# Patient Record
Sex: Male | Born: 1955 | Race: White | Hispanic: No | Smoking: Former smoker
Health system: Southern US, Community
[De-identification: ages and names within clinical notes are randomized; demographics above are authoritative.]

## PROBLEM LIST (undated history)

## (undated) DIAGNOSIS — K219 Gastro-esophageal reflux disease without esophagitis: Secondary | ICD-10-CM

## (undated) DIAGNOSIS — IMO0001 Reserved for inherently not codable concepts without codable children: Secondary | ICD-10-CM

## (undated) DIAGNOSIS — R6521 Severe sepsis with septic shock: Secondary | ICD-10-CM

## (undated) DIAGNOSIS — I509 Heart failure, unspecified: Secondary | ICD-10-CM

## (undated) DIAGNOSIS — I482 Chronic atrial fibrillation, unspecified: Secondary | ICD-10-CM

## (undated) DIAGNOSIS — A419 Sepsis, unspecified organism: Secondary | ICD-10-CM

## (undated) DIAGNOSIS — I10 Essential (primary) hypertension: Secondary | ICD-10-CM

## (undated) HISTORY — PX: TONSILECTOMY, ADENOIDECTOMY, BILATERAL MYRINGOTOMY AND TUBES: SHX2538

## (undated) HISTORY — DX: Essential (primary) hypertension: I10

## (undated) HISTORY — DX: Chronic atrial fibrillation, unspecified: I48.20

## (undated) HISTORY — PX: KNEE ARTHROSCOPY: SUR90

## (undated) HISTORY — PX: SPLENECTOMY: SUR1306

## (undated) HISTORY — PX: HERNIA REPAIR: SHX51

## (undated) HISTORY — DX: Gastro-esophageal reflux disease without esophagitis: K21.9

---

## 1999-12-18 ENCOUNTER — Ambulatory Visit (HOSPITAL_COMMUNITY): Admission: RE | Admit: 1999-12-18 | Discharge: 1999-12-19 | Payer: Self-pay | Admitting: Otolaryngology

## 2000-02-27 ENCOUNTER — Ambulatory Visit: Admission: RE | Admit: 2000-02-27 | Discharge: 2000-02-27 | Payer: Self-pay | Admitting: Otolaryngology

## 2003-07-02 ENCOUNTER — Encounter: Payer: Self-pay | Admitting: Orthopedic Surgery

## 2003-07-02 ENCOUNTER — Ambulatory Visit (HOSPITAL_COMMUNITY): Admission: RE | Admit: 2003-07-02 | Discharge: 2003-07-02 | Payer: Self-pay | Admitting: Orthopedic Surgery

## 2003-07-06 ENCOUNTER — Ambulatory Visit (HOSPITAL_COMMUNITY): Admission: RE | Admit: 2003-07-06 | Discharge: 2003-07-06 | Payer: Self-pay | Admitting: Orthopedic Surgery

## 2003-07-06 ENCOUNTER — Encounter: Payer: Self-pay | Admitting: Orthopedic Surgery

## 2005-03-20 ENCOUNTER — Encounter: Admission: RE | Admit: 2005-03-20 | Discharge: 2005-03-20 | Payer: Self-pay | Admitting: Internal Medicine

## 2008-09-11 ENCOUNTER — Encounter (HOSPITAL_BASED_OUTPATIENT_CLINIC_OR_DEPARTMENT_OTHER): Admission: RE | Admit: 2008-09-11 | Discharge: 2008-11-06 | Payer: Self-pay | Admitting: Internal Medicine

## 2009-03-28 ENCOUNTER — Encounter (HOSPITAL_BASED_OUTPATIENT_CLINIC_OR_DEPARTMENT_OTHER): Admission: RE | Admit: 2009-03-28 | Discharge: 2009-05-01 | Payer: Self-pay | Admitting: Internal Medicine

## 2010-10-20 ENCOUNTER — Encounter: Payer: Self-pay | Admitting: Internal Medicine

## 2011-02-11 NOTE — Assessment & Plan Note (Signed)
[facility]   NAME:  Matthew [name], Matthew [name]             ACCOUNT NO.:  [id number]   MEDICAL RECORD NO.:  [id number]      DATE OF BIRTH:  10/15/1955   PHYSICIAN:  Maxwell Caul, M.D. VISIT DATE:  10/12/2008                                   OFFICE VISIT   Mr. Shelp is a gentleman that we have been following for the last  month.  He has a surgical area on the lateral aspect of his right foot  several years ago which apparently was some form of lipoma.  He has  developed some increase in this area recently, which is a very difficult  area as he continues to traumatize this on his footwear.  In any case,  we have had him in a healing sandal with felt offloading strips.  Serial  debridements have resulted in resolution of the wound part of this,  however, he still has a fairly significant deformity.  He also has on  the lateral aspect of his left foot a continued callus area as well.   On examination, he is afebrile.  The area on the lateral and plantar  aspect of his right foot continues to be a source of callus formation  with some subungual hemorrhage.  Nevertheless, after removal of the  callus, there is no open wound here.  Nor on the left side is there any  open wounds remaining.   IMPRESSION:  Pressure ulcer relation related to pressure and friction  over a soft tissue abnormality in his right greater than left foot.  At  this point, I think this boils down to a pressure relief area issue.  I  have written a prescription for custom inserts, although I know the  patient is between jobs and may not be able to afford this.  Other than  offloading, this would felt keeping the area covered with a thickened  adhesive bandage such as Allevyn.  I cannot think of way to prevent the  friction which is no doubt causing this difficult area.  I did write his  prescription today.  At this point, I can see no reason to follow him  here, therefore, I have discharged him.  I am  concerned that if we  cannot properly offload this area, he will have a recurrent problem.           ______________________________  Maxwell Caul, M.D.     MGR/MEDQ  D:  10/12/2008  T:  10/12/2008  Job:  [id number]

## 2011-02-11 NOTE — Assessment & Plan Note (Signed)
[facility]   NAME:  Matthew [name], Matthew [name]             ACCOUNT NO.:  [id number]   MEDICAL RECORD NO.:  [id number]      DATE OF BIRTH:  08/12/1956   PHYSICIAN:  Maxwell Caul, M.D. VISIT DATE:  03/29/2009                                   OFFICE VISIT   HISTORY OF PRESENT ILLNESS:  Mr. Narramore is a 55 year old man who was  initially seen here at the end of 2009.  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  At that point in time, he had been referred by Dr.  Leslee Home for a nonhealing area on the lateral aspect of his right foot  which Dr. Leslee Home had used the term angiolipoma.  In his last round  of his vacations in December 2009 to January 2010, we removed a large  amount of callus from the lateral aspect of his right foot.  The area  responded to simple offloading and really healed over and he was  discharged, I believe in January.  At the time, he had no insurance and  was unemployed, therefore he could not get custom foot wear or custom  inserts.  He is not a diabetic.   He returns today telling me that, roughly 2 months ago, the area in  question started to develop a thickening callus and some drainage but no  pain and he returns today to have our review on this.   PHYSICAL EXAMINATION:  The right lateral plantar aspect of his foot once  again there was a large amount of callus with even a horned appearance  to it.  All of this underwent removal.  After removal of several areas  of thick callus, there is a very small superficial open area and there  was no evidence of infection here.  On the left lateral foot just  proximal to his fifth met head on the plantar aspect was also a thick  area of callus that I removed but there was no open area under this.   IMPRESSION:  Recurrent area of callus/wound secondary to an underlying  defect, prior history of a lipoma removal but this is always  felt as  though there may be a bony abnormality underneath this.  We advised him  to apply Polysporin in the area and use a healing sandal.  This healed  very easily last time.  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  We advised him to offload in his own shoes in  that case.  I have written him another prescription for custom footwear  and/or custom inserts as the technician sees fit to offload this area  when he is up.  I think referring him to Podiatry might be the best  option to see if there is a surgical option for pairing back the  underlying bony deformity which I think exists in his foot or not there  is an underlying lipoma here.  We will see him again in a week for a  quick visit.           ______________________________  Maxwell Caul, M.D.     MGR/MEDQ  D:  03/29/2009  T:  03/29/2009  Job:  [id number]

## 2011-02-11 NOTE — Assessment & Plan Note (Signed)
[facility]   NAME:  Matthew [name], Matthew [name] NO.:  [id number]   MEDICAL RECORD NO.:  [id number]      DATE OF BIRTH:  1955-11-13   PHYSICIAN:  Maxwell Caul, M.D. VISIT DATE:  04/19/2009                                   OFFICE VISIT   HISTORY:  Mr. Zeck is a gentleman who I saw for the first time early  this month, although I had seen him for the same problem in 2009.  He  has a history of a deformity on the right foot with surgery for a benign  tumor roughly 3-4 years ago.  I have also felt he has an underlying bony  deformity in the right plantar foot aspect on the lateral side.  He  presents with a large amount of callus and a wound under this which  responds to debridement of the callus simple offloading and these areas  generally will heal.  On this occasion, it has really been the same as  the first time.   PHYSICAL EXAMINATION:  He is afebrile.  The area of wound is completely  resolved.  There is indeed some soft tissue swelling (history of prior  of lipoma in this area).  However, the area is now completely  epithelialized and resolved.   IMPRESSION:  Recurrent pressure ulcers over the soft tissue and perhaps  bony deformity on the lateral aspect of his right foot.  I have written  him a prescription for diabetic shoes and custom inserts.  He has not  yet obtained these.  I have also suggested a orthopedic surgery  appointment with somebody who has an interest in foot deformities and  apparently he knows Dr. Lajoyce Corners personally and that seemed to be a good  root for this.  Other than that, the wound in this area which is  recurrent has resolved.  We have discussed pressure offloading etc.           ______________________________  Maxwell Caul, M.D.     MGR/MEDQ  D:  04/19/2009  T:  04/20/2009  Job:  [id number]

## 2011-02-11 NOTE — Assessment & Plan Note (Signed)
[facility]   NAME:  Matthew [name], Matthew [name] NO.:  [id number]   MEDICAL RECORD NO.:  [id number]      DATE OF BIRTH:  [birth year]   PHYSICIAN:  Maxwell Caul, M.D. VISIT DATE:  09/28/2008                                   OFFICE VISIT   Matthew [name] is a gentleman who we saw on September 14, 2008, as a new  patient.  He has had a surgical area on lateral aspect of his right foot  3 years ago for what the patient thinks was a benign tumor.  Three  months ago, he developed an opening in this area.  He was initially seen  by Dr. Simonne Come.  Debridement of the area showed a large cavitation in  the wound.  The area was cultured.  Crystal analysis showed  pyrophosphate crystals.  Anti-inflammatories actually made the area feel  somewhat better.  The culture grew rare staph aureus.   When we saw this last week, there was a large amount of redundant soft  tissue callus and soft tissue that was removed.  There was still a small  open area, which I think was the remnant of the original wound dealt  with by Dr. Simonne Come.  When I saw that, there was no evidence of  infection and really nothing needed culturing.  Plain x-rays had been  previously done by Dr. Simonne Come showed no bony involvement.   Actually, we applied hydrogel gauze to this, Telfa, and put him in the  healing sandal with self-offloading strips.  He has done well.  The area  feels better.   The area was debrided of some adherent callus.  Underneath, there was  full epithelialization except for 1 tiny area in the inferior recess of  the wound but even this looks healthy.  I also pared back a callus from  a similar area on his left foot.  There is no open wound here.   IMPRESSION:  Pressure ulceration related to pressure and friction over a  soft tissue abnormality in his right foot.  This has almost resolved.  I  continued with the hydrogel and a healing sandal, but I suspect this  will be  resolved by the time he gets here in the next 2 weeks.           ______________________________  Maxwell Caul, M.D.     MGR/MEDQ  D:  09/28/2008  T:  09/29/2008  Job:  [id number]

## 2011-02-11 NOTE — Assessment & Plan Note (Signed)
[facility]   NAME:  Matthew [name], Matthew [name]             ACCOUNT NO.:  [id number]   MEDICAL RECORD NO.:  [id number]      DATE OF BIRTH:  [birth year]   PHYSICIAN:  Maxwell Caul, M.D. VISIT DATE:  04/05/2009                                   OFFICE VISIT   HISTORY:  Matthew [name] is a gentleman I saw for the first time last week,  although I had seen him in 2009.  He has a history of a deformity on the  right foot for a benign tumor roughly 3-4 years ago.  I have also felt  he has an underlying bony deformity in the right foot plantar aspect on  the lateral side.  In Matthew last round of visits in 2009 to the early  parts of 2010, we had removed a large amount of callus from the lateral  aspect of Matthew foot over this deformity and the area responded to simple  offloading and really healed without too much difficulty.  At that time,  he had no insurance and was unemployed, therefore he could not get  custom footwear or custom inserts.  He is not a diabetic.   He returned last week having wounds open in the same area.  Again, he  had a large amount of thick callus and some mild drainage but no pain.  After extensive manual debridement of the callus, we are able to define  a very small wound dimension.  There was no evidence of infection.   He tells me that the wound itself feels better and it is less painful.  He has noted some swelling that he attributes to a possible allergic  reaction to the tape he was using.   PHYSICAL EXAMINATION:  He is afebrile.  Wound measures 0.7 x 0.9 x 0.1.  The whole area looks entirely better with simple offloading.  There is  indeed some swelling on the lateral aspect of Matthew foot but there is no  tenderness, no warmth, and no evidence of infection.  No cultures were  done.   IMPRESSION:  Recurrent pressure ulcers over a soft tissue and bony  prominence on the lateral aspect of Matthew right foot.  I have given him  prescriptions for custom-made  footwear which I think he will need to go  ahead and obtain.  I think ultimately he needs a consultation with  either an orthopedic surgeon who is interested in this type of  preventative surgery and/or a podiatrist to consider surgical options to  prevent recurrent ulcerations.  Of most eminent concern is that he is  taking Matthew [name] to [place] at the end of this month and will be on  Matthew feet for a large period of time.  I am hopeful he will be able to  get Matthew custom shoes to offload this area before he goes on this trip.  He apparently knows Dr. Lajoyce Corners personally who has done some surgery on him  previously on Matthew knee.  Therefore, that might be the best way to get a  surgeon who might be interested in looking at this deformity.  We will  see him again in 2 weeks.           ______________________________  Maxwell Caul, M.D.  MGR/MEDQ  D:  04/05/2009  T:  04/06/2009  Job:  [id number]

## 2011-02-11 NOTE — Assessment & Plan Note (Signed)
[facility]   NAME:  Matthew, [name]             ACCOUNT NO.:  [id number]   MEDICAL RECORD NO.:  [id number]      DATE OF BIRTH:  [birth year]   PHYSICIAN:  Maxwell Caul, M.D. VISIT DATE:  09/14/2008                                   OFFICE VISIT   Matthew [name] is a gentleman here referred by Dr. Simonne [name] for our review  of a nonhealing area on the lateral aspect of his right foot.   The patient tells me that he had foot surgery 3 years ago for a benign  tumor that was removed by a podiatrist some 3 years ago.  The wound  healed well and he really did not have any problems until roughly 2  years ago he started to develop the same swelling in the lateral aspect  of his right foot.  He did not go to see about it.  Two to three months  ago, he noted blood coming out of the area and he went to see his  primary doctor, Matthew [name] who referred him for Matthew [name]  review.   In Matthew [name] office, the callus was debrided down to a normal  skin.  A large cavitation was noted in the area; however, there was no  active bleeding and no spontaneous drainage.  The area was cultured and  was sent for crystals.  The crystal analysis showed calcium  pyrophosphate crystals.  He was put on anti-inflammatories and actually  the whole area actually feels somewhat better.  The culture of the wound  grew rare Staph aureus.   PAST MEDICAL HISTORY:  1. 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  3. Abdominal hernia repair.  4. Left wrist surgery secondary to a ganglion cyst.  5. Nasal septum repair.  6. Hypertension.  7. Sleep apnea.  8. Remote history of gout.  He is not a diabetic.   Current medications are reviewed.   ALLERGIES:  NEOMYCIN/NEOSPORIN.   Examination of the foot continues to reveal a large amount of callus on  the lateral aspect of the foot.  There was a moderate amount of  subungual  hemorrhage within the callus.  There is a soft, fleshy  deformity on the lateral aspect of the right foot.  I could not really  delineate a mass.  Peripheral pulses were intact bilaterally.  There was  no evidence of active arthritis in the foot.  No focal tenderness  related to pseudo gout.   The area was totally debrided of a large amount of redundant callus and  soft tissue.  After this, there is a small open area under the callus  which I am assuming is considerably better than what Dr. Simonne [name]  described on August 08, 2008.  There was no evidence of infection.  Nothing needed culturing.   IMPRESSION:  Pressure ulceration probably related to pressure and  friction within his footwear.  There is a deformity on his foot whether  this is the recurrence of his tumor or not I am uncertain.  However,  there is a deformity but it does not appear to be bony.  Plain x-rays  were done by Dr. Simonne [name] that showed no bony  involvement in this area  and no calcified deposits.  I think the area that is in question will  heal if the area is properly off-loaded.  Towards this end, I applied  hydrogel, Telfa and a healing sandal with felt offloading strips.  Whether he would be a candidate for any further foot surgery to correct  the deformity in this area, I am really uncertain.  I also manually  debrided in his left foot almost in a similar aspect.  I think this was  a callus, there was no open wound.   Overall, I am cautiously optimistic we can get this area to heal if the  pressure is removed.  He is short of funds currently, therefore,  Polysporin, a thick Band-Aid with our healing sandal would probably  suffice and cost him less than the wound care products we applied today.  We will see him again in 2 weeks.           ______________________________  Maxwell Caul, M.D.     MGR/MEDQ  D:  09/14/2008  T:  09/15/2008  Job:  [id number]

## 2011-02-14 NOTE — Op Note (Signed)
NAMENICCOLAS, LOEPER                       ACCOUNT NO.:  [id number]   MEDICAL RECORD NO.:  [id number]                   PATIENT TYPE:  OIB   LOCATION:  2873                                 FACILITY:  [facility]   PHYSICIAN:  Nadara Mustard, M.D.                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   POSTOPERATIVE DIAGNOSIS:  Degenerative medial meniscal tear left knee.  Osteochondral defect left medial femoral condyle.   PROCEDURE:  Left knee arthroscopy with debridement of osteochondral defect  medial femoral condyle with microfracture and partial medial meniscectomies.   SURGEON:  Nadara Mustard, M.D.   ANESTHESIA:  Knee block plus a limited LMA.   ESTIMATED BLOOD LOSS:  Minimal.   TOURNIQUET TIME:  None.   DISPOSITION:  To PACU in stable condition.   INDICATIONS FOR PROCEDURE:  The patient is a 55 year old gentleman with  mechanical symptoms and pain over the left knee medial joint line.  The  patient has failed conservative care including activity modifications,  injections, and presents at this time for arthroscopic intervention. MRI  scan confirmed an osteochondral defect, but the MRI scan did not show a  medial meniscal tear.  The risks and benefits of surgery were discussed with  the patient including infection, neurovascular injury, exacerbation of the  sleep apnea.  The patient states he understands and wishes to proceed at  this time.   DESCRIPTION OF PROCEDURE:  The patient was brought to OR room #3 after  undergoing a knee block.  After adequate levels of anesthesia was obtained,  the patient was placed on the operating table and his left lower extremity  was prepped using Duraprep and draped into a sterile field.  The scope was  inserted through the inferior lateral portal  and a working portal was  established inferomedially.  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 The patient had no osteochondral  defect of the patella and trochlea.  Visualization of the lateral joint line  showed no meniscal pathology and no pathology of the lateral femoral condyle  or lateral tibial plateau.  The meniscus was probed and was stable.  Visualization of the notch showed an intact ACL.  Visualization of the  medial joint line with valgus stress showed the patient had a very large  degenerative tear of the medial meniscus as well as an osteochondral defect  of the medial femoral condyle.  The medial meniscus was partially debrided  back to a stable edge.  The osteochondral defect of the medial femoral  condyle underwent abrasion chondroplasty with microfracture.  A survey was  performed of all compartments after arthroscopy.  There was no loose bodies  and no floating fragments.  The instruments were removed.  The portals were  injected with a total of 20 mL of 0.5% Marcaine plain with 4 mm of morphine.  The portals were closed using 2-0 nylon. The wounds were covered with  Adaptic, orthopedic sponges, sterile Coban.  The patient was extubated from  the LMA which was placed temporarily for pain control and he was  taken to PACU in stable condition.  Plan for discharge to home if the  patient has no difficulty with his oxygen saturations while in the recovery  room.  If the patient does, we will plan for a 23-hour observation.  The  patient is very resistant to 23-hour observation at this time.                                               Nadara Mustard, M.D.    MVD/MEDQ  D:  07/06/2003  T:  07/07/2003  Job:  [id number]

## 2011-02-14 NOTE — Op Note (Signed)
[place]  Patient:    TICO, CROTTEAU                      MRN: [id number] Proc. Date: 12/18/99 Adm. Date:  [id number] Attending:  Fernande Boyden CC:         Gloris Manchester. Lazarus Salines, M.D.             Barbette Hair. Vaughan Basta., M.D.             [facility].                           Operative Report  PREOPERATIVE DIAGNOSES:  Nasal septal deviation.  Hypertrophic inferior turbinates. Obstructive sleep apnea.  POSTOPERATIVE DIAGNOSES:  Nasal septal deviation.  Hypertrophic inferior turbinates. Obstructive sleep apnea.  PROCEDURES PERFORMED:  Septoplasty; bilateral submucous resections, inferior turbinates.  SURGEON:  Gloris Manchester. Lazarus Salines, M.D.  ANESTHESIA:  General orotracheal.  BLOOD LOSS:  Minimal.  COMPLICATIONS:  None.  FINDINGS:  Overall narrow nose, externally and internally, with a rightward septal deviation and moderately enlarged inferior turbinates.  Slight excoriation of the anterior septal mucosa including thinning along the prominent left maxillary crest spur.  DESCRIPTION OF PROCEDURE:  With the patient in a comfortable supine position, general orotracheal anesthesia was induced without difficulty.  At an appropriate level, the patient was placed in a slight beach chair position. Saline-moistened throat pack was placed.  Nasal vibrissae were trimmed.  Cocaine crystals, 200 mg total, were applied on cotton carriers to the anterior ethmoid and sphenopalatine ganglion regions on both sides.  Cocaine solution, 160 mg total, was applied on  1/2 x 3-inch cottonoids to both sides of the nasal septum.  Finally, 1% Xylocaine with 1:100,000 epinephrine, 8 cc total, were infiltrated in the submucoperichondrial plane of the septum on both sides, into the membranous columella, into the anterior floor of the nose and into the nasal spine region.  Approximately 10 minutes were allowed for hemostasis and anesthesia to  take effect. A sterile preparation and draping of the midface was accomplished.  Materials were removed from the nose and observed to be intact and correct in number.  The findings were as described above.  A left-sided hemitransfixion incision was sharply executed and carried down to the caudal edge of the quadrangular cartilage and continued through a floor incision.  A floor tunnel as elevated onto the vomer posteriorly and brought forward along the maxillary crest. Septal tunnel was elevated.  A linear rent was generated in the low septal tunnel where the mucosa was very thin overlying the maxillary crest spur.  The flap was carried back down to the perpendicular plate of the ethmoid, brought downward and communicated with the floor tunnel and then brought forward once again.  The chondro-ethmoid junction was identified and lysed with a caudal elevator and the opposite submucoperiosteal plane was elevated.  The superior perpendicular plate was lysed with an open Jansen-Middleton forceps.  The inferior portion was submucosally dissected and delivered with the caudal elevator, Jansen-Middleton  forceps and Takahashi forceps.  At this point, the septum was still off the maxillary crest towards the right.  A small floor incision was made on the right side of the nose and a floor tunnel elevated, carried back to the vomer and brought forward along the maxillary crest.  The posteroinferior aspect of the quadrangular cartilage was resected and a 1- to 2-mm strip along the  inferior edge where it as heavily spurred was also resected to allow it to trap-door into the midline. The maxillary crest posterior to the quadrangular cartilage was resected using mallet and osteotome.  At this point, the septum was straight and in the midline.  It as secured to the nasal spine region with a figure-of-eight 4-0 PDS suture. Hemostasis was observed.  The right septal flap was entirely intact at  the completion of the procedure.  A linear incision was made in the superior vault f the nose, submucosally on the left side and transmucosally on the right side, to free the septal cartilage from the upper lateral cartilages and allow it to align more straight in the sagittal plane; this completed the septoplasty.  The incisions were closed with interrupted 4-0 chromic gut.  The septum was quilted with a 4-0 plain gut, taking pains to reapproximate the linear rent on the left side.  Just prior to completing the septoplasty, the inferior turbinates were infiltrated with approximately 5 cc total of 1% Xylocaine with 1:100,000 epinephrine. Beginning on the right side, the anterior hood of the inferior turbinate was sharply lysed between the angled valve.  The median mucosa was incised in an anterior upsloping fashion.  The turbinate was in-fractured.  Using angled turbinate scissors, the turbinate bone and lateral mucosa were resected in a down-sloping fashion, taking virtually all the anterior pole and leaving virtually all the posterior pole.  Bony spicules were removed for more prompt healing. The flap was laid back down and suction-coagulated along its length.  The turbinate was out-fractured and this side was completed.  The left side was done in identical  fashion.  Reinforced Silastic splints of 0.040 were fashioned, placing to the nasal septum and secured there too with a 3-0 nylon suture.  Double-thickness Telfa packs coated with Neosporin ointment were placed into the inferior turbinate on each side. Finally, 6.5-mm nasal trumpets were shortened to go to the nasopharynx and were  placed on each side of the nose as part of the nasal packing.  Once again, hemostasis was observed.  At this point, the procedure was completed.  The pharynx was suctioned free and the throat pack was removed.  A drip pad was applied. The patient was returned to anesthesia, awakened,  extubated and transferred to recovery in stable condition.  COMMENT:  [age]-year-old white male with a long history of sleep apnea, worse  with advancing weight and with difficulty tolerating CPAP, at least in part because of very poor nasal airway, although the overall nose was narrow, and anticipate  that he will have improved airway following septoplasty and reduction of turbinates.  Meanwhile, we will use nasal hygiene measures, analgesia, antibiosis. Packs out in two days and nasal splints out in 10 days.  It will probably be two weeks or longer before he can successfully wear CPAP once again.  We will observe him overnight to make sure he has no anesthetic-related complications following  general anesthesia and surgery. DD:  12/18/99 TD:  12/19/99 Job: [id number]

## 2011-11-02 ENCOUNTER — Ambulatory Visit: Payer: Self-pay | Admitting: Physician Assistant

## 2011-11-02 VITALS — BP 130/83 | HR 83 | Temp 98.0°F | Resp 18 | Ht 69.75 in | Wt 366.2 lb

## 2011-11-02 DIAGNOSIS — J3489 Other specified disorders of nose and nasal sinuses: Secondary | ICD-10-CM

## 2011-11-02 DIAGNOSIS — H9209 Otalgia, unspecified ear: Secondary | ICD-10-CM

## 2011-11-02 DIAGNOSIS — H9203 Otalgia, bilateral: Secondary | ICD-10-CM

## 2011-11-02 DIAGNOSIS — I10 Essential (primary) hypertension: Secondary | ICD-10-CM | POA: Insufficient documentation

## 2011-11-02 DIAGNOSIS — I4891 Unspecified atrial fibrillation: Secondary | ICD-10-CM

## 2011-11-02 MED ORDER — IPRATROPIUM BROMIDE 0.06 % NA SOLN: 2.0000 | Freq: Four times a day (QID) | NASAL | Status: DC

## 2011-11-02 MED ORDER — DILTIAZEM HCL ER 240 MG PO CP24: 240.0000 mg | ORAL_CAPSULE | Freq: Every day | ORAL | Status: DC

## 2011-11-02 NOTE — Progress Notes (Signed)
  Subjective:    Patient ID: Matthew [name], male    DOB: 10/12/1955, 56 y.o.   MRN: [id number]  HPI  Matthew [name] presents today c/o sinus and ear pain x 1 week.  Has h/o chronic sinus trouble.  Has seen Kozlow in past but not for 3 years. Has had 2 courses of Amoxicillin since Nov 2012 with some relief.  Not sure if he has an infection at this point but feels like it's coming.  Pt requests refill on Diltiazem for A fib.  Insurance will be in effect in 2 months.  Had lost his job 4 years ago and now has new job. 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 Plans to schedule PE soon.    Review of Systems  Constitutional: Negative for fever and chills.  HENT: Positive for ear pain, nosebleeds, congestion, postnasal drip and sinus pressure. Negative for hearing loss, rhinorrhea, tinnitus and ear discharge.   Eyes: Negative.   Respiratory: Negative for cough, chest tightness and shortness of breath.   Cardiovascular: Positive for palpitations (rate controlled a fib). Negative for chest pain.  Musculoskeletal: Negative for myalgias.  Neurological: Negative for light-headedness and headaches.       Objective:   Physical Exam  Constitutional: Vital signs are normal. He appears well-developed and well-nourished.  HENT:  Right Ear: Tympanic membrane and ear canal normal.  Left Ear: Tympanic membrane and ear canal normal.  Nose: Mucosal edema present. No rhinorrhea.  Mouth/Throat: Oropharynx is clear and moist.  Cardiovascular: Normal rate.  A regularly irregular rhythm present.  Pulmonary/Chest: Effort normal.  Skin: Skin is warm.          Assessment & Plan:   1. Otalgia of both ears    2. Atrial fibrillation  diltiazem (DILACOR XR) 240 MG 24 hr capsule  3. Sinus pain  ipratropium (ATROVENT) 0.06 % nasal spray  4. HTN (hypertension)     Add mucinex, saline Nasal Sprays. Encouraged pt to start exercising by walking briskly for 5 min initially  and increase as tolerated. Plan CPE when insurance available!

## 2011-11-02 NOTE — Patient Instructions (Signed)
Take Mucinex as directed Use Atrovent Nasal Spray, saline nasal spray Start walking!!!!  Call if your sinus symptoms worsen.  We can consider writing another antibiotic. Plan to schedule a physical when you can.

## 2011-11-27 ENCOUNTER — Telehealth: Payer: Self-pay

## 2011-11-27 DIAGNOSIS — I4891 Unspecified atrial fibrillation: Secondary | ICD-10-CM

## 2011-11-27 NOTE — Telephone Encounter (Signed)
PT IS SCHEDULED TO SEE ALICIA ON 12/16/11 FOR A CPE. PT WILL RUN OUT OF MEDS BEFORE THEN AND REQUEST 1 REFILL ON "HEART MEDS" IF RX CAN BE REFILLED PLEASE CALL IN TO [facility] PT CAN BE REACHED ON HIS CELL PHONE @ [phone]

## 2011-11-28 MED ORDER — DILTIAZEM HCL ER 240 MG PO CP24: 240.0000 mg | ORAL_CAPSULE | Freq: Every day | ORAL | Status: DC

## 2011-11-28 NOTE — Telephone Encounter (Signed)
Pt verified that he needs his Diltiazem RFd until appt. He doesn't need any other RFs at this point. Sent RF to [facility]

## 2011-12-16 ENCOUNTER — Encounter: Payer: Self-pay | Admitting: Physician Assistant

## 2011-12-30 ENCOUNTER — Telehealth: Payer: Self-pay

## 2011-12-30 DIAGNOSIS — I4891 Unspecified atrial fibrillation: Secondary | ICD-10-CM

## 2011-12-30 MED ORDER — DILTIAZEM HCL ER 240 MG PO CP24: 240.0000 mg | ORAL_CAPSULE | Freq: Every day | ORAL | Status: DC

## 2011-12-30 NOTE — Telephone Encounter (Signed)
Advised pt that rx was sent in 

## 2011-12-30 NOTE — Telephone Encounter (Signed)
PT. HAS AN APPT. WITH ALICIA ON April 16TH BUT NEEDS A REFILL ON HIS DILTIAZEN UNTIL THEN. HE USES [facility].

## 2011-12-30 NOTE — Telephone Encounter (Signed)
Sent to pharmacy 

## 2012-01-13 ENCOUNTER — Encounter: Payer: Self-pay | Admitting: Physician Assistant

## 2012-01-13 ENCOUNTER — Ambulatory Visit (INDEPENDENT_AMBULATORY_CARE_PROVIDER_SITE_OTHER): Payer: 59 | Admitting: Physician Assistant

## 2012-01-13 VITALS — BP 131/71 | HR 82 | Temp 97.0°F | Resp 20 | Ht 69.0 in | Wt 380.2 lb

## 2012-01-13 DIAGNOSIS — Z23 Encounter for immunization: Secondary | ICD-10-CM

## 2012-01-13 DIAGNOSIS — I4891 Unspecified atrial fibrillation: Secondary | ICD-10-CM

## 2012-01-13 DIAGNOSIS — I10 Essential (primary) hypertension: Secondary | ICD-10-CM

## 2012-01-13 DIAGNOSIS — Z0000 Encounter for general adult medical examination without abnormal findings: Secondary | ICD-10-CM

## 2012-01-13 DIAGNOSIS — E669 Obesity, unspecified: Secondary | ICD-10-CM

## 2012-01-13 LAB — COMPREHENSIVE METABOLIC PANEL
Albumin: 4 g/dL (ref 3.5–5.2)
CO2: 26 mEq/L (ref 19–32)
Calcium: 9.3 mg/dL (ref 8.4–10.5)
Chloride: 106 mEq/L (ref 96–112)
Glucose, Bld: 152 mg/dL — ABNORMAL HIGH (ref 70–99)
Potassium: 3.9 mEq/L (ref 3.5–5.3)
Sodium: 142 mEq/L (ref 135–145)
Total Protein: 7.3 g/dL (ref 6.0–8.3)

## 2012-01-13 LAB — GLUCOSE, POCT (MANUAL RESULT ENTRY): POC Glucose: 161

## 2012-01-13 LAB — PSA: PSA: 0.87 ng/mL (ref <=4.00)

## 2012-01-13 LAB — IFOBT (OCCULT BLOOD): IFOBT: POSITIVE

## 2012-01-13 LAB — POCT GLYCOSYLATED HEMOGLOBIN (HGB A1C): Hemoglobin A1C: 7.8

## 2012-01-13 MED ORDER — TETANUS-DIPHTH-ACELL PERTUSSIS 5-2.5-18.5 LF-MCG/0.5 IM SUSP: 0.5000 mL | Freq: Once | INTRAMUSCULAR | Status: DC

## 2012-01-13 NOTE — Progress Notes (Signed)
Subjective:    Patient ID: Matthew [name], male    DOB: [birth year], 56 y.o.   MRN: [id number]  HPI Matthew [name] is here today for annual exam.  He is a former patient of Dr. Pete Glatter.  He lost his insurance for sometime and was financially strained.  He now has a new job and insurance and wants to get back on track.  No specific concerns today.  He is mainly here to establish.  He has A fib but has not seen a cardiologist for some time.  Dr. Pete Glatter did an EKG last year per pt. He says that he rarely feels an irregular rhythm and has not had a rapid rate episode in years. He is off Coumadin because of the inconvenience. He also has HTN. In the past he has had elevated blood sugars but has never had treatment for DM.  He struggles with his weight and is not exercising because of the obesity.  He gained ~ 100 lbs after divorce in 2003 and has kept it on since.  He stopped drinking ~ 1/12 years ago.  Notes that he has had elevated LFTs for 20 years and has had a full eval. Not clear if he was evaluated for Hepatitis B and C.   Quit smoking No ETOH now but heavy in past. 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 Vital signs are normal. He appears well-developed and well-nourished.  HENT:  Right Ear: Tympanic membrane normal.  Left Ear: Tympanic membrane normal.  Mouth/Throat: Uvula is midline and oropharynx is clear and moist.  Eyes: Conjunctivae, EOM and lids are normal. Pupils are equal, round, and reactive to light.  Neck: Normal range of motion. Carotid bruit is not present. No mass and no thyromegaly present.  Cardiovascular: Normal rate and regular rhythm.   Pulses:      Dorsalis pedis pulses are 1+ on the right side, and 1+ on the left side.  Pulmonary/Chest: Effort normal and breath  sounds normal.  Abdominal: Soft. There is no tenderness. A hernia is present. Hernia confirmed positive in the ventral area.       Large umbilical hernia non tender.  Genitourinary: Prostate normal.  Lymphadenopathy:    He has no cervical adenopathy.  Neurological: He is alert and oriented to person, place, and time. He has normal strength and normal reflexes.  Skin: Skin is warm.       Dusky skin lower legs. Signiciant varicosities  Toenails are long and need to be addressed.  Pt defers.  No ulcers noted on feet.  Psychiatric: He has a normal mood and affect. His behavior is normal.    Results for orders placed in visit on 01/13/12  GLUCOSE, POCT (MANUAL RESULT ENTRY)      Component Value Range   POC Glucose 161    POCT GLYCOSYLATED HEMOGLOBIN (HGB A1C)      Component Value Range   Hemoglobin A1C 7.8    IFOBT (OCCULT BLOOD)      Component Value Range   IFOBT Positive           Assessment & Plan:  Annual Screening Exam HTN Obesity Atrial Fibrillation DM II, new diagnosis Heme + stool Umbilical Hernia   CBC, CMET, Lipid, TSH, PSA Update Tdap Await labs.  Return 1  month for follow up Long discussion re weight loss and exercise.   Refer to cardiology and GI. Needs to have toenails trimmed.   Anticipatory Guidance

## 2012-01-14 LAB — CBC WITH DIFFERENTIAL/PLATELET
Eosinophils Absolute: 0.2 10*3/uL (ref 0.0–0.7)
Hemoglobin: 16.2 g/dL (ref 13.0–17.0)
Lymphs Abs: 2.7 10*3/uL (ref 0.7–4.0)
MCH: 31.5 pg (ref 26.0–34.0)
Monocytes Relative: 9 % (ref 3–12)
Neutro Abs: 5.9 10*3/uL (ref 1.7–7.7)
Neutrophils Relative %: 61 % (ref 43–77)
RBC: 5.14 MIL/uL (ref 4.22–5.81)

## 2012-01-15 ENCOUNTER — Other Ambulatory Visit: Payer: Self-pay | Admitting: Physician Assistant

## 2012-01-15 ENCOUNTER — Telehealth: Payer: Self-pay | Admitting: Family Medicine

## 2012-01-15 DIAGNOSIS — I4891 Unspecified atrial fibrillation: Secondary | ICD-10-CM

## 2012-01-15 DIAGNOSIS — R195 Other fecal abnormalities: Secondary | ICD-10-CM

## 2012-01-15 MED ORDER — METFORMIN HCL 500 MG PO TABS: 500.0000 mg | ORAL_TABLET | Freq: Two times a day (BID) | ORAL | Status: DC

## 2012-01-15 NOTE — Telephone Encounter (Signed)
Spoke with patient and explained to him that he would only be getting rx for metformin and Alecia would follow up with him at his one month check up.  Patient stated that he understood.

## 2012-01-23 NOTE — Telephone Encounter (Signed)
Pt would like Dr Davonna Belling to know that his insurance company says he should use life span mini or abbott diabetic chair. Please contact him is any questions and he wants to know if he will get a rx written for this.

## 2012-01-26 NOTE — Telephone Encounter (Signed)
Pt is aware that we are mailing him the Rxs as requested.

## 2012-01-26 NOTE — Telephone Encounter (Signed)
Pt clarified that he is requesting Rx be mailed to him for glucose monitor. The two preferred brands by his insurance are the Life Span Mini or the Abbott Diabetic Care. He wants Rx mailed to him so that he can take them to pharm and talk w/them about price bf filling Rx.

## 2012-01-26 NOTE — Telephone Encounter (Signed)
I will hand write an RX for both as I can not find either in EPIC and pt may find that having both helps find out what's cheaper etc. I will mail. Contact pt with this info.

## 2012-01-26 NOTE — Telephone Encounter (Signed)
Clarify msg please.  Abbott diabetic chair? I think this is aobut a glucose monitor. We can call in rx for monitor if that's what the msg means.

## 2012-01-28 ENCOUNTER — Other Ambulatory Visit: Payer: Self-pay | Admitting: Physician Assistant

## 2012-02-02 ENCOUNTER — Telehealth: Payer: Self-pay

## 2012-02-02 NOTE — Telephone Encounter (Signed)
Pt states told to call alicia with type of mask rx needed, Resmed ultra morage 2 nasal mask   Best phone [phone]

## 2012-02-02 NOTE — Telephone Encounter (Signed)
Mr. Matthew [name] needs a new mask for CPAP machine.  Can we order through [facility]?

## 2012-02-03 NOTE — Telephone Encounter (Signed)
Spoke with patient order is getting faxed over today to [facility] after Helmut Muster look at patients chart

## 2012-02-04 ENCOUNTER — Other Ambulatory Visit: Payer: Self-pay | Admitting: Physician Assistant

## 2012-02-18 ENCOUNTER — Other Ambulatory Visit: Payer: Self-pay | Admitting: Family Medicine

## 2012-02-27 ENCOUNTER — Other Ambulatory Visit: Payer: Self-pay | Admitting: Physician Assistant

## 2012-03-19 ENCOUNTER — Other Ambulatory Visit: Payer: Self-pay | Admitting: Physician Assistant

## 2012-03-23 ENCOUNTER — Ambulatory Visit (INDEPENDENT_AMBULATORY_CARE_PROVIDER_SITE_OTHER): Payer: 59 | Admitting: Physician Assistant

## 2012-03-23 ENCOUNTER — Encounter: Payer: Self-pay | Admitting: Physician Assistant

## 2012-03-23 VITALS — BP 127/75 | HR 77 | Temp 98.2°F | Resp 18 | Ht 70.0 in | Wt 383.2 lb

## 2012-03-23 DIAGNOSIS — I4891 Unspecified atrial fibrillation: Secondary | ICD-10-CM

## 2012-03-23 DIAGNOSIS — E669 Obesity, unspecified: Secondary | ICD-10-CM

## 2012-03-23 DIAGNOSIS — G4730 Sleep apnea, unspecified: Secondary | ICD-10-CM

## 2012-03-23 DIAGNOSIS — M25549 Pain in joints of unspecified hand: Secondary | ICD-10-CM

## 2012-03-23 DIAGNOSIS — K760 Fatty (change of) liver, not elsewhere classified: Secondary | ICD-10-CM

## 2012-03-23 DIAGNOSIS — E119 Type 2 diabetes mellitus without complications: Secondary | ICD-10-CM

## 2012-03-23 LAB — COMPREHENSIVE METABOLIC PANEL
Alkaline Phosphatase: 87 U/L (ref 39–117)
Creat: 0.57 mg/dL (ref 0.50–1.35)
Glucose, Bld: 134 mg/dL — ABNORMAL HIGH (ref 70–99)
Sodium: 141 mEq/L (ref 135–145)
Total Bilirubin: 0.6 mg/dL (ref 0.3–1.2)
Total Protein: 7 g/dL (ref 6.0–8.3)

## 2012-03-23 LAB — POCT GLYCOSYLATED HEMOGLOBIN (HGB A1C): Hemoglobin A1C: 6.8

## 2012-03-23 MED ORDER — METFORMIN HCL 500 MG PO TABS: 500.0000 mg | ORAL_TABLET | Freq: Two times a day (BID) | ORAL | Status: DC

## 2012-03-23 MED ORDER — TRIAMCINOLONE ACETONIDE 0.1 % EX CREA: TOPICAL_CREAM | Freq: Two times a day (BID) | CUTANEOUS | Status: AC

## 2012-03-23 NOTE — Progress Notes (Signed)
  Subjective:    Patient ID: Matthew [name], male    DOB: [birth year], 56 y.o.   MRN: [id number]  HPI Matthew [name] comes in today for follow up and 2 concerns  He has been checking his BS at home and had readings in the 70s for some time until he was put on Eliquis by Dr. Jacinto Halim.  He then noticed his BS was elevating to 120s at that time.  He did not have any new symptoms but has not seen it change.  He has been working on changing his diet but has not been able to exercise yet.  He had a full cardiac work up with Dr. Jacinto Halim.  He has not been to diabetic teaching classes yet but is willing to go.  He has not had polyuria, polydipsia, HA, dizziness, CP or SOB.  He notices a rash on the distal aspect of his right 2 nd finger.  Itchy.  He has never had it before.  He notes that his left thumb catches when he tries to straighten it out.  He has had similar issues with his left index finger.  He meant to mention it to Dr. Lajoyce Corners when he saw him last month but forgot.  It is painful at times but does not swell and has not been red.  He is scheduled to have a colonoscopy in August with Dr. Loreta Ave.   Review of Systems As noted in HPI, otherwise negative     Objective:   Physical Exam  Vitals reviewed. Constitutional: He appears well-developed and well-nourished.  Cardiovascular: Normal rate and regular rhythm.   Pulmonary/Chest: Effort normal and breath sounds normal.  Musculoskeletal:       Left thumb without tenderness, swelling or erythema.  Palpable "catch" with thumb extension at IP joint  Neurological: He is alert.  Skin: Skin is warm. Rash noted.       Dorsal aspect of distal right index finger with red slightly scabbed scaly vesiculopapular lesions.  Psychiatric: He has a normal mood and affect.      Results for orders placed in visit on 03/23/12  GLUCOSE, POCT (MANUAL RESULT ENTRY)      Component Value Range   POC Glucose 148 (*) 70 - 99 mg/dl  POCT GLYCOSYLATED HEMOGLOBIN (HGB A1C)       Component Value Range   Hemoglobin A1C 6.8         Assessment & Plan:  Dm 2, uncontrolled but improving Atrial Fibrillation Obesity Trigger finger Eczema  CMET TAC 0.1% to finger bid Thumb spica splint to left hand.  To Dr. Lajoyce Corners.  Pt to make appointment. Diabetes teaching class referral. Refill Metformin Recheck 3 months

## 2012-03-25 DIAGNOSIS — E669 Obesity, unspecified: Secondary | ICD-10-CM | POA: Insufficient documentation

## 2012-03-25 DIAGNOSIS — K760 Fatty (change of) liver, not elsewhere classified: Secondary | ICD-10-CM | POA: Insufficient documentation

## 2012-03-25 DIAGNOSIS — I4891 Unspecified atrial fibrillation: Secondary | ICD-10-CM | POA: Insufficient documentation

## 2012-03-25 DIAGNOSIS — E11628 Type 2 diabetes mellitus with other skin complications: Secondary | ICD-10-CM | POA: Insufficient documentation

## 2012-03-25 DIAGNOSIS — G4733 Obstructive sleep apnea (adult) (pediatric): Secondary | ICD-10-CM | POA: Insufficient documentation

## 2012-03-28 ENCOUNTER — Other Ambulatory Visit: Payer: Self-pay | Admitting: Physician Assistant

## 2012-05-10 ENCOUNTER — Ambulatory Visit: Payer: Managed Care, Other (non HMO) | Admitting: Emergency Medicine

## 2012-05-10 VITALS — BP 120/76 | HR 103 | Temp 100.0°F | Resp 16 | Ht 69.75 in | Wt 364.0 lb

## 2012-05-10 DIAGNOSIS — J018 Other acute sinusitis: Secondary | ICD-10-CM

## 2012-05-10 DIAGNOSIS — J0100 Acute maxillary sinusitis, unspecified: Secondary | ICD-10-CM

## 2012-05-10 MED ORDER — PSEUDOEPHEDRINE-GUAIFENESIN ER 60-600 MG PO TB12: 1.0000 | ORAL_TABLET | Freq: Two times a day (BID) | ORAL | Status: AC

## 2012-05-10 MED ORDER — LEVOFLOXACIN 500 MG PO TABS: 500.0000 mg | ORAL_TABLET | Freq: Every day | ORAL | Status: AC

## 2012-05-10 NOTE — Progress Notes (Signed)
Date:  05/10/2012   Name:  Matthew [name]   DOB:  08/27/1956   MRN:  [id number] Gender: male  Age: 56 y.o.  PCP:  No primary provider on file.    Chief Complaint: Abdominal Pain   History of Present Illness:  Matthew [name] is a 56 y.o. pleasant patient who presents with the following:  [facility].  Had intense epigastric pain with augmentin and one bout of diarrhea.   Stopped med.  After felt better resumed it and had same problem.  Now has continued sinus symptoms; drainage, congestion, pressure and cough and fever.  No sputum production, nausea or vomiting.  No diarrhea or abdominal pain.  Patient Active Problem List  Diagnosis  . HTN (hypertension)  . A-fib  . DM type 2 (diabetes mellitus, type 2)  . Obesity  . Sleep apnea  . Fatty liver    Past Medical History  Diagnosis Date  . Atrial fibrillation, chronic   . Hypertension   . GERD (gastroesophageal reflux disease)     Past Surgical History  Procedure Date  . Splenectomy   . Tonsilectomy, adenoidectomy, bilateral myringotomy and tubes   . Knee arthroscopy   . Hernia repair     History  Substance Use Topics  . Smoking status: Former Smoker    Types: Cigarettes    Quit date: 11/01/1974  . Smokeless tobacco: Not on file   Comment: smoked in high school for about a year  . Alcohol Use: No    No family history on file.  Allergies  Allergen Reactions  . Neomycin Rash    Medication list has been reviewed and updated.  Current Outpatient Prescriptions on File Prior to Visit  Medication Sig Dispense Refill  . apixaban (ELIQUIS) 5 MG TABS tablet Take by mouth 2 (two) times daily.      [name] CARDIZEM CD 240 MG 24 hr capsule TAKE (1) CAPSULE DAILY.  30 each  2  . LOTENSIN 40 MG tablet TAKE 1 TABLET ONCE DAILY.  30 each  2  . metFORMIN (GLUCOPHAGE) 500 MG tablet Take 1 tablet (500 mg total) by mouth 2 (two) times daily with a meal.  180 tablet  1  .  omeprazole (PRILOSEC) 20 MG capsule Take 20 mg by mouth daily.      . ONE TOUCH ULTRA TEST test strip CHECK BLOOD SUGAR HOW OFTEN???? WE NEED NEW RX FOR STRIPS AND LANCETSFOR PATIENT.  50 each  1  . triamcinolone cream (KENALOG) 0.1 % Apply topically 2 (two) times daily.  30 g  0  . diltiazem (DILACOR XR) 240 MG 24 hr capsule Take 1 capsule (240 mg total) by mouth daily.  30 capsule  0  . ipratropium (ATROVENT) 0.06 % nasal spray Place 2 sprays into the nose 4 (four) times daily.  15 mL  12   Current Facility-Administered Medications on File Prior to Visit  Medication Dose Route Frequency Provider Last Rate Last Dose  . TDaP (BOOSTRIX) injection 0.5 mL  0.5 mL Intramuscular Once Pattricia Boss, PA-C        Review of Systems:  As per HPI, otherwise negative.    Physical Examination: Filed Vitals:   05/10/12 1041  BP: 120/76  Pulse: 103  Temp: 100 F (37.8 C)  Resp: 16   Filed Vitals:   05/10/12 1041  Height: 5' 9.75" (1.772 m)  Weight: 364 lb (165.109 kg)   Body mass index is  52.60 kg/(m^2). Ideal Body Weight: Weight in (lb) to have BMI = 25: 172.6    GEN: WDWN, NAD, Non-toxic, Alert & Oriented x 3 HEENT: Atraumatic, Normocephalic.  Ears and Nose: No external deformity.  Green nasal and postnasal drainage EXTR: No clubbing/cyanosis/edema NEURO: Normal gait.  PSYCH: Normally interactive. Conversant. Not depressed or anxious appearing.  Calm demeanor.  Abdomen:  Benign Chest CTA BS=  Clear   Assessment and Plan: Sinusitis Follow up as needed  Carmelina Dane, MD

## 2012-05-17 ENCOUNTER — Ambulatory Visit (HOSPITAL_COMMUNITY)
Admission: RE | Admit: 2012-05-17 | Payer: Managed Care, Other (non HMO) | Source: Ambulatory Visit | Admitting: Gastroenterology

## 2012-05-17 ENCOUNTER — Encounter (HOSPITAL_COMMUNITY): Admission: RE | Payer: Self-pay | Source: Ambulatory Visit

## 2012-05-17 SURGERY — COLONOSCOPY
Anesthesia: Moderate Sedation

## 2012-06-15 ENCOUNTER — Ambulatory Visit: Payer: 59 | Admitting: Physician Assistant

## 2012-06-15 ENCOUNTER — Ambulatory Visit: Payer: Self-pay | Admitting: Family Medicine

## 2012-06-16 ENCOUNTER — Other Ambulatory Visit: Payer: Self-pay | Admitting: Physician Assistant

## 2012-06-24 ENCOUNTER — Other Ambulatory Visit: Payer: Self-pay | Admitting: Physician Assistant

## 2012-07-13 ENCOUNTER — Telehealth: Payer: Self-pay

## 2012-07-13 NOTE — Telephone Encounter (Signed)
diltiazim is the name of the med. Please call pt to confirm meds called into [facility] PT [phone]

## 2012-07-13 NOTE — Telephone Encounter (Signed)
Pt had to reschedule his 10/17 appt with Dr. Audria Nine to 10/30 due to work. Will run out of htn meds before this appt, Dialti

## 2012-07-14 MED ORDER — DILTIAZEM HCL ER COATED BEADS 240 MG PO CP24: 240.0000 mg | ORAL_CAPSULE | Freq: Every day | ORAL | Status: DC

## 2012-07-14 NOTE — Telephone Encounter (Signed)
I have sent meds in for him. Called him to advise.

## 2012-07-15 ENCOUNTER — Ambulatory Visit: Payer: Self-pay | Admitting: Family Medicine

## 2012-07-28 ENCOUNTER — Encounter: Payer: Self-pay | Admitting: Family Medicine

## 2012-07-28 ENCOUNTER — Ambulatory Visit (INDEPENDENT_AMBULATORY_CARE_PROVIDER_SITE_OTHER): Payer: Managed Care, Other (non HMO) | Admitting: Family Medicine

## 2012-07-28 VITALS — BP 148/64 | HR 83 | Temp 97.9°F | Resp 18 | Ht 69.0 in | Wt 380.0 lb

## 2012-07-28 DIAGNOSIS — J309 Allergic rhinitis, unspecified: Secondary | ICD-10-CM

## 2012-07-28 DIAGNOSIS — E119 Type 2 diabetes mellitus without complications: Secondary | ICD-10-CM

## 2012-07-28 DIAGNOSIS — Z23 Encounter for immunization: Secondary | ICD-10-CM

## 2012-07-28 DIAGNOSIS — H9201 Otalgia, right ear: Secondary | ICD-10-CM

## 2012-07-28 LAB — POCT GLYCOSYLATED HEMOGLOBIN (HGB A1C): Hemoglobin A1C: 6.5

## 2012-07-28 MED ORDER — DILTIAZEM HCL ER COATED BEADS 240 MG PO CP24: 240.0000 mg | ORAL_CAPSULE | Freq: Every day | ORAL | Status: DC

## 2012-07-28 MED ORDER — METFORMIN HCL 500 MG PO TABS: 500.0000 mg | ORAL_TABLET | Freq: Two times a day (BID) | ORAL | Status: DC

## 2012-07-28 MED ORDER — ANTIPYRINE-BENZOCAINE 5.4-1.4 % OT SOLN: 3.0000 [drp] | OTIC | Status: DC | PRN

## 2012-07-28 NOTE — Patient Instructions (Addendum)
Diabetes and Exercise Regular exercise is important and can help:   Control blood glucose (sugar).  Decrease blood pressure.    Control blood lipids (cholesterol, triglycerides).  Improve overall health. BENEFITS FROM EXERCISE  Improved fitness.  Improved flexibility.  Improved endurance.  Increased bone density.  Weight control.  Increased muscle strength.  Decreased body fat.  Improvement of the body's use of insulin, a hormone.  Increased insulin sensitivity.  Reduction of insulin needs.  Reduced stress and tension.  Helps you feel better. People with diabetes who add exercise to their lifestyle gain additional benefits, including:  Weight loss.  Reduced appetite.  Improvement of the body's use of blood glucose.  Decreased risk factors for heart disease:  Lowering of cholesterol and triglycerides.  Raising the level of good cholesterol (high-density lipoproteins, HDL).  Lowering blood sugar.  Decreased blood pressure. TYPE 1 DIABETES AND EXERCISE  Exercise will usually lower your blood glucose.  If blood glucose is greater than 240 mg/dl, check urine ketones. If ketones are present, do not exercise.  Location of the insulin injection sites may need to be adjusted with exercise. Avoid injecting insulin into areas of the body that will be exercised. For example, avoid injecting insulin into:  The arms when playing tennis.  The legs when jogging. For more information, discuss this with your caregiver.  Keep a record of:  Food intake.  Type and amount of exercise.  Expected peak times of insulin action.  Blood glucose levels. Do this before, during, and after exercise. Review your records with your caregiver. This will help you to develop guidelines for adjusting food intake and insulin amounts.  TYPE 2 DIABETES AND EXERCISE  Regular physical activity can help control blood glucose.  Exercise is important because it may:  Increase the  body's sensitivity to insulin.  Improve blood glucose control.  Exercise reduces the risk of heart disease. It decreases serum cholesterol and triglycerides. It also lowers blood pressure.  Those who take insulin or oral hypoglycemic agents should watch for signs of hypoglycemia. These signs include dizziness, shaking, sweating, chills, and confusion.  Body water is lost during exercise. It must be replaced. This will help to avoid loss of body fluids (dehydration) or heat stroke. Be sure to talk to your caregiver before starting an exercise program to make sure it is safe for you. Remember, any activity is better than none.  Document Released: 12/06/2003 Document Revised: 12/08/2011 Document Reviewed: 03/22/2009 [facility] Patient Information 2013 [place], [place].    For allergies: Try OTC Allegra (Fexofenadine is generic) 180 mg 1 tablet daily.

## 2012-07-31 ENCOUNTER — Encounter: Payer: Self-pay | Admitting: Family Medicine

## 2012-07-31 NOTE — Progress Notes (Signed)
S: Matthew [name] is a 57 y.o. Cauc male who has Type II DM, Obesity and HTN. Cardiology manages Atrial Fib with Eliquis; Matthew [name] thinks this med has caused a mild elevation of fastiing BS (80-90 range up to 125-130). He is compliant with medications w/o episodes of hypoglycemia. Denies any problems with feet. Having some difficulty establishing consistent nutrition and exercise behaviors.   Still has c/o ear discomfort after finishing antibiotic for sinusitis. Denies ear drainage, fever, nasal congestion, rhinorrhea, sore throat or cough.  ROS: Negative for significant weight change, diaphoresis, CP or tightness, SOB, edema, muscle cramps, HA, numbness, weakness or syncope.  OCeasar Mons Vitals:   07/28/12 0852  BP: 148/64                                       Weight unchanged from April  Pulse: 83  Temp: 97.9 F (36.6 C)  Resp: 18   GEN: In NAD; WN,WD. HEENT: [place]/AT; EOMI w/ clear conj/scl; nasal mucosa sl edematous and red; no rhinorrhea. EACs sl erythematous and TMs with scant effusion (R>L). Post ph erythematous. NECK: No LAN. LUNGS: CTA; normal resp rate and effort.  Distal pulses 2+/=. SKIN: W&D.  Lower ext/ feet w/o ulcerations or other lesions. NEURO: A&O x 3; CNs intact. Nonfocal.    Results for orders placed in visit on 07/28/12  POCT GLYCOSYLATED HEMOGLOBIN (HGB A1C)      Component Value Range   Hemoglobin A1C 6.5       A/P: 1. Type II or unspecified type diabetes mellitus without mention of complication Stable on current med; encourage improvement of lifestyle- commit to caloric restrictions/ smaller portions and increase exercise 2-3 days/week  2. Otalgia of right ear  RX: Auralgan otic solution  Use as directed.  3. Allergic rhinitis  Use Atrovent Nasal Spray as prescribed.  4. Need for prophylactic vaccination and inoculation against influenza  Flu vaccine greater than or equal to 3yo preservative free IM

## 2012-09-23 ENCOUNTER — Ambulatory Visit (INDEPENDENT_AMBULATORY_CARE_PROVIDER_SITE_OTHER): Payer: Managed Care, Other (non HMO) | Admitting: Emergency Medicine

## 2012-09-23 ENCOUNTER — Ambulatory Visit: Payer: Managed Care, Other (non HMO)

## 2012-09-23 VITALS — BP 180/60 | HR 84 | Temp 98.7°F | Resp 20 | Ht 69.0 in | Wt 383.4 lb

## 2012-09-23 DIAGNOSIS — F411 Generalized anxiety disorder: Secondary | ICD-10-CM

## 2012-09-23 DIAGNOSIS — R05 Cough: Secondary | ICD-10-CM

## 2012-09-23 DIAGNOSIS — F419 Anxiety disorder, unspecified: Secondary | ICD-10-CM

## 2012-09-23 DIAGNOSIS — R059 Cough, unspecified: Secondary | ICD-10-CM

## 2012-09-23 DIAGNOSIS — R062 Wheezing: Secondary | ICD-10-CM

## 2012-09-23 DIAGNOSIS — R609 Edema, unspecified: Secondary | ICD-10-CM

## 2012-09-23 LAB — BASIC METABOLIC PANEL
BUN: 6 mg/dL (ref 6–23)
CO2: 30 mEq/L (ref 19–32)
Calcium: 9.5 mg/dL (ref 8.4–10.5)
Chloride: 102 mEq/L (ref 96–112)
Creat: 0.68 mg/dL (ref 0.50–1.35)
Glucose, Bld: 135 mg/dL — ABNORMAL HIGH (ref 70–99)
Potassium: 3.8 mEq/L (ref 3.5–5.3)
Sodium: 140 mEq/L (ref 135–145)

## 2012-09-23 LAB — POCT CBC
Granulocyte percent: 64.6 %G (ref 37–80)
HCT, POC: 53.6 % (ref 43.5–53.7)
Hemoglobin: 17.3 g/dL (ref 14.1–18.1)
Lymph, poc: 2.4 (ref 0.6–3.4)
MCH, POC: 31.6 pg — AB (ref 27–31.2)
MCHC: 32.3 g/dL (ref 31.8–35.4)
MCV: 98 fL — AB (ref 80–97)
MID (cbc): 1.1 — AB (ref 0–0.9)
MPV: 9.2 fL (ref 0–99.8)
POC Granulocyte: 6.4 (ref 2–6.9)
POC LYMPH PERCENT: 24 %L (ref 10–50)
POC MID %: 11.4 %M (ref 0–12)
Platelet Count, POC: 431 10*3/uL — AB (ref 142–424)
RBC: 5.47 M/uL (ref 4.69–6.13)
RDW, POC: 16.5 %
WBC: 9.9 10*3/uL (ref 4.6–10.2)

## 2012-09-23 MED ORDER — FUROSEMIDE 40 MG PO TABS: 40.0000 mg | ORAL_TABLET | Freq: Every day | ORAL | Status: DC

## 2012-09-23 MED ORDER — DOXYCYCLINE HYCLATE 100 MG PO CAPS: 100.0000 mg | ORAL_CAPSULE | Freq: Two times a day (BID) | ORAL | Status: DC

## 2012-09-23 MED ORDER — ALBUTEROL SULFATE HFA 108 (90 BASE) MCG/ACT IN AERS: 2.0000 | INHALATION_SPRAY | RESPIRATORY_TRACT | Status: DC | PRN

## 2012-09-23 MED ORDER — LEVALBUTEROL HCL 0.63 MG/3ML IN NEBU: 0.6300 mg | INHALATION_SOLUTION | Freq: Once | RESPIRATORY_TRACT | Status: DC

## 2012-09-23 MED ORDER — ALPRAZOLAM 0.25 MG PO TABS: 0.2500 mg | ORAL_TABLET | Freq: Every evening | ORAL | Status: DC | PRN

## 2012-09-23 NOTE — Progress Notes (Signed)
  Subjective:    Patient ID: Matthew [name], male    DOB: 1956-09-23, 56 y.o.   MRN: [id number]  HPI 56 year old male presents with 4-5 day history of cough, wheezing, and slight slight postnasal drainage. Has history of atrial fibrillation managed by Dr. Jacinto Halim who saw him about 6 months ago.  He had an appointment 2 weeks ago that he called and cancelled. Is scheduled to see him again in January '14.  Admits to slight orthopnea with this illness, as well as severe wheezing.  He has no history of asthma and is not a smoker.  Admits to yearly "bouts of wheezing and bronchitis."  Does say that this time his symptoms did not seem to be as concentrated in his sinuses as they normally are. He has been on amoxicillin for 9 days for a dental procedure.  No known history of CHF or pulmonary HTN.  He has been taking Mucinex which he does admit tends to elevate his BP.  Admits to stress at home - caring for 16 year old grandmother and also 12 year old son with him.  Stress increased at work as well.     Review of Systems  Constitutional: Negative for fever and chills.  HENT: Positive for congestion. Negative for sinus pressure.   Respiratory: Positive for cough, shortness of breath and wheezing.   Cardiovascular: Negative for chest pain.  Gastrointestinal: Negative for nausea, vomiting and abdominal pain.       Objective:   Physical Exam  Constitutional: He is oriented to person, place, and time.  HENT:  Head: Normocephalic and atraumatic.  Right Ear: Hearing, tympanic membrane, external ear and ear canal normal.  Left Ear: Hearing, tympanic membrane, external ear and ear canal normal.  Mouth/Throat: Uvula is midline, oropharynx is clear and moist and mucous membranes are normal.  Eyes: Conjunctivae normal are normal.  Neck: Normal range of motion.  Cardiovascular: Normal rate, regular rhythm and normal heart sounds.   Pulmonary/Chest: Effort normal. No respiratory distress. He has wheezes.    Lymphadenopathy:    He has no cervical adenopathy.  Neurological: He is alert and oriented to person, place, and time.  Psychiatric: He has a normal mood and affect. His behavior is normal. Judgment and thought content normal.      [facility] reading (PRIMARY) by  Dr. Cleta Alberts as increased heart size, increased interstitial markings. Rule out congestive heart failure. Patient reports improvement of symptoms s/p xopenex breathing treatment.     Assessment & Plan:   1. Cough  POCT CBC, DG Chest 2 View, doxycycline (VIBRAMYCIN) 100 MG capsule  2. Wheezing  levalbuterol (XOPENEX) nebulizer solution 0.63 mg, albuterol (PROVENTIL HFA;VENTOLIN HFA) 108 (90 BASE) MCG/ACT inhaler  3. Edema  furosemide (LASIX) 40 MG tablet, Basic metabolic panel, Brain natriuretic peptide  4. Anxiety  ALPRAZolam (XANAX) 0.25 MG tablet   Plan to see Dr. Jacinto Halim tomorrow at 1:15 p.m for further workup of cardiomegaly Start doxycycline bid to cover bronchitis Albuterol q4-6hours prn wheezing Have agreed to a 1 time 30 day supply of xanax - if he needs additional refills, have instructed him that he will need a f/u office visit to discuss treatment options.   RTC or go to ER if symptoms worsen in the interim.

## 2012-09-24 LAB — BRAIN NATRIURETIC PEPTIDE: Brain Natriuretic Peptide: 89.8 pg/mL (ref 0.0–100.0)

## 2012-11-13 ENCOUNTER — Other Ambulatory Visit: Payer: Self-pay | Admitting: Physician Assistant

## 2012-11-26 ENCOUNTER — Ambulatory Visit: Payer: Managed Care, Other (non HMO) | Admitting: Family Medicine

## 2012-12-01 ENCOUNTER — Ambulatory Visit: Payer: Managed Care, Other (non HMO) | Admitting: Family Medicine

## 2012-12-10 ENCOUNTER — Other Ambulatory Visit: Payer: Self-pay | Admitting: Physician Assistant

## 2012-12-22 ENCOUNTER — Ambulatory Visit: Payer: Self-pay | Admitting: Family Medicine

## 2013-01-11 ENCOUNTER — Other Ambulatory Visit: Payer: Self-pay | Admitting: Physician Assistant

## 2013-01-11 NOTE — Telephone Encounter (Signed)
Please call this patient. Albuterol inhaler refilled x 1.  Was due for follow-up of DM, etc in 10/2012.  Please advise patient to schedule appointment with Dr. Audria Nine, or walk in for OV and fasting labs.

## 2013-03-24 ENCOUNTER — Other Ambulatory Visit: Payer: Self-pay | Admitting: Physician Assistant

## 2013-05-05 ENCOUNTER — Other Ambulatory Visit: Payer: Self-pay | Admitting: Physician Assistant

## 2013-05-06 NOTE — Telephone Encounter (Signed)
Needs office visit.

## 2013-10-31 ENCOUNTER — Inpatient Hospital Stay (HOSPITAL_COMMUNITY)
Admission: EM | Admit: 2013-10-31 | Discharge: 2013-11-04 | DRG: 871 | Disposition: A | Discharge status: Home / Self Care | Payer: Managed Care, Other (non HMO) | Attending: Family Medicine | Admitting: Family Medicine

## 2013-10-31 ENCOUNTER — Emergency Department (HOSPITAL_COMMUNITY): Payer: Managed Care, Other (non HMO)

## 2013-10-31 ENCOUNTER — Inpatient Hospital Stay (HOSPITAL_COMMUNITY): Payer: Managed Care, Other (non HMO)

## 2013-10-31 ENCOUNTER — Encounter (HOSPITAL_COMMUNITY): Payer: Self-pay | Admitting: Emergency Medicine

## 2013-10-31 DIAGNOSIS — Z9119 Patient's noncompliance with other medical treatment and regimen: Secondary | ICD-10-CM

## 2013-10-31 DIAGNOSIS — Z91199 Patient's noncompliance with other medical treatment and regimen due to unspecified reason: Secondary | ICD-10-CM

## 2013-10-31 DIAGNOSIS — A419 Sepsis, unspecified organism: Principal | ICD-10-CM

## 2013-10-31 DIAGNOSIS — R6521 Severe sepsis with septic shock: Secondary | ICD-10-CM

## 2013-10-31 DIAGNOSIS — G4730 Sleep apnea, unspecified: Secondary | ICD-10-CM

## 2013-10-31 DIAGNOSIS — F1020 Alcohol dependence, uncomplicated: Secondary | ICD-10-CM

## 2013-10-31 DIAGNOSIS — F1011 Alcohol abuse, in remission: Secondary | ICD-10-CM | POA: Diagnosis present

## 2013-10-31 DIAGNOSIS — K760 Fatty (change of) liver, not elsewhere classified: Secondary | ICD-10-CM | POA: Diagnosis present

## 2013-10-31 DIAGNOSIS — I4891 Unspecified atrial fibrillation: Secondary | ICD-10-CM

## 2013-10-31 DIAGNOSIS — Z6841 Body Mass Index (BMI) 40.0 and over, adult: Secondary | ICD-10-CM

## 2013-10-31 DIAGNOSIS — K219 Gastro-esophageal reflux disease without esophagitis: Secondary | ICD-10-CM | POA: Diagnosis present

## 2013-10-31 DIAGNOSIS — I10 Essential (primary) hypertension: Secondary | ICD-10-CM

## 2013-10-31 DIAGNOSIS — R6520 Severe sepsis without septic shock: Secondary | ICD-10-CM

## 2013-10-31 DIAGNOSIS — Z87891 Personal history of nicotine dependence: Secondary | ICD-10-CM

## 2013-10-31 DIAGNOSIS — L02219 Cutaneous abscess of trunk, unspecified: Secondary | ICD-10-CM

## 2013-10-31 DIAGNOSIS — E11628 Type 2 diabetes mellitus with other skin complications: Secondary | ICD-10-CM | POA: Diagnosis present

## 2013-10-31 DIAGNOSIS — K7689 Other specified diseases of liver: Secondary | ICD-10-CM

## 2013-10-31 DIAGNOSIS — N170 Acute kidney failure with tubular necrosis: Secondary | ICD-10-CM | POA: Diagnosis present

## 2013-10-31 DIAGNOSIS — E669 Obesity, unspecified: Secondary | ICD-10-CM

## 2013-10-31 DIAGNOSIS — F1010 Alcohol abuse, uncomplicated: Secondary | ICD-10-CM

## 2013-10-31 DIAGNOSIS — G4733 Obstructive sleep apnea (adult) (pediatric): Secondary | ICD-10-CM | POA: Diagnosis present

## 2013-10-31 DIAGNOSIS — I509 Heart failure, unspecified: Secondary | ICD-10-CM | POA: Diagnosis present

## 2013-10-31 DIAGNOSIS — E119 Type 2 diabetes mellitus without complications: Secondary | ICD-10-CM

## 2013-10-31 DIAGNOSIS — L03319 Cellulitis of trunk, unspecified: Secondary | ICD-10-CM

## 2013-10-31 LAB — COMPREHENSIVE METABOLIC PANEL
ALBUMIN: 2.3 g/dL — AB (ref 3.5–5.2)
ALT: 67 U/L — ABNORMAL HIGH (ref 0–53)
AST: 135 U/L — AB (ref 0–37)
Alkaline Phosphatase: 160 U/L — ABNORMAL HIGH (ref 39–117)
BILIRUBIN TOTAL: 1.8 mg/dL — AB (ref 0.3–1.2)
BUN: 14 mg/dL (ref 6–23)
CHLORIDE: 99 meq/L (ref 96–112)
CO2: 20 mEq/L (ref 19–32)
Calcium: 7.9 mg/dL — ABNORMAL LOW (ref 8.4–10.5)
Creatinine, Ser: 1.97 mg/dL — ABNORMAL HIGH (ref 0.50–1.35)
GFR calc Af Amer: 42 mL/min — ABNORMAL LOW (ref >90)
GFR calc non Af Amer: 36 mL/min — ABNORMAL LOW (ref >90)
Glucose, Bld: 102 mg/dL — ABNORMAL HIGH (ref 70–99)
Potassium: 4.6 mEq/L (ref 3.7–5.3)
Sodium: 143 mEq/L (ref 137–147)
TOTAL PROTEIN: 7.3 g/dL (ref 6.0–8.3)

## 2013-10-31 LAB — URINALYSIS, ROUTINE W REFLEX MICROSCOPIC
GLUCOSE, UA: NEGATIVE mg/dL
Ketones, ur: 15 mg/dL — AB
Nitrite: NEGATIVE
PH: 5.5 (ref 5.0–8.0)
PROTEIN: 30 mg/dL — AB
Specific Gravity, Urine: 1.022 (ref 1.005–1.030)
Urobilinogen, UA: 1 mg/dL (ref 0.0–1.0)

## 2013-10-31 LAB — RAPID URINE DRUG SCREEN, HOSP PERFORMED
Amphetamines: NOT DETECTED
BARBITURATES: NOT DETECTED
Benzodiazepines: POSITIVE — AB
COCAINE: NOT DETECTED
Opiates: NOT DETECTED
Tetrahydrocannabinol: NOT DETECTED

## 2013-10-31 LAB — CARBOXYHEMOGLOBIN
Carboxyhemoglobin: 1.9 % — ABNORMAL HIGH (ref 0.5–1.5)
Methemoglobin: 1.5 % (ref 0.0–1.5)
O2 Saturation: 59.4 %
TOTAL HEMOGLOBIN: 12.8 g/dL — AB (ref 13.5–18.0)

## 2013-10-31 LAB — CBC WITH DIFFERENTIAL/PLATELET
BASOS ABS: 0.1 10*3/uL (ref 0.0–0.1)
BASOS PCT: 0 % (ref 0–1)
EOS ABS: 0 10*3/uL (ref 0.0–0.7)
Eosinophils Relative: 0 % (ref 0–5)
HCT: 37.9 % — ABNORMAL LOW (ref 39.0–52.0)
Hemoglobin: 13 g/dL (ref 13.0–17.0)
Lymphocytes Relative: 9 % — ABNORMAL LOW (ref 12–46)
Lymphs Abs: 1.4 10*3/uL (ref 0.7–4.0)
MCH: 36.4 pg — AB (ref 26.0–34.0)
MCHC: 34.3 g/dL (ref 30.0–36.0)
MCV: 106.2 fL — ABNORMAL HIGH (ref 78.0–100.0)
MONO ABS: 2.2 10*3/uL — AB (ref 0.1–1.0)
MONOS PCT: 15 % — AB (ref 3–12)
NEUTROS ABS: 11.5 10*3/uL — AB (ref 1.7–7.7)
Neutrophils Relative %: 76 % (ref 43–77)
Platelets: 200 10*3/uL (ref 150–400)
RBC: 3.57 MIL/uL — ABNORMAL LOW (ref 4.22–5.81)
RDW: 16.6 % — AB (ref 11.5–15.5)
WBC: 15.2 10*3/uL — ABNORMAL HIGH (ref 4.0–10.5)

## 2013-10-31 LAB — GLUCOSE, CAPILLARY
GLUCOSE-CAPILLARY: 120 mg/dL — AB (ref 70–99)
Glucose-Capillary: 95 mg/dL (ref 70–99)
Glucose-Capillary: 149 mg/dL — ABNORMAL HIGH (ref 70–99)
Glucose-Capillary: 180 mg/dL — ABNORMAL HIGH (ref 70–99)

## 2013-10-31 LAB — URINE MICROSCOPIC-ADD ON

## 2013-10-31 LAB — PROCALCITONIN: PROCALCITONIN: 0.31 ng/mL

## 2013-10-31 LAB — SEDIMENTATION RATE: SED RATE: 50 mm/h — AB (ref 0–16)

## 2013-10-31 LAB — MRSA PCR SCREENING: MRSA BY PCR: NEGATIVE

## 2013-10-31 LAB — LACTIC ACID, PLASMA: Lactic Acid, Venous: 6.5 mmol/L — ABNORMAL HIGH (ref 0.5–2.2)

## 2013-10-31 LAB — TROPONIN I

## 2013-10-31 LAB — CG4 I-STAT (LACTIC ACID): LACTIC ACID, VENOUS: 8.99 mmol/L — AB (ref 0.5–2.2)

## 2013-10-31 LAB — CORTISOL: Cortisol, Plasma: 10.9 ug/dL

## 2013-10-31 MED ORDER — VANCOMYCIN HCL IN DEXTROSE 1-5 GM/200ML-% IV SOLN: 1000.0000 mg | Freq: Once | INTRAVENOUS | Status: DC

## 2013-10-31 MED ORDER — SODIUM CHLORIDE 0.9 % IV SOLN: 250.0000 mL | INTRAVENOUS | Status: DC | PRN

## 2013-10-31 MED ORDER — SODIUM CHLORIDE 0.9 % IV BOLUS (SEPSIS)
1000.0000 mL | INTRAVENOUS | Status: DC | PRN
  Administered 2013-10-31 (×2): 1000 mL via INTRAVENOUS

## 2013-10-31 MED ORDER — SODIUM CHLORIDE 0.9 % IV BOLUS (SEPSIS): 1000.0000 mL | INTRAVENOUS | Status: DC | PRN

## 2013-10-31 MED ORDER — DOBUTAMINE IN D5W 4-5 MG/ML-% IV SOLN
2.5000 ug/kg/min | INTRAVENOUS | Status: DC
  Filled 2013-10-31: qty 250

## 2013-10-31 MED ORDER — LORAZEPAM 2 MG/ML IJ SOLN
1.0000 mg | INTRAMUSCULAR | Status: DC | PRN
  Administered 2013-10-31: 1 mg via INTRAVENOUS
  Filled 2013-10-31: qty 1

## 2013-10-31 MED ORDER — VITAMIN B-1 100 MG PO TABS
100.0000 mg | ORAL_TABLET | Freq: Every day | ORAL | Status: DC
  Administered 2013-10-31 – 2013-11-01 (×2): 100 mg via ORAL
  Filled 2013-10-31 (×3): qty 1

## 2013-10-31 MED ORDER — DEXMEDETOMIDINE HCL IN NACL 200 MCG/50ML IV SOLN: 0.2000 ug/kg/h | INTRAVENOUS | Status: AC

## 2013-10-31 MED ORDER — HEPARIN SODIUM (PORCINE) 5000 UNIT/ML IJ SOLN
5000.0000 [IU] | Freq: Three times a day (TID) | INTRAMUSCULAR | Status: DC
  Administered 2013-10-31 – 2013-11-04 (×13): 5000 [IU] via SUBCUTANEOUS
  Filled 2013-10-31 (×18): qty 1

## 2013-10-31 MED ORDER — VANCOMYCIN HCL 10 G IV SOLR
1500.0000 mg | Freq: Once | INTRAVENOUS | Status: AC
  Administered 2013-10-31: 1500 mg via INTRAVENOUS
  Filled 2013-10-31: qty 1500

## 2013-10-31 MED ORDER — NOREPINEPHRINE BITARTRATE 1 MG/ML IJ SOLN
2.0000 ug/min | INTRAVENOUS | Status: DC
  Administered 2013-10-31: 10 ug/min via INTRAVENOUS
  Filled 2013-10-31 (×2): qty 16

## 2013-10-31 MED ORDER — SODIUM CHLORIDE 0.9 % IV BOLUS (SEPSIS)
1000.0000 mL | Freq: Once | INTRAVENOUS | Status: AC
  Administered 2013-10-31: 1000 mL via INTRAVENOUS

## 2013-10-31 MED ORDER — PIPERACILLIN-TAZOBACTAM 3.375 G IVPB 30 MIN
3.3750 g | Freq: Once | INTRAVENOUS | Status: AC
  Administered 2013-10-31: 3.375 g via INTRAVENOUS
  Filled 2013-10-31: qty 50

## 2013-10-31 MED ORDER — PHENYLEPHRINE HCL 10 MG/ML IJ SOLN
30.0000 ug/min | INTRAVENOUS | Status: DC
  Administered 2013-10-31: 150 ug/min via INTRAVENOUS
  Filled 2013-10-31 (×2): qty 4

## 2013-10-31 MED ORDER — SODIUM CHLORIDE 0.9 % IV SOLN
0.0300 [IU]/min | INTRAVENOUS | Status: DC
  Administered 2013-10-31: 0.03 [IU]/min via INTRAVENOUS
  Filled 2013-10-31: qty 2.5

## 2013-10-31 MED ORDER — SODIUM CHLORIDE 0.9 % IV SOLN
1000.0000 mL | Freq: Once | INTRAVENOUS | Status: AC
  Administered 2013-10-31: 1000 mL via INTRAVENOUS

## 2013-10-31 MED ORDER — VANCOMYCIN HCL 10 G IV SOLR
1500.0000 mg | Freq: Two times a day (BID) | INTRAVENOUS | Status: DC
  Administered 2013-10-31 – 2013-11-03 (×6): 1500 mg via INTRAVENOUS
  Filled 2013-10-31 (×7): qty 1500

## 2013-10-31 MED ORDER — HYDROCORTISONE SOD SUCCINATE 100 MG IJ SOLR
50.0000 mg | Freq: Four times a day (QID) | INTRAMUSCULAR | Status: DC
  Administered 2013-10-31 – 2013-11-01 (×4): 50 mg via INTRAVENOUS
  Filled 2013-10-31 (×8): qty 1

## 2013-10-31 MED ORDER — VANCOMYCIN HCL IN DEXTROSE 1-5 GM/200ML-% IV SOLN
1000.0000 mg | Freq: Once | INTRAVENOUS | Status: AC
  Administered 2013-10-31: 1000 mg via INTRAVENOUS
  Filled 2013-10-31: qty 200

## 2013-10-31 MED ORDER — DEXTROSE 5 % IV SOLN
30.0000 ug/min | INTRAVENOUS | Status: DC
  Administered 2013-10-31: 60 ug/min via INTRAVENOUS
  Filled 2013-10-31: qty 1

## 2013-10-31 MED ORDER — SODIUM CHLORIDE 0.9 % IV SOLN
1000.0000 mL | INTRAVENOUS | Status: DC
  Administered 2013-10-31 (×3): 1000 mL via INTRAVENOUS

## 2013-10-31 MED ORDER — PIPERACILLIN-TAZOBACTAM 3.375 G IVPB
3.3750 g | Freq: Three times a day (TID) | INTRAVENOUS | Status: DC
  Administered 2013-10-31 – 2013-11-03 (×10): 3.375 g via INTRAVENOUS
  Filled 2013-10-31 (×12): qty 50

## 2013-10-31 MED ORDER — PANTOPRAZOLE SODIUM 40 MG IV SOLR
40.0000 mg | Freq: Every day | INTRAVENOUS | Status: DC
  Administered 2013-10-31 – 2013-11-01 (×2): 40 mg via INTRAVENOUS
  Filled 2013-10-31 (×4): qty 40

## 2013-10-31 MED ORDER — FOLIC ACID 1 MG PO TABS
1.0000 mg | ORAL_TABLET | Freq: Every day | ORAL | Status: DC
  Administered 2013-10-31 – 2013-11-01 (×2): 1 mg via ORAL
  Filled 2013-10-31 (×3): qty 1

## 2013-10-31 MED ORDER — ADULT MULTIVITAMIN W/MINERALS CH
1.0000 | ORAL_TABLET | Freq: Every day | ORAL | Status: DC
  Administered 2013-10-31 – 2013-11-01 (×2): 1 via ORAL
  Filled 2013-10-31 (×3): qty 1

## 2013-10-31 NOTE — H&P (Signed)
Name: Matthew [name] MRN: [id number] DOB: 1955/12/31    ADMISSION DATE:  10/31/2013   REFERRING MD : EDP FPTS declined PRIMARY SERVICE: PCCM  CHIEF COMPLAINT:  shock  BRIEF PATIENT DESCRIPTION:  58 yo active alcoholic, dismissed from [facility] 10/27/13 due to lower abd/panus cellulitis. He left and started consuming pint of ETOH daily.  SIGNIFICANT EVENTS / STUDIES:    LINES / TUBES:   CULTURES: 2/2 bc x 2>> 2/2 uc>>  ANTIBIOTICS: 2/2 vanco>> 2/2 zoysn>>  HISTORY OF PRESENT ILLNESS:  43 yoMO 400 lbs) active alcoholic, dismissed from [facility] 10/27/13 due to lower abd/panus cellulitis. He left and started consuming pint of ETOH daily. He has known Afib but is non compliant with target specific anticoagulants. He has known DM (glucose 102 on admit). His blood pressure was low but he mentation is intact and he is making urine. He has received 2 litres of fluid with sbp now 90(0600 10/31/13). PCCM asked to admit.  PAST MEDICAL HISTORY :  Past Medical History  Diagnosis Date  . Atrial fibrillation, chronic   . Hypertension   . GERD (gastroesophageal reflux disease)    Past Surgical History  Procedure Laterality Date  . Splenectomy    . Tonsilectomy, adenoidectomy, bilateral myringotomy and tubes    . Knee arthroscopy    . Hernia repair     Prior to Admission medications   Medication Sig Start Date End Date Taking? Authorizing Provider  albuterol (PROVENTIL HFA;VENTOLIN HFA) 108 (90 BASE) MCG/ACT inhaler Inhale 2 puffs into the lungs every 4 (four) hours as needed for wheezing or shortness of breath.   Yes Historical Provider, MD  apixaban (ELIQUIS) 5 MG TABS tablet Take 5 mg by mouth 2 (two) times daily.   Yes Historical Provider, MD  benazepril (LOTENSIN) 40 MG tablet Take 1 tablet (40 mg total) by mouth daily. Needs office visit (second notice) 12/10/12  Yes Heather M Marte, PA-C  diltiazem (CARDIZEM CD) 240 MG 24 hr capsule Take 1 capsule (240 mg total)  by mouth daily. 07/28/12  Yes Barton Fanny, MD  furosemide (LASIX) 40 MG tablet Take 1 tablet (40 mg total) by mouth daily. 09/23/12  Yes Heather M Marte, PA-C  metFORMIN (GLUCOPHAGE) 500 MG tablet Take 500 mg by mouth 2 (two) times daily with a meal.   Yes Historical Provider, MD   Allergies  Allergen Reactions  . Augmentin [Amoxicillin-Pot Clavulanate] Nausea Only  . Neomycin Rash    FAMILY HISTORY:  No family history on file. SOCIAL HISTORY:  reports that he quit smoking about 39 years ago. His smoking use included Cigarettes. He smoked 0.00 packs per day. He does not have any smokeless tobacco history on file. He reports that he does not drink alcohol. His drug history is not on file.  REVIEW OF SYSTEMS: 10 point review of system taken, please see HPI for positives and negatives.  SUBJECTIVE:   VITAL SIGNS: Temp:  [99.5 F (37.5 C)] 99.5 F (37.5 C) (02/02 0241) Pulse Rate:  [100-117] 108 (02/02 0530) Resp:  [13-25] 13 (02/02 0530) BP: (76-97)/(30-48) 84/30 mmHg (02/02 0530) SpO2:  [94 %-100 %] 96 % (02/02 0530) HEMODYNAMICS:   VENTILATOR SETTINGS:   INTAKE / OUTPUT: Intake/Output     02/01 0701 - 02/02 0700   I.V. 2500   Total Intake 2500   Net +2500         PHYSICAL EXAMINATION: General:  MO WM NAD Neuro:  Dull affect otherwise intact HEENT:  No JVD/LAN Cardiovascular: HSIR IR Lungs:  Diminished in bases Abdomen:  Obese , umbilical hernia. Panus with edema an induration and several scabbed over ulcers, no frank pus. Musculoskeletal:  Lower ext edema Skin:  Abd as noted  LABS:  CBC  Recent Labs Lab 10/31/13 0320  WBC 15.2*  HGB 13.0  HCT 37.9*  PLT 200   Coag's No results found for this basename: APTT, INR,  in the last 168 hours BMET  Recent Labs Lab 10/31/13 0320  NA 143  K 4.6  CL 99  CO2 20  BUN 14  CREATININE 1.97*  GLUCOSE 102*   Electrolytes  Recent Labs Lab 10/31/13 0320  CALCIUM 7.9*   Sepsis Markers  Recent  Labs Lab 10/31/13 0320 10/31/13 0338  LATICACIDVEN  --  8.99*  PROCALCITON 0.31  --    ABG No results found for this basename: PHART, PCO2ART, PO2ART,  in the last 168 hours Liver Enzymes  Recent Labs Lab 10/31/13 0320  AST 135*  ALT 67*  ALKPHOS 160*  BILITOT 1.8*  ALBUMIN 2.3*   Cardiac Enzymes No results found for this basename: TROPONINI, PROBNP,  in the last 168 hours Glucose No results found for this basename: GLUCAP,  in the last 168 hours  Imaging Dg Chest Port 1 View  10/31/2013   CLINICAL DATA:  Fever.  EXAM: PORTABLE CHEST - 1 VIEW  COMPARISON:  09/23/2012  FINDINGS: Hyperinflated lungs with chronic interstitial coarsening. No asymmetric opacity. Borderline cardiomegaly. No evidence of pleural effusion (although the lateral left costophrenic sulcus is excluded from view). No pneumothorax.  IMPRESSION: Bronchitic changes.  No definitive pneumonia.   Electronically Signed   By: Jorje Guild M.D.   On: 10/31/2013 04:03       ASSESSMENT / PLAN:  PULMONARY A: OSA P:   Nocturnal Cpap  CARDIOVASCULAR A:  Hypotension from presumed sepsis from infected panus Afib P:  Sepsis protocol May need CVL if refractory to fluid resuscitation   RENAL Lab Results  Component Value Date   CREATININE 1.97* 10/31/2013   CREATININE 0.68 09/23/2012   CREATININE 0.57 03/23/2012   CREATININE 0.61 01/13/2012    A: Renal insuff P:   Hydration Follow creatine if does not improve then renal US  GASTROINTESTINAL A:  GI protection       Morbid obesity (380-400 lbs) P:   PPI  HEMATOLOGIC A:  Reported to be on Eliquis for Afib but non compliant P:  Either resume target specific anticoagulant or heparin drip   INFECTIOUS A:  Panus cellulitis P:   See flows May need surgical consult Suggest soft tissue ct abd   ENDOCRINE A:  DM  P:   SSI  NEUROLOGIC A:  ETOH abuse but only drinking for last 5 days after being [facility] P:   Folic acid Thiamine CIWA  protocol Watch for withdrawal.  Richardson Landry Minor ACNP Maryanna Shape PCCM Pager [phone] till 3 pm If no answer page [phone] 10/31/2013, 6:15 AM  TODAY'S SUMMARY:  57 yo active alcoholic, dismissed from [facility] 10/27/13 due to lower abd/panus cellulitis. He left and started consuming pint of ETOH daily. He has known Afib but is non compliant with target specific anticoagulants. He has known DM (glucose 102 on admit). His blood pressure was low but he mentation is intact and he is making urine. He has received 2 litres of fluid with sbp now 90(0600 10/31/13). PCCM asked to admit.  CC time 60 minutes.  Patient seen and examined,  agree with above note.  I dictated the care and orders written for this patient under my direction.  Rush Farmer, MD [phone]

## 2013-10-31 NOTE — Consult Note (Signed)
WOC contacted for discussion for this patient with intertriginous skin damage secondary to candida.  Apparently large fungal overgrowth with candida and odor.  Intertriginous moisture is an issue, ordered Interdry Ag+ antimicrobial wicking fabric.  Jannis Atkins Liane Comber RN,CWOCN [phone]

## 2013-10-31 NOTE — Progress Notes (Signed)
  Echocardiogram 2D Echocardiogram has been performed.  Matthew [name] 10/31/2013, 11:19 AM

## 2013-10-31 NOTE — Progress Notes (Signed)
Family medicine called for admission this am.  Patient's chart examined and patient seen briefly at bedside.  Patient reports a 2 week history of worsening skin infection (began in inguinal area and has spread upwards).  Patient currently in septic shock as he remains hypotensive with MAP of 49 despite fluid resuscitation.  Lactic acid 8.99.  Given persistent hypotension/septic shock, I discussed with CCU physician Dr. Elsworth Soho ([facility]).  ICU to evaluate patient and admit.

## 2013-10-31 NOTE — Procedures (Signed)
PROCEDURE:  Internal jugular central venous catheter, U/S guided.  INDICATION: shock  PROCEDURE OPERATOR: Jerene Pitch, MD  CONSENT: Patient  PROCEDURE SUMMARY:  A time-out was performed. The patient's LEFT neck region was prepped and draped in sterile fashion using chlorhexidine scrub. Anesthesia was achieved with 1% lidocaine. The LEFT internal jugular vein was accessed under ultrasound guidance using a finder needle and sheath. U/S images were permanently documented. Venous blood was withdrawn and the sheath was advanced into the vein and the needle was withdrawn. A guidewire was advanced through the sheath. A small incision was made with a 10 blade scalpel and the sheath was exchanged for a dilator over the guidewire until appropriate dilation was obtained. The dilator was removed and an 8.5 [place] central venous quad-lumen catheter was advanced over the guidewire and secured into place with 4 sutures at <18> cm. At time of procedure completion, all ports aspirated and flushed properly. Post-procedure x-ray shows the tip of the catheter within the superior vena cava.  COMPLICATIONS: None  ESTIMATED BLOOD LOSS:  None  U/S used in placement.  I was present and supervised procedure.  Rush Farmer, M.D. [facility] Pulmonary/Critical Care Medicine. Pager: [phone]. After hours pager: [phone].

## 2013-10-31 NOTE — ED Notes (Signed)
Patient from home, with fever, hypotension and tachycardia.  Patient is CAOx3.  Patient does have untreated yeast infection in the pelvis/groin area.  Patient has excoriation to groin area under panis.  Patient has been at [facility] for ETOH abuse, the asked him to leave due to infection.  Patient is depressed, denies any SI at this time.

## 2013-10-31 NOTE — Progress Notes (Signed)
Discussed heart rate and rhythm with Doctor Nelda Marseille, Per Dr Nelda Marseille, start amiodarone drip if heart rate increases and sustains >140 and patient continues to be in afib with rvr

## 2013-10-31 NOTE — ED Provider Notes (Signed)
CSN: [id number]     Arrival date & time 10/31/13  8101 History   First MD Initiated Contact with Patient 10/31/13 0255     Chief Complaint  Patient presents with  . Fever  . Hypotension   (Consider location/radiation/quality/duration/timing/severity/associated sxs/prior Treatment) Patient is a 58 y.o. male presenting with fever. The history is provided by the patient.  Fever He states that the he didn't feel right in his groin and inguinal area where he has a known [place] infection. He is normally a resident at [facility] but had been kicked out. He denies pain and denies fever or chills. Denies feeling dizzy or lightheaded. He states that when he was evaluated by EMS he was told he had a fever. He also states that he has chronic atrial fibrillation always runs a fast heart rate.  Past Medical History  Diagnosis Date  . Atrial fibrillation, chronic   . Hypertension   . GERD (gastroesophageal reflux disease)    Past Surgical History  Procedure Laterality Date  . Splenectomy    . Tonsilectomy, adenoidectomy, bilateral myringotomy and tubes    . Knee arthroscopy    . Hernia repair     No family history on file. History  Substance Use Topics  . Smoking status: Former Smoker    Types: Cigarettes    Quit date: 11/01/1974  . Smokeless tobacco: Not on file     Comment: smoked in high school for about a year  . Alcohol Use: No    Review of Systems  Constitutional: Positive for fever.  All other systems reviewed and are negative.    Allergies  Augmentin and Neomycin  Home Medications   Current Outpatient Rx  Name  Route  Sig  Dispense  Refill  . ALPRAZolam (XANAX) 0.25 MG tablet   Oral   Take 1 tablet (0.25 mg total) by mouth at bedtime as needed for sleep.   30 tablet   0   . apixaban (ELIQUIS) 5 MG TABS tablet   Oral   Take by mouth 2 (two) times daily.         . benazepril (LOTENSIN) 40 MG tablet   Oral   Take 1 tablet (40 mg total) by mouth daily. Needs  office visit (second notice)   15 tablet   0   . diltiazem (CARDIZEM CD) 240 MG 24 hr capsule   Oral   Take 1 capsule (240 mg total) by mouth daily.   30 capsule   5   . furosemide (LASIX) 40 MG tablet   Oral   Take 1 tablet (40 mg total) by mouth daily.   30 tablet   0   . EXPIRED: ipratropium (ATROVENT) 0.06 % nasal spray   Nasal   Place 2 sprays into the nose 4 (four) times daily.   15 mL   12   . EXPIRED: metFORMIN (GLUCOPHAGE) 500 MG tablet   Oral   Take 1 tablet (500 mg total) by mouth 2 (two) times daily with a meal.   30 tablet   5   . omeprazole (PRILOSEC) 20 MG capsule   Oral   Take 20 mg by mouth daily.         . ONE TOUCH ULTRA TEST test strip      CHECK BLOOD SUGAR HOW OFTEN???? WE NEED NEW RX FOR STRIPS AND LANCETSFOR PATIENT.   50 each   1   . PROAIR HFA 108 (90 BASE) MCG/ACT inhaler  USE 2 PUFFS EVERY 4 HOURS AS NEEDED FOR WHEEZE, COUGH, OR SHORTNESS OF BREATH.   8.5 g   0    BP 93/38  Pulse 117  Temp(Src) 99.5 F (37.5 C) (Rectal)  SpO2 94% Physical Exam  Nursing note and vitals reviewed.  Morbidly obese 58 year old male, resting comfortably and in no acute distress. Vital signs are significant for tachycardia with heart rate 117, and hypertension with blood pressure 93/38. Oxygen saturation is 94%, which is normal. Head is normocephalic and atraumatic. PERRLA, EOMI. Oropharynx is clear. Neck is nontender and supple without adenopathy or JVD. Back is nontender and there is no CVA tenderness. Lungs are clear without rales, wheezes, or rhonchi. Chest is nontender. Heart has regular rate and rhythm without murmur. Abdomen is soft, flat, nontender without masses or hepatosplenomegaly and peristalsis is normoactive. Umbilical hernia is present an easily reducible. There is erythema of the skin of the lower abdomen with some induration and has appearance of cellulitis. Genitalia: Marked erythema throughout the genitalia without any  induration or break in the skin. Extremities have 3+ edema. There is erythema of the skin of the feet and lower legs which is worse on the right than on the left. Appearance is consistent with cellulitis. Skin is warm and dry without other rash. Neurologic: Mental status is normal, cranial nerves are intact, there are no motor or sensory deficits.  ED Course  Procedures (including critical care time) Labs Review Results for orders placed during the hospital encounter of 10/31/13  CULTURE, BLOOD (ROUTINE X 2)      Result Value Range   Specimen Description BLOOD RIGHT HAND     Special Requests BOTTLES DRAWN AEROBIC AND ANAEROBIC 5CC EA     Culture  Setup Time       Value: 10/31/2013 08:47     Performed at [facility]   Culture       Value:        BLOOD CULTURE RECEIVED NO GROWTH TO DATE CULTURE WILL BE HELD FOR 5 DAYS BEFORE ISSUING A FINAL NEGATIVE REPORT     Performed at [facility]   Report Status PENDING    CULTURE, BLOOD (ROUTINE X 2)      Result Value Range   Specimen Description BLOOD RIGHT ANTECUBITAL     Special Requests BOTTLES DRAWN AEROBIC AND ANAEROBIC 5CC EA     Culture  Setup Time       Value: 10/31/2013 08:47     Performed at [facility]   Culture       Value:        BLOOD CULTURE RECEIVED NO GROWTH TO DATE CULTURE WILL BE HELD FOR 5 DAYS BEFORE ISSUING A FINAL NEGATIVE REPORT     Performed at [facility]   Report Status PENDING    URINE CULTURE      Result Value Range   Specimen Description URINE, CATHETERIZED     Special Requests NONE     Culture  Setup Time       Value: 10/31/2013 16:45     Performed at [facility]       Value: NO GROWTH     Performed at [facility]   Culture       Value: NO GROWTH     Performed at [facility]   Report Status 11/01/2013 FINAL    MRSA PCR SCREENING      Result Value Range  MRSA by PCR NEGATIVE  NEGATIVE  CLOSTRIDIUM DIFFICILE BY PCR       Result Value Range   C difficile by pcr NEGATIVE  NEGATIVE  CBC WITH DIFFERENTIAL      Result Value Range   WBC 15.2 (*) 4.0 - 10.5 K/uL   RBC 3.57 (*) 4.22 - 5.81 MIL/uL   Hemoglobin 13.0  13.0 - 17.0 g/dL   HCT 37.9 (*) 39.0 - 52.0 %   MCV 106.2 (*) 78.0 - 100.0 fL   MCH 36.4 (*) 26.0 - 34.0 pg   MCHC 34.3  30.0 - 36.0 g/dL   RDW 16.6 (*) 11.5 - 15.5 %   Platelets 200  150 - 400 K/uL   Neutrophils Relative % 76  43 - 77 %   Neutro Abs 11.5 (*) 1.7 - 7.7 K/uL   Lymphocytes Relative 9 (*) 12 - 46 %   Lymphs Abs 1.4  0.7 - 4.0 K/uL   Monocytes Relative 15 (*) 3 - 12 %   Monocytes Absolute 2.2 (*) 0.1 - 1.0 K/uL   Eosinophils Relative 0  0 - 5 %   Eosinophils Absolute 0.0  0.0 - 0.7 K/uL   Basophils Relative 0  0 - 1 %   Basophils Absolute 0.1  0.0 - 0.1 K/uL  COMPREHENSIVE METABOLIC PANEL      Result Value Range   Sodium 143  137 - 147 mEq/L   Potassium 4.6  3.7 - 5.3 mEq/L   Chloride 99  96 - 112 mEq/L   CO2 20  19 - 32 mEq/L   Glucose, Bld 102 (*) 70 - 99 mg/dL   BUN 14  6 - 23 mg/dL   Creatinine, Ser 1.97 (*) 0.50 - 1.35 mg/dL   Calcium 7.9 (*) 8.4 - 10.5 mg/dL   Total Protein 7.3  6.0 - 8.3 g/dL   Albumin 2.3 (*) 3.5 - 5.2 g/dL   AST 135 (*) 0 - 37 U/L   ALT 67 (*) 0 - 53 U/L   Alkaline Phosphatase 160 (*) 39 - 117 U/L   Total Bilirubin 1.8 (*) 0.3 - 1.2 mg/dL   GFR calc non Af Amer 36 (*) >90 mL/min   GFR calc Af Amer 42 (*) >90 mL/min  URINALYSIS, ROUTINE W REFLEX MICROSCOPIC      Result Value Range   Color, Urine AMBER (*) YELLOW   APPearance CLOUDY (*) CLEAR   Specific Gravity, Urine 1.022  1.005 - 1.030   pH 5.5  5.0 - 8.0   Glucose, UA NEGATIVE  NEGATIVE mg/dL   Hgb urine dipstick TRACE (*) NEGATIVE   Bilirubin Urine SMALL (*) NEGATIVE   Ketones, ur 15 (*) NEGATIVE mg/dL   Protein, ur 30 (*) NEGATIVE mg/dL   Urobilinogen, UA 1.0  0.0 - 1.0 mg/dL   Nitrite NEGATIVE  NEGATIVE   Leukocytes, UA SMALL (*) NEGATIVE  SEDIMENTATION RATE      Result Value Range    Sed Rate 50 (*) 0 - 16 mm/hr  PROCALCITONIN      Result Value Range   Procalcitonin 0.31    CORTISOL      Result Value Range   Cortisol, Plasma 10.9    TROPONIN I      Result Value Range   Troponin I <0.30  <0.30 ng/mL  LACTIC ACID, PLASMA      Result Value Range   Lactic Acid, Venous 6.5 (*) 0.5 - 2.2 mmol/L  URINE RAPID DRUG SCREEN (HOSP PERFORMED)  Result Value Range   Opiates NONE DETECTED  NONE DETECTED   Cocaine NONE DETECTED  NONE DETECTED   Benzodiazepines POSITIVE (*) NONE DETECTED   Amphetamines NONE DETECTED  NONE DETECTED   Tetrahydrocannabinol NONE DETECTED  NONE DETECTED   Barbiturates NONE DETECTED  NONE DETECTED  GLUCOSE, CAPILLARY      Result Value Range   Glucose-Capillary 120 (*) 70 - 99 mg/dL  CARBOXYHEMOGLOBIN      Result Value Range   Total hemoglobin 12.8 (*) 13.5 - 18.0 g/dL   O2 Saturation 59.4     Carboxyhemoglobin 1.9 (*) 0.5 - 1.5 %   Methemoglobin 1.5  0.0 - 1.5 %  URINE MICROSCOPIC-ADD ON      Result Value Range   Squamous Epithelial / LPF FEW (*) RARE   WBC, UA 3-6  <3 WBC/hpf   RBC / HPF 0-2  <3 RBC/hpf   Bacteria, UA FEW (*) RARE   Urine-Other LESS THAN 10 mL OF URINE SUBMITTED    GLUCOSE, CAPILLARY      Result Value Range   Glucose-Capillary 95  70 - 99 mg/dL  GLUCOSE, CAPILLARY      Result Value Range   Glucose-Capillary 149 (*) 70 - 99 mg/dL  GLUCOSE, CAPILLARY      Result Value Range   Glucose-Capillary 180 (*) 70 - 99 mg/dL  GLUCOSE, CAPILLARY      Result Value Range   Glucose-Capillary 137 (*) 70 - 99 mg/dL   Comment 1 Documented in Chart     Comment 2 Notify RN    GLUCOSE, CAPILLARY      Result Value Range   Glucose-Capillary 140 (*) 70 - 99 mg/dL   Comment 1 Documented in Chart     Comment 2 Notify RN    BASIC METABOLIC PANEL      Result Value Range   Sodium 143  137 - 147 mEq/L   Potassium 3.6 (*) 3.7 - 5.3 mEq/L   Chloride 104  96 - 112 mEq/L   CO2 25  19 - 32 mEq/L   Glucose, Bld 135 (*) 70 - 99 mg/dL    BUN 13  6 - 23 mg/dL   Creatinine, Ser 1.20  0.50 - 1.35 mg/dL   Calcium 7.3 (*) 8.4 - 10.5 mg/dL   GFR calc non Af Amer 66 (*) >90 mL/min   GFR calc Af Amer 76 (*) >90 mL/min  CBC      Result Value Range   WBC 14.8 (*) 4.0 - 10.5 K/uL   RBC 3.32 (*) 4.22 - 5.81 MIL/uL   Hemoglobin 12.2 (*) 13.0 - 17.0 g/dL   HCT 35.1 (*) 39.0 - 52.0 %   MCV 105.7 (*) 78.0 - 100.0 fL   MCH 36.7 (*) 26.0 - 34.0 pg   MCHC 34.8  30.0 - 36.0 g/dL   RDW 16.6 (*) 11.5 - 15.5 %   Platelets 145 (*) 150 - 400 K/uL  GLUCOSE, CAPILLARY      Result Value Range   Glucose-Capillary 126 (*) 70 - 99 mg/dL  GLUCOSE, CAPILLARY      Result Value Range   Glucose-Capillary 158 (*) 70 - 99 mg/dL  GLUCOSE, CAPILLARY      Result Value Range   Glucose-Capillary 185 (*) 70 - 99 mg/dL  GLUCOSE, CAPILLARY      Result Value Range   Glucose-Capillary 169 (*) 70 - 99 mg/dL  GLUCOSE, CAPILLARY      Result Value Range   Glucose-Capillary 132 (*)  70 - 99 mg/dL  CBC      Result Value Range   WBC 17.0 (*) 4.0 - 10.5 K/uL   RBC 3.62 (*) 4.22 - 5.81 MIL/uL   Hemoglobin 13.4  13.0 - 17.0 g/dL   HCT 38.4 (*) 39.0 - 52.0 %   MCV 106.1 (*) 78.0 - 100.0 fL   MCH 37.0 (*) 26.0 - 34.0 pg   MCHC 34.9  30.0 - 36.0 g/dL   RDW 16.1 (*) 11.5 - 15.5 %   Platelets 162  150 - 400 K/uL  COMPREHENSIVE METABOLIC PANEL      Result Value Range   Sodium 141  137 - 147 mEq/L   Potassium 3.5 (*) 3.7 - 5.3 mEq/L   Chloride 101  96 - 112 mEq/L   CO2 25  19 - 32 mEq/L   Glucose, Bld 162 (*) 70 - 99 mg/dL   BUN 20  6 - 23 mg/dL   Creatinine, Ser 1.39 (*) 0.50 - 1.35 mg/dL   Calcium 7.1 (*) 8.4 - 10.5 mg/dL   Total Protein 7.6  6.0 - 8.3 g/dL   Albumin 2.3 (*) 3.5 - 5.2 g/dL   AST 117 (*) 0 - 37 U/L   ALT 68 (*) 0 - 53 U/L   Alkaline Phosphatase 148 (*) 39 - 117 U/L   Total Bilirubin 2.1 (*) 0.3 - 1.2 mg/dL   GFR calc non Af Amer 55 (*) >90 mL/min   GFR calc Af Amer 64 (*) >90 mL/min  GLUCOSE, CAPILLARY      Result Value Range    Glucose-Capillary 137 (*) 70 - 99 mg/dL  GLUCOSE, CAPILLARY      Result Value Range   Glucose-Capillary 131 (*) 70 - 99 mg/dL  CG4 I-STAT (LACTIC ACID)      Result Value Range   Lactic Acid, Venous 8.99 (*) 0.5 - 2.2 mmol/L   Imaging Review Dg Chest 2 View  11/02/2013   CLINICAL DATA:  Short of breath, pulmonary edema  EXAM: CHEST  2 VIEW  COMPARISON:  Prior chest x-ray 10/31/2013  FINDINGS: The central line has been removed. Stable cardiomegaly. Slightly improved interstitial edema. Persistent bibasilar atelectasis. Stable cardiomegaly. Trace atherosclerotic calcification in the transverse aorta. Small left pleural effusion. No pneumothorax. No acute osseous abnormality. The lungs are hyperexpanded and there are central bronchitic changes suggesting a background of COPD.  IMPRESSION: 1. Slightly improved interstitial edema. 2. Small left pleural effusion. 3. Persistent bibasilar atelectasis. 4. Background changes suggest underlying COPD.   Electronically Signed   By: Jacqulynn Cadet M.D.   On: 11/02/2013 07:56    EKG Interpretation    Date/Time:  Monday October 31 2013 02:45:34 EST Ventricular Rate:  116 PR Interval:    QRS Duration: 80 QT Interval:  317 QTC Calculation: 440 R Axis:   -73 Text Interpretation:  Atrial fibrillation Left anterior fascicular block Anterior infarct, old Low voltage QRS When compared with ECG of 07/06/2003, Atrial fibrillation has replaced Sinus rhythm Low voltage QRS is now Present Confirmed by [facility]  MD, Valene Villa ([id number]) on 10/31/2013 3:05:37 AM           CRITICAL CARE Performed by: [id number] Total critical care time: 50 minutes Critical care time was exclusive of separately billable procedures and treating other patients. Critical care was necessary to treat or prevent imminent or life-threatening deterioration. Critical care was time spent personally by me on the following activities: development of treatment plan with patient and/or surrogate as well  as  nursing, discussions with consultants, evaluation of patient's response to treatment, examination of patient, obtaining history from patient or surrogate, ordering and performing treatments and interventions, ordering and review of laboratory studies, ordering and review of radiographic studies, pulse oximetry and re-evaluation of patient's condition.  MDM   1. Sepsis   2. Cellulitis and abscess of trunk   3. DM type 2 (diabetes mellitus, type 2)   4. Fatty liver   5. Obesity   6. Septic shock   7. A-fib    Hypotension with reported fever and clinical findings suspicious for cellulitis. He is given an IV fluid bolus and initial workup begun for possible sepsis.  Blood pressure has remained stable in the ED. He is started on antibiotics. Lactic acid is noted to be elevated. Family Practice is consulted for admission.  Delora Fuel, MD [phone] [id number]

## 2013-10-31 NOTE — ED Notes (Signed)
Dr Roxanne Mins given a copy of lactic acid results 8.99

## 2013-10-31 NOTE — Procedures (Signed)
Arterial Catheter Insertion Procedure Note KOBE OFALLON [id number] 1956/08/30  Procedure: Insertion of Arterial Catheter  Indications: Blood pressure monitoring  Procedure Details Consent: Risks of procedure as well as the alternatives and risks of each were explained to the (patient/caregiver).  Consent for procedure obtained. Time Out: Verified patient identification, verified procedure, site/side was marked, verified correct patient position, special equipment/implants available, medications/allergies/relevent history reviewed, required imaging and test results available.  Performed  Maximum sterile technique was used including cap, gloves, gown, hand hygiene and mask. Skin prep: Chlorhexidine; local anesthetic administered 20 gauge catheter was inserted into right radial artery using the Seldinger technique.  Evaluation Blood flow good; BP tracing good. Complications: No apparent complications.   Gonzella Lex 10/31/2013

## 2013-10-31 NOTE — Progress Notes (Signed)
Shock, CVP-12. Coox 59.4, maxed out on Neo, plan to start vaso and potentionally dobutamin, Discussed with Dr Nelda Marseille

## 2013-10-31 NOTE — Progress Notes (Addendum)
ANTIBIOTIC CONSULT NOTE - INITIAL  Pharmacy Consult for Vancocin and Zosyn Indication: rule out sepsis  Allergies  Allergen Reactions  . Augmentin [Amoxicillin-Pot Clavulanate] Nausea Only  . Neomycin Rash    Patient Measurements: Body Weight: 380-400 pounds (estimate)  Vital Signs: Temp: 99.5 F (37.5 C) (02/02 0241) Temp src: Rectal (02/02 0241) BP: 84/30 mmHg (02/02 0530) Pulse Rate: 108 (02/02 0530)  Labs:  Recent Labs  10/31/13 0320  WBC 15.2*  HGB 13.0  PLT 200  CREATININE 1.97*    Microbiology: No results found for this or any previous visit (from the past 720 hour(s)).  Medical History: Past Medical History  Diagnosis Date  . Atrial fibrillation, chronic   . Hypertension   . GERD (gastroesophageal reflux disease)      Assessment: 58yo male c/o fever and tachycardia, found to be hypotensive, has excoriation to groin under panus, MAP low despite fluid boluses, concern for sepsis d/t cellulitis, to begin IV ABX.  Goal of Therapy:  Vancomycin trough level 15-20 mcg/ml  Plan:  Rec'd vanc 1g in ED; will give additional 1.5g for total load of 2500mg  then begin 1500mg  IV Q12H as well as Zosyn 3.375g IV Q8H and monitor CBC, Cx, levels prn.  Wynona Neat, PharmD, BCPS  10/31/2013,6:06 AM

## 2013-10-31 NOTE — Progress Notes (Signed)
RN says aptient with hx of etoh  Plan Mvt Thiamine Folic acid  Dr. Brand Males, M.D., [facility].C.P Pulmonary and Critical Care Medicine Staff Physician [place] [facility] Pager: [phone], If no answer or between  15:00h - 7:00h: call [phone]  10/31/2013 5:40 PM

## 2013-11-01 DIAGNOSIS — I4891 Unspecified atrial fibrillation: Secondary | ICD-10-CM

## 2013-11-01 LAB — BASIC METABOLIC PANEL
BUN: 13 mg/dL (ref 6–23)
CALCIUM: 7.3 mg/dL — AB (ref 8.4–10.5)
CO2: 25 mEq/L (ref 19–32)
CREATININE: 1.2 mg/dL (ref 0.50–1.35)
Chloride: 104 mEq/L (ref 96–112)
GFR calc Af Amer: 76 mL/min — ABNORMAL LOW (ref >90)
GFR, EST NON AFRICAN AMERICAN: 66 mL/min — AB (ref >90)
Glucose, Bld: 135 mg/dL — ABNORMAL HIGH (ref 70–99)
Potassium: 3.6 mEq/L — ABNORMAL LOW (ref 3.7–5.3)
Sodium: 143 mEq/L (ref 137–147)

## 2013-11-01 LAB — URINE CULTURE
CULTURE: NO GROWTH
Colony Count: NO GROWTH

## 2013-11-01 LAB — GLUCOSE, CAPILLARY
GLUCOSE-CAPILLARY: 140 mg/dL — AB (ref 70–99)
GLUCOSE-CAPILLARY: 185 mg/dL — AB (ref 70–99)
Glucose-Capillary: 126 mg/dL — ABNORMAL HIGH (ref 70–99)
Glucose-Capillary: 132 mg/dL — ABNORMAL HIGH (ref 70–99)
Glucose-Capillary: 137 mg/dL — ABNORMAL HIGH (ref 70–99)
Glucose-Capillary: 158 mg/dL — ABNORMAL HIGH (ref 70–99)
Glucose-Capillary: 169 mg/dL — ABNORMAL HIGH (ref 70–99)

## 2013-11-01 LAB — CBC
HCT: 35.1 % — ABNORMAL LOW (ref 39.0–52.0)
Hemoglobin: 12.2 g/dL — ABNORMAL LOW (ref 13.0–17.0)
MCH: 36.7 pg — ABNORMAL HIGH (ref 26.0–34.0)
MCHC: 34.8 g/dL (ref 30.0–36.0)
MCV: 105.7 fL — AB (ref 78.0–100.0)
PLATELETS: 145 10*3/uL — AB (ref 150–400)
RBC: 3.32 MIL/uL — ABNORMAL LOW (ref 4.22–5.81)
RDW: 16.6 % — AB (ref 11.5–15.5)
WBC: 14.8 10*3/uL — AB (ref 4.0–10.5)

## 2013-11-01 LAB — CLOSTRIDIUM DIFFICILE BY PCR: Toxigenic C. Difficile by PCR: NEGATIVE

## 2013-11-01 MED ORDER — DILTIAZEM HCL 30 MG PO TABS
30.0000 mg | ORAL_TABLET | Freq: Four times a day (QID) | ORAL | Status: DC
  Administered 2013-11-01 – 2013-11-04 (×13): 30 mg via ORAL
  Filled 2013-11-01 (×16): qty 1

## 2013-11-01 MED ORDER — HYDROCORTISONE SOD SUCCINATE 100 MG IJ SOLR
25.0000 mg | Freq: Four times a day (QID) | INTRAMUSCULAR | Status: DC
  Administered 2013-11-01 – 2013-11-03 (×8): 25 mg via INTRAVENOUS
  Filled 2013-11-01 (×12): qty 0.5

## 2013-11-01 MED ORDER — INSULIN ASPART 100 UNIT/ML ~~LOC~~ SOLN
0.0000 [IU] | Freq: Three times a day (TID) | SUBCUTANEOUS | Status: DC
  Administered 2013-11-01: 4 [IU] via SUBCUTANEOUS
  Administered 2013-11-02 (×3): 3 [IU] via SUBCUTANEOUS
  Administered 2013-11-03 (×2): 4 [IU] via SUBCUTANEOUS
  Administered 2013-11-04: 3 [IU] via SUBCUTANEOUS

## 2013-11-01 NOTE — Progress Notes (Addendum)
Called to discuss patients eliquis with Dr Einar Gip. Stated he did not know that the patient was an alcoholic. They have not been refilling his medications since July 2014. He also states that the patient is being discharged from the practice as he has been poorly compliant and while in the hospital will need to determine who he is going to follow-up with.  Patient with chads2 score of 2 borderline for anticoagulation and falls risk given alcoholism puts at risk for fall and bleed. We will continue to hold his eliquis at this time given this risk.  Tommi Rumps, MD

## 2013-11-01 NOTE — Progress Notes (Signed)
Name: Matthew [name] MRN: [id number] DOB: Mar 29, 1956    ADMISSION DATE:  10/31/2013 CONSULTATION DATE:  10/31/2013  REFERRING MD :  EDP FPTS declined PRIMARY SERVICE: PCCM  CHIEF COMPLAINT:  shock  BRIEF PATIENT DESCRIPTION: 58 yo active alcoholic, [facility] 10/27/13 due to lower abd/panus cellulitis. He left and started consuming pint of ETOH daily.  SIGNIFICANT EVENTS / STUDIES:  2/2 admitted to ICU 2/2 weaned off pressors 2/3- off pressors, looks amazingly well   LINES / TUBES: 2/2 LIJ >>>plan out 2/3 2/2 Art line >>>plan out 2/3 2/2 PIV >>>  CULTURES: 2/2 bc x 2 >>>  2/2 uc >>> 2/2 c diff >>> 2/2 MRSA >>> negative  ANTIBIOTICS: 2/2 vanco>>  2/2 zoysn>>  SUBJECTIVE: off pressors overnight. Has had diarrhea. States feels improved from yesterday.  VITAL SIGNS: Temp:  [97.8 F (36.6 C)-98.7 F (37.1 C)] 98.6 F (37 C) (02/03 0405) Pulse Rate:  [96-138] 99 (02/03 0700) Resp:  [12-27] 14 (02/03 0700) BP: (73-129)/(26-68) 109/56 mmHg (02/03 0600) SpO2:  [89 %-98 %] 91 % (02/03 0700) Arterial Line BP: (96-166)/(43-83) 113/49 mmHg (02/03 0700) Weight:  [377 lb 10.4 oz (171.3 kg)-385 lb 5.8 oz (174.8 kg)] 385 lb 5.8 oz (174.8 kg) (02/03 0500) HEMODYNAMICS: CVP:  [9 mmHg-13 mmHg] 10 mmHg VENTILATOR SETTINGS:   INTAKE / OUTPUT: Intake/Output     02/02 0701 - 02/03 0700 02/03 0701 - 02/04 0700   I.V. (mL/kg) 3235.3 (18.5)    IV Piggyback 3650    Total Intake(mL/kg) 6885.3 (39.4)    Urine (mL/kg/hr) 6710 (1.6)    Total Output 6710     Net +175.3          Stool Occurrence 10 x      PHYSICAL EXAMINATION: General:  NAD, laying comfortably in bed  Neuro:  Alert, MAE x4 HEENT:  NCAT Cardiovascular:  irr irr, no murmurs apprceiated Lungs:  CTAB, no wheezes noted Abdomen:  Soft, NT, ND, obese Skin:  Panus with edema and induration, one apparent scabbed over ulcer, less tender than yesterday, no drainage  LABS:  CBC  Recent Labs Lab  10/31/13 0320 11/01/13 0537  WBC 15.2* 14.8*  HGB 13.0 12.2*  HCT 37.9* 35.1*  PLT 200 145*   Coag's No results found for this basename: APTT, INR,  in the last 168 hours BMET  Recent Labs Lab 10/31/13 0320 11/01/13 0537  NA 143 143  K 4.6 3.6*  CL 99 104  CO2 20 25  BUN 14 13  CREATININE 1.97* 1.20  GLUCOSE 102* 135*   Electrolytes  Recent Labs Lab 10/31/13 0320 11/01/13 0537  CALCIUM 7.9* 7.3*   Sepsis Markers  Recent Labs Lab 10/31/13 0320 10/31/13 0338 10/31/13 0750  LATICACIDVEN  --  8.99* 6.5*  PROCALCITON 0.31  --   --    ABG No results found for this basename: PHART, PCO2ART, PO2ART,  in the last 168 hours Liver Enzymes  Recent Labs Lab 10/31/13 0320  AST 135*  ALT 67*  ALKPHOS 160*  BILITOT 1.8*  ALBUMIN 2.3*   Cardiac Enzymes  Recent Labs Lab 10/31/13 0750  TROPONINI <0.30   Glucose  Recent Labs Lab 10/31/13 0821 10/31/13 1143 10/31/13 1551 10/31/13 1909 11/01/13 0016 11/01/13 0404  GLUCAP 120* 95 149* 180* 137* 140*    Imaging Dg Chest Port 1 View  10/31/2013   CLINICAL DATA:  Status post line placement  EXAM: PORTABLE CHEST - 1 VIEW  COMPARISON:  10/31/2013 352 hrs  FINDINGS: A left-sided central venous line is been placed. The catheter tip lies over the expected region of the left innominate vein. No pneumothorax is noted. Cardiac shadow is stable. Mild vascular congestion is seen.  IMPRESSION: Status post central line placement without pneumothorax.  Stable vascular congestion.   Electronically Signed   By: Inez Catalina M.D.   On: 10/31/2013 09:43   Dg Chest Port 1 View  10/31/2013   CLINICAL DATA:  Fever.  EXAM: PORTABLE CHEST - 1 VIEW  COMPARISON:  09/23/2012  FINDINGS: Hyperinflated lungs with chronic interstitial coarsening. No asymmetric opacity. Borderline cardiomegaly. No evidence of pleural effusion (although the lateral left costophrenic sulcus is excluded from view). No pneumothorax.  IMPRESSION: Bronchitic changes.   No definitive pneumonia.   Electronically Signed   By: Jorje Guild M.D.   On: 10/31/2013 04:03    ASSESSMENT / PLAN:  PULMONARY A:OSA P:   Nocturnal CPAP Follow pcxr for edema  CARDIOVASCULAR A: Hypotension from sepsis related to cellulitic panus- improved Afib H/o HTN Met criteria rel AI P:  Dobutamine and levophed weaned off overnight, resume as needed Monitor HR Continue to hold home benazepril and diltiazem until BP remains stable Solu-cortef to be weaned today as off pressors\ Dc lines Last cvp 13, kvo, some concern also edema on pcxr Should we be using eliquis in fall risk etoh?, will cal Dr Einar Gip  RENAL A:  AKI improved, ATN, pre renal P:   Hydrationkvo now Follow Cr in am   GASTROINTESTINAL A:  GI protection P:   PPI diet  HEMATOLOGIC Hemoglobin & Hematocrit     Component Value Date/Time   HGB 12.2* 11/01/2013 0537   HGB 17.3 09/23/2012 1411   HCT 35.1* 11/01/2013 0537   HCT 53.6 09/23/2012 1411   A:  On eliquis for afib but non-compliant and fall risk? Anemia likely dilutional P:  Resume eliquis today after d/w Cards Monitor cbc SCDs ambulate  INFECTIOUS A:  Cellulitis P:   Continue vanc and zosyn until blood cultures negative x48 hours then narrow if neg Monitor cbc  ENDOCRINE CBG (last 3)   Recent Labs  10/31/13 1909 11/01/13 0016 11/01/13 0404  GLUCAP 180* 137* 140*   A:  DM   P:   SSI  NEUROLOGIC A:  ETOH abuse, at risk WD P:   Folic acid Thiamine CIWA protocol to dc if remains as ICU pt  TODAY'S SUMMARY: 58 yo male alcoholic admitted due to lower abdomen/panus cellulits and sepsis. Improved and off pressors this morning. Cultures pending. Consider transfer out of ICU if remains stable off pressors.  Tommi Rumps, MD [facility] PGY-2  I have personally obtained a history, examined the patient, evaluated laboratory and imaging results, formulated the assessment and plan and placed orders.  Lavon Paganini.  Titus Mould, MD, [place] Pgr: [place] [facility]  Pulmonary and [place] Pager: [phone]  11/01/2013, 7:17 AM

## 2013-11-02 ENCOUNTER — Inpatient Hospital Stay (HOSPITAL_COMMUNITY): Payer: Managed Care, Other (non HMO)

## 2013-11-02 DIAGNOSIS — G4730 Sleep apnea, unspecified: Secondary | ICD-10-CM

## 2013-11-02 DIAGNOSIS — F1020 Alcohol dependence, uncomplicated: Secondary | ICD-10-CM

## 2013-11-02 DIAGNOSIS — I10 Essential (primary) hypertension: Secondary | ICD-10-CM

## 2013-11-02 LAB — CBC
HCT: 38.4 % — ABNORMAL LOW (ref 39.0–52.0)
HEMOGLOBIN: 13.4 g/dL (ref 13.0–17.0)
MCH: 37 pg — ABNORMAL HIGH (ref 26.0–34.0)
MCHC: 34.9 g/dL (ref 30.0–36.0)
MCV: 106.1 fL — AB (ref 78.0–100.0)
PLATELETS: 162 10*3/uL (ref 150–400)
RBC: 3.62 MIL/uL — AB (ref 4.22–5.81)
RDW: 16.1 % — ABNORMAL HIGH (ref 11.5–15.5)
WBC: 17 10*3/uL — AB (ref 4.0–10.5)

## 2013-11-02 LAB — COMPREHENSIVE METABOLIC PANEL
ALT: 68 U/L — AB (ref 0–53)
AST: 117 U/L — ABNORMAL HIGH (ref 0–37)
Albumin: 2.3 g/dL — ABNORMAL LOW (ref 3.5–5.2)
Alkaline Phosphatase: 148 U/L — ABNORMAL HIGH (ref 39–117)
BILIRUBIN TOTAL: 2.1 mg/dL — AB (ref 0.3–1.2)
BUN: 20 mg/dL (ref 6–23)
CHLORIDE: 101 meq/L (ref 96–112)
CO2: 25 meq/L (ref 19–32)
Calcium: 7.1 mg/dL — ABNORMAL LOW (ref 8.4–10.5)
Creatinine, Ser: 1.39 mg/dL — ABNORMAL HIGH (ref 0.50–1.35)
GFR calc Af Amer: 64 mL/min — ABNORMAL LOW (ref >90)
GFR, EST NON AFRICAN AMERICAN: 55 mL/min — AB (ref >90)
GLUCOSE: 162 mg/dL — AB (ref 70–99)
Potassium: 3.5 mEq/L — ABNORMAL LOW (ref 3.7–5.3)
SODIUM: 141 meq/L (ref 137–147)
Total Protein: 7.6 g/dL (ref 6.0–8.3)

## 2013-11-02 LAB — GLUCOSE, CAPILLARY
GLUCOSE-CAPILLARY: 166 mg/dL — AB (ref 70–99)
Glucose-Capillary: 137 mg/dL — ABNORMAL HIGH (ref 70–99)
Glucose-Capillary: 131 mg/dL — ABNORMAL HIGH (ref 70–99)
Glucose-Capillary: 124 mg/dL — ABNORMAL HIGH (ref 70–99)

## 2013-11-02 MED ORDER — ADULT MULTIVITAMIN W/MINERALS CH
1.0000 | ORAL_TABLET | Freq: Every day | ORAL | Status: DC
  Administered 2013-11-02 – 2013-11-04 (×3): 1 via ORAL
  Filled 2013-11-02 (×3): qty 1

## 2013-11-02 MED ORDER — PANTOPRAZOLE SODIUM 40 MG PO TBEC
40.0000 mg | DELAYED_RELEASE_TABLET | Freq: Every day | ORAL | Status: DC
  Administered 2013-11-02 – 2013-11-03 (×2): 40 mg via ORAL
  Filled 2013-11-02 (×2): qty 1

## 2013-11-02 MED ORDER — POTASSIUM CHLORIDE CRYS ER 20 MEQ PO TBCR: 20.0000 meq | EXTENDED_RELEASE_TABLET | ORAL | Status: DC

## 2013-11-02 MED ORDER — THIAMINE HCL 100 MG/ML IJ SOLN
100.0000 mg | Freq: Every day | INTRAMUSCULAR | Status: DC
  Filled 2013-11-02: qty 1

## 2013-11-02 MED ORDER — NYSTATIN 100000 UNIT/GM EX POWD
Freq: Two times a day (BID) | CUTANEOUS | Status: DC
  Administered 2013-11-02 – 2013-11-04 (×5): via TOPICAL
  Filled 2013-11-02: qty 15

## 2013-11-02 MED ORDER — FOLIC ACID 1 MG PO TABS
1.0000 mg | ORAL_TABLET | Freq: Every day | ORAL | Status: DC
  Administered 2013-11-02 – 2013-11-04 (×3): 1 mg via ORAL
  Filled 2013-11-02 (×3): qty 1

## 2013-11-02 MED ORDER — VITAMIN B-1 100 MG PO TABS
100.0000 mg | ORAL_TABLET | Freq: Every day | ORAL | Status: DC
  Administered 2013-11-02 – 2013-11-04 (×3): 100 mg via ORAL
  Filled 2013-11-02 (×3): qty 1

## 2013-11-02 MED ORDER — LORAZEPAM 2 MG/ML IJ SOLN
1.0000 mg | Freq: Four times a day (QID) | INTRAMUSCULAR | Status: DC | PRN
Start: 2013-11-02 — End: 2013-11-04
  Administered 2013-11-03: 1 mg via INTRAVENOUS
  Filled 2013-11-02: qty 1

## 2013-11-02 MED ORDER — LORAZEPAM 1 MG PO TABS: 1.0000 mg | ORAL_TABLET | Freq: Four times a day (QID) | ORAL | Status: DC | PRN

## 2013-11-02 MED ORDER — POTASSIUM CHLORIDE CRYS ER 20 MEQ PO TBCR
40.0000 meq | EXTENDED_RELEASE_TABLET | Freq: Once | ORAL | Status: AC
Start: 2013-11-02 — End: 2013-11-02
  Administered 2013-11-02: 40 meq via ORAL
  Filled 2013-11-02: qty 2

## 2013-11-02 NOTE — Progress Notes (Signed)
FMTS Attending Daily Note:  Annabell Sabal MD  [phone] pager  Family Practice pager:  [phone] I have seen and examined this patient and have reviewed their chart. I have discussed this patient with the resident. I agree with the resident's findings, assessment and care plan.  Additionally:  Patient with septic schok, seems to be secondary to panniculitis. Improving clinically.  Stable for transfer to floor.  Order for nightly CPAP placed.  Alveda Reasons, MD 11/02/2013 5:32 PM

## 2013-11-02 NOTE — Progress Notes (Signed)
Patient name: Matthew [name] Medical record number: [id number]  Date of birth: November 08, 1955 Age: 58 y.o. Gender: male   Primary Care Provider: No PCP Per Patient  Consultants: None Code Status: Full Code  Chief Complaint: Septic shock Assessment and Plan:   Matthew [name] is a 58 y.o. male admitted on 10/31/13 with septic shock 2/2 cellulitis . PMH is significant for ETOH abuse, HTN, atrial fibrillation, DM and morbid obesity.   #1 Septic Shock: Likely 2/2 panus cellulitis. Required norepinephrine and dobutamine for pressure support (discontinued on 2/3). Currently with stable blood pressures. Pt received stress-dose steroids of hydrocortisone 25mg  q6hrs. - Steroid taper   #2 Cellulitis: Pt presented with septic shock. Blood cultures currently negative, WBC continues to rise at 17.0. Continues to have redness and peau d'orange skin changes. Likely mixed bacterial and candidal infection.  - Vanc/Zosyn (started 2/2). Continue today. Consider switching to PO regimen tomorrow if pt shows continued clinical improvement. Will likely need total 5-10 day course per IDSA guidelines. - Nystatin powder to intertriginous area BID  - Daily CBC  #3 ETOH abuse: Pt with history of drinking 1 pint to 1 fifth of liquor daily. Had 2 day binge prior to admission. Pt has only required lorazepam x1 since admission - CIWA protocol - Thiamine, folate - Case management for further inpatient treatment if pt is amenable  #4 HTN: Pt hypotensive on admission, has history of atrial fibrillation and HTN. Is prescribed benazepril and diltiazem but reportedly is non-compliant with medications at home. Is prescribed elequis but per ICU team's conversation with outpt provider should likely be discontinued given pt's risk of falls with ETOH abuse. CXR showing possible pulmonary edema. ECHO this admission shows normal systolic function. - Diltiazem 30mg  q6hrs - Consider adding benazepril if pressures rise. Pt is currently  normotensive.  #5 DM: Pt has a history of DM2. On chart review it appears he has been prescribed metformin in the past but is not taking it currently. Last A1c was 6.5 in 2013. - Sliding scale insulin - Check Hgb A1c. Consider restarting metformin if elevated.   #6 OSA: Using CPAP for sleep in ICU.  - CPAP for sleep  FEN/GI: Carb modified diet Prophylaxis:  - Pantoprazole 40mg  QD - Heparin 5000U TID  Disposition: Floor, pending clinical improvement  History of Present Illness: Matthew [name] is a 58 y.o. male transferring from CC/ICU service for sepsis. Patient with PMH significant for CHF, HTN, GERD, DM, EtOH abuse. Patient was previously in alcohol rehab program at [facility] where he was dismissed 1/29 because of worsening cellulitis. Since leaving he relapsed and abused alcohol at home. Night of 2/1 decided he was feeling bad enough that he called EMS and was brought to ED. Says he only felt "off" and "bad" at that time, no specific complaints of increased pain, CP, SOB, fevers, chills, nausea/vomiting. While in the ED he was septic and admitted to ICU.  He has a long history of alcohol abuse, says he was sober for 2 periods of 5 years in 90s and 2 years about 2 years ago. Late 2014 his mom was hospitalized with stroke and he again relapsed. A few family friends had used [employer], [employer] and he decided to try it this past month. He was in for about 9 days before being discharged. While there he was receiving librium protocol for withdrawal (on last day on day of discharge).   Hospital Course: The patient was admitted to the ICU for septic shock where  he received fluid resuscitation, stress dose steroids, and norepinephrine and dobutamine for pressure support. Vanc and zosyn were started for empiric cellulitis treatment. Pressors were discontinued on 2/3 and the patient continued to maintain adequate blood pressures. On 2/4 the patient was felt to be stable to transfer to the  floor.  Review Of Systems: Per HPI with the following additions: None  Otherwise 12 point review of systems was performed and was unremarkable.   Patient Active Problem List    Diagnosis  Date Noted   .  Sepsis  10/31/2013   .  Cellulitis and abscess of trunk  10/31/2013   .  Septic shock  10/31/2013   .  A-fib  03/25/2012   .  DM type 2 (diabetes mellitus, type 2)  03/25/2012   .  Obesity  03/25/2012   .  Sleep apnea  03/25/2012   .  Fatty liver  03/25/2012   .  HTN (hypertension)  11/02/2011    Past Medical History:  Past Medical History   Diagnosis  Date   .  Atrial fibrillation, chronic    .  Hypertension    .  GERD (gastroesophageal reflux disease)     Past Surgical History:  Past Surgical History   Procedure  Laterality  Date   .  Splenectomy     .  Tonsilectomy, adenoidectomy, bilateral myringotomy and tubes     .  Knee arthroscopy     .  Hernia repair      Social History:  History   Substance Use Topics   .  Smoking status:  Former Smoker     Types:  Cigarettes     Quit date:  11/01/1974   .  Smokeless tobacco:  Not on file      Comment: smoked in high school for about a year   .  Alcohol Use:  No    Additional social history: Pt lives with his son. Has had periods of sobriety in the past and reports current relapse occurred after the death of his mother in October 09, 2022. Is motivated to abstain from alcohol. Denies other drug use.  Family History:  No family history on file.   Allergies and Medications:  Allergies   Allergen  Reactions   .  Augmentin [Amoxicillin-Pot Clavulanate]  Nausea Only   .  Neomycin  Rash    No current facility-administered medications on file prior to encounter.    Current Outpatient Prescriptions on File Prior to Encounter   Medication  Sig  Dispense  Refill   .  benazepril (LOTENSIN) 40 MG tablet  Take 1 tablet (40 mg total) by mouth daily. Needs office visit (second notice)  15 tablet  0   .  diltiazem (CARDIZEM CD) 240 MG 24 hr  capsule  Take 1 capsule (240 mg total) by mouth daily.  30 capsule  5   .  furosemide (LASIX) 40 MG tablet  Take 1 tablet (40 mg total) by mouth daily.  30 tablet  0    Objective:  BP 117/70  Pulse 109  Temp(Src) 98.1 F (36.7 C) (Oral)  Resp 23  Ht 5\' 10"  (1.778 m)  Wt 385 lb 5.8 oz (174.8 kg)  BMI 55.29 kg/m2  SpO2 99%   Exam:  General: Obese male lying in hospital bed, alert, in NAD Cardiovascular: Distant heart sounds, regular rate and rhythm, no murmurs/rubs/gallops Respiratory: clear to auscultation bilaterally, no wheezes, crackles or rhonchi Abdomen: Obese abdomen with erythema around panus as noted  below. Soft, nontender, no rebound or guarding, +bs. Umbilical hernia that is soft but no easily reducible Extremities: 1+ pitting edema of LE bilaterally to knees. Stasis skin changes on bilateral lower extremities.  Skin: Large area of erythema with peau d'orange skin changes on abdominal panus. Deep folds of intertriginous groin area appear moist and with red plaques. No abscesses or pustules are noted. Neuro: The patient is alert and oriented. No focal deficits.   Labs and Imaging:  CBC  BMET    Recent Labs    Recent Labs      Lab  11/01/13 0537      NA  143      K  3.6*      CL  104    Lab  11/01/13 0537   CO2  25    WBC  14.8*   BUN  13    HGB  12.2*   CREATININE  1.20    HCT  35.1*   GLUCOSE  135*    PLT  145*   CALCIUM  7.3Britta Mccreedy 11/01/2013, 8:50 PM  MS4, [place] Intern pager: [phone], text pages welcome  Teaching Service Addendum. I have seen and evaluated this pt and agree with medical student Mrs.Tompkins's assessment and plan as is documented on this note.   D. [place] Philippa Sicks, MD Family Medicine  PGY-3

## 2013-11-02 NOTE — Progress Notes (Signed)
[facility] ICU Electrolyte Replacement Protocol  Patient Name: Matthew [name] DOB: 15-Nov-1955 MRN: [id number]  Date of Service  11/02/2013   HPI/Events of Note    Recent Labs Lab 10/31/13 0320 11/01/13 0537  NA 143 143  K 4.6 3.6*  CL 99 104  CO2 20 25  GLUCOSE 102* 135*  BUN 14 13  CREATININE 1.97* 1.20  CALCIUM 7.9* 7.3*    Estimated Creatinine Clearance: 111 ml/min (by C-G formula based on Cr of 1.2).  Intake/Output     02/03 0701 - 02/04 0700   P.O. 960   I.V. (mL/kg) 250 (1.4)   IV Piggyback 1137.5   Total Intake(mL/kg) 2347.5 (13.3)   Urine (mL/kg/hr) 1100 (0.3)   Stool 100 (0)   Total Output 1200   Net +1147.5       Urine Occurrence 1 x    - I/O DETAILED x24h    Total I/O In: 1067.5 [P.O.:480; IV Piggyback:587.5] Out: 425 [Urine:425] - I/O THIS SHIFT    ASSESSMENT   eICURN Interventions  K+ 3.6  Electrolyte protocol criteria met. Value replaced per protocol. MD notified.   ASSESSMENT: MAJOR ELECTROLYTE    Lorene Dy 11/02/2013, 6:20 AM

## 2013-11-02 NOTE — Progress Notes (Signed)
Patient arrived to 6E26 from 9M in a bariatric bed. A&OX4. Assist x1 to chair. No telemetry ordered. Home CPAP at bedside - facilities notified. Pt requesting to have someone review home medications with him - pharmacy tech notified. Patient oriented to unit and surroundings. Call bell within reach. Will monitor.  Matthew [name], Matthew [name]

## 2013-11-03 DIAGNOSIS — F1011 Alcohol abuse, in remission: Secondary | ICD-10-CM | POA: Diagnosis present

## 2013-11-03 DIAGNOSIS — F1010 Alcohol abuse, uncomplicated: Secondary | ICD-10-CM

## 2013-11-03 LAB — CBC
HCT: 38.4 % — ABNORMAL LOW (ref 39.0–52.0)
Hemoglobin: 13.3 g/dL (ref 13.0–17.0)
MCH: 36.8 pg — AB (ref 26.0–34.0)
MCHC: 34.6 g/dL (ref 30.0–36.0)
MCV: 106.4 fL — ABNORMAL HIGH (ref 78.0–100.0)
PLATELETS: 156 10*3/uL (ref 150–400)
RBC: 3.61 MIL/uL — AB (ref 4.22–5.81)
RDW: 16.1 % — ABNORMAL HIGH (ref 11.5–15.5)
WBC: 11.7 10*3/uL — AB (ref 4.0–10.5)

## 2013-11-03 LAB — GLUCOSE, CAPILLARY
GLUCOSE-CAPILLARY: 112 mg/dL — AB (ref 70–99)
GLUCOSE-CAPILLARY: 159 mg/dL — AB (ref 70–99)
Glucose-Capillary: 165 mg/dL — ABNORMAL HIGH (ref 70–99)
Glucose-Capillary: 116 mg/dL — ABNORMAL HIGH (ref 70–99)

## 2013-11-03 LAB — HEMOGLOBIN A1C
HEMOGLOBIN A1C: 5.6 % (ref <5.7)
Mean Plasma Glucose: 114 mg/dL (ref <117)

## 2013-11-03 LAB — HEPATITIS C ANTIBODY (REFLEX): HCV Ab: NEGATIVE

## 2013-11-03 MED ORDER — PREDNISONE 5 MG PO TABS
5.0000 mg | ORAL_TABLET | Freq: Two times a day (BID) | ORAL | Status: DC
Start: 2013-11-04 — End: 2013-11-04
  Filled 2013-11-03 (×2): qty 1

## 2013-11-03 MED ORDER — HYDROCORTISONE SOD SUCCINATE 100 MG IJ SOLR
25.0000 mg | Freq: Two times a day (BID) | INTRAMUSCULAR | Status: AC
  Administered 2013-11-03: 25 mg via INTRAVENOUS
  Filled 2013-11-03: qty 0.5

## 2013-11-03 MED ORDER — SULFAMETHOXAZOLE-TMP DS 800-160 MG PO TABS
2.0000 | ORAL_TABLET | Freq: Two times a day (BID) | ORAL | Status: DC
  Administered 2013-11-03 – 2013-11-04 (×3): 2 via ORAL
  Filled 2013-11-03 (×6): qty 2

## 2013-11-03 NOTE — Progress Notes (Addendum)
Family Medicine Teaching Service Daily Progress Note Intern Pager: [phone]  Patient name: Matthew [name] Medical record number: [id number] Date of birth: 27-Jan-1956 Age: 58 y.o. Gender: male  Primary Care Provider: No PCP Per Patient Consultants: None Code Status: Full Code  Pt Overview and Major Events to Date: Pt presented to the ED on 10/31/13 and found to be in septic shock due to panus cellulitis. Admitted to the ICU for pressure support. Pt improved clinically with fluids and IV antibiotics and was weaned from pressors (dobutamine, norepinephrine) and transferred to the family medicine service on 11/02/13 for further care.   Assessment and Plan:  Matthew [name] is a 58 y.o. male admitted on 10/31/13 with septic shock 2/2 cellulitis . PMH is significant for ETOH abuse, HTN, atrial fibrillation, DM and morbid obesity.   #1 Septic Shock: Likely 2/2 panus cellulitis. Required norepinephrine and dobutamine for pressure support (discontinued on 2/3). Currently with stable blood pressures. Pt received stress-dose steroids of hydrocortisone 25mg  q6hrs.  - Steroid taper   #2 Cellulitis: Pt presented with septic shock. Blood cultures NGTD, WBC decreasing. Continues to have redness and peau d'orange skin changes. Likely mixed bacterial and candidal infection.  - Discontinue Vanc/Zosyn (started 2/2). - Begin Bactrim 2 DS tabs BID x6 days. This will complete a 10 day total course of antibiotics.  - Nystatin powder to intertriginous area BID  - Daily CBC   #3 ETOH abuse: Pt with history of drinking 1 pint to 1 fifth of liquor daily. Had 2 day binge prior to admission. Had CIWA of 0 overnight. Pt reports he wants to return home after discharge but is confident that he can abstain from alcohol. - CIWA protocol  - Thiamine, folate  - Hepatitis panel given persistently elevated LFTs  #4 HTN: Pt hypotensive on admission, has history of atrial fibrillation and HTN. Is prescribed benazepril and  diltiazem but reportedly is non-compliant with medications at home. Is prescribed elequis but per ICU team's conversation with outpt provider should likely be discontinued given pt's risk of falls with ETOH abuse. CXR showing possible pulmonary edema. ECHO this admission shows normal systolic function.  - Diltiazem 30mg  q6hrs  - Consider adding benazepril if pressures rise. Pt is currently normotensive.   #5 DM: Pt has a history of DM2. On chart review it appears he has been prescribed metformin in the past but is not taking it currently. Last A1c was 6.5 in 2013.  - Sliding scale insulin  - Check Hgb A1c.  - Metformin 500mg  BID at discharge   #6 OSA: Using CPAP for sleep in ICU.  - CPAP for sleep   FEN/GI: Carb modified diet   Prophylaxis:  - Pantoprazole 40mg  QD  - Heparin 5000U TID   Disposition: Floor. Discharge pending continued clinical improvement. Pt is requesting short term disability.  - PT/OT consult - Social work consult   Subjective: Pt had episode of confusion overnight, currently resolved. Reports he feels well this morning and denies symptoms of chest pain, fevers, chills, or abdominal pain. Is requesting short term disability following discharge until he fully recovers.    Objective: Temp:  [97.4 F (36.3 C)-98.6 F (37 C)] 97.6 F (36.4 C) (02/05 0823) Pulse Rate:  [82-112] 82 (02/05 0823) Resp:  [17-22] 22 (02/05 0823) BP: (100-129)/(65-77) 129/74 mmHg (02/05 0823) SpO2:  [96 %-100 %] 96 % (02/05 0823) Weight:  [387 lb 12.6 oz (175.899 kg)] 387 lb 12.6 oz (175.899 kg) (02/04 2053)  Physical Exam: General: Obese  male sitting in chair eating breakfast, alert and conversant, in NAD  Cardiovascular: Distant heart sounds, regular rate and rhythm, no murmurs/rubs/gallops  Respiratory: clear to auscultation bilaterally, no wheezes, crackles or rhonchi  Abdomen: Obese abdomen with erythema around panus as noted below. Soft, nontender, no rebound or guarding, +bs.  Umbilical hernia present that is soft but not easily reducible  Extremities: 1+ pitting edema of LE bilaterally to knees. Stasis skin changes on bilateral lower extremities.  Skin: Large area of erythema with peau d'orange skin changes on abdominal panus. Deep folds of intertriginous groin area appear moist and with red plaques. No abscesses or pustules are noted. Scarred ulcer on right lower side of panus. Neuro: The patient is alert and oriented. No focal deficits.    Laboratory:  Recent Labs Lab 10/31/13 0320 11/01/13 0537 11/02/13 0920  WBC 15.2* 14.8* 17.0*  HGB 13.0 12.2* 13.4  HCT 37.9* 35.1* 38.4*  PLT 200 145* 162    Recent Labs Lab 10/31/13 0320 11/01/13 0537 11/02/13 0920  NA 143 143 141  K 4.6 3.6* 3.5*  CL 99 104 101  CO2 20 25 25   BUN 14 13 20   CREATININE 1.97* 1.20 1.39*  CALCIUM 7.9* 7.3* 7.1*  PROT 7.3  --  7.6  BILITOT 1.8*  --  2.1*  ALKPHOS 160*  --  148*  ALT 67*  --  68*  AST 135*  --  117*  GLUCOSE 102* 135* 162*    Britta Mccreedy, Med Student 11/03/2013, 8:49 AM MS4, [place] Intern pager: [phone], text pages welcome  Teaching Service Addendum. I have seen and evaluated this pt and agree with medical student's assessment and plan as it is documented on this note. The pertinents corrections have been edited on this present note.   D. [place] Philippa Sicks, MD Family Medicine  PGY-3

## 2013-11-03 NOTE — Evaluation (Signed)
Physical Therapy Evaluation Patient Details Name: Matthew [name] MRN: [id number] DOB: 09-19-1956 Today's Date: 11/03/2013 Time: 6144-3154 PT Time Calculation (min): 24 min  PT Assessment / Plan / Recommendation History of Present Illness  Matthew [name] is a 58 y.o. male admitted on 10/31/13 with septic shock 2/2 cellulitis . PMH is significant for ETOH abuse, HTN, atrial fibrillation, DM and morbid obesity.   Clinical Impression  Pt adm due to the above. Presents with limitations in functional mobility secondary to deficits listed below. Pt to benefit form skilled PT to address deficits and increase mobility prior to D/C home. Pt reports he will have 24/7 (A) from his son upon D/C. Hopes to return to work ASAP. Patient needs to practice stairs next session if planning to D/C.     PT Assessment  Patient needs continued PT services    Follow Up Recommendations  No PT follow up;Supervision for mobility/OOB    Does the patient have the potential to tolerate intense rehabilitation      Barriers to Discharge        Equipment Recommendations  None recommended by PT    Recommendations for Other Services OT consult   Frequency Min 3X/week    Precautions / Restrictions Precautions Precautions: Fall Precaution Comments: pt reports multiple falls but only when he drinks Restrictions Weight Bearing Restrictions: No   Pertinent Vitals/Pain Stable t/o session. No c/o pain.       Mobility  Bed Mobility Overal bed mobility: Modified Independent General bed mobility comments: effortful for pt Transfers Overall transfer level: Needs assistance Equipment used: None Transfers: Sit to/from Stand Sit to Stand: Supervision;Min guard General transfer comment: initially min guard; when transferring from toilet was supervision for saety and cues for hand placement; transfers are efforful and pt relies on UE support Ambulation/Gait Ambulation/Gait assistance: Supervision Ambulation  Distance (Feet): 80 Feet Assistive device: None Gait Pattern/deviations: Step-through pattern;Decreased stride length;Shuffle;Wide base of support Gait velocity: decreased from baseline per pt Gait velocity interpretation: Below normal speed for age/gender General Gait Details: pt demo +sway at times; no LOB noted; supervision for safety; decreased activity tolerance         PT Diagnosis: Abnormality of gait;Generalized weakness  PT Problem List: Decreased strength;Decreased activity tolerance;Decreased balance;Decreased mobility;Decreased safety awareness;Cardiopulmonary status limiting activity PT Treatment Interventions: DME instruction;Gait training;Stair training;Functional mobility training;Therapeutic activities;Therapeutic exercise;Balance training;Neuromuscular re-education;Patient/family education     PT Goals(Current goals can be found in the care plan section) Acute Rehab PT Goals Patient Stated Goal: to go home tomorrow and gather my thoughts PT Goal Formulation: With patient Time For Goal Achievement: 11/17/13 Potential to Achieve Goals: Good  Visit Information  Last PT Received On: 11/03/13 Assistance Needed: +1 History of Present Illness: Matthew [name] is a 58 y.o. male admitted on 10/31/13 with septic shock 2/2 cellulitis . PMH is significant for ETOH abuse, HTN, atrial fibrillation, DM and morbid obesity.        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     Cognition  Cognition Arousal/Alertness: Awake/alert Behavior During Therapy: WFL for tasks assessed/performed Overall Cognitive Status: Impaired/Different from baseline  Extremity/Trunk Assessment Upper Extremity Assessment Upper Extremity Assessment: Defer to OT evaluation Lower Extremity Assessment Lower Extremity Assessment: Generalized weakness Cervical / Trunk Assessment Cervical / Trunk Assessment: Kyphotic   Balance Balance Overall balance assessment: History of Falls;Needs assistance Sitting-balance support: Feet supported;No upper extremity supported Sitting balance-Leahy Scale: Good Standing balance support: During functional activity;No upper extremity supported Standing balance-Leahy Scale: Fair Standing balance comment: +sway at times  End of Session PT - End of Session Equipment Utilized During Treatment: Gait belt Activity Tolerance: Patient tolerated treatment well Patient left: in chair;with call bell/phone within reach;with chair alarm set Nurse Communication: Mobility status  GP     Gustavus Bryant, [place] 11/03/2013, 4:33 PM

## 2013-11-03 NOTE — Progress Notes (Signed)
Pt mildly confused this am and reports vivid dream of " sliding off the bed after talking to a scientist" and believes himself to be in [place].  Reinforced where pt was and that he was still on the bed. Pt seems to clear after redirection. Assisted to pt to recliner and placed on chair alarm. Dorthey Sawyer, RN

## 2013-11-03 NOTE — Progress Notes (Signed)
FMTS Attending Daily Note:  Annabell Sabal MD  [phone] pager  Family Practice pager:  [phone] I have seen and examined this patient and have reviewed their chart. I have discussed this patient with the resident. I agree with the resident's findings, assessment and care plan.  Additionally:  Vastly improved.  Plan to swtich to PO antibiotics and watch for 24 hours.  If continues to improve, possibly able to DC home tomorrow.  Consult SW for alcohol abuse.  Patient currently motivated for weight loss.    Alveda Reasons, MD 11/03/2013 2:53 PM

## 2013-11-04 LAB — GLUCOSE, CAPILLARY
GLUCOSE-CAPILLARY: 95 mg/dL (ref 70–99)
Glucose-Capillary: 127 mg/dL — ABNORMAL HIGH (ref 70–99)

## 2013-11-04 LAB — CBC
HEMATOCRIT: 36.3 % — AB (ref 39.0–52.0)
Hemoglobin: 12.6 g/dL — ABNORMAL LOW (ref 13.0–17.0)
MCH: 36.7 pg — ABNORMAL HIGH (ref 26.0–34.0)
MCHC: 34.7 g/dL (ref 30.0–36.0)
MCV: 105.8 fL — ABNORMAL HIGH (ref 78.0–100.0)
Platelets: 181 10*3/uL (ref 150–400)
RBC: 3.43 MIL/uL — ABNORMAL LOW (ref 4.22–5.81)
RDW: 16.2 % — AB (ref 11.5–15.5)
WBC: 14.9 10*3/uL — ABNORMAL HIGH (ref 4.0–10.5)

## 2013-11-04 LAB — BASIC METABOLIC PANEL
BUN: 24 mg/dL — ABNORMAL HIGH (ref 6–23)
CO2: 23 meq/L (ref 19–32)
CREATININE: 1.65 mg/dL — AB (ref 0.50–1.35)
Calcium: 6.9 mg/dL — ABNORMAL LOW (ref 8.4–10.5)
Chloride: 105 mEq/L (ref 96–112)
GFR calc Af Amer: 52 mL/min — ABNORMAL LOW (ref >90)
GFR, EST NON AFRICAN AMERICAN: 45 mL/min — AB (ref >90)
GLUCOSE: 96 mg/dL (ref 70–99)
Potassium: 3.7 mEq/L (ref 3.7–5.3)
Sodium: 143 mEq/L (ref 137–147)

## 2013-11-04 MED ORDER — ADULT MULTIVITAMIN W/MINERALS CH: 1.0000 | ORAL_TABLET | Freq: Every day | ORAL | Status: DC

## 2013-11-04 MED ORDER — FOLIC ACID 1 MG PO TABS: 1.0000 mg | ORAL_TABLET | Freq: Every day | ORAL | Status: DC

## 2013-11-04 MED ORDER — NYSTATIN 100000 UNIT/GM EX POWD: CUTANEOUS | Status: DC

## 2013-11-04 MED ORDER — PREDNISONE 5 MG PO TABS
ORAL_TABLET | ORAL | Status: DC
Start: 2013-11-04 — End: 2013-12-26

## 2013-11-04 MED ORDER — THIAMINE HCL 100 MG PO TABS: 100.0000 mg | ORAL_TABLET | Freq: Every day | ORAL | Status: DC

## 2013-11-04 MED ORDER — SULFAMETHOXAZOLE-TMP DS 800-160 MG PO TABS: 2.0000 | ORAL_TABLET | Freq: Two times a day (BID) | ORAL | Status: AC

## 2013-11-04 MED ORDER — PREDNISONE 10 MG PO TABS
10.0000 mg | ORAL_TABLET | Freq: Every day | ORAL | Status: DC
  Filled 2013-11-04: qty 1

## 2013-11-04 MED ORDER — PREDNISONE 10 MG PO TABS
10.0000 mg | ORAL_TABLET | Freq: Every day | ORAL | Status: DC
  Administered 2013-11-04: 10 mg via ORAL
  Filled 2013-11-04 (×2): qty 1

## 2013-11-04 MED ORDER — "INTERDRY AG TEXTILE 10""X12' EX MISC": CUTANEOUS | Status: DC

## 2013-11-04 NOTE — Care Management Note (Signed)
   CARE MANAGEMENT NOTE 11/04/2013  Patient:  Matthew [name], Matthew [name]   Account Number:  [id number]  Date Initiated:  10/31/2013  Documentation initiated by:  [place]  Subjective/Objective Assessment:   Admitted with sepsis.     Action/Plan:   11/03/13 CM following for progression and d/c planning  11/04/2013 Met with pt re d/c plans, pt will be [facility]. Pt declined [facility] for assessment of wound care. He feels that he can do this himself.   Anticipated DC Date:  11/04/2013   Anticipated DC Plan:  HOME/SELF CARE  In-house referral  Clinical Social Worker      DC Planning Services  CM consult      Choice offered to / List presented to:             Status of service:  Completed, signed off Medicare Important Message given?   (If response is "NO", the following Medicare IM given date fields will be blank) Date Medicare IM given:   Date Additional Medicare IM given:    Discharge Disposition:  HOME/SELF CARE  Per UR Regulation:  Reviewed for med. necessity/level of care/duration of stay  If discussed at [place] of Stay Meetings, dates discussed:    Comments:  Contact:  Rithy, [place] [phone]  Call [facility] @ [place]  ; contact for d/c planning # [phone] 11/04/13 Call placed to [facility] [employer] @ 12 noon on 11/04/2013 re d/c plan, pt declined [facility] as dressing is dry and pt is not homebound this seems appropriate, antibiotics are po and pt will followup at [facility] Internal Medicine clinic. Jasmine Pang RN MPH, case manager, [phone]  10-31-13 4:15pm Luz Lex, RNBSN [phone] [facility] to [facility] reasons. Returned to ETOH abuse prior to coming back in. SW referral placed.

## 2013-11-04 NOTE — Discharge Summary (Signed)
[facility] Discharge Summary  Patient name: Matthew [name] Medical record number: [id number] Date of birth: 1956-09-18 Age: 58 y.o. Gender: male Date of Admission: 10/31/2013  Date of Discharge: 11/04/2013 Admitting Physician: Alveda Reasons, MD  Primary Care Provider: No PCP Per Patient; Patient to follow up with [facility].  Consultants: CCM  Indication for Hospitalization: Septic shock  Discharge Diagnoses/Problem List:  - Septic shock - Cellulitis of panus - Atrial fibrillation - HTN - DM2 - OSA - Obesity  Disposition: Home  Discharge Condition: Improved  Discharge Exam:  General: Obese male sitting in chair, alert and conversant, in NAD  Cardiovascular: Distant heart sounds, regular rate and rhythm, no murmurs/rubs/gallops  Respiratory: clear to auscultation bilaterally, no wheezes, crackles or rhonchi  Abdomen: Obese abdomen with minimal erythema around panus as noted below. Soft, nontender, no rebound or guarding, +bs. Umbilical hernia present that is soft but not easily reducible  Extremities: 1+ pitting edema of LE bilaterally to knees. Stasis skin changes on bilateral lower extremities.  Skin: Large area of slight erythema with peau d'orange skin changes on abdominal panus. Deep folds of intertriginous groin area appear moist and red. No abscesses or pustules are noted. Scarred ulcer on right lower side of panus.  Neuro: The patient is alert and oriented. No focal deficits.   Brief Hospital Course: Matthew [name] is a 58 y.o. with PMH significant for HTN, GERD, DM, EtOH abuse. Patient was previously in alcohol rehab program at [facility] where he was dismissed 1/29 because of worsening cellulitis. Since leaving he relapsed and abused alcohol at home. Night of 2/1 he decided he was feeling bad enough that he called EMS and was brought to ED. Says he only felt "off" and "bad" at that time, no specific complaints of  increased pain, CP, SOB, fevers, chills, nausea/vomiting. While in the ED he was septic and admitted to ICU where he received fluid resuscitation, stress dose steroids, and norepinephrine and dobutamine for pressure support.   Vanc and zosyn were started for empiric cellulitis treatment. Pressors were discontinued on 2/3 and the patient continued to maintain adequate blood pressures. On 2/4 the patient was felt to be stable to transfer to the floor.  Vanc and Zosyn were discontinued (11/03/13) and the patient was transitioned to PO bactrim 2 DS tabs BID. This will continue outpatient for a 10 day total course of antibiotics (10/31/13-11/10/13). Blood cultures drawn on admission remained no growth at discharge.   The patient has a history of DM2 but has not been taking his metformin. A1c returned at 5.6% and metformin will continue to be held after discharge and can be restarted at the discretion of the patient's PCP. Pt is prescribed benazepril and diltiazem, benazepril held for low blood pressures. Additionally, the patient is prescribed elequis but per ICU team's conversation with pt's former cardiologist this should be discontinued given pt's risk of falls with ETOH abuse. CXR on admission showed possible pulmonary edema and ECHO showed normal systolic function. Physical therapy evaluated the patient who felt he did not require outpatient physical therapy or DME. The patient was seen and evaluated on the day of discharge and felt to be stable for discharge home with self care.  Issues for Follow Up:  - Close follow up of DM-2, repeat A1C in 3 months. - Recheck BMP - F/U BP; consider restarting ACEI  Significant Procedures: None  Significant Labs and Imaging:   Recent Labs Lab 11/02/13 0920 11/03/13  1110 11/04/13 0345  WBC 17.0* 11.7* 14.9*  HGB 13.4 13.3 12.6*  HCT 38.4* 38.4* 36.3*  PLT 162 156 181    Recent Labs Lab 10/31/13 0320 11/01/13 0537 11/02/13 0920 11/04/13 0345  NA 143 143  141 143  K 4.6 3.6* 3.5* 3.7  CL 99 104 101 105  CO2 20 25 25 23   GLUCOSE 102* 135* 162* 96  BUN 14 13 20  24*  CREATININE 1.97* 1.20 1.39* 1.65*  CALCIUM 7.9* 7.3* 7.1* 6.9*  ALKPHOS 160*  --  148*  --   AST 135*  --  117*  --   ALT 67*  --  68*  --   ALBUMIN 2.3*  --  2.3*  --     CXR 11/02/13: IMPRESSION:  1. Slightly improved interstitial edema.  2. Small left pleural effusion.  3. Persistent bibasilar atelectasis.  4. Background changes suggest underlying COPD  ECHO 10/31/13: Study Conclusions - Left ventricle: The cavity size was normal. There was mild concentric hypertrophy. Systolic function was vigorous. The estimated ejection fraction was in the range of 65% to 70%. The study is not technically sufficient to allow evaluation of LV diastolic function. - Mitral valve: Calcified annulus. - Left atrium: The atrium was mildly dilated.   Results/Tests Pending at Time of Discharge: None  Discharge Medications:    Medication List    STOP taking these medications       ALPRAZolam 0.5 MG tablet  Commonly known as:  XANAX     benazepril 40 MG tablet  Commonly known as:  LOTENSIN     ELIQUIS 5 MG Tabs tablet  Generic drug:  apixaban     furosemide 40 MG tablet  Commonly known as:  LASIX     metFORMIN 500 MG tablet  Commonly known as:  GLUCOPHAGE      TAKE these medications       albuterol 108 (90 BASE) MCG/ACT inhaler  Commonly known as:  PROVENTIL HFA;VENTOLIN HFA  Inhale 2 puffs into the lungs every 4 (four) hours as needed for wheezing or shortness of breath.     diltiazem 240 MG 24 hr capsule  Commonly known as:  CARDIZEM CD  Take 1 capsule (240 mg total) by mouth daily.     folic acid 1 MG tablet  Commonly known as:  FOLVITE  Take 1 tablet (1 mg total) by mouth daily.     multivitamin with minerals Tabs tablet  Take 1 tablet by mouth daily.     nystatin 100000 UNIT/GM Powd  Apply topically 2 times daily.     predniSONE 5 MG tablet  Commonly  known as:  DELTASONE  Take 2 tablets (10 mg) daily for 2 days, then 1 tablet (5 mg) daily for 2 days then discontinue.     sulfamethoxazole-trimethoprim 800-160 MG per tablet  Commonly known as:  BACTRIM DS  Take 2 tablets by mouth every 12 (twelve) hours.     thiamine 100 MG tablet  Take 1 tablet (100 mg total) by mouth daily.        Discharge Instructions: Please refer to Patient Instructions section of EMR for full details.  Patient was counseled important signs and symptoms that should prompt return to medical care, changes in medications, dietary instructions, activity restrictions, and follow up appointments.   Follow-Up Appointments: Follow-up Information   Follow up with Gwendolyn Fill, MD On 11/09/2013. (10:30)    Specialty:  Family Medicine   Contact information:   [address]  [place] [address] [phone]      Kathleen Tompkins, Med Student MS4, [facility]  I have seen and examine the patient and agree with the above discharge summary in its entirety.  Addendum in blue. My physical exam is below:  Exam: General: well appearing obese male in NAD.  Cardiovascular: RRR. No murmurs, rubs, or gallops. Respiratory: CTAB. No rales, rhonchi, or wheeze. Abdomen:  Morbidly obese, soft, nontender.  Extremities: 1+ LE edema with evidence of chronic venous stasis changes bilaterally.  Skin: erythema noted on abdomen (extending to mid-abdomen).  Large pannus with peau d'orange changes noted.  Erythema noted under pannus, consistent with yeast intertrigo.   Neuro: No focal deficits.   [place] PGY-2

## 2013-11-04 NOTE — Discharge Instructions (Signed)
Please be sure to attend your follow up appointment.  Please take your medications as prescribed.  The lasix, benazepril, apixaban, and xanax were discontinued.   Continue to remain abstinent from alcohol use.

## 2013-11-04 NOTE — Discharge Summary (Signed)
Family Medicine Teaching Service  Discharge Note : Attending Annabell Sabal MD Pager [phone] Inpatient Team Pager:  [phone]  I have reviewed this patient and the patient's chart and have discussed discharge planning with the resident at the time of discharge. I agree with the discharge plan as above.  On DC, patient adamant he spent 2 weeks in ICU.  Spoke with him about ICU delirium.  Patient called son who corroborated admit date.  Patient apologized for being rude to nursing staff.  Ready for DC

## 2013-11-04 NOTE — Progress Notes (Signed)
Patient was discharged home with son.  Patient was given discharge instructions and information on prescriptions.  Patient was told to refrain from alcohol.  Patient was given education on skin care.  Patient was given a work excuse by doctor.  Patient was discharged with belongings.  Patient was stable upon discharge.

## 2013-11-04 NOTE — Progress Notes (Signed)
Physical Therapy Treatment Patient Details Name: Matthew [name] MRN: [id number] DOB: July 29, 1956 Today's Date: 11/04/2013 Time: 0865-7846 PT Time Calculation (min): 38 min  PT Assessment / Plan / Recommendation  History of Present Illness Matthew [name] is a 58 y.o. male admitted on 10/31/13 with septic shock 2/2 cellulitis . PMH is significant for ETOH abuse, HTN, atrial fibrillation, DM and morbid obesity.    PT Comments   Patient progressing with activity tolerance, HR max 136 with activity (radial.)    Follow Up Recommendations  Home health PT     Does the patient have the potential to tolerate intense rehabilitation   N/A  Barriers to Discharge  None      Equipment Recommendations  None recommended by PT    Recommendations for Other Services  None  Frequency Min 3X/week   Progress towards PT Goals Progress towards PT goals: Progressing toward goals  Plan Current plan remains appropriate    Precautions / Restrictions Precautions Precautions: Fall Precaution Comments: pt reports multiple falls but only when he drinks   Pertinent Vitals/Pain Denies pain    Mobility  Bed Mobility General bed mobility comments: NT pt in recliner Transfers Overall transfer level: Modified independent Transfers: Sit to/from Stand General transfer comment: increased difficulty from low wheelchair, but with momentum strategy and UE assist can still stand unassisted Ambulation/Gait Ambulation/Gait assistance: Supervision Ambulation Distance (Feet): 125 Feet (x 2 and 150) Assistive device: None Gait Pattern/deviations: Wide base of support;Decreased stride length;Shuffle General Gait Details: increased lateral sway with momentum for foot progression due to heavy pannus Stairs: Yes Stairs assistance: Supervision Stair Management: One rail Right;Sideways Number of Stairs: 4 General stair comments: assist for safety; pt shaky states due to nervous.    Exercises Other Exercises Other  Exercises: Education on progression of activity to include HHPT (walking), possibly outpatient PT then to Y for fitness (water walking if okay with MD;) educated on energy conservation Other Exercises: Educated on fall reduction strategies for home     PT Goals (current goals can now be found in the care plan section)    Visit Information  Last PT Received On: 11/04/13 Assistance Needed: +1 History of Present Illness: Matthew [name] is a 58 y.o. male admitted on 10/31/13 with septic shock 2/2 cellulitis . PMH is significant for ETOH abuse, HTN, atrial fibrillation, DM and morbid obesity.     Subjective Data   Doctor says I have CHF, but is reversible if I lose some weight.   Cognition  Cognition Arousal/Alertness: Awake/alert Behavior During Therapy: WFL for tasks assessed/performed Overall Cognitive Status: Within Functional Limits for tasks assessed    Balance  Balance Overall balance assessment: History of Falls Sitting-balance support: Feet supported Sitting balance-Leahy Scale: Good Standing balance support: No upper extremity supported Standing balance-Leahy Scale: Good  End of Session PT - End of Session Equipment Utilized During Treatment: Gait belt Activity Tolerance: Patient tolerated treatment well Patient left: in chair;with call bell/phone within reach;with chair alarm set   GP     [facility] 11/04/2013, 11:24 AM Magda Kiel, [place] 11/04/2013

## 2013-11-04 NOTE — Evaluation (Signed)
Occupational Therapy Evaluation Patient Details Name: Matthew [name] MRN: [id number] DOB: 07-01-1956 Today's Date: 11/04/2013 Time: 3524-8185 OT Time Calculation (min): 25 min  OT Assessment / Plan / Recommendation History of present illness Matthew [name] is a 58 y.o. male admitted on 10/31/13 with septic shock 2/2 cellulitis . PMH is significant for ETOH abuse, HTN, atrial fibrillation, DM and morbid obesity.    Clinical Impression   Pt is Mod I with ADLs and ADL mobility and no further acute OT services indicated at this time. Pt has a sock aid at home and OT educated pt on additional ADL A/E for home use and energy conservation techniques. Pt to continue with acute PT services for functional mobility safety, OT will sign off    OT Assessment  Patient does not need any further OT services    Follow Up Recommendations  No OT follow up    Barriers to Discharge  none    Equipment Recommendations  Other (comment) (ADL A/E kit)    Recommendations for Other Services    Frequency       Precautions / Restrictions Precautions Precautions: Fall Precaution Comments: pt reports multiple falls but only when he drinks Restrictions Weight Bearing Restrictions: No   Pertinent Vitals/Pain No c/o pain    ADL  Grooming: Performed;Wash/dry hands;Wash/dry face;Modified independent Upper Body Bathing: Simulated;Modified independent Where Assessed - Upper Body Bathing: Unsupported sitting Lower Body Bathing: Simulated;Modified independent Upper Body Dressing: Performed;Modified independent Lower Body Dressing: Simulated;Modified independent;Other (comment) (difficukty with socks and shoes at home, has sock aid ) Toilet Transfer: Performed;Modified independent Toilet Transfer Method: Sit to Loss adjuster, chartered: Regular height toilet;Grab bars Toileting - Clothing Manipulation and Hygiene: Performed;Modified independent Where Assessed - Toileting Clothing Manipulation and  Hygiene: Standing Tub/Shower Transfer: Simulated;Performed;Modified independent Tub/Shower Transfer Method: Therapist, art: Grab bars;Walk in shower ADL Comments: Pt states that he has a sock aid at home from when his mother used it from her hip surgeries. Pt provided with education on additional ADL A/E for use at home    OT Diagnosis:    OT Problem List:   OT Treatment Interventions:     OT Goals(Current goals can be found in the care plan section) Acute Rehab OT Goals Patient Stated Goal: go [facility] today   Visit Information  Last OT Received On: 11/04/13 Assistance Needed: +1 History of Present Illness: Matthew [name] is a 58 y.o. male admitted on 10/31/13 with septic shock 2/2 cellulitis . PMH is significant for ETOH abuse, HTN, atrial fibrillation, DM and morbid obesity.        Prior [place] expects to be discharged to:: Private residence Living Arrangements: Children Available Help at Discharge: Family;Available PRN/intermittently Type of Home: House Home Access: Stairs to enter [employer] of Steps: 4 Entrance Stairs-Rails: Left;Right;Can reach both Home Layout: One level Home Equipment: Clinical cytogeneticist - standard Prior Function Level of Independence: Independent Comments: works as a Systems analyst: No difficulties Dominant Hand: Right         Vision/Perception Vision - History Baseline Vision: Wears glasses all the time Patient Visual Report: No change from baseline Perception Perception: Within Functional Limits   Cognition  Cognition Arousal/Alertness: Awake/alert Behavior During Therapy: WFL for tasks assessed/performed Overall Cognitive Status: Within Functional Limits for tasks assessed    Extremity/Trunk Assessment Upper Extremity Assessment Upper Extremity Assessment: Overall WFL for tasks assessed Lower Extremity Assessment Lower Extremity  Assessment: Defer to PT  evaluation Cervical / Trunk Assessment Cervical / Trunk Assessment: Kyphotic     Mobility Bed Mobility Overal bed mobility: Modified Independent General bed mobility comments: NT pt in recliner Transfers Overall transfer level: Modified independent Equipment used: None Transfers: Sit to/from Stand Sit to Stand: Modified independent (Device/Increase time) General transfer comment: with momentum strategy and UE assist can still stand unassisted        Balance Balance Overall balance assessment: History of Falls Sitting-balance support: No upper extremity supported;Feet supported Sitting balance-Leahy Scale: Good Standing balance support: No upper extremity supported;During functional activity Standing balance-Leahy Scale: Good   End of Session OT - End of Session Activity Tolerance: Patient tolerated treatment well Patient left: in chair;with call bell/phone within reach;with chair alarm set  GO     Britt Bottom 11/04/2013, 1:22 PM

## 2013-11-06 LAB — CULTURE, BLOOD (ROUTINE X 2)
CULTURE: NO GROWTH
Culture: NO GROWTH

## 2013-11-09 ENCOUNTER — Telehealth: Payer: Self-pay | Admitting: Family Medicine

## 2013-11-09 ENCOUNTER — Encounter: Payer: Self-pay | Admitting: Family Medicine

## 2013-11-09 ENCOUNTER — Ambulatory Visit (INDEPENDENT_AMBULATORY_CARE_PROVIDER_SITE_OTHER): Payer: Managed Care, Other (non HMO) | Admitting: Family Medicine

## 2013-11-09 VITALS — BP 147/73 | HR 98 | Temp 99.0°F | Ht 70.0 in | Wt 384.0 lb

## 2013-11-09 DIAGNOSIS — E119 Type 2 diabetes mellitus without complications: Secondary | ICD-10-CM

## 2013-11-09 DIAGNOSIS — L02219 Cutaneous abscess of trunk, unspecified: Secondary | ICD-10-CM

## 2013-11-09 DIAGNOSIS — Z659 Problem related to unspecified psychosocial circumstances: Secondary | ICD-10-CM

## 2013-11-09 DIAGNOSIS — Z658 Other specified problems related to psychosocial circumstances: Secondary | ICD-10-CM

## 2013-11-09 DIAGNOSIS — L03319 Cellulitis of trunk, unspecified: Secondary | ICD-10-CM

## 2013-11-09 DIAGNOSIS — I4891 Unspecified atrial fibrillation: Secondary | ICD-10-CM

## 2013-11-09 DIAGNOSIS — I10 Essential (primary) hypertension: Secondary | ICD-10-CM

## 2013-11-09 DIAGNOSIS — F1010 Alcohol abuse, uncomplicated: Secondary | ICD-10-CM

## 2013-11-09 MED ORDER — BENAZEPRIL HCL 20 MG PO TABS: 20.0000 mg | ORAL_TABLET | Freq: Every day | ORAL | Status: DC

## 2013-11-09 MED ORDER — NYSTATIN 100000 UNIT/GM EX POWD: CUTANEOUS | Status: DC

## 2013-11-09 NOTE — Telephone Encounter (Signed)
[facility] is call because the power that was prescribed, is out of stock until 2/12. They wanted to know could they substitute the cream or would it be okay for him to wait until tomorrow to pick up the powder. jw

## 2013-11-09 NOTE — Patient Instructions (Addendum)
It has been a pleasure to see you today. Please take the medications as prescribed. Make your next appointment in 2-3 months for evaluation of chronic conditions, or sooner if needed.

## 2013-11-10 ENCOUNTER — Telehealth: Payer: Self-pay | Admitting: Family Medicine

## 2013-11-10 NOTE — Telephone Encounter (Signed)
Pt called because Dr. Thomes Dinning was going to call a Education officer, museum for him. He was wanting to know the status of that. jw

## 2013-11-10 NOTE — Telephone Encounter (Signed)
Ok to wait until he can pick up powder. Thanks

## 2013-11-10 NOTE — Telephone Encounter (Signed)
Spoke with pharmacy and gave below message

## 2013-11-11 DIAGNOSIS — Z659 Problem related to unspecified psychosocial circumstances: Secondary | ICD-10-CM | POA: Insufficient documentation

## 2013-11-11 NOTE — Progress Notes (Addendum)
Family Medicine Office Visit Note   Subjective:   Patient ID: Matthew [name], male  DOB: 1956/03/28, 57 y.o.. MRN: [id number]   Pt that comes today for recent hospital discharge due to sepsis secondary to pannus cellulitis.   Pt issue to f/u today are:  1. Panus cellulitis: pt finished course of abx, and denies pain redness or acute changes.   2. DM-2 with repeat A1C in 3 months: pt was on metformin but A1C was 5.6.   3. F/u BP consider restarting ACE: pt was on benazepril in the past and denies noticeable side effects. He desires continue on this medication. He also has hx of Afib and was on diltiazem with Eliquis being discontinue by pt;s prior cardiologist die to risk  falls with ETOH abuse.  4. Also pt has financial difficulties and is requesting social worker in order to help with programs that he may qualify since he los his job and is to the point to lose his house and his car.   5. ETOH abuse: stated he has no drank since d/c'd form hospital   Review of Systems:  Pt denies SOB, chest pain, palpitations, headaches, dizziness, numbness or weakness. No changes on urinary or BM habits. No unintentional weigh loss/gain.  Objective:   Physical Exam: General:  Obese, NAD.  Cardiovascular: RRR. No murmurs, rubs, or gallops.  Respiratory: CTAB. No rales, rhonchi, or wheeze.  Abdomen: Morbidly obese, soft, nontender.  Extremities: 1+ LE edema with evidence of chronic venous stasis changes bilaterally.  Skin:. Large pannus with peau d'orange changes noted. yeast intertrigo improving.  Neuro: No focal deficits.  Assessment & Plan:

## 2013-11-11 NOTE — Assessment & Plan Note (Signed)
Resolved

## 2013-11-11 NOTE — Assessment & Plan Note (Addendum)
Benazepril was restarted today at 1/2 dose since BP are only borderline high.

## 2013-11-11 NOTE — Assessment & Plan Note (Signed)
Reports not drinking since out of hospital.

## 2013-11-11 NOTE — Assessment & Plan Note (Signed)
On Diltiazem, rate controlled No anticoag candidate due to alcohol abuse and risk of falls.

## 2013-11-11 NOTE — Assessment & Plan Note (Signed)
A1C at [employer]. No on med. Will recheck in 3 months and discuss if metformin need to be re-started.  Diet education given

## 2013-11-11 NOTE — Assessment & Plan Note (Signed)
Pt reports that lost his job a long time ago and has financial limitations to the point of being close to lose house and car.  We offered  paperwork in order to apply for MAP  Will inform to our Social Worker about situation to see if pt qualify for assistance programs.

## 2013-11-22 ENCOUNTER — Other Ambulatory Visit: Payer: Self-pay | Admitting: Family Medicine

## 2013-12-02 ENCOUNTER — Other Ambulatory Visit: Payer: Self-pay | Admitting: Family Medicine

## 2013-12-05 ENCOUNTER — Other Ambulatory Visit: Payer: Self-pay | Admitting: *Deleted

## 2013-12-05 MED ORDER — FOLIC ACID 1 MG PO TABS: 1.0000 mg | ORAL_TABLET | Freq: Every day | ORAL | Status: DC

## 2013-12-15 ENCOUNTER — Telehealth: Payer: Self-pay | Admitting: Clinical

## 2013-12-15 NOTE — Telephone Encounter (Signed)
Clinical Education officer, museum (CSW) received a referral to assist pt with financial resources. CSW contacted pt and explored needs/concerns. Pt stated he has been having a challenging time financially, and would be interested in employment resources.Pt stated he is worried that he will be discriminated because of his age when applying for jobs.  CSW provided active listening and validated pts feelings. CSW explored pt employment history and whether he has started applying for jobs. 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 CSW provided positive feedback and encouragement regarding pt experience. Pt did state he has not applied to jobs as he has been recovering from his recent hospitalization (sepsis). CSW provided several resources over the phone and informed pt that CSW would mail pt a packet of resources (emergency financial assistance, housing, food pantries, job links.) Pt was very Patent attorney and states that he has enough money to carry him into April however will consider resources. No additional needs.  Packet mailed to pt.  Hunt Oris, MSW, [place]

## 2013-12-26 ENCOUNTER — Emergency Department (HOSPITAL_COMMUNITY): Payer: Managed Care, Other (non HMO)

## 2013-12-26 ENCOUNTER — Encounter (HOSPITAL_COMMUNITY): Payer: Self-pay | Admitting: Emergency Medicine

## 2013-12-26 ENCOUNTER — Emergency Department (HOSPITAL_COMMUNITY)
Admission: EM | Admit: 2013-12-26 | Discharge: 2013-12-26 | Disposition: A | Discharge status: Home / Self Care | Payer: Managed Care, Other (non HMO) | Attending: Emergency Medicine | Admitting: Emergency Medicine

## 2013-12-26 DIAGNOSIS — Z87891 Personal history of nicotine dependence: Secondary | ICD-10-CM | POA: Insufficient documentation

## 2013-12-26 DIAGNOSIS — Z79899 Other long term (current) drug therapy: Secondary | ICD-10-CM | POA: Insufficient documentation

## 2013-12-26 DIAGNOSIS — R600 Localized edema: Secondary | ICD-10-CM

## 2013-12-26 DIAGNOSIS — I10 Essential (primary) hypertension: Secondary | ICD-10-CM | POA: Insufficient documentation

## 2013-12-26 DIAGNOSIS — R609 Edema, unspecified: Secondary | ICD-10-CM | POA: Insufficient documentation

## 2013-12-26 DIAGNOSIS — I509 Heart failure, unspecified: Secondary | ICD-10-CM | POA: Insufficient documentation

## 2013-12-26 DIAGNOSIS — F411 Generalized anxiety disorder: Secondary | ICD-10-CM | POA: Insufficient documentation

## 2013-12-26 DIAGNOSIS — R0602 Shortness of breath: Secondary | ICD-10-CM

## 2013-12-26 DIAGNOSIS — Z8619 Personal history of other infectious and parasitic diseases: Secondary | ICD-10-CM | POA: Insufficient documentation

## 2013-12-26 DIAGNOSIS — Z8719 Personal history of other diseases of the digestive system: Secondary | ICD-10-CM | POA: Insufficient documentation

## 2013-12-26 DIAGNOSIS — F419 Anxiety disorder, unspecified: Secondary | ICD-10-CM

## 2013-12-26 DIAGNOSIS — I4891 Unspecified atrial fibrillation: Secondary | ICD-10-CM | POA: Insufficient documentation

## 2013-12-26 HISTORY — DX: Severe sepsis with septic shock: R65.21

## 2013-12-26 HISTORY — DX: Sepsis, unspecified organism: A41.9

## 2013-12-26 LAB — CBC WITH DIFFERENTIAL/PLATELET
Basophils Absolute: 0.1 10*3/uL (ref 0.0–0.1)
Basophils Relative: 1 % (ref 0–1)
EOS ABS: 0 10*3/uL (ref 0.0–0.7)
Eosinophils Relative: 0 % (ref 0–5)
HCT: 40.1 % (ref 39.0–52.0)
Hemoglobin: 14.2 g/dL (ref 13.0–17.0)
LYMPHS ABS: 1.2 10*3/uL (ref 0.7–4.0)
Lymphocytes Relative: 9 % — ABNORMAL LOW (ref 12–46)
MCH: 34.9 pg — AB (ref 26.0–34.0)
MCHC: 35.4 g/dL (ref 30.0–36.0)
MCV: 98.5 fL (ref 78.0–100.0)
Monocytes Absolute: 1.1 10*3/uL — ABNORMAL HIGH (ref 0.1–1.0)
Monocytes Relative: 8 % (ref 3–12)
Neutro Abs: 10.7 10*3/uL — ABNORMAL HIGH (ref 1.7–7.7)
Neutrophils Relative %: 82 % — ABNORMAL HIGH (ref 43–77)
PLATELETS: 268 10*3/uL (ref 150–400)
RBC: 4.07 MIL/uL — AB (ref 4.22–5.81)
RDW: 15.1 % (ref 11.5–15.5)
WBC: 13.1 10*3/uL — ABNORMAL HIGH (ref 4.0–10.5)

## 2013-12-26 LAB — BASIC METABOLIC PANEL
BUN: 15 mg/dL (ref 6–23)
CALCIUM: 8.8 mg/dL (ref 8.4–10.5)
CO2: 21 mEq/L (ref 19–32)
Chloride: 101 mEq/L (ref 96–112)
Creatinine, Ser: 0.98 mg/dL (ref 0.50–1.35)
GFR calc Af Amer: >90 mL/min (ref >90)
GFR calc non Af Amer: >90 mL/min (ref >90)
GLUCOSE: 150 mg/dL — AB (ref 70–99)
Potassium: 4.9 mEq/L (ref 3.7–5.3)
SODIUM: 139 meq/L (ref 137–147)

## 2013-12-26 LAB — PRO B NATRIURETIC PEPTIDE: Pro B Natriuretic peptide (BNP): 388.1 pg/mL — ABNORMAL HIGH (ref 0–125)

## 2013-12-26 MED ORDER — FUROSEMIDE 20 MG PO TABS: 20.0000 mg | ORAL_TABLET | Freq: Every day | ORAL | Status: DC

## 2013-12-26 MED ORDER — HYDROXYZINE HCL 25 MG PO TABS
25.0000 mg | ORAL_TABLET | Freq: Once | ORAL | Status: AC
  Administered 2013-12-26: 25 mg via ORAL
  Filled 2013-12-26: qty 1

## 2013-12-26 NOTE — ED Provider Notes (Signed)
CSN: [id number]     Arrival date & time 12/26/13  1226 History   First MD Initiated Contact with Patient 12/26/13 1305     Chief Complaint  Patient presents with  . Anxiety     (Consider location/radiation/quality/duration/timing/severity/associated sxs/prior Treatment) Patient is a 58 y.o. male presenting with shortness of breath. The history is provided by the patient. No language interpreter was used.  Shortness of Breath Severity:  Moderate Onset quality:  Sudden Duration:  12 hours Timing:  Constant Progression:  Waxing and waning Chronicity:  Recurrent Context: emotional upset   Context comment:  Son ran away from home Relieved by:  Nothing Worsened by:  Movement Ineffective treatments:  Rest Associated symptoms: no abdominal pain, no chest pain, no claudication, no cough, no diaphoresis, no fever, no headaches, no hemoptysis, no rash, no sputum production, no vomiting and no wheezing   Associated symptoms comment:  BLLE edema, anxiety, tremulousness  Risk factors: obesity     Past Medical History  Diagnosis Date  . Atrial fibrillation, chronic   . Hypertension   . GERD (gastroesophageal reflux disease)   . Septic shock    Past Surgical History  Procedure Laterality Date  . Splenectomy    . Tonsilectomy, adenoidectomy, bilateral myringotomy and tubes    . Knee arthroscopy    . Hernia repair     No family history on file. History  Substance Use Topics  . Smoking status: Former Smoker    Types: Cigarettes    Quit date: 11/01/1974  . Smokeless tobacco: Not on file     Comment: smoked in high school for about a year  . Alcohol Use: No    Review of Systems  Constitutional: Negative for fever, diaphoresis, activity change, appetite change and fatigue.  HENT: Negative for congestion, facial swelling, rhinorrhea and trouble swallowing.   Eyes: Negative for photophobia and pain.  Respiratory: Positive for shortness of breath. Negative for cough, hemoptysis,  sputum production, chest tightness and wheezing.   Cardiovascular: Negative for chest pain, claudication and leg swelling.  Gastrointestinal: Negative for nausea, vomiting, abdominal pain, diarrhea and constipation.  Endocrine: Negative for polydipsia and polyuria.  Genitourinary: Negative for dysuria, urgency, decreased urine volume and difficulty urinating.  Musculoskeletal: Negative for back pain and gait problem.  Skin: Negative for color change, rash and wound.  Allergic/Immunologic: Negative for immunocompromised state.  Neurological: Negative for dizziness, facial asymmetry, speech difficulty, weakness, numbness and headaches.  Psychiatric/Behavioral: Negative for confusion, decreased concentration and agitation.      Allergies  Augmentin; Potassium-containing compounds; and Neomycin  Home Medications   Current Outpatient Rx  Name  Route  Sig  Dispense  Refill  . benazepril (LOTENSIN) 20 MG tablet   Oral   Take 1 tablet (20 mg total) by mouth daily.   90 tablet   3   . diltiazem (TIAZAC) 240 MG 24 hr capsule   Oral   Take 240 mg by mouth daily.         . folic acid (FOLVITE) 1 MG tablet   Oral   Take 1 tablet (1 mg total) by mouth daily.   30 tablet   0   . Misc Natural Product [facility] EYE DROPS #1 OP)   Both Eyes   Place 1 drop into both eyes daily as needed (Eye Allergies).         . Multiple Vitamin (MULTIVITAMIN WITH MINERALS) TABS tablet   Oral   Take 1 tablet by mouth daily.   [place]  tablet   0   . thiamine 100 MG tablet   Oral   Take 1 tablet (100 mg total) by mouth daily.   30 tablet   0   . furosemide (LASIX) 20 MG tablet   Oral   Take 1 tablet (20 mg total) by mouth daily.   30 tablet   0    BP 137/56  Pulse 127  Temp(Src) 98.5 F (36.9 C) (Oral)  Resp 20  SpO2 96% Physical Exam  Constitutional: He is oriented to person, place, and time. He appears well-developed and well-nourished. No distress.  Morbid obesity  HENT:  Head:  Normocephalic and atraumatic.  Mouth/Throat: No oropharyngeal exudate.  Eyes: Pupils are equal, round, and reactive to light.  Neck: Normal range of motion. Neck supple.  Cardiovascular: Normal rate, regular rhythm and normal heart sounds.  Exam reveals no gallop and no friction rub.   No murmur heard. Pulmonary/Chest: Effort normal and breath sounds normal. No respiratory distress. He has no wheezes. He has no rales.  Abdominal: Soft. Bowel sounds are normal. He exhibits no distension and no mass. There is no tenderness. There is no rebound and no guarding.    Musculoskeletal: Normal range of motion. He exhibits edema (2+ BLLE edema). He exhibits no tenderness.  Neurological: He is alert and oriented to person, place, and time.  Skin: Skin is warm and dry.  Psychiatric: He has a normal mood and affect.    ED Course  Procedures (including critical care time) Labs Review Labs Reviewed  CBC WITH DIFFERENTIAL - Abnormal; Notable for the following:    WBC 13.1 (*)    RBC 4.07 (*)    MCH 34.9 (*)    Neutrophils Relative % 82 (*)    Neutro Abs 10.7 (*)    Lymphocytes Relative 9 (*)    Monocytes Absolute 1.1 (*)    All other components within normal limits  BASIC METABOLIC PANEL - Abnormal; Notable for the following:    Glucose, Bld 150 (*)    All other components within normal limits  PRO B NATRIURETIC PEPTIDE - Abnormal; Notable for the following:    Pro B Natriuretic peptide (BNP) 388.1 (*)    All other components within normal limits   Imaging Review Dg Chest 2 View  12/26/2013   CLINICAL DATA:  Shortness of breath  EXAM: CHEST  2 VIEW  COMPARISON:  11/02/2013  FINDINGS: Cardiac shadow is stable. A new right-sided effusion is noted with right basilar infiltrate/ atelectasis. The left lung remains clear.  IMPRESSION: Right basilar effusion with associated infiltrate/atelectasis.   Electronically Signed   By: Inez Catalina M.D.   On: 12/26/2013 13:44     EKG  Interpretation   Date/Time:  Monday December 26 2013 12:36:44 EDT Ventricular Rate:  111 PR Interval:    QRS Duration: 83 QT Interval:  329 QTC Calculation: 447 R Axis:   -53 Text Interpretation:  Atrial fibrillation Left anterior fascicular block  Anterior infarct, old Nonspecific repol abnormality, lateral leads No  significant change since last tracing Confirmed by Elliott  [phone]) on 12/26/2013 1:32:01 PM      MDM   Final diagnoses:  Shortness of breath  Anxiety  Peripheral edema    Pt is a 58 y.o. male with Pmhx as above including CHF and morbid obesity, afib who presents with SOB, anxiety. Pt reports his son ran away from teh house last night, he has been upset about it.  He has  has inc SOB, shaking since last night, but also inc BLLE edema today and had recently been taken off his diuretics after admission for septic shock about 1 month ago. On PE, pt appears a little anxious, HR 90-110's, BP stable, 98% on RA.  Lungs clear.  2+ BLLE edema. Of note, he has large, non-reducible, though non-tender umbilical hernia. Suspect symptoms likely situational, though given pt has inc LE edema and is off diruretics, have ordered basic labs, BNP, CXR.    CXR w/ new R basilar effusion w/ assoc "infiltrate/atelectasis". WBC 13.1, but appears similar to labs 1 month ago. BNP not significantly elevated. Given pt denies fever, chills, cough, CP, doubt XR represents pna.  Pt ambulated w/ pulse ox, maintained O2 sat >96%.  Will restart at 1/2 his prior dose of lasix for LE edema, but pt instructed he needs to check BP daily and hold lasix if low. He has also been asked to speak with his cardiologist tomorrow to confirm this plan. Return precautions given for new or worsening symptoms including worsening SOB, development of CP, cough, fever.  Neta Ehlers, MD 12/26/13 [phone]

## 2013-12-26 NOTE — Progress Notes (Signed)
[facility] provided pt with a list of primary care resources and a [facility] orange card application to help patient establish primary care.  °

## 2013-12-26 NOTE — ED Notes (Addendum)
Per EMS pt from home and Ems were called by police due to respiratory distress. Police were on scene due to son running away last night. Pt c/o of SOB and shaking. Hx of A fib and CHF and ETOH abuse history. Pt 97% O2 room air. HR 95. BP 146/59. Resp 18. CBG 192.

## 2013-12-26 NOTE — Discharge Instructions (Signed)
Generalized Anxiety Disorder Generalized anxiety disorder (GAD) is a mental disorder. It interferes with life functions, including relationships, work, and school. GAD is different from normal anxiety, which everyone experiences at some point in their lives in response to specific life events and activities. Normal anxiety actually helps [place] prepare for and get through these life events and activities. Normal anxiety goes away after the event or activity is over.  GAD causes anxiety that is not necessarily related to specific events or activities. It also causes excess anxiety in proportion to specific events or activities. The anxiety associated with GAD is also difficult to control. GAD can vary from mild to severe. People with severe GAD can have intense waves of anxiety with physical symptoms (panic attacks).  SYMPTOMS The anxiety and worry associated with GAD are difficult to control. This anxiety and worry are related to many life events and activities and also occur more days than not for 6 months or longer. People with GAD also have three or more of the following symptoms (one or more in children):  Restlessness.   Fatigue.  Difficulty concentrating.   Irritability.  Muscle tension.  Difficulty sleeping or unsatisfying sleep. DIAGNOSIS GAD is diagnosed through an assessment by your caregiver. Your caregiver will ask you questions aboutyour mood,physical symptoms, and events in your life. Your caregiver may ask you about your medical history and use of alcohol or drugs, including prescription medications. Your caregiver may also do a physical exam and blood tests. Certain medical conditions and the use of certain substances can cause symptoms similar to those associated with GAD. Your caregiver may refer you to a mental health specialist for further evaluation. TREATMENT The following therapies are usually used to treat GAD:   Medication Antidepressant medication usually is  prescribed for long-term daily control. Antianxiety medications may be added in severe cases, especially when panic attacks occur.   Talk therapy (psychotherapy) Certain types of talk therapy can be helpful in treating GAD by providing support, education, and guidance. A form of talk therapy called cognitive behavioral therapy can teach you healthy ways to think about and react to daily life events and activities.  Stress managementtechniques These include yoga, meditation, and exercise and can be very helpful when they are practiced regularly. A mental health specialist can help determine which treatment is best for you. Some people see improvement with one therapy. However, other people require a combination of therapies. Document Released: 01/10/2013 Document Reviewed: 01/10/2013 [facility] Patient Information 2014 [place], [place].   Peripheral Edema You have swelling in your legs (peripheral edema). This swelling is due to excess accumulation of salt and water in your body. Edema may be a sign of heart, kidney or liver disease, or a side effect of a medication. It may also be due to problems in the leg veins. Elevating your legs and using special support stockings may be very helpful, if the cause of the swelling is due to poor venous circulation. Avoid long periods of standing, whatever the cause. Treatment of edema depends on identifying the cause. Chips, pretzels, pickles and other salty foods should be avoided. Restricting salt in your diet is almost always needed. Water pills (diuretics) are often used to remove the excess salt and water from your body via urine. These medicines prevent the kidney from reabsorbing sodium. This increases urine flow. Diuretic treatment may also result in lowering of potassium levels in your body. Potassium supplements may be needed if you have to use diuretics daily. Daily weights can  help you keep track of your progress in clearing your edema. You should call  your caregiver for follow up care as recommended. SEEK IMMEDIATE MEDICAL CARE IF:   You have increased swelling, pain, redness, or heat in your legs.  You develop shortness of breath, especially when lying down.  You develop chest or abdominal pain, weakness, or fainting.  You have a fever. Document Released: 10/23/2004 Document Revised: 12/08/2011 Document Reviewed: 10/03/2009 [facility] Patient Information 2014 [place].   Shortness of Breath Shortness of breath means you have trouble breathing. Shortness of breath may indicate that you have a medical problem. You should seek immediate medical care for shortness of breath. CAUSES   Not enough oxygen in the air (as with high altitudes or a smoke-filled room).  Short-term (acute) lung disease, including:  Infections, such as pneumonia.  Fluid in the lungs, such as heart failure.  A blood clot in the lungs (pulmonary embolism).  Long-term (chronic) lung diseases.  Heart disease (heart attack, angina, heart failure, and others).  Low red blood cells (anemia).  Poor physical fitness. This can cause shortness of breath when you exercise.  Chest or back injuries or stiffness.  Being overweight.  Smoking.  Anxiety. This can make you feel like you are not getting enough air. DIAGNOSIS  Serious medical problems can usually be found during your physical exam. Tests may also be done to determine why you are having shortness of breath. Tests may include:  Chest X-rays.  Lung function tests.  Blood tests.  Electrocardiography.  Exercise testing.  Echocardiography.  Imaging scans. Your caregiver may not be able to find a cause for your shortness of breath after your exam. In this case, it is important to have a follow-up exam with your caregiver as directed.  TREATMENT  Treatment for shortness of breath depends on the cause of your symptoms and can vary greatly. HOME CARE INSTRUCTIONS   Do not smoke. Smoking is  a common cause of shortness of breath. If you smoke, ask for help to quit.  Avoid being around chemicals or things that may bother your breathing, such as paint fumes and dust.  Rest as needed. Slowly resume your usual activities.  If medicines were prescribed, take them as directed for the full length of time directed. This includes oxygen and any inhaled medicines.  Keep all follow-up appointments as directed by your caregiver. SEEK MEDICAL CARE IF:   Your condition does not improve in the time expected.  You have a hard time doing your normal activities even with rest.  You have any side effects or problems with the medicines prescribed.  You develop any new symptoms. SEEK IMMEDIATE MEDICAL CARE IF:   Your shortness of breath gets worse.  You feel lightheaded, faint, or develop a cough not controlled with medicines.  You start coughing up blood.  You have pain with breathing.  You have chest pain or pain in your arms, shoulders, or abdomen.  You have a fever.  You are unable to walk up stairs or exercise the way you normally do. MAKE SURE YOU:  Understand these instructions.  Will watch your condition.  Will get help right away if you are not doing well or get worse. Document Released: 06/10/2001 Document Revised: 03/16/2012 Document Reviewed: 12/01/2011 [facility] Patient Information 2014 [place].

## 2013-12-26 NOTE — ED Notes (Signed)
Pt c/o shortness of breath; states is related to anxiety

## 2013-12-26 NOTE — ED Notes (Signed)
Pt states his son ran away last night and he is stressed; states son returned but he needs something to help him relax

## 2014-01-19 ENCOUNTER — Encounter (HOSPITAL_COMMUNITY): Payer: Self-pay | Admitting: Emergency Medicine

## 2014-01-19 ENCOUNTER — Emergency Department (HOSPITAL_COMMUNITY): Payer: Self-pay

## 2014-01-19 ENCOUNTER — Inpatient Hospital Stay (HOSPITAL_COMMUNITY): Payer: Self-pay

## 2014-01-19 ENCOUNTER — Inpatient Hospital Stay (HOSPITAL_COMMUNITY)
Admission: EM | Admit: 2014-01-19 | Discharge: 2014-01-23 | DRG: 871 | Disposition: A | Discharge status: Home / Self Care | Payer: Managed Care, Other (non HMO) | Attending: Family Medicine | Admitting: Family Medicine

## 2014-01-19 DIAGNOSIS — E119 Type 2 diabetes mellitus without complications: Secondary | ICD-10-CM

## 2014-01-19 DIAGNOSIS — Z888 Allergy status to other drugs, medicaments and biological substances status: Secondary | ICD-10-CM

## 2014-01-19 DIAGNOSIS — G4730 Sleep apnea, unspecified: Secondary | ICD-10-CM

## 2014-01-19 DIAGNOSIS — D473 Essential (hemorrhagic) thrombocythemia: Secondary | ICD-10-CM | POA: Diagnosis present

## 2014-01-19 DIAGNOSIS — I10 Essential (primary) hypertension: Secondary | ICD-10-CM

## 2014-01-19 DIAGNOSIS — Z87891 Personal history of nicotine dependence: Secondary | ICD-10-CM

## 2014-01-19 DIAGNOSIS — K219 Gastro-esophageal reflux disease without esophagitis: Secondary | ICD-10-CM | POA: Diagnosis present

## 2014-01-19 DIAGNOSIS — J189 Pneumonia, unspecified organism: Secondary | ICD-10-CM | POA: Diagnosis present

## 2014-01-19 DIAGNOSIS — L0100 Impetigo, unspecified: Secondary | ICD-10-CM | POA: Diagnosis present

## 2014-01-19 DIAGNOSIS — I509 Heart failure, unspecified: Secondary | ICD-10-CM | POA: Diagnosis present

## 2014-01-19 DIAGNOSIS — Z79899 Other long term (current) drug therapy: Secondary | ICD-10-CM

## 2014-01-19 DIAGNOSIS — A419 Sepsis, unspecified organism: Secondary | ICD-10-CM | POA: Diagnosis present

## 2014-01-19 DIAGNOSIS — E872 Acidosis, unspecified: Secondary | ICD-10-CM

## 2014-01-19 DIAGNOSIS — E1010 Type 1 diabetes mellitus with ketoacidosis without coma: Secondary | ICD-10-CM | POA: Diagnosis present

## 2014-01-19 DIAGNOSIS — R599 Enlarged lymph nodes, unspecified: Secondary | ICD-10-CM | POA: Diagnosis present

## 2014-01-19 DIAGNOSIS — E669 Obesity, unspecified: Secondary | ICD-10-CM

## 2014-01-19 DIAGNOSIS — Z881 Allergy status to other antibiotic agents status: Secondary | ICD-10-CM

## 2014-01-19 DIAGNOSIS — R6520 Severe sepsis without septic shock: Secondary | ICD-10-CM

## 2014-01-19 DIAGNOSIS — Z6841 Body Mass Index (BMI) 40.0 and over, adult: Secondary | ICD-10-CM

## 2014-01-19 DIAGNOSIS — N179 Acute kidney failure, unspecified: Secondary | ICD-10-CM

## 2014-01-19 DIAGNOSIS — E876 Hypokalemia: Secondary | ICD-10-CM | POA: Diagnosis present

## 2014-01-19 DIAGNOSIS — A498 Other bacterial infections of unspecified site: Secondary | ICD-10-CM | POA: Diagnosis present

## 2014-01-19 DIAGNOSIS — J441 Chronic obstructive pulmonary disease with (acute) exacerbation: Secondary | ICD-10-CM | POA: Diagnosis present

## 2014-01-19 DIAGNOSIS — I4891 Unspecified atrial fibrillation: Secondary | ICD-10-CM

## 2014-01-19 DIAGNOSIS — F1020 Alcohol dependence, uncomplicated: Secondary | ICD-10-CM

## 2014-01-19 DIAGNOSIS — R791 Abnormal coagulation profile: Secondary | ICD-10-CM

## 2014-01-19 LAB — CBC
HCT: 37.8 % — ABNORMAL LOW (ref 39.0–52.0)
Hemoglobin: 13.5 g/dL (ref 13.0–17.0)
MCH: 34 pg (ref 26.0–34.0)
MCHC: 35.7 g/dL (ref 30.0–36.0)
MCV: 95.2 fL (ref 78.0–100.0)
Platelets: 464 10*3/uL — ABNORMAL HIGH (ref 150–400)
RBC: 3.97 MIL/uL — ABNORMAL LOW (ref 4.22–5.81)
RDW: 15.9 % — ABNORMAL HIGH (ref 11.5–15.5)
WBC: 15.1 10*3/uL — ABNORMAL HIGH (ref 4.0–10.5)

## 2014-01-19 LAB — I-STAT VENOUS BLOOD GAS, ED
ACID-BASE DEFICIT: 2 mmol/L (ref 0.0–2.0)
Bicarbonate: 25.2 mEq/L — ABNORMAL HIGH (ref 20.0–24.0)
O2 Saturation: 64 %
PH VEN: 7.282 (ref 7.250–7.300)
TCO2: 27 mmol/L (ref 0–100)
pCO2, Ven: 53.3 mmHg — ABNORMAL HIGH (ref 45.0–50.0)
pO2, Ven: 38 mmHg (ref 30.0–45.0)

## 2014-01-19 LAB — COMPREHENSIVE METABOLIC PANEL
ALT: 28 U/L (ref 0–53)
ALT: 27 U/L (ref 0–53)
AST: 57 U/L — AB (ref 0–37)
AST: 57 U/L — ABNORMAL HIGH (ref 0–37)
Albumin: 2 g/dL — ABNORMAL LOW (ref 3.5–5.2)
Albumin: 1.9 g/dL — ABNORMAL LOW (ref 3.5–5.2)
Alkaline Phosphatase: 168 U/L — ABNORMAL HIGH (ref 39–117)
Alkaline Phosphatase: 156 U/L — ABNORMAL HIGH (ref 39–117)
BUN: 27 mg/dL — ABNORMAL HIGH (ref 6–23)
BUN: 27 mg/dL — ABNORMAL HIGH (ref 6–23)
CALCIUM: 7.6 mg/dL — AB (ref 8.4–10.5)
CO2: 23 mEq/L (ref 19–32)
CO2: 23 meq/L (ref 19–32)
CREATININE: 2.54 mg/dL — AB (ref 0.50–1.35)
CREATININE: 2.36 mg/dL — AB (ref 0.50–1.35)
Calcium: 7.3 mg/dL — ABNORMAL LOW (ref 8.4–10.5)
Chloride: 99 mEq/L (ref 96–112)
Chloride: 103 mEq/L (ref 96–112)
GFR calc Af Amer: 31 mL/min — ABNORMAL LOW (ref >90)
GFR calc non Af Amer: 27 mL/min — ABNORMAL LOW (ref >90)
GFR, EST AFRICAN AMERICAN: 34 mL/min — AB (ref >90)
GFR, EST NON AFRICAN AMERICAN: 29 mL/min — AB (ref >90)
GLUCOSE: 128 mg/dL — AB (ref 70–99)
Glucose, Bld: 150 mg/dL — ABNORMAL HIGH (ref 70–99)
Potassium: 3.2 mEq/L — ABNORMAL LOW (ref 3.7–5.3)
Potassium: 3.3 mEq/L — ABNORMAL LOW (ref 3.7–5.3)
SODIUM: 140 meq/L (ref 137–147)
SODIUM: 142 meq/L (ref 137–147)
Total Bilirubin: 0.7 mg/dL (ref 0.3–1.2)
Total Bilirubin: 0.7 mg/dL (ref 0.3–1.2)
Total Protein: 7 g/dL (ref 6.0–8.3)
Total Protein: 6.6 g/dL (ref 6.0–8.3)

## 2014-01-19 LAB — PROTEIN, BODY FLUID: TOTAL PROTEIN, FLUID: 2.5 g/dL

## 2014-01-19 LAB — MRSA PCR SCREENING: MRSA by PCR: NEGATIVE

## 2014-01-19 LAB — URINALYSIS, ROUTINE W REFLEX MICROSCOPIC
Glucose, UA: NEGATIVE mg/dL
KETONES UR: 15 mg/dL — AB
Nitrite: NEGATIVE
Protein, ur: 30 mg/dL — AB
Specific Gravity, Urine: 1.021 (ref 1.005–1.030)
UROBILINOGEN UA: 1 mg/dL (ref 0.0–1.0)
pH: 5 (ref 5.0–8.0)

## 2014-01-19 LAB — CBC WITH DIFFERENTIAL/PLATELET
BASOS ABS: 0 10*3/uL (ref 0.0–0.1)
Basophils Relative: 0 % (ref 0–1)
EOS ABS: 0 10*3/uL (ref 0.0–0.7)
EOS PCT: 0 % (ref 0–5)
HCT: 38.4 % — ABNORMAL LOW (ref 39.0–52.0)
Hemoglobin: 13.8 g/dL (ref 13.0–17.0)
Lymphocytes Relative: 16 % (ref 12–46)
Lymphs Abs: 2.5 10*3/uL (ref 0.7–4.0)
MCH: 34.2 pg — AB (ref 26.0–34.0)
MCHC: 35.9 g/dL (ref 30.0–36.0)
MCV: 95 fL (ref 78.0–100.0)
Monocytes Absolute: 1.7 10*3/uL — ABNORMAL HIGH (ref 0.1–1.0)
Monocytes Relative: 11 % (ref 3–12)
Neutro Abs: 11.2 10*3/uL — ABNORMAL HIGH (ref 1.7–7.7)
Neutrophils Relative %: 73 % (ref 43–77)
Platelets: 473 10*3/uL — ABNORMAL HIGH (ref 150–400)
RBC: 4.04 MIL/uL — ABNORMAL LOW (ref 4.22–5.81)
RDW: 15.8 % — AB (ref 11.5–15.5)
WBC: 15.3 10*3/uL — ABNORMAL HIGH (ref 4.0–10.5)

## 2014-01-19 LAB — I-STAT CG4 LACTIC ACID, ED
LACTIC ACID, VENOUS: 3.04 mmol/L — AB (ref 0.5–2.2)
Lactic Acid, Venous: 4.57 mmol/L — ABNORMAL HIGH (ref 0.5–2.2)

## 2014-01-19 LAB — URINE MICROSCOPIC-ADD ON

## 2014-01-19 LAB — PRO B NATRIURETIC PEPTIDE: Pro B Natriuretic peptide (BNP): 2009 pg/mL — ABNORMAL HIGH (ref 0–125)

## 2014-01-19 LAB — TROPONIN I
Troponin I: <0.3 ng/mL (ref <0.30)
Troponin I: <0.3 ng/mL (ref <0.30)
Troponin I: <0.3 ng/mL (ref <0.30)

## 2014-01-19 LAB — GLUCOSE, CAPILLARY
GLUCOSE-CAPILLARY: 175 mg/dL — AB (ref 70–99)
GLUCOSE-CAPILLARY: 138 mg/dL — AB (ref 70–99)
Glucose-Capillary: 119 mg/dL — ABNORMAL HIGH (ref 70–99)
Glucose-Capillary: 153 mg/dL — ABNORMAL HIGH (ref 70–99)

## 2014-01-19 LAB — ETHANOL: Alcohol, Ethyl (B): 253 mg/dL — ABNORMAL HIGH (ref 0–11)

## 2014-01-19 LAB — PROTIME-INR
INR: 1.51 — ABNORMAL HIGH (ref 0.00–1.49)
PROTHROMBIN TIME: 17.8 s — AB (ref 11.6–15.2)

## 2014-01-19 LAB — MAGNESIUM: Magnesium: 1.6 mg/dL (ref 1.5–2.5)

## 2014-01-19 LAB — CORTISOL: Cortisol, Plasma: 9.5 ug/dL

## 2014-01-19 LAB — PROTEIN, TOTAL: TOTAL PROTEIN: 6.8 g/dL (ref 6.0–8.3)

## 2014-01-19 LAB — LACTATE DEHYDROGENASE: LDH: 398 U/L — AB (ref 94–250)

## 2014-01-19 LAB — LACTATE DEHYDROGENASE, PLEURAL OR PERITONEAL FLUID: LD FL: 96 U/L — AB (ref 3–23)

## 2014-01-19 LAB — APTT: aPTT: 41 seconds — ABNORMAL HIGH (ref 24–37)

## 2014-01-19 MED ORDER — SODIUM CHLORIDE 0.9 % IV SOLN
Freq: Once | INTRAVENOUS | Status: AC
  Administered 2014-01-19: 05:00:00 via INTRAVENOUS

## 2014-01-19 MED ORDER — SODIUM CHLORIDE 0.9 % IV SOLN
INTRAVENOUS | Status: DC
  Administered 2014-01-19 – 2014-01-20 (×2): via INTRAVENOUS

## 2014-01-19 MED ORDER — HEPARIN SODIUM (PORCINE) 5000 UNIT/ML IJ SOLN
5000.0000 [IU] | Freq: Three times a day (TID) | INTRAMUSCULAR | Status: DC
  Administered 2014-01-19 – 2014-01-23 (×13): 5000 [IU] via SUBCUTANEOUS
  Filled 2014-01-19 (×16): qty 1

## 2014-01-19 MED ORDER — MUPIROCIN CALCIUM 2 % EX CREA
TOPICAL_CREAM | Freq: Every day | CUTANEOUS | Status: DC
  Administered 2014-01-19 – 2014-01-22 (×4): via TOPICAL
  Administered 2014-01-23: 1 via TOPICAL
  Filled 2014-01-19: qty 15

## 2014-01-19 MED ORDER — SODIUM CHLORIDE 0.9 % IJ SOLN
3.0000 mL | Freq: Two times a day (BID) | INTRAMUSCULAR | Status: DC
  Administered 2014-01-19 – 2014-01-23 (×7): 3 mL via INTRAVENOUS

## 2014-01-19 MED ORDER — VANCOMYCIN HCL 10 G IV SOLR
1250.0000 mg | INTRAVENOUS | Status: DC
  Administered 2014-01-19: 1250 mg via INTRAVENOUS
  Filled 2014-01-19 (×3): qty 1250

## 2014-01-19 MED ORDER — LORAZEPAM 2 MG/ML IJ SOLN: 1.0000 mg | Freq: Four times a day (QID) | INTRAMUSCULAR | Status: AC | PRN

## 2014-01-19 MED ORDER — THIAMINE HCL 100 MG/ML IJ SOLN
100.0000 mg | Freq: Every day | INTRAMUSCULAR | Status: DC
  Filled 2014-01-19 (×5): qty 1

## 2014-01-19 MED ORDER — METRONIDAZOLE IN NACL 5-0.79 MG/ML-% IV SOLN
500.0000 mg | Freq: Once | INTRAVENOUS | Status: AC
  Administered 2014-01-19: 500 mg via INTRAVENOUS
  Filled 2014-01-19: qty 100

## 2014-01-19 MED ORDER — FOLIC ACID 1 MG PO TABS
1.0000 mg | ORAL_TABLET | Freq: Every day | ORAL | Status: DC
  Administered 2014-01-19 – 2014-01-23 (×5): 1 mg via ORAL
  Filled 2014-01-19 (×5): qty 1

## 2014-01-19 MED ORDER — ADULT MULTIVITAMIN W/MINERALS CH
1.0000 | ORAL_TABLET | Freq: Every day | ORAL | Status: DC
  Administered 2014-01-19 – 2014-01-23 (×5): 1 via ORAL
  Filled 2014-01-19 (×5): qty 1

## 2014-01-19 MED ORDER — LORAZEPAM 2 MG/ML IJ SOLN: 0.0000 mg | Freq: Two times a day (BID) | INTRAMUSCULAR | Status: AC

## 2014-01-19 MED ORDER — INSULIN ASPART 100 UNIT/ML ~~LOC~~ SOLN: 0.0000 [IU] | Freq: Every day | SUBCUTANEOUS | Status: DC

## 2014-01-19 MED ORDER — LORAZEPAM 2 MG/ML IJ SOLN: 0.0000 mg | Freq: Four times a day (QID) | INTRAMUSCULAR | Status: AC

## 2014-01-19 MED ORDER — SODIUM CHLORIDE 0.9 % IV BOLUS (SEPSIS)
1000.0000 mL | Freq: Once | INTRAVENOUS | Status: AC
  Administered 2014-01-19: 1000 mL via INTRAVENOUS

## 2014-01-19 MED ORDER — ACETAMINOPHEN 325 MG PO TABS
650.0000 mg | ORAL_TABLET | Freq: Four times a day (QID) | ORAL | Status: DC | PRN
  Administered 2014-01-19: 650 mg via ORAL
  Filled 2014-01-19: qty 2

## 2014-01-19 MED ORDER — LORAZEPAM 1 MG PO TABS: 1.0000 mg | ORAL_TABLET | Freq: Four times a day (QID) | ORAL | Status: AC | PRN

## 2014-01-19 MED ORDER — HYDROCORTISONE NA SUCCINATE PF 100 MG IJ SOLR: 50.0000 mg | Freq: Four times a day (QID) | INTRAMUSCULAR | Status: DC

## 2014-01-19 MED ORDER — INSULIN ASPART 100 UNIT/ML ~~LOC~~ SOLN
0.0000 [IU] | Freq: Three times a day (TID) | SUBCUTANEOUS | Status: DC
  Administered 2014-01-19 (×2): 3 [IU] via SUBCUTANEOUS
  Administered 2014-01-21 – 2014-01-23 (×3): 2 [IU] via SUBCUTANEOUS

## 2014-01-19 MED ORDER — VANCOMYCIN HCL IN DEXTROSE 1-5 GM/200ML-% IV SOLN
1000.0000 mg | Freq: Once | INTRAVENOUS | Status: AC
  Administered 2014-01-19: 1000 mg via INTRAVENOUS
  Filled 2014-01-19: qty 200

## 2014-01-19 MED ORDER — VITAMIN B-1 100 MG PO TABS
100.0000 mg | ORAL_TABLET | Freq: Once | ORAL | Status: AC
  Administered 2014-01-19: 100 mg via ORAL
  Filled 2014-01-19: qty 1

## 2014-01-19 MED ORDER — POTASSIUM CHLORIDE 10 MEQ/100ML IV SOLN
10.0000 meq | INTRAVENOUS | Status: AC
  Administered 2014-01-19 (×5): 10 meq via INTRAVENOUS
  Filled 2014-01-19 (×2): qty 100

## 2014-01-19 MED ORDER — CEFEPIME HCL 1 G IJ SOLR
1.0000 g | Freq: Two times a day (BID) | INTRAMUSCULAR | Status: DC
  Administered 2014-01-19 – 2014-01-22 (×8): 1 g via INTRAVENOUS
  Filled 2014-01-19 (×10): qty 1

## 2014-01-19 MED ORDER — VITAMIN B-1 100 MG PO TABS
100.0000 mg | ORAL_TABLET | Freq: Every day | ORAL | Status: DC
  Administered 2014-01-19 – 2014-01-23 (×5): 100 mg via ORAL
  Filled 2014-01-19 (×5): qty 1

## 2014-01-19 NOTE — Consult Note (Addendum)
WOC wound consult note Reason for Consult: Consult requested for leg wounds and groin area.  Pt has several partial thickness abrasions to legs; states he fell at home.  The largest is 1.5X1.5X1.cm.  All areas are red and dry without odor or drainage.  Pt has generalized edema and erythremia to BLE. Plan: Bactroban to promote healing. (Pt has possible sensitivity to Neosporin so this product should not be used.)  Wound type: Pt has large pannus which is tight and firm with peau de orange puckered skin appearance.  Unable to assess skin fold since pt cannot lift this fold to separate and allow skin inspection.  He denies any breakdown or discomfort to this location. Lower abd/ groin skin fold is red, moist and macerated.  Generalized erythremia over scrotum where he c/o discomfort.  No open wounds noted.  Mod amt yellow drainage, some odor.  Appearance is consistent with Intertrigo. Erythremia to scrotum may also be from prolonged pressure to site where pannus is rubbing against scrotum. Dressing procedure/placement/frequency: Interdry silver-impregnated fabric to provide antimicrobial benefits and wick drainage away from skin.  This should remain in place for 5 days for optimal plan of care to promote healing to the affected area.  Orders entered and instructions provided for bedside nurse.  If pain persists at scrotum area and possible sepsis is a concern, consider ordering an ultrasound for further diagnostic purposes. Please re-consult if further assistance is needed.  Thank-you,  Julien Girt MSN, [place], [place], [place], [place]

## 2014-01-19 NOTE — Progress Notes (Signed)
Utilization Review Completed.Matthew Murph T Dowell4/23/2015  

## 2014-01-19 NOTE — H&P (Signed)
[facility] Admission History and Physical Service Pager: [phone]  Patient name: Matthew [name] Medical record number: [id number] Date of birth: [birth year] Age: 58 y.o. Gender: male  Primary Care Provider: Gwendolyn Fill, MD Consultants: none Code Status: full  Chief Complaint: shortness of breath  Assessment and Plan: Matthew [name] is a 58 y.o. male presenting with shortness of breath found to be in severe sepsis. PMH is significant for alcohol abuse, prior septic shock, DM, HTN, sleep apnea.  INFECTIOUS A:  Severe sepsis with evidence of end organ damage with elevated lactic acid, elevated Cr, likely resulting from HCAP (pleural effusion and infiltrate on CXR) vs cellulitis (with erythema on scrotum and noted firm panus). 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 P:   -admit to stepdown, Attending Dr Andria Frames -continue broad spectrum antibiotics vanc and cefepime, given dose of flagyl in the ED -f/u blood and urine cultures -obtain UA to look for source  -trend WBC and lactate -low threshold for contacting CCM for consult  PULMONARY A: Patient with likely HCAP leading to severe sepsis. Hypoxemia and shortness of breath likely cause by potential HCAP. Also note right sided pleural effusion most likely related to infectious process. P:   -see above for antibiotics -O2 via [place] for sats >92% -continuous pulse ox -monitor for decompensation -IS  CARDIOVASCULAR A: Patients initial MAP of 58 responded well to NS boluses. Does not appear to be in shock at this time as MAP up to 70 at time of evaluation. Note patient does have end organ damage.  Last echo with EF 65-70%, elevated pro-BNP (likely related to renal dysfunction) Hx of HTN Afib rate controlled not on anticoagulation Chest pain - location is atypical for cardiac cause, though notes this with exertion  and dyspnea, likely related to underlying PNA P:  -will give an additional 1 L NS bolus -cardiac monitoring -keep close eye on MAP, if below 60 consistently will contact CCM for consult -will check cortisol -hold off on starting solu cortef 50 mg q6 hr until return of cortisol given stable BP at this time -hold home antiHTN -cycle troponins -repeat EKG in am -monitor HR given holding home afib meds, consider digoxin if becomes non-rate controlled  RENAL A:  AKI with previously normal creatinine, now elevated to 2.54. End organ damage from septic state. Hypokalemia Corrected calcium of 9.2 P:   -will give additional bolus NS at this time -then will give NS 150/hr -trend Cr on CMET -will replete potassium 10 mEq x4 runs -check magnesium  GASTROINTESTINAL A:  Mild elevation of AST with normal ALT in ~2:1 ratio possibly related to alcohol use. No evidence of shock liver at this time. Note INR mildly elevated at 1.51. P:   -will keep NPO until stabilizes -will trend LFTs and INRs to monitor for shock liver  HEMATOLOGIC A:  Leukocytosis likely stemming from an infectious process, though note this was not that much higher than his discharge WBC from February. Thrombocytosis - likely an acute phase reactant No therapeutic anticoagulation due to fall risk given alcoholism P:  -antibiotics per above -will trend WBC -heparin SQ  ENDOCRINE A:  Hx of DM - glucose 150 on CMET, last A1c 5.6 11/03/13 P:   -SSI and q4 hr cbgs -check cortisol to rule out relative adrenal insufficiency  -hold off on starting solu cortef 50 mg q6 hr until return of cortisol given  stable BP at this time  NEUROLOGIC A:  Alcohol intoxication - ETOH level 253 P:   -place on CIWA protocol -monitor for withdrawal  Disposition: admit to step down, discharge pending clinical improvement  History of Present Illness: Matthew [name] is a 58 y.o. male presenting with shortness of breath found to have severe  sepsis.  Patient notes he started not feeling well on Monday. He had shortness of breath at that time. Found to have temp to 103 F. Notes EMS evaluated him at home and advised hydration. He notes his shortness of breath typically occurs with exertion. It is accompanied by right sided chest pain that is a pressure. This does not radiate. Occurs intermittently. Occasionally exertion makes the pain worse, though not always. He has endorses some sweating at night with his fevers. Has had a non-productive cough over this time as well. Denies vomiting, though has had dry heaves for a long time. He denies abdominal pain and known skin lesions. He endorses chronic LE edema that is stable. He is an alcoholic and had gone 3-4 weeks without a drink until last night when he consumed a fifth of alcohol.  In the ED he was noted to be tachycardic, tachypneic, and hypoxic. A CXR revealed an unchanged (from one month ago) right sided pleural effusion and infiltrate. Lactic acid elevated to 4.57, INR to 1.51, and Cr to 2.54. Mildly elevated AST to 57 and normal ALT 28. He had a negative troponin. His MAP in the ED initially was noted to be 64 and dropped to 58 prior to receiving any fluid. S/p 2 1 L NS boluses his map was 70 on my evaluation.  Review Of Systems: Per HPI with the following additions: none Otherwise 12 point review of systems was performed and was unremarkable.  Patient Active Problem List   Diagnosis Date Noted  . Poor social situation 11/11/2013  . Chronic alcohol abuse 11/03/2013  . Sepsis 10/31/2013  . Cellulitis and abscess of trunk 10/31/2013  . Septic shock 10/31/2013  . A-fib 03/25/2012  . DM type 2 (diabetes mellitus, type 2) 03/25/2012  . Obesity 03/25/2012  . Sleep apnea 03/25/2012  . Fatty liver 03/25/2012  . HTN (hypertension) 11/02/2011   Past Medical History: Past Medical History  Diagnosis Date  . Atrial fibrillation, chronic   . Hypertension   . GERD (gastroesophageal reflux  disease)   . Septic shock    Past Surgical History: Past Surgical History  Procedure Laterality Date  . Splenectomy    . Tonsilectomy, adenoidectomy, bilateral myringotomy and tubes    . Knee arthroscopy    . Hernia repair     Social History: History  Substance Use Topics  . Smoking status: Former Smoker    Types: Cigarettes    Quit date: 11/01/1974  . Smokeless tobacco: Not on file     Comment: smoked in high school for about a year  . Alcohol Use: Yes   Additional social history: none  Please also refer to relevant sections of EMR.  Family History: No family history on file. Allergies and Medications: Allergies  Allergen Reactions  . Augmentin [Amoxicillin-Pot Clavulanate] Nausea Only  . Potassium-Containing Compounds Other (See Comments)    Stomach Pain  . Neomycin Rash   No current facility-administered medications on file prior to encounter.   Current Outpatient Prescriptions on File Prior to Encounter  Medication Sig Dispense Refill  . benazepril (LOTENSIN) 20 MG tablet Take 1 tablet (20 mg total) by mouth daily.  90 tablet  3  . diltiazem (TIAZAC) 240 MG 24 hr capsule Take 240 mg by mouth daily.      . folic acid (FOLVITE) 1 MG tablet Take 1 tablet (1 mg total) by mouth daily.  30 tablet  0  . furosemide (LASIX) 20 MG tablet Take 1 tablet (20 mg total) by mouth daily.  30 tablet  0  . Multiple Vitamin (MULTIVITAMIN WITH MINERALS) TABS tablet Take 1 tablet by mouth daily.  30 tablet  0  . thiamine 100 MG tablet Take 1 tablet (100 mg total) by mouth daily.  30 tablet  0    Objective: BP 104/62  Pulse 85  Temp(Src) 97.8 F (36.6 C) (Oral)  Resp 27  SpO2 97% Exam: General: NAD, resting comfortably in bed HEENT: NCAT, dry MM, [place] in place, PERRL Cardiovascular: irr irr, no murmurs appreciated Respiratory: right lower lung field with crackles, no wheezes Abdomen: obese, soft, NT, ND, no guarding or rebound, RLQ with ~6 cm diameter skin growth Extremities:  bilateral LE edema noted with venous stasis changes noted Skin: panus firm (states has been that way for several years), no erythema in panus, there is well defined erythema on his testicles, no apparent swelling of the scrotum, no pain with palpation of the scrotum Neuro: alert, no focal deficits noted  Labs and Imaging: CBC BMET   Recent Labs Lab 01/19/14 0201  WBC 15.3*  HGB 13.8  HCT 38.4*  PLT 473*    Recent Labs Lab 01/19/14 0201  NA 140  K 3.2*  CL 99  CO2 23  BUN 27*  CREATININE 2.54*  GLUCOSE 150*  CALCIUM 7.6*     istat lactic acid 4.57 BNP 2009 EKG afib rate controlled Trop neg INR 1.51 Etoh 253  Dg Chest 2 View  01/19/2014   CLINICAL DATA:  Short of breath.  Cough.  EXAM: CHEST - 2 VIEW  COMPARISON:  12/26/2013  FINDINGS: Moderate cardiomegaly. Vascular congestion. Stable right pleural effusion and basilar opacity. No pneumothorax.  IMPRESSION: Stable cardiomegaly, vascular congestion and, and right pleural effusion.   Electronically Signed   By: Maryclare Bean M.D.   On: 01/19/2014 02:11    Leone Haven, MD 01/19/2014, 5:00 AM PGY-2, [facility] pager: [phone], text pages welcome

## 2014-01-19 NOTE — ED Notes (Signed)
Pt reports he is unable to urinate at this time.  

## 2014-01-19 NOTE — ED Notes (Signed)
Pts allergy list contains potassium listed. Potassium not hung and cancelled per order from Dr. Kathrynn Humble.

## 2014-01-19 NOTE — Progress Notes (Signed)
Interim Progress Note  Spoke to patient about plan for CT chest and thoracentesis. Explained procedure of thoracentesis to patient and he understood. Explained risks and benefits of the procedure, including, pneumothorax, bleeding, infection, pain at the site. Explained risks of not having the procedure, including worsening of his infection if there is an empyema. Patient understood and agreed to procedure verbally.  Cordelia Poche, MD PGY-1, [facility] 01/19/2014, 2:43 PM Pager: [phone]

## 2014-01-19 NOTE — ED Notes (Signed)
Pt states he is unable to urinate at this time.

## 2014-01-19 NOTE — ED Provider Notes (Addendum)
CSN: [id number]     Arrival date & time 01/19/14  0017 History   First MD Initiated Contact with Patient 01/19/14 0144     Chief Complaint  Patient presents with  . Shortness of Breath     (Consider location/radiation/quality/duration/timing/severity/associated sxs/prior Treatment) HPI Comments: Pt comes in w/ cc of dib. Hx of afib, chf, htn. Pt reports that he started having dib on Monday. Pt has dib with minimal exertion, although just yday he was able to go to the bank. + cough, dry. Mild right sided chest pain. Pt denies any increased leg swelling, no orthopnea, no PND like sx. Pt comes in to the ER mainly because he fell. Admits to drinking at least a fifth of tequila. Pt unable to get up from fall, due to bad knees and so EMS was called. Pt also has a fever since Monday. Tmax is 103. No n/v/chills/abd pain/rash.  Patient is a 58 y.o. male presenting with shortness of breath. The history is provided by the patient.  Shortness of Breath Associated symptoms: cough and fever   Associated symptoms: no abdominal pain and no chest pain     Past Medical History  Diagnosis Date  . Atrial fibrillation, chronic   . Hypertension   . GERD (gastroesophageal reflux disease)   . Septic shock    Past Surgical History  Procedure Laterality Date  . Splenectomy    . Tonsilectomy, adenoidectomy, bilateral myringotomy and tubes    . Knee arthroscopy    . Hernia repair     No family history on file. History  Substance Use Topics  . Smoking status: Former Smoker    Types: Cigarettes    Quit date: 11/01/1974  . Smokeless tobacco: Not on file     Comment: smoked in high school for about a year  . Alcohol Use: No    Review of Systems  Constitutional: Positive for fever. Negative for activity change and appetite change.  Respiratory: Positive for cough and shortness of breath.   Cardiovascular: Negative for chest pain.  Gastrointestinal: Negative for abdominal pain.  Genitourinary:  Negative for dysuria.  Musculoskeletal: Positive for arthralgias.  All other systems reviewed and are negative.     Allergies  Augmentin; Potassium-containing compounds; and Neomycin  Home Medications   Prior to Admission medications   Medication Sig Start Date End Date Taking? Authorizing Provider  benazepril (LOTENSIN) 20 MG tablet Take 1 tablet (20 mg total) by mouth daily. 11/09/13  Yes Dayarmys [place] de Gwendalyn Ege, MD  diltiazem [facility]) 240 MG 24 hr capsule Take 240 mg by mouth daily.   Yes Historical Provider, MD  folic acid (FOLVITE) 1 MG tablet Take 1 tablet (1 mg total) by mouth daily. 12/05/13  Yes Dayarmys [place] de Gwendalyn Ege, MD  furosemide (LASIX) 20 MG tablet Take 1 tablet (20 mg total) by mouth daily. 12/26/13  Yes Neta Ehlers, MD  Multiple Vitamin (MULTIVITAMIN WITH MINERALS) TABS tablet Take 1 tablet by mouth daily. 11/04/13  Yes Coral Spikes, DO  thiamine 100 MG tablet Take 1 tablet (100 mg total) by mouth daily. 11/04/13  Yes Jayce G Cook, DO   BP 115/45  Pulse 88  Temp(Src) 97.8 F (36.6 C) (Oral)  Resp 18  SpO2 93% Physical Exam  Nursing note and vitals reviewed. Constitutional: He is oriented to person, place, and time. He appears well-developed.  Morbidly obese male  HENT:  Head: Normocephalic and atraumatic.  Eyes: Conjunctivae and EOM are normal. Pupils are equal,  round, and reactive to light.  Neck: Normal range of motion. Neck supple. No JVD present.  Cardiovascular:  Murmur heard. Pulmonary/Chest: Effort normal. No respiratory distress. He has no wheezes. He has rales.  Abdominal: Soft. Bowel sounds are normal. He exhibits no distension. There is no tenderness. There is no rebound and no guarding.  Neurological: He is alert and oriented to person, place, and time.  Skin: Skin is warm. No erythema.    ED Course  Procedures (including critical care time) Labs Review Labs Reviewed  CBC WITH DIFFERENTIAL - Abnormal; Notable for the following:    WBC  15.3 (*)    RBC 4.04 (*)    HCT 38.4 (*)    MCH 34.2 (*)    RDW 15.8 (*)    Platelets 473 (*)    Neutro Abs 11.2 (*)    Monocytes Absolute 1.7 (*)    All other components within normal limits  COMPREHENSIVE METABOLIC PANEL - Abnormal; Notable for the following:    Potassium 3.2 (*)    Glucose, Bld 150 (*)    BUN 27 (*)    Creatinine, Ser 2.54 (*)    Calcium 7.6 (*)    Albumin 2.0 (*)    AST 57 (*)    Alkaline Phosphatase 168 (*)    GFR calc non Af Amer 27 (*)    GFR calc Af Amer 31 (*)    All other components within normal limits  PRO B NATRIURETIC PEPTIDE - Abnormal; Notable for the following:    Pro B Natriuretic peptide (BNP) 2009.0 (*)    All other components within normal limits  APTT - Abnormal; Notable for the following:    aPTT 41 (*)    All other components within normal limits  PROTIME-INR - Abnormal; Notable for the following:    Prothrombin Time 17.8 (*)    INR 1.51 (*)    All other components within normal limits  ETHANOL - Abnormal; Notable for the following:    Alcohol, Ethyl (B) 253 (*)    All other components within normal limits  I-STAT CG4 LACTIC ACID, ED - Abnormal; Notable for the following:    Lactic Acid, Venous 4.57 (*)    All other components within normal limits  URINE CULTURE  CULTURE, BLOOD (ROUTINE X 2)  CULTURE, BLOOD (ROUTINE X 2)  TROPONIN I  I-STAT CG4 LACTIC ACID, ED  I-STAT VENOUS BLOOD GAS, ED    Imaging Review Dg Chest 2 View  01/19/2014   CLINICAL DATA:  Short of breath.  Cough.  EXAM: CHEST - 2 VIEW  COMPARISON:  12/26/2013  FINDINGS: Moderate cardiomegaly. Vascular congestion. Stable right pleural effusion and basilar opacity. No pneumothorax.  IMPRESSION: Stable cardiomegaly, vascular congestion and, and right pleural effusion.   Electronically Signed   By: Maryclare Bean M.D.   On: 01/19/2014 02:11     EKG Interpretation   Date/Time:  Thursday January 19 2014 00:21:23 EDT Ventricular Rate:  116 PR Interval:    QRS Duration:  90 QT Interval:  324 QTC Calculation: 450 R Axis:   -49 Text Interpretation:  Atrial fibrillation Inferior infarct, old Anterior  infarct, old Confirmed by Kathrynn Humble, MD, Thelma Comp [phone]) on 01/19/2014 4:17:14  AM Also confirmed by Kathrynn Humble, MD, Thelma Comp [phone])  on 01/19/2014 4:17:30 AM      MDM   Final diagnoses:  Severe sepsis  Lactic acidosis  Alcoholic  AKI (acute kidney injury)  Elevated INR    Pt comes in with cc of  dib. Noted to be tachycardic, tachypnea, and hypoxic on room air. Pt is morbidly obese, and intoxicated, limiting the assessment slightly.  DDx: Sepsis syndrome ACS syndrome DKA CHF exacerbation COPD exacerbation Infection - pneumonia/UTI/Cellulitis PE Dehydration Electrolyte abnormality Tox syndrome   Pt's labs shows AKI, intoxication with etoh, elevated inr (he is not on any anticoagulants reportedly) and he is hypoxic - all signs of end organ damage. Exam not showing any source of infection. Although, with hypoxia, and cough, and previous cellulitis and poor hygiene habits - puts cellulitis and HCAP as the likely source. Pt has received 2+ liters of fluids, and vanc, cef, flagyl started - as he is sepsis of unknown source. Pan cultured.  Repeat lactate ordered. FM to admit - likely step down candidate.  CRITICAL CARE Performed by: Kashara Blocher   Total critical care time: 60 minutes  Critical care time was exclusive of separately billable procedures and treating other patients.  Critical care was necessary to treat or prevent imminent or life-threatening deterioration.  Critical care was time spent personally by me on the following activities: development of treatment plan with patient and/or surrogate as well as nursing, discussions with consultants, evaluation of patient's response to treatment, examination of patient, obtaining history from patient or surrogate, ordering and performing treatments and interventions, ordering and review of  laboratory studies, ordering and review of radiographic studies, pulse oximetry and re-evaluation of patient's condition.   Varney Biles, MD 01/19/14 0422  Varney Biles, MD 01/19/14 [phone]

## 2014-01-19 NOTE — H&P (Signed)
Seen and examined.  Discussed with Dr. Caryl Bis.  Agree with his admit, documentation and management.  Briefly, 58 yo complicated male with a host of problems including morbid obesity, alcoholism, recent job loss, and repeated hospitalizations presents with SOB, increased WBC, abnormal CXR, acute kidney injury and increased lactic acid.  Meets criteria for severe sepsis.  Source unclear.  Two most obvious candidates are skin (scrotum redness and thickened panis)  And pulm (pneumonia/empyema)  Given recent ICU admit, we are treating for HCAP which would also cover skin pathogens.  He is feeling better.  We will be agressive in WU (CT chest and thoracocentesis) because this is the second ICU/stepdown admit in 2 months.

## 2014-01-19 NOTE — Progress Notes (Signed)
Placed patient on nasal CPAP of 12cm. Patient tolerating well at this time.

## 2014-01-19 NOTE — Procedures (Addendum)
Successful US guided right thoracentesis. Yielded 930 mL of clear yellow fluid. Pt tolerated procedure well. No immediate complications.  Specimen was sent for labs. Pt is sent for CT chest as ordered by primary MD.  Ascencion Dike PA-C 01/19/2014 3:27 PM

## 2014-01-19 NOTE — ED Notes (Signed)
Pt reports he is unable to urinate at this time.

## 2014-01-19 NOTE — ED Notes (Signed)
Melanie Compton-RN and Dr. Kathrynn Humble were informed of elevated CG-4

## 2014-01-19 NOTE — Progress Notes (Signed)
ANTIBIOTIC CONSULT NOTE - INITIAL  Pharmacy Consult for Vancomycin Indication: rule out sepsis  Allergies  Allergen Reactions  . Augmentin [Amoxicillin-Pot Clavulanate] Nausea Only  . Potassium-Containing Compounds Other (See Comments)    Stomach Pain  . Neomycin Rash    Patient Measurements: Height: 5' 10.08" (178 cm) Weight: 383 lb 9.6 oz (174 kg) IBW/kg (Calculated) : 73.18 Adjusted Body Weight: 115 kg  Vital Signs: Temp: 97.8 F (36.6 C) (04/23 0231) Temp src: Oral (04/23 0231) BP: 123/53 mmHg (04/23 0515) Pulse Rate: 102 (04/23 0515) Intake/Output from previous day: 04/22 0701 - 04/23 0700 In: 2300 [I.V.:2300] Out: -  Intake/Output from this shift: Total I/O In: 2300 [I.V.:2300] Out: -   Labs:  Recent Labs  01/19/14 0201  WBC 15.3*  HGB 13.8  PLT 473*  CREATININE 2.54*   Estimated Creatinine Clearance: 52.1 ml/min (by C-G formula based on Cr of 2.54). No results found for this basename: VANCOTROUGH, VANCOPEAK, VANCORANDOM, GENTTROUGH, GENTPEAK, GENTRANDOM, TOBRATROUGH, TOBRAPEAK, TOBRARND, AMIKACINPEAK, AMIKACINTROU, AMIKACIN,  in the last 72 hours   Microbiology: No results found for this or any previous visit (from the past 720 hour(s)).  Medical History: Past Medical History  Diagnosis Date  . Atrial fibrillation, chronic   . Hypertension   . GERD (gastroesophageal reflux disease)   . Septic shock     Medications:  Lotensin  Diltiazem  Folic acid  Lasix  MVI  Thiamine  Assessment: 58 yo male with severe sepsis for empiric antibtiotics.  Vancomycin 1 g IV given in ED at 0300  Goal of Therapy:  Vancomycin trough level 15-20 mcg/ml  Plan:  Vancomycin 1250 mg IV q24h, first dose at [facility] Im[facility] Duffy 01/19/2014,5:40 AM

## 2014-01-19 NOTE — ED Notes (Addendum)
Dr. Caryl Bis at bedside.

## 2014-01-19 NOTE — ED Notes (Addendum)
PER EMS: pt from home and called EMS due to a fall while trying to get into his bed. Rug burn to knees but no other complaints from the fall. Upon EMS arrival pt was having SOB and pt reports SOB ongoing for a week with exertion. Pt placed on 4L [place] and O2 94%.  Hx of CHF and A-fib. HR-110's and BP-118/64. Also hx of septic shock from abscess in groin Oct 30, 2013. Lungs clear per EMS. Pt diaphoretic.  CBG-189, 98.2 temp.

## 2014-01-20 ENCOUNTER — Inpatient Hospital Stay (HOSPITAL_COMMUNITY): Payer: Managed Care, Other (non HMO)

## 2014-01-20 DIAGNOSIS — F1020 Alcohol dependence, uncomplicated: Secondary | ICD-10-CM

## 2014-01-20 DIAGNOSIS — E872 Acidosis, unspecified: Secondary | ICD-10-CM

## 2014-01-20 DIAGNOSIS — G4730 Sleep apnea, unspecified: Secondary | ICD-10-CM

## 2014-01-20 DIAGNOSIS — R6520 Severe sepsis without septic shock: Secondary | ICD-10-CM

## 2014-01-20 DIAGNOSIS — I4891 Unspecified atrial fibrillation: Secondary | ICD-10-CM

## 2014-01-20 DIAGNOSIS — A419 Sepsis, unspecified organism: Secondary | ICD-10-CM

## 2014-01-20 DIAGNOSIS — I10 Essential (primary) hypertension: Secondary | ICD-10-CM

## 2014-01-20 LAB — BASIC METABOLIC PANEL WITH GFR
BUN: 22 mg/dL (ref 6–23)
CO2: 25 meq/L (ref 19–32)
Calcium: 7.7 mg/dL — ABNORMAL LOW (ref 8.4–10.5)
Chloride: 107 meq/L (ref 96–112)
Creatinine, Ser: 1.16 mg/dL (ref 0.50–1.35)
GFR calc Af Amer: 80 mL/min — ABNORMAL LOW
GFR calc non Af Amer: 69 mL/min — ABNORMAL LOW
Glucose, Bld: 141 mg/dL — ABNORMAL HIGH (ref 70–99)
Potassium: 3.5 meq/L — ABNORMAL LOW (ref 3.7–5.3)
Sodium: 144 meq/L (ref 137–147)

## 2014-01-20 LAB — GLUCOSE, CAPILLARY
GLUCOSE-CAPILLARY: 105 mg/dL — AB (ref 70–99)
GLUCOSE-CAPILLARY: 121 mg/dL — AB (ref 70–99)
Glucose-Capillary: 105 mg/dL — ABNORMAL HIGH (ref 70–99)
Glucose-Capillary: 116 mg/dL — ABNORMAL HIGH (ref 70–99)
Glucose-Capillary: 122 mg/dL — ABNORMAL HIGH (ref 70–99)
Glucose-Capillary: 134 mg/dL — ABNORMAL HIGH (ref 70–99)

## 2014-01-20 MED ORDER — VANCOMYCIN HCL 10 G IV SOLR
1500.0000 mg | Freq: Two times a day (BID) | INTRAVENOUS | Status: DC
  Administered 2014-01-20 – 2014-01-21 (×3): 1500 mg via INTRAVENOUS
  Filled 2014-01-20 (×5): qty 1500

## 2014-01-20 MED ORDER — SODIUM CHLORIDE 0.9 % IV SOLN: 1500.0000 mg | Freq: Two times a day (BID) | INTRAVENOUS | Status: DC

## 2014-01-20 MED ORDER — POTASSIUM CHLORIDE CRYS ER 20 MEQ PO TBCR
40.0000 meq | EXTENDED_RELEASE_TABLET | Freq: Two times a day (BID) | ORAL | Status: DC
Start: 2014-01-20 — End: 2014-01-21
  Administered 2014-01-20 (×2): 40 meq via ORAL
  Filled 2014-01-20 (×4): qty 2

## 2014-01-20 MED ORDER — SODIUM CHLORIDE 0.45 % IV SOLN
INTRAVENOUS | Status: DC
  Administered 2014-01-20: 50 mL/h via INTRAVENOUS

## 2014-01-20 MED ORDER — DILTIAZEM HCL ER BEADS 240 MG PO CP24
240.0000 mg | ORAL_CAPSULE | Freq: Every day | ORAL | Status: DC
  Administered 2014-01-20 – 2014-01-23 (×4): 240 mg via ORAL
  Filled 2014-01-20 (×4): qty 1

## 2014-01-20 MED ORDER — LORAZEPAM 2 MG/ML IJ SOLN: 1.0000 mg | Freq: Once | INTRAMUSCULAR | Status: DC

## 2014-01-20 MED ORDER — VANCOMYCIN HCL 10 G IV SOLR
2500.0000 mg | Freq: Once | INTRAVENOUS | Status: AC
  Administered 2014-01-20: 2500 mg via INTRAVENOUS
  Filled 2014-01-20: qty 2500

## 2014-01-20 NOTE — Progress Notes (Signed)
ANTIBIOTIC CONSULT NOTE - FOLLOW UP  Pharmacy Consult for vancomycin Indication: sepsis  Allergies  Allergen Reactions  . Augmentin [Amoxicillin-Pot Clavulanate] Nausea Only  . Potassium-Containing Compounds Other (See Comments)    Stomach Pain  . Neomycin Rash    Patient Measurements: Height: 5\' 10"  (177.8 cm) Weight: 401 lb 0.3 oz (181.9 kg) IBW/kg (Calculated) : 73  Vital Signs: Temp: 98 F (36.7 C) (04/24 0746) Temp src: Oral (04/24 0746) BP: 150/86 mmHg (04/24 0746) Pulse Rate: 117 (04/24 0746) Intake/Output from previous day: 04/23 0701 - 04/24 0700 In: 2370 [P.O.:240; I.V.:1530; IV Piggyback:600] Out: 2250 [Urine:2250] Intake/Output from this shift: Total I/O In: -  Out: 400 [Urine:400]  Labs:  Recent Labs  01/19/14 0201 01/19/14 0557  WBC 15.3* 15.1*  HGB 13.8 13.5  PLT 473* 464*  CREATININE 2.54* 2.36*   Estimated Creatinine Clearance: 57.6 ml/min (by C-G formula based on Cr of 2.36).   Microbiology: Recent Results (from the past 720 hour(s))  CULTURE, BLOOD (ROUTINE X 2)     Status: None   Collection Time    01/19/14  2:43 AM      Result Value Ref Range Status   Specimen Description BLOOD RIGHT HAND   Final   Special Requests BOTTLES DRAWN AEROBIC AND ANAEROBIC 5CC   Final   Culture  Setup Time     Final   Value: 01/19/2014 09:40     Performed at [facility]   Culture     Final   Value:        BLOOD CULTURE RECEIVED NO GROWTH TO DATE CULTURE WILL BE HELD FOR 5 DAYS BEFORE ISSUING A FINAL NEGATIVE REPORT     Performed at [facility]   Report Status PENDING   Incomplete  CULTURE, BLOOD (ROUTINE X 2)     Status: None   Collection Time    01/19/14  2:47 AM      Result Value Ref Range Status   Specimen Description BLOOD LEFT HAND   Final   Special Requests BOTTLES DRAWN AEROBIC AND ANAEROBIC 5CC   Final   Culture  Setup Time     Final   Value: 01/19/2014 09:40     Performed at [facility]   Culture     Final   Value:        BLOOD CULTURE RECEIVED NO GROWTH TO DATE CULTURE WILL BE HELD FOR 5 DAYS BEFORE ISSUING A FINAL NEGATIVE REPORT     Performed at [facility]   Report Status PENDING   Incomplete  MRSA PCR SCREENING     Status: None   Collection Time    01/19/14  6:39 AM      Result Value Ref Range Status   MRSA by PCR NEGATIVE  NEGATIVE Final   Comment:            The GeneXpert MRSA Assay (FDA     approved for NASAL specimens     only), is one component of a     comprehensive MRSA colonization     surveillance program. It is not     intended to diagnose MRSA     infection nor to guide or     monitor treatment for     MRSA infections.    Anti-infectives   Start     Dose/Rate Route Frequency Ordered Stop   01/21/14 1000  vancomycin (VANCOCIN) 1,500 mg in sodium chloride 0.9 % 500 mL IVPB  1,500 mg 250 mL/hr over 120 Minutes Intravenous Every 12 hours 01/20/14 0904     01/20/14 1000  vancomycin (VANCOCIN) 2,500 mg in sodium chloride 0.9 % 500 mL IVPB     2,500 mg 250 mL/hr over 120 Minutes Intravenous  Once 01/20/14 0904     01/19/14 1000  vancomycin (VANCOCIN) 1,250 mg in sodium chloride 0.9 % 250 mL IVPB  Status:  Discontinued     1,250 mg 166.7 mL/hr over 90 Minutes Intravenous Every 24 hours 01/19/14 0546 01/20/14 0904   01/19/14 0300  ceFEPIme (MAXIPIME) 1 g in dextrose 5 % 50 mL IVPB     1 g 100 mL/hr over 30 Minutes Intravenous Every 12 hours 01/19/14 0234     01/19/14 0245  vancomycin (VANCOCIN) IVPB 1000 mg/200 mL premix     1,000 mg 200 mL/hr over 60 Minutes Intravenous  Once 01/19/14 0234 01/19/14 0426   01/19/14 0245  metroNIDAZOLE (FLAGYL) IVPB 500 mg     500 mg 100 mL/hr over 60 Minutes Intravenous  Once 01/19/14 0234 01/19/14 0426      Assessment: Mr. Puskas is a 58 yo M who presented with SOB and found to be in sepsis with multiple end organ damage. Pharmacy has been consulted to manage his vancomycin therapy. He is also receiving cefepime.  He was  previously given 1 g vancomycin in the ED at 0300 on 4/23, and received one dose of 1250mg  IV q 24h at 1100 on 4/23. However, he likely requires a higher dose, given his obesity. His labs from yesterday indicate AKI, so we will continue to monitor his renal function and adjust doses as need.   Labs from 4/23:  WBC 15.1 SCr 2.36 (0.98 in March 2015) Lactic acid 3.04 Afebrile  Blood cultures drawn 4/23 >> NGTD Urine culture obtained 4/23 >> sent  Goal of Therapy:  Vancomycin trough level 15-20 mcg/ml  Plan:  Give additional load of 2500mg  IV vancomycin x 1 dose today Begin vancomycin 1500mg  IV q 12 hours, starting tomorrow. Check vancomycin trough at steady state Check BMET with AM labs tomorrow to follow renal function trend Follow up cultures, clinical progression, length of therapy  Abdulla Pooley C. [place], PharmD Clinical Pharmacist-Resident Pager: [phone] Pharmacy: [phone] 01/20/2014 9:13 AM

## 2014-01-20 NOTE — Progress Notes (Signed)
Family Medicine Teaching Service Daily Progress Note Intern Pager: [phone]  Patient name: Matthew [name] Medical record number: [id number] Date of birth: [birth year] Age: 58 y.o. Gender: male  Primary Care Provider: Gwendolyn Fill, MD Consultants: None Code Status: Full code  Pt Overview and Major Events to Date:  4/23: patient admitted   Assessment and Plan: DYKE WEIBLE is a 58 y.o. male presenting with shortness of breath found to be in severe sepsis. PMH is significant for alcohol abuse, prior septic shock, DM, HTN, sleep apnea.   INFECTIOUS  A: Severe sepsis with evidence of end organ damage with elevated lactic acid, elevated Cr, likely resulting from HCAP (pleural effusion and infiltrate on CXR) vs cellulitis (with erythema on scrotum and noted firm panus). 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  P:  -continue broad spectrum antibiotics vanc and cefepime  -blood culture no growth to date -follow-up urine culture  PULMONARY  A: Patient with likely HCAP leading to severe sepsis. Hypoxemia and shortness of breath likely cause by potential HCAP. Also note right sided pleural effusion most likely related to infectious process. 922mL fluid retrieved from thoracentesis. CT chest showed resolving pneumonia. Also showed evidence of parapneumonic effusion with likely loculation. P:  -see above for antibiotics  -O2 via [place] for sats >92%  -continuous pulse ox  -monitor for decompensation   CARDIOVASCULAR  A: Patients initial MAP of 58 responded well to NS boluses. Does not appear to be in shock at this time as MAP up to 70 at time of evaluation. Note patient does have end organ damage.  Last echo with EF 65-70%, elevated pro-BNP (likely related to renal dysfunction)  Hx of HTN (Last BP 150/86) Afib currently in RVR with HR up to 130s Chest pain - location is atypical for cardiac cause,  though notes this with exertion and dyspnea, likely related to underlying PNA but resolved P:  -restart home diltiazem 240mg  -will give an additional 1 L NS bolus  -cardiac monitoring  -hold home antiHTN -repeat EKG in am   RENAL  A: AKI with previously normal creatinine, now elevated to 2.54. End organ damage from septic state.  Hypokalemia  Corrected calcium of 9.2  P:  -1/2 NS @50ml /hr -trend Cr on CMET  -will replete potassium 10 mEq x4 runs  -check magnesium   GASTROINTESTINAL  A: Mild elevation of AST with normal ALT in ~2:1 ratio possibly related to alcohol use. No evidence of shock liver at this time. Note INR mildly elevated at 1.51.  P:  -will keep NPO until stabilizes  -will trend LFTs and INRs to monitor for shock liver   HEME/ONC A: Leukocytosis likely stemming from an infectious process, though note this was not that much higher than his discharge WBC from February.  Thrombocytosis - likely an acute phase reactant  No therapeutic anticoagulation due to fall risk given alcoholism Evidence of chronic cirrhosis on CT. Multiple enlarged lymph nodes concerning for possible hepatic cancer P:  -antibiotics per above  -will trend WBC  -heparin SQ  -MRI today  ENDOCRINE  A: Hx of DM - glucose 150 on CMET, last A1c 5.6 11/03/13  P:  -SSI and q4 hr cbgs   NEUROLOGIC  A: Alcohol intoxication - ETOH level 253  P:  -place on CIWA protocol  -monitor for withdrawal   FEN/GI: Heart healthy, 1/2NS @ 82ml/hr PPx: subq heparin  Disposition: transfer to floor,  telemetry  Subjective:  Patient reports some irritation around his scrotal area, otherwise he feels better than before with no shortness of breath or chest pain.  Objective: Temp:  [97.8 F (36.6 C)-98.7 F (37.1 C)] 98 F (36.7 C) (04/24 0746) Pulse Rate:  [81-117] 117 (04/24 0746) Resp:  [18-24] 24 (04/24 0746) BP: (100-150)/(43-111) 150/86 mmHg (04/24 0746) SpO2:  [93 %-99 %] 96 % (04/24 0746) Weight:   [401 lb 0.3 oz (181.9 kg)] 401 lb 0.3 oz (181.9 kg) (04/24 0400)  Physical Exam: General: Sitting on bed, morbidly obese in no acute distress Cardiovascular: Irregular rhythm, tachycardic  Respiratory: Clear to auscultation bilaterally, no wheezing Abdomen: Soft, non-tender, obese, ~6cm skin growth that is non-tender Extremities: Chronic venous stasis changes in bilateral legs  Laboratory:  Recent Labs Lab 01/19/14 0201 01/19/14 0557  WBC 15.3* 15.1*  HGB 13.8 13.5  HCT 38.4* 37.8*  PLT 473* 464*    Recent Labs Lab 01/19/14 0201 01/19/14 0557 01/19/14 1705  NA 140 142  --   K 3.2* 3.3*  --   CL 99 103  --   CO2 23 23  --   BUN 27* 27*  --   CREATININE 2.54* 2.36*  --   CALCIUM 7.6* 7.3*  --   PROT 7.0 6.6 6.8  BILITOT 0.7 0.7  --   ALKPHOS 168* 156*  --   ALT 28 27  --   AST 57* 57*  --   GLUCOSE 150* 128*  --      Imaging/Diagnostic Tests: Dg Chest 2 View  01/19/2014   CLINICAL DATA:  Short of breath.  Cough.  EXAM: CHEST - 2 VIEW  COMPARISON:  12/26/2013  FINDINGS: Moderate cardiomegaly. Vascular congestion. Stable right pleural effusion and basilar opacity. No pneumothorax.  IMPRESSION: Stable cardiomegaly, vascular congestion and, and right pleural effusion.   Electronically Signed   By: Maryclare Bean M.D.   On: 01/19/2014 02:11   Ct Chest Wo Contrast  01/19/2014   CLINICAL DATA:  Persistent right lobe infiltrate.  EXAM: CT CHEST WITHOUT CONTRAST  TECHNIQUE: Multidetector CT imaging of the chest was performed following the standard protocol without IV contrast.  COMPARISON:  Chest x-ray 01/19/2014.  No prior chest CT.  FINDINGS: Mediastinum: Heart size is mildly enlarged. There is no significant pericardial fluid, thickening or pericardial calcification. There is atherosclerosis of the thoracic aorta, the great vessels of the mediastinum and the coronary arteries, including calcified atherosclerotic plaque in the left anterior descending coronary artery. Enlarged right  paraesophageal lymph node measuring 1.6 cm in short axis. Esophagus is unremarkable in appearance.  Lungs/Pleura: Areas of persistent airspace consolidation and atelectasis in the right lower lobe. Small right pleural effusion appears to be partially loculated laterally. Small amount of subsegmental atelectasis at the base of the left lung predominantly involving the left lower lobe. No suspicious appearing pulmonary nodules or masses.  Upper Abdomen: Shrunken appearance and nodular contour of the liver compatible with underlying cirrhosis. Moderate volume of perihepatic ascites. Spleen is not visualized, presumably surgically resected. Enlarged celiac axis lymph node measuring 2.9 cm. Enlarged portacaval lymph nodes measuring up to 2.1 cm. Hepatoduodenal ligament lymph node also enlarged measuring 2.1 cm. Enlarged retroperitoneal lymph nodes. Incompletely visualized lesion in the upper right retroperitoneum, favored to represent an exophytic right renal cyst, however, this is incompletely characterized. 1.8 cm indeterminate right adrenal nodule.  Musculoskeletal: There are no aggressive appearing lytic or blastic lesions noted in the visualized portions of the skeleton.  IMPRESSION:  1. Persistent areas of atelectasis and consolidation in the basal segments of the right lower lobe, likely to represent a resolving pneumonia. There is a small parapneumonic pleural effusion which is likely partially loculated. 2. Evidence of advanced cirrhosis with perihepatic ascites and numerous enlarged lymph nodes throughout the upper abdomen, as well as an enlarged paraesophageal lymph node. Clinical correlation for evidence of malignancy is suggested, with further evaluation of the liver with MRI with and without IV gadolinium to exclude hepatoma if clinically appropriate. 3. In addition, there are other indeterminate findings in the upper abdomen, including an indeterminate 1.8 cm right adrenal nodule, as well as an  indeterminate lesion in the retroperitoneum which may represent an exophytic lesion from the upper pole of the right kidney. These findings could also be further evaluated with MRI of the abdomen with and without IV gadolinium. 4. Mild cardiomegaly. 5. Atherosclerosis, including left anterior descending coronary artery disease. Please note that although the presence of coronary artery calcium documents the presence of coronary artery disease, the severity of this disease and any potential stenosis cannot be assessed on this non-gated CT examination. Assessment for potential risk factor modification, dietary therapy or pharmacologic therapy may be warranted, if clinically indicated.   Electronically Signed   By: Vinnie Langton M.D.   On: 01/19/2014 17:51   US Thoracentesis Asp Pleural Space W/img Guide  01/19/2014   CLINICAL DATA:  Shortness of breath. Right pleural effusion. Request for diagnostic and therapeutic thoracentesis  EXAM: ULTRASOUND GUIDED RIGHT THORACENTESIS  COMPARISON:  None.  PROCEDURE: An ultrasound guided thoracentesis was thoroughly discussed with the patient and questions answered. The benefits, risks, alternatives and complications were also discussed. The patient understands and wishes to proceed with the procedure. Written consent was obtained.  Ultrasound was performed to localize and mark an adequate pocket of fluid in the right chest. The area was then prepped and draped in the normal sterile fashion. 1% Lidocaine was used for local anesthesia. Under ultrasound guidance a 19 gauge Yueh catheter was introduced. Thoracentesis was performed. The catheter was removed and a dressing applied.  Complications:  None immediate  FINDINGS: A total of approximately 930 mL of clear yellow fluid was removed. A fluid sample was sent for laboratory analysis.  IMPRESSION: Successful ultrasound guided right thoracentesis yielding 930 mL of pleural fluid.  Read by Ascencion Dike PA-C   Electronically  Signed   By: Arne Cleveland M.D.   On: 01/19/2014 15:31    Cordelia Poche, MD 01/20/2014, 9:45 AM PGY-1, [facility] Intern pager: [phone], text pages welcome

## 2014-01-20 NOTE — Progress Notes (Signed)
Pt transferred per bed to room [address] with all belongings including cell phone and charger.

## 2014-01-20 NOTE — Progress Notes (Signed)
RT placed distilled water in CPAP's humidifier per patient request. The machine is on and the pressure is set but He said he will put the mask on by himself.

## 2014-01-20 NOTE — Progress Notes (Signed)
Called report to [employer] on [place].

## 2014-01-20 NOTE — Progress Notes (Signed)
[facility] called Anerobic blood culture was positive for Gram negative rods. Call made to MD to notify.

## 2014-01-20 NOTE — Progress Notes (Signed)
MRI called says that there will not be visualization of liver due to pt's size. Call placed to MD to make aware. MRI suggested that MD call radiologist to see if there is any better test to visualize liver. Pt has already had a CT done

## 2014-01-20 NOTE — Progress Notes (Signed)
Seen and examined.  Discussed with Dr. Lonny Prude.  Agree with his documentation and management.  I had a "come to [place]" talk with Matthew [name].  He is now aware of his significant cirrhosis (INR=1.5, Alb 1.9), morbid obesity and recurrent sepsis - very poor prognosis.  He is aware that the factors under his control will be DC alcohol, weight loss (do not diet while acutely ill) and close follow up with docs.  We also need to follow up abdominal lymphadendopathy and a positive anaerobic blood culture.

## 2014-01-20 NOTE — Progress Notes (Signed)
Interim Progress Note  Patient found to have gram negative rods in blood culture (x1 draw). Spoke with pharmacy and recommended to continue cefepime since it has coverage for pseudomonas and klebsiella. Will continue current antibiotic regimen.  Cordelia Poche, MD PGY-1, [facility] 01/20/2014, 12:46 PM Pager: [phone]

## 2014-01-21 DIAGNOSIS — E119 Type 2 diabetes mellitus without complications: Secondary | ICD-10-CM

## 2014-01-21 LAB — BASIC METABOLIC PANEL
BUN: 17 mg/dL (ref 6–23)
CALCIUM: 7.9 mg/dL — AB (ref 8.4–10.5)
CHLORIDE: 111 meq/L (ref 96–112)
CO2: 25 mEq/L (ref 19–32)
Creatinine, Ser: 0.83 mg/dL (ref 0.50–1.35)
GFR calc non Af Amer: >90 mL/min (ref >90)
Glucose, Bld: 110 mg/dL — ABNORMAL HIGH (ref 70–99)
Potassium: 3.8 mEq/L (ref 3.7–5.3)
Sodium: 146 mEq/L (ref 137–147)

## 2014-01-21 LAB — GLUCOSE, CAPILLARY
GLUCOSE-CAPILLARY: 112 mg/dL — AB (ref 70–99)
GLUCOSE-CAPILLARY: 123 mg/dL — AB (ref 70–99)
GLUCOSE-CAPILLARY: 160 mg/dL — AB (ref 70–99)
GLUCOSE-CAPILLARY: 91 mg/dL (ref 70–99)
Glucose-Capillary: 137 mg/dL — ABNORMAL HIGH (ref 70–99)

## 2014-01-21 LAB — CBC
HEMATOCRIT: 37.1 % — AB (ref 39.0–52.0)
Hemoglobin: 12.9 g/dL — ABNORMAL LOW (ref 13.0–17.0)
MCH: 33.9 pg (ref 26.0–34.0)
MCHC: 34.8 g/dL (ref 30.0–36.0)
MCV: 97.4 fL (ref 78.0–100.0)
PLATELETS: 440 10*3/uL — AB (ref 150–400)
RBC: 3.81 MIL/uL — ABNORMAL LOW (ref 4.22–5.81)
RDW: 15.9 % — AB (ref 11.5–15.5)
WBC: 15.5 10*3/uL — AB (ref 4.0–10.5)

## 2014-01-21 LAB — URINE CULTURE
Colony Count: 8000
SPECIAL REQUESTS: NORMAL

## 2014-01-21 MED ORDER — POTASSIUM CHLORIDE CRYS ER 20 MEQ PO TBCR
40.0000 meq | EXTENDED_RELEASE_TABLET | Freq: Two times a day (BID) | ORAL | Status: AC
  Administered 2014-01-21: 40 meq via ORAL

## 2014-01-21 NOTE — Discharge Summary (Signed)
[facility] Discharge Summary  Patient name: Matthew [name] Medical record number: [id number] Date of birth: 05/17/1956 Age: 58 y.o. Gender: male Date of Admission: 01/19/2014  Date of Discharge: 01/23/2014 Admitting Physician: [name], MD  Primary Care Provider: Gwendolyn Fill, MD Consultants: None  Indication for Hospitalization: Sepsis  Discharge Diagnoses/Problem List:  1. Sepsis 2. HCAP 3. AKI 4. Cirrhosis 5. Abdominal lymphadenopathy 6. Atrial fibrillation with RVR 7. Impetigo 8. Alcohol abuse  Disposition: Discharge home  Discharge Condition: Stable  Discharge Exam:  General: adult male, sitting at bedside, morbidly obese; in no acute distress  Cardiovascular: Irregularly irregular rhythm, regular rate, no murmur appreciated  Respiratory: CTAB, no wheezing  Abdomen: Soft, non-tender, obese; right-lateral ~6cm skin growth, non-tender  Extremities: unchanged chronic venous stasis changes in bilateral legs  Brief Hospital Course:   HPI: Matthew [name] is a 58 y.o. male presenting with shortness of breath found to have severe sepsis.  Patient notes he started not feeling well on Monday. He had shortness of breath at that time. Found to have temp to 103 F. Notes EMS evaluated him at home and advised hydration. He notes his shortness of breath typically occurs with exertion. It is accompanied by right sided chest pain that is a pressure. This does not radiate. Occurs intermittently. Occasionally exertion makes the pain worse, though not always. He has endorses some sweating at night with his fevers. Has had a non-productive cough over this time as well. Denies vomiting, though has had dry heaves for a long time. He denies abdominal pain and known skin lesions. He endorses chronic LE edema that is stable. He is an alcoholic and had gone 3-4 weeks without a drink until last night when he consumed a fifth of alcohol.  In the ED he  was noted to be tachycardic, tachypneic, and hypoxic. A CXR revealed an unchanged (from one month ago) right sided pleural effusion and infiltrate. Lactic acid elevated to 4.57, INR to 1.51, and Cr to 2.54. Mildly elevated AST to 57 and normal ALT 28. He had a negative troponin. His MAP in the ED initially was noted to be 64 and dropped to 58 prior to receiving any fluid. S/p 2 1 L NS boluses his map was 70 on my evaluation.  Sepsis: Patient given fluids, which improved BP and MAP. Blood pressure medication discontinued on admission. Patient initially added to step down due to concern for rapid decline. Blood cultures and urine cultures obtained. Urine culture revealed insignificant growth. Blood culture management in below problem. Broad spectrum antibiotics started. Patient's blood pressure improved, along with overe picture and he was transferred to the floor.   Bactermia: Patient found to have e. Coli in blood x1. Bacteria pan-sensitive. Patient received about 3 days of vancomycin and cefepime. Patient remained afebrile and was transitioned to Keflex and discharged with 2 week total course.  HCAP: Found to have atelectasis vs infiltrate in bibasilar lung fields and effusion. Patient tapped and 930cc of transudated extracted. Management as in "Bactermia" problem. Patient initially on 4L O2 for shortness of breath, which improved to no oxygen requirement. PT evaluated no follow-up recommended  AKI: initial creatinine of 2.54 with baseline <1. Patient given IV fluids and creatinine improved to 0.83 before discharge.  Cirrhosis: found on CT of abdomen. No management inpatient  Abdominal lymphadenopathy: attempted to obtain MRI while inpatient, however, MRI could not accommodate patient's weight/height. Patient aware that this is something he should follow-up on. Concern for malignancy  and would recommend getting an outpatient workup.   Atrial fibrillation with RVR: patient's home diltiazem initially  discontinued. Patient had RVR up to 130s and home diltiazem restarted. Atrial fibrillation remained rate controlled.  Impetigo: management by wound council. Patient has outpatient wound care set up.  Alcohol: CIWA. Did not require ativan.  Issues for Follow Up:  1. Abdominal lymphadenopathy. Concerning for oncologic process, including liver cancer. Recommend outpatient MRI. Was told by hospital MRI tech that [facility] has an open MRI. 2. Repeat CBC as patient has isolated leukocytosis 3. Benazepril discontinued on discharge due to AKI, consider alternate hypertensive agent 4. Outpatient wound  Significant Procedures: Thoracentesis  Significant Labs and Imaging:   Recent Labs Lab 01/19/14 0557 01/21/14 0608 01/23/14 0445  WBC 15.1* 15.5* 16.7*  HGB 13.5 12.9* 12.9*  HCT 37.8* 37.1* 36.7*  PLT 464* 440* 422*    Recent Labs Lab 01/19/14 0201 01/19/14 0557 01/20/14 1115 01/21/14 0608 01/22/14 0430 01/23/14 0445  NA 140 142 144 146 146 143  K 3.2* 3.3* 3.5* 3.8 4.5 4.3  CL 99 103 107 111 109 106  CO2 23 23 25 25 26 28   GLUCOSE 150* 128* 141* 110* 101* 109*  BUN 27* 27* 22 17 14 12   CREATININE 2.54* 2.36* 1.16 0.83 0.82 0.83  CALCIUM 7.6* 7.3* 7.7* 7.9* 8.0* 8.4  MG  --  1.6  --   --   --   --   ALKPHOS 168* 156*  --   --  148*  --   AST 57* 57*  --   --  72*  --   ALT 28 27  --   --  29  --   ALBUMIN 2.0* 1.9*  --   --  1.9*  --     Dg Chest 2 View  01/20/2014   CLINICAL DATA:  Pneumonia.  EXAM: CHEST  2 VIEW  COMPARISON:  CT CHEST W/O CM dated 01/19/2014; DG CHEST 2 VIEW dated 01/19/2014  FINDINGS: Mediastinum hilar structures normal. Stable cardiomegaly and mild pulmonary vascular prominence. Persistent bibasilar atelectasis and/or infiltrate, projected right lung base with associated small right pleural effusion. Tiny left pleural effusion cannot be excluded. No pneumothorax. No acute osseous abnormality.  IMPRESSION: 1. Persistent cardiomegaly pulmonary  vascular congestion. A component of CHF may be present . 2. Persistent bibasilar atelectasis and/or infiltrates, particularly within the right lung base. Associated small right pleural effusion. Tiny left pleural effusion cannot be excluded.   Electronically Signed   By: Marcello Moores  Register   On: 01/20/2014 07:48   Dg Chest 2 View  01/19/2014   CLINICAL DATA:  Short of breath.  Cough.  EXAM: CHEST - 2 VIEW  COMPARISON:  12/26/2013  FINDINGS: Moderate cardiomegaly. Vascular congestion. Stable right pleural effusion and basilar opacity. No pneumothorax.  IMPRESSION: Stable cardiomegaly, vascular congestion and, and right pleural effusion.   Electronically Signed   By: Maryclare Bean M.D.   On: 01/19/2014 02:11   Ct Chest Wo Contrast  01/19/2014   CLINICAL DATA:  Persistent right lobe infiltrate.  EXAM: CT CHEST WITHOUT CONTRAST  TECHNIQUE: Multidetector CT imaging of the chest was performed following the standard protocol without IV contrast.  COMPARISON:  Chest x-ray 01/19/2014.  No prior chest CT.  FINDINGS: Mediastinum: Heart size is mildly enlarged. There is no significant pericardial fluid, thickening or pericardial calcification. There is atherosclerosis of the thoracic aorta, the great vessels of the mediastinum and the coronary arteries, including calcified atherosclerotic plaque in the  left anterior descending coronary artery. Enlarged right paraesophageal lymph node measuring 1.6 cm in short axis. Esophagus is unremarkable in appearance.  Lungs/Pleura: Areas of persistent airspace consolidation and atelectasis in the right lower lobe. Small right pleural effusion appears to be partially loculated laterally. Small amount of subsegmental atelectasis at the base of the left lung predominantly involving the left lower lobe. No suspicious appearing pulmonary nodules or masses.  Upper Abdomen: Shrunken appearance and nodular contour of the liver compatible with underlying cirrhosis. Moderate volume of perihepatic  ascites. Spleen is not visualized, presumably surgically resected. Enlarged celiac axis lymph node measuring 2.9 cm. Enlarged portacaval lymph nodes measuring up to 2.1 cm. Hepatoduodenal ligament lymph node also enlarged measuring 2.1 cm. Enlarged retroperitoneal lymph nodes. Incompletely visualized lesion in the upper right retroperitoneum, favored to represent an exophytic right renal cyst, however, this is incompletely characterized. 1.8 cm indeterminate right adrenal nodule.  Musculoskeletal: There are no aggressive appearing lytic or blastic lesions noted in the visualized portions of the skeleton.  IMPRESSION: 1. Persistent areas of atelectasis and consolidation in the basal segments of the right lower lobe, likely to represent a resolving pneumonia. There is a small parapneumonic pleural effusion which is likely partially loculated. 2. Evidence of advanced cirrhosis with perihepatic ascites and numerous enlarged lymph nodes throughout the upper abdomen, as well as an enlarged paraesophageal lymph node. Clinical correlation for evidence of malignancy is suggested, with further evaluation of the liver with MRI with and without IV gadolinium to exclude hepatoma if clinically appropriate. 3. In addition, there are other indeterminate findings in the upper abdomen, including an indeterminate 1.8 cm right adrenal nodule, as well as an indeterminate lesion in the retroperitoneum which may represent an exophytic lesion from the upper pole of the right kidney. These findings could also be further evaluated with MRI of the abdomen with and without IV gadolinium. 4. Mild cardiomegaly. 5. Atherosclerosis, including left anterior descending coronary artery disease. Please note that although the presence of coronary artery calcium documents the presence of coronary artery disease, the severity of this disease and any potential stenosis cannot be assessed on this non-gated CT examination. Assessment for potential risk  factor modification, dietary therapy or pharmacologic therapy may be warranted, if clinically indicated.   Electronically Signed   By: Vinnie Langton M.D.   On: 01/19/2014 17:51   US Thoracentesis Asp Pleural Space W/img Guide  01/19/2014   CLINICAL DATA:  Shortness of breath. Right pleural effusion. Request for diagnostic and therapeutic thoracentesis  EXAM: ULTRASOUND GUIDED RIGHT THORACENTESIS  COMPARISON:  None.  PROCEDURE: An ultrasound guided thoracentesis was thoroughly discussed with the patient and questions answered. The benefits, risks, alternatives and complications were also discussed. The patient understands and wishes to proceed with the procedure. Written consent was obtained.  Ultrasound was performed to localize and mark an adequate pocket of fluid in the right chest. The area was then prepped and draped in the normal sterile fashion. 1% Lidocaine was used for local anesthesia. Under ultrasound guidance a 19 gauge Yueh catheter was introduced. Thoracentesis was performed. The catheter was removed and a dressing applied.  Complications:  None immediate  FINDINGS: A total of approximately 930 mL of clear yellow fluid was removed. A fluid sample was sent for laboratory analysis.  IMPRESSION: Successful ultrasound guided right thoracentesis yielding 930 mL of pleural fluid.  Read by Ascencion Dike PA-C   Electronically Signed   By: Arne Cleveland M.D.   On: 01/19/2014 15:31  Results/Tests Pending at Time of Discharge: None  Discharge Medications:    Medication List    STOP taking these medications       benazepril 20 MG tablet  Commonly known as:  LOTENSIN     furosemide 20 MG tablet  Commonly known as:  LASIX      TAKE these medications       cephALEXin 500 MG capsule  Commonly known as:  KEFLEX  Take 1 capsule (500 mg total) by mouth 4 (four) times daily.     diltiazem 240 MG 24 hr capsule  Commonly known as:  TIAZAC  Take 240 mg by mouth daily.     folic acid 1 MG  tablet  Commonly known as:  FOLVITE  Take 1 tablet (1 mg total) by mouth daily.     multivitamin with minerals Tabs tablet  Take 1 tablet by mouth daily.     mupirocin cream 2 %  Commonly known as:  BACTROBAN  Apply topically daily.     thiamine 100 MG tablet  Take 1 tablet (100 mg total) by mouth daily.        Discharge Instructions: Please refer to Patient Instructions section of EMR for full details.  Patient was counseled important signs and symptoms that should prompt return to medical care, changes in medications, dietary instructions, activity restrictions, and follow up appointments.   Follow-Up Appointments: Follow-up Information   Follow up with Gwendolyn Fill, MD On 01/24/2014. (11:00AM, For hospital follow-up)    Specialty:  Family Medicine   Contact information:   [address] [place] [address] [phone]       Follow up with [place]              On 02/07/2014. (@ 9:30)    Contact information:   [address] [address] [phone]      Cordelia Poche, MD 01/25/2014, 1:06 PM PGY-1, [place]

## 2014-01-21 NOTE — Progress Notes (Signed)
Family Medicine Teaching Service Daily Progress Note Intern Pager: [phone]  Patient name: Matthew [name] Medical record number: [id number] Date of birth: [birth year] Age: 58 y.o. Gender: male  Primary Care Provider: Gwendolyn Fill, MD Consultants: None Code Status: Full code  Pt Overview and Major Events to Date:  4/23: patient admitted   Assessment and Plan: Matthew [name] is a 58 y.o. male presenting with shortness of breath found to be in severe sepsis. PMH is significant for alcohol abuse, prior septic shock, DM, HTN, sleep apnea.   INFECTIOUS  A: Severe sepsis with evidence of end organ damage with elevated lactic acid, elevated Cr, likely resulting from HCAP (pleural effusion and infiltrate on CXR) vs cellulitis (with erythema on scrotum and noted firm panus). 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 Urine culture: insignificant growth P:  -continue broad spectrum antibiotics vanc and cefepime  -blood culture gram negative rods x1 sample  PULMONARY  A: Patient with likely HCAP leading to severe sepsis. Hypoxemia and shortness of breath likely cause by potential HCAP. Also note right sided pleural effusion most likely related to infectious process. 94mL fluid retrieved from thoracentesis. CT chest showed resolving pneumonia. Also showed evidence of parapneumonic effusion with likely loculation. Patient currently stable on room air P:  -see above for antibiotics  -O2 via [place] for sats >92%  -continuous pulse ox  -monitor for decompensation   CARDIOVASCULAR  A: Patients initial MAP of 58 responded well to NS boluses. Does not appear to be in shock at this time as MAP up to 70 at time of evaluation. Note patient does have end organ damage.  Last echo with EF 65-70%, elevated pro-BNP (likely related to renal dysfunction)  Hx of HTN (Last BP 117/64) Afib with RVR resolved, last HR of 94  but less than 110 in last 24 hours Chest pain - location is atypical for cardiac cause, though notes this with exertion and dyspnea, likely related to underlying PNA but resolved P:  -continue home diltiazem 240mg  -will give an additional 1 L NS bolus  -cardiac monitoring  -hold home antiHTN  RENAL  A: AKI with previously normal creatinine, now elevated to 2.54. End organ damage from septic state. Creatinine currently improved with resolution of AKI. Hypokalemia, s/p potassium, now resolved Corrected calcium of 9.2  P:  - discontinue IVF, saline lock  GASTROINTESTINAL  A: Mild elevation of AST with normal ALT in ~2:1 ratio possibly related to alcohol use. No evidence of shock liver at this time. Note INR mildly elevated at 1.51. P:  -cardiac diet  HEME/ONC A: Leukocytosis likely stemming from an infectious process, though note this was not that much higher than his discharge WBC from February.  Thrombocytosis - likely an acute phase reactant  No therapeutic anticoagulation due to fall risk given alcoholism Evidence of chronic cirrhosis on CT. Multiple enlarged lymph nodes concerning for possible hepatic cancer. MRI not successful inpatient. May pursue outpatient workup P:  -antibiotics per above  -will trend WBC  -heparin SQ    ENDOCRINE  A: Hx of DM - glucose 150 on CMET, last A1c 5.6 11/03/13  P:  -SSI and q4 hr cbgs   NEUROLOGIC  A: Alcohol intoxication - ETOH level 253. Has not required ativan. P:  -Continue CIWA protocol  -monitor for withdrawal  FEN: saline lock PPx: subq heparin  Disposition: transfer to floor, telemetry  Subjective:  Patient feels great  today. Discomfort in scrotum improving. NO shortness of breath, chest pain, lightheadedness. No fevers overnight. Still urinating every 1.5 hours. He is eager to go home and start making changes to his lifestyle.  Objective: Temp:  [98 F (36.7 C)-98.7 F (37.1 C)] 98.7 F (37.1 C) (04/25 0414) Pulse Rate:   [94-117] 94 (04/25 0414) Resp:  [18-24] 18 (04/25 0414) BP: (117-150)/(64-86) 117/64 mmHg (04/25 0414) SpO2:  [94 %-97 %] 94 % (04/25 0414) Weight:  [399 lb 0.5 oz (181 kg)] 399 lb 0.5 oz (181 kg) (04/24 1541)  Physical Exam: General: Sitting on bed, morbidly obese in no acute distress Cardiovascular: Irregularly irregular rhythm, regular rate, no murmur Respiratory: Clear to auscultation bilaterally, no wheezing Abdomen: Soft, non-tender, obese, ~6cm skin growth that is non-tender Extremities: Chronic venous stasis changes in bilateral legs  Laboratory:  Recent Labs Lab 01/19/14 0201 01/19/14 0557 01/21/14 0608  WBC 15.3* 15.1* 15.5*  HGB 13.8 13.5 12.9*  HCT 38.4* 37.8* 37.1*  PLT 473* 464* 440*    Recent Labs Lab 01/19/14 0201 01/19/14 0557 01/19/14 1705 01/20/14 1115 01/21/14 0608  NA 140 142  --  144 146  K 3.2* 3.3*  --  3.5* 3.8  CL 99 103  --  107 111  CO2 23 23  --  25 25  BUN 27* 27*  --  22 17  CREATININE 2.54* 2.36*  --  1.16 0.83  CALCIUM 7.6* 7.3*  --  7.7* 7.9*  PROT 7.0 6.6 6.8  --   --   BILITOT 0.7 0.7  --   --   --   ALKPHOS 168* 156*  --   --   --   ALT 28 27  --   --   --   AST 57* 57*  --   --   --   GLUCOSE 150* 128*  --  141* 110*     Imaging/Diagnostic Tests: Dg Chest 2 View  01/20/2014   CLINICAL DATA:  Pneumonia.  EXAM: CHEST  2 VIEW  COMPARISON:  CT CHEST W/O CM dated 01/19/2014; DG CHEST 2 VIEW dated 01/19/2014  FINDINGS: Mediastinum hilar structures normal. Stable cardiomegaly and mild pulmonary vascular prominence. Persistent bibasilar atelectasis and/or infiltrate, projected right lung base with associated small right pleural effusion. Tiny left pleural effusion cannot be excluded. No pneumothorax. No acute osseous abnormality.  IMPRESSION: 1. Persistent cardiomegaly pulmonary vascular congestion. A component of CHF may be present . 2. Persistent bibasilar atelectasis and/or infiltrates, particularly within the right lung base.  Associated small right pleural effusion. Tiny left pleural effusion cannot be excluded.   Electronically Signed   By: Marcello Moores  Register   On: 01/20/2014 07:48    Cordelia Poche, MD 01/21/2014, 6:51 AM PGY-1, [place] Intern pager: [phone], text pages welcome

## 2014-01-22 DIAGNOSIS — R791 Abnormal coagulation profile: Secondary | ICD-10-CM

## 2014-01-22 DIAGNOSIS — N179 Acute kidney failure, unspecified: Secondary | ICD-10-CM

## 2014-01-22 DIAGNOSIS — E669 Obesity, unspecified: Secondary | ICD-10-CM

## 2014-01-22 LAB — GLUCOSE, CAPILLARY
GLUCOSE-CAPILLARY: 93 mg/dL (ref 70–99)
GLUCOSE-CAPILLARY: 104 mg/dL — AB (ref 70–99)
Glucose-Capillary: 127 mg/dL — ABNORMAL HIGH (ref 70–99)
Glucose-Capillary: 133 mg/dL — ABNORMAL HIGH (ref 70–99)
Glucose-Capillary: 136 mg/dL — ABNORMAL HIGH (ref 70–99)
Glucose-Capillary: 99 mg/dL (ref 70–99)

## 2014-01-22 LAB — COMPREHENSIVE METABOLIC PANEL
ALT: 29 U/L (ref 0–53)
AST: 72 U/L — ABNORMAL HIGH (ref 0–37)
Albumin: 1.9 g/dL — ABNORMAL LOW (ref 3.5–5.2)
Alkaline Phosphatase: 148 U/L — ABNORMAL HIGH (ref 39–117)
BUN: 14 mg/dL (ref 6–23)
CALCIUM: 8 mg/dL — AB (ref 8.4–10.5)
CHLORIDE: 109 meq/L (ref 96–112)
CO2: 26 mEq/L (ref 19–32)
Creatinine, Ser: 0.82 mg/dL (ref 0.50–1.35)
GFR calc Af Amer: >90 mL/min (ref >90)
GFR calc non Af Amer: >90 mL/min (ref >90)
GLUCOSE: 101 mg/dL — AB (ref 70–99)
POTASSIUM: 4.5 meq/L (ref 3.7–5.3)
Sodium: 146 mEq/L (ref 137–147)
TOTAL PROTEIN: 6.6 g/dL (ref 6.0–8.3)
Total Bilirubin: 1.3 mg/dL — ABNORMAL HIGH (ref 0.3–1.2)

## 2014-01-22 LAB — CULTURE, BLOOD (ROUTINE X 2)

## 2014-01-22 MED ORDER — CEPHALEXIN 500 MG PO CAPS: 500.0000 mg | ORAL_CAPSULE | Freq: Four times a day (QID) | ORAL | Status: DC

## 2014-01-22 MED ORDER — CEPHALEXIN 500 MG PO CAPS
500.0000 mg | ORAL_CAPSULE | Freq: Four times a day (QID) | ORAL | Status: DC
  Administered 2014-01-22 – 2014-01-23 (×5): 500 mg via ORAL
  Filled 2014-01-22 (×8): qty 1

## 2014-01-22 MED ORDER — CEFEPIME HCL 1 G IJ SOLR
1.0000 g | Freq: Two times a day (BID) | INTRAMUSCULAR | Status: AC
  Filled 2014-01-22: qty 1

## 2014-01-22 NOTE — Progress Notes (Signed)
Family Medicine Teaching Service Daily Progress Note Intern Pager: [phone]  Patient name: Matthew [name] Medical record number: [id number] Date of birth: [birth year] Age: 58 y.o. Gender: male  Primary Care Provider: Gwendolyn Fill, MD Consultants: None Code Status: Full code  Pt Overview and Major Events to Date:  4/23: patient admitted; BP's improved with fluid, blood Cx drawn, started vanc / cefepime  Wound care consult - scrotom erythema, pannus skin maceration, likely intertrigo --> local dressing / care  US-guided thoracentesis with 930 mL clear yellow fluid from right pleural space 4/24: CT Chest - resolving pneumonia, ?loculation of parapneumonic effusion  Gram NEGATIVE rods on blood Cx; pleural tap looks transudative 4/25: generally improving, out of stepdown 4/26: Pan-sensitive E.coli on blood Cx; transition to PO Keflex   Assessment and Plan: Matthew [name] is a 58 y.o. male presenting with shortness of breath found to be in severe sepsis. PMH is significant for alcohol abuse, prior septic shock, DM, HTN, sleep apnea.   INFECTIOUS  A: Severe sepsis with evidence of end organ damage with elevated lactic acid, elevated Cr, likely resulting from HCAP (pleural effusion and infiltrate on CXR) vs cellulitis (with erythema on scrotum and noted firm panus). Blood cx - pansensitive E.coli. WBC count continues to be elevated and trending up for unknown reason. P:  -Keflex PO (4/26 is day 4 of likely 10-14 day total course) -continue local care for intertrigo -consider outpt wound referral for scrotum  PULMONARY  A: Patient with likely HCAP leading to severe sepsis - sepsis now resolved. Hypoxemia and shortness of breath, now improved likely cause by potential HCAP. 97mL fluid retrieved from thoracentesis. CT chest showed resolving pneumonia. Also showed evidence of parapneumonic effusion with likely loculation.  P:  -abx as above -O2 via [place] for sats >92%, continue pulse ox  monitoring -night time CPAP (home regimen)  CARDIOVASCULAR  A: Patients initial MAP of 58 responded well to NS boluses. Last echo with EF 65-70%, elevated pro-BNP (likely related to renal dysfunction)  Hx of HTN Hx Afib, RVR resolved Chest pain - intermittent / resolved; atypical for cardiac cause, though notes this with exertion and dyspnea, likely related to underlying PNA P:  -continue home diltiazem 240mg  -hold home antiHTN meds (Lotensin, Lasix); ?restart on discharge  RENAL  A: AKI Cr elevated to 2.54, now WNL, likely end organ damage from septic state.  Hypokalemia, s/p potassium, now resolved Corrected calcium of 9.2  P:  - trend chemistries - saline lock  GASTROINTESTINAL  A: Mild elevation of AST with normal ALT in ~2:1 ratio possibly related to alcohol use. No evidence of shock liver at this time. Note INR mildly elevated at 1.51 (autoanticoagulated likely to chronic liver disease) P:  -cardiac diet  HEME/ONC A: Leukocytosis likely stemming from an infectious process, though note this was not that much higher than his discharge WBC from February.  Thrombocytosis - likely an acute phase reactant  No therapeutic anticoagulation due to fall risk given alcoholism Evidence of chronic cirrhosis on CT. Multiple enlarged lymph nodes concerning for possible hepatic cancer. MRI not successful inpatient. May pursue outpatient workup P:  -antibiotics per above  -will trend WBC  -heparin SQ   ENDOCRINE  A: Hx of DM- last A1c 5.6 11/03/13  P:  -SSI; change CBG's to qAC+HS  NEUROLOGIC  A: Alcohol intoxication - ETOH level 253 on presentation. Has not required ativan. P:  -Continue CIWA protocol  -monitor for withdrawal  FEN: saline lock PPx: subq heparin  Disposition: Possible discharge home today, although WBC count mildly concerning  Subjective: Patient continues to feel good. No chest pain, shortness of breath. No fevers overnight.  Objective: Temp:  [98 F (36.7  C)-98.9 F (37.2 C)] 98.5 F (36.9 C) (04/27 0541) Pulse Rate:  [89-97] 97 (04/27 0541) Resp:  [18] 18 (04/27 0541) BP: (111-136)/(52-70) 111/59 mmHg (04/27 0541) SpO2:  [95 %-96 %] 96 % (04/27 0541)  Physical Exam: General: adult male, sitting at bedside, morbidly obese; in no acute distress Cardiovascular: Irregularly irregular rhythm, regular rate, no murmur appreciated Respiratory: CTAB, no wheezing Abdomen: Soft, non-tender, obese; right-lateral ~6cm skin growth, non-tender Extremities: unchanged chronic venous stasis changes in bilateral legs  Laboratory:  Recent Labs Lab 01/19/14 0557 01/21/14 0608 01/23/14 0445  WBC 15.1* 15.5* 16.7*  HGB 13.5 12.9* 12.9*  HCT 37.8* 37.1* 36.7*  PLT 464* 440* 422*    Recent Labs Lab 01/19/14 0201 01/19/14 0557 01/19/14 1705  01/21/14 0608 01/22/14 0430 01/23/14 0445  NA 140 142  --   < > 146 146 143  K 3.2* 3.3*  --   < > 3.8 4.5 4.3  CL 99 103  --   < > 111 109 106  CO2 23 23  --   < > 25 26 28   BUN 27* 27*  --   < > 17 14 12   CREATININE 2.54* 2.36*  --   < > 0.83 0.82 0.83  CALCIUM 7.6* 7.3*  --   < > 7.9* 8.0* 8.4  PROT 7.0 6.6 6.8  --   --  6.6  --   BILITOT 0.7 0.7  --   --   --  1.3*  --   ALKPHOS 168* 156*  --   --   --  148*  --   ALT 28 27  --   --   --  29  --   AST 57* 57*  --   --   --  72*  --   GLUCOSE 150* 128*  --   < > 110* 101* 109*  < > = values in this interval not displayed. Imaging/Diagnostic Tests: No results found. in last 24h  Matthew Poche, MD 01/23/2014, 9:08 AM PGY-1, [place] Intern pager: [phone], text pages welcome

## 2014-01-22 NOTE — Progress Notes (Signed)
CPAP is set up at the pt.'s bedside with a nasal mask. Pt. States that he will place himself on CPAP before going to bed. Pt. Was made aware to call RT if he needed assistance with CPAP.

## 2014-01-22 NOTE — Evaluation (Signed)
Physical Therapy Evaluation and Discharge  Patient Details Name: Matthew [name] MRN: [id number] DOB: 08/21/1956 Today's Date: 01/22/2014   History of Present Illness  Matthew [name] is a 58 y.o. male presenting with shortness of breath found to be in severe sepsis. PMH is significant for alcohol abuse, prior septic shock, DM, HTN, sleep apnea.  Clinical Impression  Pt adm due to the above. Pt at baseline for mobility at this time. Addressed stair mobiltiy with pt and pt demo good technique with supervision only for safety. No DME needs at this time. Encouraged pt to ambulate unit as tolerated with nursing. Will sign off at this time.     Follow Up Recommendations No PT follow up;Supervision - Intermittent    Equipment Recommendations  None recommended by PT    Recommendations for Other Services       Precautions / Restrictions Precautions Precautions: None Precaution Comments: reports he has fallen but never "sober"  Restrictions Weight Bearing Restrictions: No      Mobility  Bed Mobility               General bed mobility comments: not assessed; pt sitting in chair and returned to chair   Transfers Overall transfer level: Modified independent Equipment used: None             General transfer comment: pt relies on armrests; incr time due to pain and edema in LEs   Ambulation/Gait Ambulation/Gait assistance: Modified independent (Device/Increase time) Ambulation Distance (Feet): 200 Feet Assistive device: None Gait Pattern/deviations: Step-through pattern;Decreased stride length;Wide base of support (externally rotated LEs and waddle like gt ) Gait velocity: guarded; at baseline per pt Gait velocity interpretation: Below normal speed for age/gender General Gait Details: no LOB noted with gt; pt reports to be at baseline; pt slightly SOB secondary to PNA   Stairs Stairs: Yes Stairs assistance: Supervision Stair Management: Two rails;Step to  pattern;Forwards Number of Stairs: 3 General stair comments: supervision for safety; pt demo good technique and no LOB noted ; encouraged step to gt for safety   Wheelchair Mobility    Modified Rankin (Stroke Patients Only)       Balance Overall balance assessment: Modified Independent (reports falls were secondary to alcohol )                                           Pertinent Vitals/Pain C/o "little pain in LEs" did not rate; stated it was due to not being able to take his pills.     Home Living Family/patient expects to be discharged to:: Private residence Living Arrangements: Children Available Help at Discharge: Family;Available PRN/intermittently Type of Home: House Home Access: Stairs to enter Entrance Stairs-Rails: Left;Right;Can reach both Technical brewer of Steps: 3 Home Layout: One level Home Equipment: Clinical cytogeneticist - standard Additional Comments: standard shower and toilet seat    Prior Function Level of Independence: Independent               Hand Dominance   Dominant Hand: Right    Extremity/Trunk Assessment   Upper Extremity Assessment: Overall WFL for tasks assessed           Lower Extremity Assessment: Overall WFL for tasks assessed      Cervical / Trunk Assessment: Kyphotic  Communication   Communication: No difficulties  Cognition Arousal/Alertness: Awake/alert Behavior During Therapy: WFL for tasks assessed/performed Overall  Cognitive Status: Within Functional Limits for tasks assessed                      General Comments General comments (skin integrity, edema, etc.): bil LE Edema     Exercises General Exercises - Lower Extremity Ankle Circles/Pumps: AROM;15 reps;Both;Seated      Assessment/Plan    PT Assessment Patent does not need any further PT services  PT Diagnosis     PT Problem List    PT Treatment Interventions     PT Goals (Current goals can be found in the Care Plan  section) Acute Rehab PT Goals PT Goal Formulation: No goals set, d/c therapy    Frequency     Barriers to discharge        Co-evaluation               End of Session   Activity Tolerance: Patient tolerated treatment well Patient left: in chair;with call bell/phone within reach;with family/visitor present Nurse Communication: Mobility status         Time: 8563-1497 PT Time Calculation (min): 12 min   Charges:   PT Evaluation $Initial PT Evaluation Tier I: 1 Procedure PT Treatments $Gait Training: 8-22 mins   PT G CodesKennis Carina [place], [place] [phone] 01/22/2014, 4:08 PM

## 2014-01-22 NOTE — Progress Notes (Signed)
Attending Addendum  I examined the patient and discussed the assessment and plan with Dr. Venetia Maxon. I have reviewed the note and agree.    Boykin Nearing, [place]

## 2014-01-22 NOTE — Progress Notes (Signed)
Attending Addendum  I examined the patient on 01/21/14 and discussed the assessment and plan with Dr. Nettey. I have reviewed the note and agree.    Jaine Estabrooks, MD FAMILY MEDICINE TEACHING SERVICE    

## 2014-01-22 NOTE — Progress Notes (Signed)
Family Medicine Teaching Service Daily Progress Note Intern Pager: [phone]  Patient name: Matthew [name] Medical record number: [id number] Date of birth: 1956/09/25 Age: 58 y.o. Gender: male  Primary Care Provider: Gwendolyn Fill, MD Consultants: None Code Status: Full code  Pt Overview and Major Events to Date:  4/23: patient admitted; BP's improved with fluid, blood Cx drawn, started vanc / cefepime  Wound care consult - scrotom erythema, pannus skin maceration, likely intertrigo --> local dressing / care  US-guided thoracentesis with 930 mL clear yellow fluid from right pleural space 4/24: CT Chest - resolving pneumonia, ?loculation of parapneumonic effusion  Gram NEGATIVE rods on blood Cx; pleural tap looks transudative 4/25: generally improving, out of stepdown 4/26: Pan-sensitive E.coli on blood Cx; transition to PO Keflex   Assessment and Plan: Matthew [name] is a 58 y.o. male presenting with shortness of breath found to be in severe sepsis. PMH is significant for alcohol abuse, prior septic shock, DM, HTN, sleep apnea.   INFECTIOUS  A: Severe sepsis with evidence of end organ damage with elevated lactic acid, elevated Cr, likely resulting from HCAP (pleural effusion and infiltrate on CXR) vs cellulitis (with erythema on scrotum and noted firm panus). Blood cx - pansensitive E.coli. P:  -d/c IV abx, transition to Keflex PO (4/26 is day 4 of likely 10-14 day total course) -continue local care for intertrigo -consider outpt wound referral for scrotum  PULMONARY  A: Patient with likely HCAP leading to severe sepsis - sepsis now resolved. Hypoxemia and shortness of breath, now improved likely cause by potential HCAP. Tho915mL fluid retrieved from thoracentesis. CT chest showed resolving pneumonia. Also showed evidence of parapneumonic effusion with likely loculation.  P:  -abx as above -O2 via [place] for sats >92%, continue pulse ox monitoring -nighttime  CPAP  CARDIOVASCULAR  A: Patients initial MAP of 58 responded well to NS boluses. Last echo with EF 65-70%, elevated pro-BNP (likely related to renal dysfunction)  Hx of HTN Hx Afib, RVR resolved Chest pain - intermittent / resolved; atypical for cardiac cause, though notes this with exertion and dyspnea, likely related to underlying PNA P:  -continue home diltiazem 240mg  -will give an additional 1 L NS bolus  -discontinue cardiac monitoring -hold home antiHTN meds (Lotensin, Lasix); ?restart on discharge  RENAL  A: AKI Cr elevated to 2.54, now WNR, likely end organ damage from septic state.  Hypokalemia, s/p potassium, now resolved Corrected calcium of 9.2  P:  - trend chemistries - saline lock  GASTROINTESTINAL  A: Mild elevation of AST with normal ALT in ~2:1 ratio possibly related to alcohol use. No evidence of shock liver at this time. Note INR mildly elevated at 1.51 (autoanticoagulated likely to chronic liver disease) P:  -cardiac diet  HEME/ONC A: Leukocytosis likely stemming from an infectious process, though note this was not that much higher than his discharge WBC from February.  Thrombocytosis - likely an acute phase reactant  No therapeutic anticoagulation due to fall risk given alcoholism Evidence of chronic cirrhosis on CT. Multiple enlarged lymph nodes concerning for possible hepatic cancer. MRI not successful inpatient. May pursue outpatient workup P:  -antibiotics per above  -will trend WBC  -heparin SQ   ENDOCRINE  A: Hx of DM- last A1c 5.6 11/03/13  P:  -SSI; change CBG's to qAC+HS  NEUROLOGIC  A: Alcohol intoxication - ETOH level 253 on presentation. Has not required ativan. P:  -Continue CIWA protocol  -monitor for withdrawal  FEN: saline lock PPx: subq  heparin  Disposition: management as above; discharge likely tomorrow with PO abx  Subjective:  Patient feels well and in much better spirits, today. Denies chest pain, SOB. No fevers / chills,  N/V. He is eager to go home.  Objective: Temp:  [97.9 F (36.6 C)-98.4 F (36.9 C)] 98 F (36.7 C) (04/26 0552) Pulse Rate:  [88-98] 88 (04/26 0552) Resp:  [18-19] 18 (04/26 0552) BP: (105-123)/(59-66) 118/66 mmHg (04/26 0552) SpO2:  [94 %-99 %] 94 % (04/26 0552)  Physical Exam: General: adult male, sitting at bedside, morbidly obese; in no acute distress Cardiovascular: Irregularly irregular rhythm, regular rate, no murmur appreciated Respiratory: CTAB, no wheezing Abdomen: Soft, non-tender, obese; right-lateral ~6cm skin growth, non-tender Extremities: unchanged chronic venous stasis changes in bilateral legs  Laboratory:  Recent Labs Lab 01/19/14 0201 01/19/14 0557 01/21/14 0608  WBC 15.3* 15.1* 15.5*  HGB 13.8 13.5 12.9*  HCT 38.4* 37.8* 37.1*  PLT 473* 464* 440*    Recent Labs Lab 01/19/14 0201 01/19/14 0557 01/19/14 1705 01/20/14 1115 01/21/14 0608 01/22/14 0430  NA 140 142  --  144 146 146  K 3.2* 3.3*  --  3.5* 3.8 4.5  CL 99 103  --  107 111 109  CO2 23 23  --  25 25 26   BUN 27* 27*  --  22 17 14   CREATININE 2.54* 2.36*  --  1.16 0.83 0.82  CALCIUM 7.6* 7.3*  --  7.7* 7.9* 8.0*  PROT 7.0 6.6 6.8  --   --  6.6  BILITOT 0.7 0.7  --   --   --  1.3*  ALKPHOS 168* 156*  --   --   --  148*  ALT 28 27  --   --   --  29  AST 57* 57*  --   --   --  72*  GLUCOSE 150* 128*  --  141* 110* 101*   Imaging/Diagnostic Tests: No results found. in last [place], MD 01/22/2014, 9:58 AM PGY-2, [place] Intern pager: [phone], text pages welcome

## 2014-01-23 LAB — BASIC METABOLIC PANEL
BUN: 12 mg/dL (ref 6–23)
CO2: 28 mEq/L (ref 19–32)
Calcium: 8.4 mg/dL (ref 8.4–10.5)
Chloride: 106 mEq/L (ref 96–112)
Creatinine, Ser: 0.83 mg/dL (ref 0.50–1.35)
GFR calc Af Amer: >90 mL/min (ref >90)
Glucose, Bld: 109 mg/dL — ABNORMAL HIGH (ref 70–99)
POTASSIUM: 4.3 meq/L (ref 3.7–5.3)
SODIUM: 143 meq/L (ref 137–147)

## 2014-01-23 LAB — GLUCOSE, CAPILLARY
GLUCOSE-CAPILLARY: 103 mg/dL — AB (ref 70–99)
Glucose-Capillary: 104 mg/dL — ABNORMAL HIGH (ref 70–99)
Glucose-Capillary: 123 mg/dL — ABNORMAL HIGH (ref 70–99)

## 2014-01-23 LAB — CBC
HEMATOCRIT: 36.7 % — AB (ref 39.0–52.0)
Hemoglobin: 12.9 g/dL — ABNORMAL LOW (ref 13.0–17.0)
MCH: 34.1 pg — AB (ref 26.0–34.0)
MCHC: 35.1 g/dL (ref 30.0–36.0)
MCV: 97.1 fL (ref 78.0–100.0)
Platelets: 422 10*3/uL — ABNORMAL HIGH (ref 150–400)
RBC: 3.78 MIL/uL — ABNORMAL LOW (ref 4.22–5.81)
RDW: 16.3 % — ABNORMAL HIGH (ref 11.5–15.5)
WBC: 16.7 10*3/uL — ABNORMAL HIGH (ref 4.0–10.5)

## 2014-01-23 MED ORDER — MUPIROCIN CALCIUM 2 % EX CREA: TOPICAL_CREAM | Freq: Every day | CUTANEOUS | Status: DC

## 2014-01-23 MED ORDER — CEPHALEXIN 500 MG PO CAPS: 500.0000 mg | ORAL_CAPSULE | Freq: Four times a day (QID) | ORAL | Status: AC

## 2014-01-23 NOTE — Care Management (Signed)
1443 01-23-14 CM did call the wound care center and f/u appointment set for 02-07-14. CM did text MD to make aware. No further needs from CM at this time. Ocie Cornfield Temple, RN,BSN [phone]

## 2014-01-23 NOTE — Progress Notes (Signed)
FMTS ATTENDING  NOTE Latrease Kunde,MD I  have seen and examined this patient, reviewed their chart. I have discussed this patient with the resident. I agree with the resident's findings, assessment and care plan. 

## 2014-01-23 NOTE — Consult Note (Addendum)
WOC wound re-consult note Reason for Consult: Refer to previous progress note from 4/23. Pt states that Interdry fabric has decreased discomfort to abd /groin fold areas.  This should remain in place until 4/28 for optimal plan of care.  Re-consult  To [facility] team requested for legs Wound type: He states he fell prior to admission and has a few scattered scabbed areas.  Bactroban ordered during previous consult;  pt is allergic to an ingredient in Neosporin. Wound bed: New area of  full thickness stasis ulcer to right calf; .2X.2X.2cm, 100% yellow and moist. Drainage (amount, consistency, odor) Mod amt yellow drainage, no odor. Periwound:  Pt has generalized erythremia and edema to BLE  Dressing procedure/placement/frequency: Continue present plan of care with Bactroban to promote healing and foam dressing to absorb drainage to right calf.  Please re-consult if further assistance is needed.  Thank-you,  Julien Girt MSN, [place], [place], [place], [place]

## 2014-01-23 NOTE — Discharge Instructions (Signed)
We admitted you because you were severely hypotensive (low blood pressure) when you go to the hospital with evidence of damage to your organs (mainly your kidneys). We treated you for pneumonia and found you have bacteria in your blood as well. We are continuing to treat you with antibiotics for 14 days. Please immediately return to the hospital if you develop fevers, abdominal pain, nausea/vomiting. We also want you to get worked up for suspicious findings on your abdominal imaging. This will be handled outside the hospital by your primary care physician and will include an abdominal MRI. It is important that you stop drinking since your liver is currently very damaged. It is also important for you to lose weight, however, this should be done in a healthy manner and not attempted while you are still being treated for this infection.

## 2014-01-24 ENCOUNTER — Inpatient Hospital Stay: Payer: Managed Care, Other (non HMO) | Admitting: Family Medicine

## 2014-01-25 ENCOUNTER — Other Ambulatory Visit: Payer: Self-pay | Admitting: Sports Medicine

## 2014-01-25 ENCOUNTER — Ambulatory Visit (INDEPENDENT_AMBULATORY_CARE_PROVIDER_SITE_OTHER): Payer: Managed Care, Other (non HMO) | Admitting: Sports Medicine

## 2014-01-25 ENCOUNTER — Encounter: Payer: Self-pay | Admitting: Sports Medicine

## 2014-01-25 VITALS — BP 131/72 | HR 102 | Temp 99.4°F | Ht 70.0 in | Wt >= 6400 oz

## 2014-01-25 DIAGNOSIS — Z658 Other specified problems related to psychosocial circumstances: Secondary | ICD-10-CM

## 2014-01-25 DIAGNOSIS — I10 Essential (primary) hypertension: Secondary | ICD-10-CM

## 2014-01-25 DIAGNOSIS — Z659 Problem related to unspecified psychosocial circumstances: Secondary | ICD-10-CM

## 2014-01-25 DIAGNOSIS — A419 Sepsis, unspecified organism: Secondary | ICD-10-CM

## 2014-01-25 DIAGNOSIS — R590 Localized enlarged lymph nodes: Secondary | ICD-10-CM

## 2014-01-25 DIAGNOSIS — R599 Enlarged lymph nodes, unspecified: Secondary | ICD-10-CM

## 2014-01-25 LAB — CBC
HCT: 37.1 % — ABNORMAL LOW (ref 39.0–52.0)
Hemoglobin: 13 g/dL (ref 13.0–17.0)
MCH: 33.2 pg (ref 26.0–34.0)
MCHC: 35 g/dL (ref 30.0–36.0)
MCV: 94.9 fL (ref 78.0–100.0)
PLATELETS: 502 10*3/uL — AB (ref 150–400)
RBC: 3.91 MIL/uL — AB (ref 4.22–5.81)
RDW: 15.5 % (ref 11.5–15.5)
WBC: 14.1 10*3/uL — AB (ref 4.0–10.5)

## 2014-01-25 LAB — CULTURE, BLOOD (ROUTINE X 2): Culture: NO GROWTH

## 2014-01-25 LAB — COMPREHENSIVE METABOLIC PANEL
ALBUMIN: 2.4 g/dL — AB (ref 3.5–5.2)
ALT: 31 U/L (ref 0–53)
AST: 63 U/L — ABNORMAL HIGH (ref 0–37)
Alkaline Phosphatase: 129 U/L — ABNORMAL HIGH (ref 39–117)
BUN: 9 mg/dL (ref 6–23)
CALCIUM: 8.8 mg/dL (ref 8.4–10.5)
CHLORIDE: 103 meq/L (ref 96–112)
CO2: 31 meq/L (ref 19–32)
Creat: 0.75 mg/dL (ref 0.50–1.35)
Glucose, Bld: 119 mg/dL — ABNORMAL HIGH (ref 70–99)
Potassium: 4.1 mEq/L (ref 3.5–5.3)
SODIUM: 142 meq/L (ref 135–145)
TOTAL PROTEIN: 6.5 g/dL (ref 6.0–8.3)
Total Bilirubin: 1.9 mg/dL — ABNORMAL HIGH (ref 0.2–1.2)

## 2014-01-25 LAB — TSH: TSH: 0.978 u[IU]/mL (ref 0.350–4.500)

## 2014-01-25 NOTE — Patient Instructions (Signed)
It was nice me today. Keep your followup appointments with the wound Center. We're checking labs today. Finish her antibiotics. Followup with Dr. Thomes Dinning or Dr. Teryl Lucy next week to further discuss the MRI.

## 2014-01-25 NOTE — Progress Notes (Signed)
Matthew [name] - 58 y.o. male MRN [id number]  Date of birth: 10-Apr-1956  CC & SUBJECTIVE:    Hospitalization Follow-up and Wound Check  HPI Comments: Patient presents with:   Hospitalization Follow-up   Wound Check  The patient presents following hospitalization for Escherichia coli bacteremia.  Reports overall he has been feeling better denies any fevers, chills, nausea, vomiting.  His respiratory status is significantly improved.  He does report significant mood disturbances secondary to losing his job and overall poor social situation.  His very large pannus that was likely the source for his infection remains to be tender but there are no open wounds.  He is using silver dressings.    He has been compliant with his medicines including his antibiotics.  Patient was found to have retro-peritoneal lymphadenopathy on CT scan but this was unable to be followed up do to his body habitus and outpatient MRI was recommended.  Patient would not like to pursue this at this time and would prefer to wait until his followup appointment with his PCP.  See problem based charting for additional problem specific subjective (including HPI, Interval History & ROS)   HISTORY:  reports that he quit smoking about 39 years ago. His smoking use included Cigarettes. He smoked 0.00 packs per day. He does not have any smokeless tobacco history on file. He reports that he drinks alcohol. His drug history is not on file. Otherwise past Medical, Surgical, Social, and Family History Reviewed per EMR Medications and Allergies reviewed and updated per below.  VITALS:  BP: 131/72 mmHg  HR: 102 bpm  TEMP: 99.4 F (37.4 C) (Oral)  RESP:    HT: 5\' 10"  (177.8 cm)  WT: 402 lb (182.346 kg)  BMI: 57.8   BP Readings from Last 3 Encounters:  01/25/14 131/72  01/23/14 118/61  12/26/13 137/56   Wt Readings from Last 3 Encounters:  01/25/14 402 lb (182.346 kg)  01/21/14 405 lb 8 oz (183.934 kg)  11/09/13 384 lb  (174.181 kg)     PHYSICAL EXAM:  Physical Exam  Constitutional: He appears well-developed and well-nourished.  Neck: Normal range of motion. No JVD (None appreciated) present.  Broad-based neck  Cardiovascular: Normal rate, regular rhythm and normal heart sounds.  Exam reveals no friction rub.   No murmur heard. Pulmonary/Chest: Effort normal. No respiratory distress. He has no wheezes. He has no rales. He exhibits no tenderness.  Distant breath sounds  Abdominal: Soft. Normal appearance and bowel sounds are normal. He exhibits distension (Morbidly obese with a very large pannus.). There is tenderness (Significant superficial intertrigo tenderness and beefy-red skin changes without ulceration or induration.). There is no rigidity, no rebound, no guarding and no CVA tenderness. No hernia.  Genitourinary:  Penis is retracted within the pannus.  There is no discharge  Musculoskeletal: Normal range of motion. He exhibits edema (2+/4 pitting edema).  Lymphadenopathy:    He has no cervical adenopathy.    MEDICATIONS, LABS & OTHER ORDERS: Previous Medications   CEPHALEXIN (KEFLEX) 500 MG CAPSULE    Take 1 capsule (500 mg total) by mouth 4 (four) times daily.   DILTIAZEM (TIAZAC) 240 MG 24 HR CAPSULE    Take 240 mg by mouth daily.   FOLIC ACID (FOLVITE) 1 MG TABLET    Take 1 tablet (1 mg total) by mouth daily.   MULTIPLE VITAMIN (MULTIVITAMIN WITH MINERALS) TABS TABLET    Take 1 tablet by mouth daily.   MUPIROCIN CREAM (BACTROBAN) 2 %  Apply topically daily.   THIAMINE 100 MG TABLET    Take 1 tablet (100 mg total) by mouth daily.   Modified Medications   No medications on file   New Prescriptions   No medications on file   Discontinued Medications   No medications on file   Orders Placed This Encounter  Procedures  . Comprehensive metabolic panel  . CBC  . TSH   ASSESSMENT & PLAN: See problem based charting & AVS for pt instructions.

## 2014-01-25 NOTE — Discharge Summary (Signed)
FMTS ATTENDING  NOTE Excell Neyland,MD I  have seen and examined this patient, reviewed their chart. I have discussed this patient with the resident. I agree with the resident's findings, assessment and care plan. 

## 2014-01-30 ENCOUNTER — Telehealth: Payer: Self-pay | Admitting: *Deleted

## 2014-01-30 DIAGNOSIS — R590 Localized enlarged lymph nodes: Secondary | ICD-10-CM | POA: Insufficient documentation

## 2014-01-30 MED ORDER — FUROSEMIDE 20 MG PO TABS: 20.0000 mg | ORAL_TABLET | Freq: Every day | ORAL | Status: DC

## 2014-01-30 MED ORDER — BENAZEPRIL HCL 10 MG PO TABS: 10.0000 mg | ORAL_TABLET | Freq: Every day | ORAL | Status: DC

## 2014-01-30 NOTE — Assessment & Plan Note (Signed)
-   Patient had Escherichia coli bacteremia x1 with unknown source but likely from peritoneal skin breakdown 1. Patient instructed to finish Keflex. > Continue to monitor for worsening fevers.

## 2014-01-30 NOTE — Telephone Encounter (Signed)
Message copied by Corinna Capra on Mon Jan 30, 2014 11:16 AM ------      Message from: Teresa Coombs D      Created: Mon Jan 30, 2014 10:35 AM       Will you please call pt and inform him his kidney function is back to normal and that he should restart his Benazapril at 10mg  (1/2 of his previous dose) and the lasix 20mg .  thanks ------

## 2014-01-30 NOTE — Telephone Encounter (Signed)
Left message on patient's voicemail.Matthew [name] S Matthew [name]  

## 2014-01-30 NOTE — Assessment & Plan Note (Signed)
Chronic condition - ACE inhibitor and HCTZ stopped. 1. Recheck labs, if these are normal okay to restart blood pressure medicines > Followup labs  .

## 2014-01-30 NOTE — Progress Notes (Signed)
Creatinine significantly improved.  We'll plan to restart his home blood pressure medicines.  Nursing to call him and inform him of results and plan to restart Furosemide & Benazepril at reduced dose.

## 2014-01-30 NOTE — Assessment & Plan Note (Signed)
MRI has been recommended however the patient would like to wait on this at this time.  This is obviously slightly concerning but may be reflective of an inflammatory process from the cellulitis on his pannus.  Discussed we are unable to verify if this is reactive versus potential malignancy and he is understanding and reports he will likely wish to proceed after discussing further at his next appointment.

## 2014-01-30 NOTE — Addendum Note (Signed)
Addended by: Teresa Coombs D on: 01/30/2014 10:35 AM   Modules accepted: Orders, Medications

## 2014-01-30 NOTE — Assessment & Plan Note (Signed)
Patient has lost his job and insurance at this time.  He is quite distraught over this period Encourage close followup with PCP.  Patient is aware he will be changing PCPs to graduation and he is interested in establishing with Dr. Teryl Lucy who he met during his hospitalization.  Encouraged him to discuss this with office if this something he is interested in

## 2014-02-07 ENCOUNTER — Encounter (HOSPITAL_BASED_OUTPATIENT_CLINIC_OR_DEPARTMENT_OTHER): Payer: Managed Care, Other (non HMO) | Attending: General Surgery

## 2014-02-10 ENCOUNTER — Ambulatory Visit: Payer: Managed Care, Other (non HMO) | Admitting: Sports Medicine

## 2014-02-14 ENCOUNTER — Other Ambulatory Visit: Payer: Self-pay | Admitting: Family Medicine

## 2014-02-17 ENCOUNTER — Ambulatory Visit: Payer: Managed Care, Other (non HMO) | Admitting: Sports Medicine

## 2014-02-22 ENCOUNTER — Ambulatory Visit (INDEPENDENT_AMBULATORY_CARE_PROVIDER_SITE_OTHER): Payer: Managed Care, Other (non HMO) | Admitting: Family Medicine

## 2014-02-22 VITALS — BP 127/79 | HR 95 | Temp 99.5°F

## 2014-02-22 DIAGNOSIS — E119 Type 2 diabetes mellitus without complications: Secondary | ICD-10-CM

## 2014-02-22 DIAGNOSIS — Z659 Problem related to unspecified psychosocial circumstances: Secondary | ICD-10-CM

## 2014-02-22 DIAGNOSIS — E65 Localized adiposity: Secondary | ICD-10-CM

## 2014-02-22 DIAGNOSIS — Z658 Other specified problems related to psychosocial circumstances: Secondary | ICD-10-CM

## 2014-02-22 NOTE — Patient Instructions (Signed)
It has been a pleasure to see you today. Please schedule an appointment to meet with Eusebio Friendly and determine if you qualify for the program "plan access"  You may need a referral to nutrition and general surgery and this can be referred after the prior situation is clarified. Please call Constance Holster back and update her with your recent needs.

## 2014-02-23 DIAGNOSIS — E65 Localized adiposity: Secondary | ICD-10-CM | POA: Insufficient documentation

## 2014-02-23 MED ORDER — CLOTRIMAZOLE 1 % EX CREA: 1.0000 "application " | TOPICAL_CREAM | Freq: Two times a day (BID) | CUTANEOUS | Status: DC

## 2014-02-23 NOTE — Assessment & Plan Note (Signed)
No on medication therapy. Last A1C 10/2013 was 5.6. Reports  Not doing proper diet. Refuses A1C today. Instructed about diet and exercise regimen. F/u in 3 months.

## 2014-02-23 NOTE — Progress Notes (Signed)
Family Medicine Office Visit Note   Subjective:   Patient ID: Matthew [name], male  DOB: 1955/11/03, 58 y.o.. MRN: [id number]   Pt that comes today for follow up. He reports his financial situation has gone to the limit where he may lose his house. He has not been able to find a job and besides receiving package from our Education officer, museum with resources he found no help for his situation. He denies recent alcohol intake and wishes to lose weight.  Review of Systems:  Pt denies SOB, chest pain, palpitations, headaches, dizziness, numbness or weakness. No changes on urinary or BM habits. No unintentional weigh loss/gain.  Objective:   Physical Exam: Gen:  Morbidly obese, NAD.  HEENT: Moist mucous membranes  CV: Regular rate and rhythm, no murmurs rubs or gallops PULM: Clear to auscultation bilaterally. No wheezes/rales/rhonchi ABD: obese, normal BS, non tender. Severe pannus with pel du orange skin changes on its lower aspect. Umbilical hernia of 5 cm that can be reduced. No increase erythema. Still skin lesions consistent with intertrigo are present.  EXT: venous stasis changes on both LE. No current erythema or signs of localized infection.  Neuro: Alert and oriented x3. No focalization  Assessment & Plan:

## 2014-02-23 NOTE — Assessment & Plan Note (Signed)
Continues to struggle with his social/financial situation.  Will route this note to our SW to see if there any other resources available for him.

## 2014-02-23 NOTE — Progress Notes (Signed)
CSW provided pt with several resources in March 2015.  Pt was encouraged to apply for jobs/seek financial assistance as pt's income was dwindling. Unfortunately there are no additional resources available. Pt is welcome to contact CSW however CSW will inform him of the above.  Hunt Oris, MSW, [place]

## 2014-02-23 NOTE — Assessment & Plan Note (Signed)
Severe, no signs of bacterial infection at this time but intertrigo is present. Concerns with proper hygiene since pt barely reaches the bottom of pannus.  Also umbilical hernia is present. Recommend and encourage weight loss.  Pt will meet today to discuss eligibility to project access in order to proceed with referrals of nutrition and general surgery. He declines f/u labs today (A1C ) reporting he has not been doing DM diet and refuses DM f/u and evaluation for this today.

## 2014-03-03 ENCOUNTER — Telehealth: Payer: Self-pay | Admitting: Family Medicine

## 2014-03-03 NOTE — Telephone Encounter (Signed)
Pt does not have heart failure and he is cured from his episode of sepsis. I don't see any medical limitation decompensated to the point of limiting his working capability.

## 2014-03-03 NOTE — Telephone Encounter (Signed)
Pt says he has difficulty walking from point A-B.  Have breathing problems upon any physical exertion.  Patient says he is trying to get a payment plan set up for his house or he will lose it, therefore this is why he needed something from provider.  Want to have you call him back to discuss this issue.

## 2014-03-03 NOTE — Telephone Encounter (Signed)
Pt called and would like the doctor to write a letter stating that he is unable to work due to his congested heart failure and sepsis. Please call when ready. j w

## 2014-03-03 NOTE — Telephone Encounter (Signed)
LVM for patient to call back. ?

## 2014-03-06 NOTE — Telephone Encounter (Signed)
Called Mr. Biederman and left voicemail. Since this a new symptom, he needs evaluation. I recommended pt to make appointment to discuss further.

## 2014-03-18 ENCOUNTER — Emergency Department (HOSPITAL_COMMUNITY)
Admission: EM | Admit: 2014-03-18 | Discharge: 2014-03-19 | Disposition: A | Discharge status: Home / Self Care | Payer: Managed Care, Other (non HMO) | Attending: Emergency Medicine | Admitting: Emergency Medicine

## 2014-03-18 ENCOUNTER — Emergency Department (HOSPITAL_COMMUNITY): Payer: Managed Care, Other (non HMO)

## 2014-03-18 ENCOUNTER — Encounter (HOSPITAL_COMMUNITY): Payer: Self-pay | Admitting: Emergency Medicine

## 2014-03-18 DIAGNOSIS — R531 Weakness: Secondary | ICD-10-CM

## 2014-03-18 DIAGNOSIS — R6883 Chills (without fever): Secondary | ICD-10-CM | POA: Insufficient documentation

## 2014-03-18 DIAGNOSIS — I10 Essential (primary) hypertension: Secondary | ICD-10-CM | POA: Insufficient documentation

## 2014-03-18 DIAGNOSIS — IMO0002 Reserved for concepts with insufficient information to code with codable children: Secondary | ICD-10-CM | POA: Insufficient documentation

## 2014-03-18 DIAGNOSIS — N390 Urinary tract infection, site not specified: Secondary | ICD-10-CM

## 2014-03-18 DIAGNOSIS — Z87891 Personal history of nicotine dependence: Secondary | ICD-10-CM | POA: Insufficient documentation

## 2014-03-18 DIAGNOSIS — R5381 Other malaise: Secondary | ICD-10-CM | POA: Insufficient documentation

## 2014-03-18 DIAGNOSIS — Z8719 Personal history of other diseases of the digestive system: Secondary | ICD-10-CM | POA: Insufficient documentation

## 2014-03-18 DIAGNOSIS — Z79899 Other long term (current) drug therapy: Secondary | ICD-10-CM | POA: Insufficient documentation

## 2014-03-18 DIAGNOSIS — I4891 Unspecified atrial fibrillation: Secondary | ICD-10-CM | POA: Insufficient documentation

## 2014-03-18 DIAGNOSIS — R5383 Other fatigue: Principal | ICD-10-CM

## 2014-03-18 DIAGNOSIS — J90 Pleural effusion, not elsewhere classified: Secondary | ICD-10-CM | POA: Insufficient documentation

## 2014-03-18 DIAGNOSIS — Z8619 Personal history of other infectious and parasitic diseases: Secondary | ICD-10-CM | POA: Insufficient documentation

## 2014-03-18 LAB — CBC WITH DIFFERENTIAL/PLATELET
Basophils Absolute: 0.1 10*3/uL (ref 0.0–0.1)
Basophils Relative: 1 % (ref 0–1)
EOS ABS: 0.2 10*3/uL (ref 0.0–0.7)
EOS PCT: 2 % (ref 0–5)
HEMATOCRIT: 37.9 % — AB (ref 39.0–52.0)
HEMOGLOBIN: 13.5 g/dL (ref 13.0–17.0)
LYMPHS PCT: 33 % (ref 12–46)
Lymphs Abs: 3.1 10*3/uL (ref 0.7–4.0)
MCH: 33.3 pg (ref 26.0–34.0)
MCHC: 35.6 g/dL (ref 30.0–36.0)
MCV: 93.6 fL (ref 78.0–100.0)
MONO ABS: 1.5 10*3/uL — AB (ref 0.1–1.0)
MONOS PCT: 15 % — AB (ref 3–12)
Neutro Abs: 4.7 10*3/uL (ref 1.7–7.7)
Neutrophils Relative %: 49 % (ref 43–77)
Platelets: 328 10*3/uL (ref 150–400)
RBC: 4.05 MIL/uL — AB (ref 4.22–5.81)
RDW: 15.3 % (ref 11.5–15.5)
WBC: 9.5 10*3/uL (ref 4.0–10.5)

## 2014-03-18 LAB — I-STAT TROPONIN, ED: TROPONIN I, POC: 0.01 ng/mL (ref 0.00–0.08)

## 2014-03-18 LAB — CBG MONITORING, ED: GLUCOSE-CAPILLARY: 132 mg/dL — AB (ref 70–99)

## 2014-03-18 LAB — I-STAT CG4 LACTIC ACID, ED: Lactic Acid, Venous: 3.11 mmol/L — ABNORMAL HIGH (ref 0.5–2.2)

## 2014-03-18 NOTE — ED Notes (Signed)
Called lab to add on BNP. ?

## 2014-03-18 NOTE — ED Provider Notes (Signed)
CSN: [id number]     Arrival date & time 03/18/14  2300 History   First MD Initiated Contact with Patient 03/18/14 2305     Chief Complaint  Patient presents with  . Weakness     (Consider location/radiation/quality/duration/timing/severity/associated sxs/prior Treatment) HPI Patient presents with generalized fatigue starting around noon today. He denies any pain. He has mild shortness of breath with exertion but specifically states he has no chest pain, abdominal pain, cough, fever. He's recently been hospitalized for severe sepsis due to panniculitis. 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 Denies any new urinary symptoms. Past Medical History  Diagnosis Date  . Atrial fibrillation, chronic   . Hypertension   . GERD (gastroesophageal reflux disease)   . Septic shock    Past Surgical History  Procedure Laterality Date  . Splenectomy    . Tonsilectomy, adenoidectomy, bilateral myringotomy and tubes    . Knee arthroscopy    . Hernia repair     No family history on file. History  Substance Use Topics  . Smoking status: Former Smoker    Types: Cigarettes    Quit date: 11/01/1974  . Smokeless tobacco: Not on file     Comment: smoked in high school for about a year  . Alcohol Use: Yes    Review of Systems  Constitutional: Positive for chills and fatigue. Negative for fever and diaphoresis.  Respiratory: Positive for shortness of breath. Negative for cough and wheezing.   Gastrointestinal: Negative for nausea, vomiting, abdominal pain, diarrhea and constipation.  Genitourinary: Negative for dysuria, frequency and flank pain.  Musculoskeletal: Negative for back pain, neck pain and neck stiffness.  Skin: Negative for rash and wound.  Neurological: Positive for weakness (generalized). Negative for dizziness, syncope, numbness and headaches.  All other systems reviewed and are negative.     Allergies  Augmentin; Potassium-containing  compounds; and Neomycin  Home Medications   Prior to Admission medications   Medication Sig Start Date End Date Taking? Authorizing Provider  benazepril (LOTENSIN) 10 MG tablet Take 1 tablet (10 mg total) by mouth daily. 01/30/14  Yes Gerda Diss, DO  diltiazem (TIAZAC) 240 MG 24 hr capsule Take 240 mg by mouth daily.   Yes Historical Provider, MD  folic acid (FOLVITE) 1 MG tablet Take 1 mg by mouth daily.   Yes Historical Provider, MD  furosemide (LASIX) 20 MG tablet Take 1 tablet (20 mg total) by mouth daily. 01/30/14  Yes Gerda Diss, DO  Multiple Vitamin (MULTIVITAMIN WITH MINERALS) TABS tablet Take 1 tablet by mouth daily. 11/04/13  Yes Coral Spikes, DO  mupirocin cream (BACTROBAN) 2 % Apply topically daily. 01/23/14  Yes Cordelia Poche, MD  thiamine 100 MG tablet Take 1 tablet (100 mg total) by mouth daily. 11/04/13  Yes Jayce G Cook, DO   BP 127/87  Pulse 87  Temp(Src) 98.5 F (36.9 C) (Oral)  Resp 16  SpO2 95% Physical Exam  Nursing note and vitals reviewed. Constitutional: He is oriented to person, place, and time. He appears well-developed and well-nourished. No distress.  Morbidly obese  HENT:  Head: Normocephalic and atraumatic.  Mouth/Throat: Oropharynx is clear and moist. No oropharyngeal exudate.  Eyes: EOM are normal. Pupils are equal, round, and reactive to light.  Neck: Normal range of motion. Neck supple.  No meningismus  Cardiovascular:  Irregularly irregular  Pulmonary/Chest: Effort normal and breath sounds normal. No respiratory distress. He has no wheezes. He has no rales.  Decreased breath sounds right base.  Abdominal: Soft. Bowel sounds are normal. He exhibits no distension and no mass. There is no tenderness. There is no rebound and no guarding.  Reducible umbilical hernia. Large pannus. Chronic inflammatory changes and induration. Question right side with increased warmth.  Musculoskeletal: Normal range of motion. He exhibits edema. He exhibits no  tenderness.  Bilateral lower extremity swelling. No calf tenderness.  Neurological: He is alert and oriented to person, place, and time.  5/5 motor in all extremities. Sensation is grossly intact. Ambulatory  Skin: Skin is warm and dry. No rash noted. No erythema.  Psychiatric: He has a normal mood and affect. His behavior is normal.    ED Course  Procedures (including critical care time) Labs Review Labs Reviewed  CBC WITH DIFFERENTIAL - Abnormal; Notable for the following:    RBC 4.05 (*)    HCT 37.9 (*)    Monocytes Relative 15 (*)    Monocytes Absolute 1.5 (*)    All other components within normal limits  COMPREHENSIVE METABOLIC PANEL - Abnormal; Notable for the following:    Glucose, Bld 121 (*)    Albumin 2.8 (*)    AST 52 (*)    Alkaline Phosphatase 118 (*)    All other components within normal limits  URINALYSIS, ROUTINE W REFLEX MICROSCOPIC - Abnormal; Notable for the following:    Hgb urine dipstick MODERATE (*)    Nitrite POSITIVE (*)    Leukocytes, UA MODERATE (*)    All other components within normal limits  PRO B NATRIURETIC PEPTIDE - Abnormal; Notable for the following:    Pro B Natriuretic peptide (BNP) 455.3 (*)    All other components within normal limits  URINE MICROSCOPIC-ADD ON - Abnormal; Notable for the following:    Bacteria, UA MANY (*)    All other components within normal limits  CBG MONITORING, ED - Abnormal; Notable for the following:    Glucose-Capillary 132 (*)    All other components within normal limits  I-STAT CG4 LACTIC ACID, ED - Abnormal; Notable for the following:    Lactic Acid, Venous 3.11 (*)    All other components within normal limits  I-STAT TROPOININ, ED    Imaging Review Dg Chest 2 View  03/19/2014   CLINICAL DATA:  Weakness  EXAM: CHEST  2 VIEW  COMPARISON:  Radiograph 01/20/2014  FINDINGS: Normal cardiac silhouette. There is a chronic right effusion which is increased from prior. Bibasilar atelectasis is similar.   IMPRESSION: 1. Increasing chronic right pleural effusion. This may relate to cirrhotic liver. Cannot exclude malignancy with unilateral effusion. Consider cytology evaluation. 2. Bibasilar atelectasis.   Electronically Signed   By: Suzy Bouchard M.D.   On: 03/19/2014 00:08     EKG Interpretation None      Date: 03/19/2014  Rate: 96  Rhythm: normal sinus rhythm  QRS Axis: normal  Intervals: normal  ST/T Wave abnormalities: normal  Conduction Disutrbances:none  Narrative Interpretation:   Old EKG Reviewed: unchanged   MDM   Final diagnoses:  None   Patient states she is feeling much better. Vital signs remained stable in the emergency department. He is satting 97% on room air. Patient with urinary tract infection. Mildly elevated lactic acid of unknown significance given his stable vital signs and normal white blood cell count. He was given 1 g of Rocephin in the emergency department. We'll discharge home with prescription for Keflex. He is advised he needs to followup with his primary Dr.  He's been given strict return precautions and is voiced understanding.   Discussed with family medicine resident and will make a note to have patient seen in clinic early next week. Agrees with plan to discharge home.  Julianne Rice, MD 03/19/14 [phone]

## 2014-03-18 NOTE — ED Notes (Signed)
Patient complaining of generalized weakness starting mid day today. Hx of sepsis d/t groin wound infections. Patient is SOB w/ exertion. EMS state patient room air sat was 90%, on 4L via [place] patient sat is 99%. Denies any pain. CBG 118. Hx of a-fib and CHF. Patient has bilateral peripheral swelling, but states that is normal for him. Denies running fevers.

## 2014-03-19 LAB — COMPREHENSIVE METABOLIC PANEL
ALT: 24 U/L (ref 0–53)
AST: 52 U/L — ABNORMAL HIGH (ref 0–37)
Albumin: 2.8 g/dL — ABNORMAL LOW (ref 3.5–5.2)
Alkaline Phosphatase: 118 U/L — ABNORMAL HIGH (ref 39–117)
BUN: 6 mg/dL (ref 6–23)
CALCIUM: 8.7 mg/dL (ref 8.4–10.5)
CO2: 26 mEq/L (ref 19–32)
Chloride: 102 mEq/L (ref 96–112)
Creatinine, Ser: 0.64 mg/dL (ref 0.50–1.35)
GFR calc non Af Amer: >90 mL/min (ref >90)
Glucose, Bld: 121 mg/dL — ABNORMAL HIGH (ref 70–99)
Potassium: 3.7 mEq/L (ref 3.7–5.3)
SODIUM: 142 meq/L (ref 137–147)
TOTAL PROTEIN: 8.1 g/dL (ref 6.0–8.3)
Total Bilirubin: 1.2 mg/dL (ref 0.3–1.2)

## 2014-03-19 LAB — PRO B NATRIURETIC PEPTIDE: Pro B Natriuretic peptide (BNP): 455.3 pg/mL — ABNORMAL HIGH (ref 0–125)

## 2014-03-19 LAB — URINALYSIS, ROUTINE W REFLEX MICROSCOPIC
BILIRUBIN URINE: NEGATIVE
Glucose, UA: NEGATIVE mg/dL
KETONES UR: NEGATIVE mg/dL
NITRITE: POSITIVE — AB
PH: 6 (ref 5.0–8.0)
Protein, ur: NEGATIVE mg/dL
SPECIFIC GRAVITY, URINE: 1.015 (ref 1.005–1.030)
UROBILINOGEN UA: 1 mg/dL (ref 0.0–1.0)

## 2014-03-19 LAB — URINE MICROSCOPIC-ADD ON

## 2014-03-19 MED ORDER — CEPHALEXIN 500 MG PO CAPS: 500.0000 mg | ORAL_CAPSULE | Freq: Four times a day (QID) | ORAL | Status: DC

## 2014-03-19 MED ORDER — DEXTROSE 5 % IV SOLN
1.0000 g | Freq: Once | INTRAVENOUS | Status: AC
  Administered 2014-03-19: 1 g via INTRAVENOUS
  Filled 2014-03-19: qty 10

## 2014-03-19 MED ORDER — SODIUM CHLORIDE 0.9 % IV SOLN
Freq: Once | INTRAVENOUS | Status: AC
  Administered 2014-03-19: 02:00:00 via INTRAVENOUS

## 2014-03-19 NOTE — ED Notes (Signed)
Paged IV team 

## 2014-03-19 NOTE — Discharge Instructions (Signed)

## 2014-03-22 ENCOUNTER — Ambulatory Visit: Payer: Managed Care, Other (non HMO) | Admitting: Family Medicine

## 2014-03-27 ENCOUNTER — Telehealth: Payer: Self-pay | Admitting: Family Medicine

## 2014-03-27 ENCOUNTER — Ambulatory Visit: Payer: Managed Care, Other (non HMO) | Admitting: Family Medicine

## 2014-03-27 NOTE — Telephone Encounter (Signed)
Patient unable to make appt today for ED f/u, would like to know if he is risking anything not coming. States he has not money for gas. Please advise

## 2014-04-12 NOTE — Telephone Encounter (Signed)
Patient out of pain meds. Unable to get refills from pharamacy due to owing money to them.  Please contact pat discuss other options.

## 2014-05-01 ENCOUNTER — Ambulatory Visit: Payer: Managed Care, Other (non HMO) | Admitting: Family Medicine

## 2014-05-10 ENCOUNTER — Encounter (HOSPITAL_COMMUNITY): Payer: Self-pay | Admitting: Emergency Medicine

## 2014-05-10 ENCOUNTER — Emergency Department (HOSPITAL_COMMUNITY)
Admission: EM | Admit: 2014-05-10 | Discharge: 2014-05-11 | Disposition: A | Discharge status: Home / Self Care | Payer: Self-pay | Attending: Emergency Medicine | Admitting: Emergency Medicine

## 2014-05-10 ENCOUNTER — Emergency Department (HOSPITAL_COMMUNITY): Payer: Self-pay

## 2014-05-10 ENCOUNTER — Emergency Department (HOSPITAL_COMMUNITY): Payer: Managed Care, Other (non HMO)

## 2014-05-10 DIAGNOSIS — I4891 Unspecified atrial fibrillation: Secondary | ICD-10-CM | POA: Insufficient documentation

## 2014-05-10 DIAGNOSIS — K219 Gastro-esophageal reflux disease without esophagitis: Secondary | ICD-10-CM | POA: Insufficient documentation

## 2014-05-10 DIAGNOSIS — I482 Chronic atrial fibrillation, unspecified: Secondary | ICD-10-CM

## 2014-05-10 DIAGNOSIS — I509 Heart failure, unspecified: Secondary | ICD-10-CM | POA: Insufficient documentation

## 2014-05-10 DIAGNOSIS — R9389 Abnormal findings on diagnostic imaging of other specified body structures: Secondary | ICD-10-CM

## 2014-05-10 DIAGNOSIS — Z659 Problem related to unspecified psychosocial circumstances: Secondary | ICD-10-CM

## 2014-05-10 DIAGNOSIS — R079 Chest pain, unspecified: Secondary | ICD-10-CM | POA: Insufficient documentation

## 2014-05-10 DIAGNOSIS — Z792 Long term (current) use of antibiotics: Secondary | ICD-10-CM | POA: Insufficient documentation

## 2014-05-10 DIAGNOSIS — R918 Other nonspecific abnormal finding of lung field: Secondary | ICD-10-CM | POA: Insufficient documentation

## 2014-05-10 DIAGNOSIS — I10 Essential (primary) hypertension: Secondary | ICD-10-CM | POA: Insufficient documentation

## 2014-05-10 DIAGNOSIS — Z79899 Other long term (current) drug therapy: Secondary | ICD-10-CM | POA: Insufficient documentation

## 2014-05-10 DIAGNOSIS — Z609 Problem related to social environment, unspecified: Secondary | ICD-10-CM | POA: Insufficient documentation

## 2014-05-10 DIAGNOSIS — Z87891 Personal history of nicotine dependence: Secondary | ICD-10-CM | POA: Insufficient documentation

## 2014-05-10 HISTORY — DX: Heart failure, unspecified: I50.9

## 2014-05-10 LAB — CBC
HCT: 37.7 % — ABNORMAL LOW (ref 39.0–52.0)
HEMOGLOBIN: 13.2 g/dL (ref 13.0–17.0)
MCH: 32.2 pg (ref 26.0–34.0)
MCHC: 35 g/dL (ref 30.0–36.0)
MCV: 92 fL (ref 78.0–100.0)
PLATELETS: 355 10*3/uL (ref 150–400)
RBC: 4.1 MIL/uL — ABNORMAL LOW (ref 4.22–5.81)
RDW: 15.3 % (ref 11.5–15.5)
WBC: 11.2 10*3/uL — AB (ref 4.0–10.5)

## 2014-05-10 LAB — I-STAT CHEM 8, ED
BUN: <3 mg/dL — ABNORMAL LOW (ref 6–23)
BUN: <3 mg/dL — ABNORMAL LOW (ref 6–23)
CREATININE: 0.6 mg/dL (ref 0.50–1.35)
Calcium, Ion: 1.08 mmol/L — ABNORMAL LOW (ref 1.12–1.23)
Calcium, Ion: 1.08 mmol/L — ABNORMAL LOW (ref 1.12–1.23)
Chloride: 101 mEq/L (ref 96–112)
Chloride: 102 mEq/L (ref 96–112)
Creatinine, Ser: 0.6 mg/dL (ref 0.50–1.35)
Glucose, Bld: 116 mg/dL — ABNORMAL HIGH (ref 70–99)
Glucose, Bld: 117 mg/dL — ABNORMAL HIGH (ref 70–99)
HCT: 45 % (ref 39.0–52.0)
HEMATOCRIT: 45 % (ref 39.0–52.0)
HEMOGLOBIN: 15.3 g/dL (ref 13.0–17.0)
HEMOGLOBIN: 15.3 g/dL (ref 13.0–17.0)
Potassium: 3.6 mEq/L — ABNORMAL LOW (ref 3.7–5.3)
Potassium: 3.6 mEq/L — ABNORMAL LOW (ref 3.7–5.3)
SODIUM: 144 meq/L (ref 137–147)
SODIUM: 144 meq/L (ref 137–147)
TCO2: 28 mmol/L (ref 0–100)
TCO2: 29 mmol/L (ref 0–100)

## 2014-05-10 LAB — BASIC METABOLIC PANEL
ANION GAP: 9 (ref 5–15)
BUN: 4 mg/dL — ABNORMAL LOW (ref 6–23)
CHLORIDE: 103 meq/L (ref 96–112)
CO2: 26 meq/L (ref 19–32)
Calcium: 7.9 mg/dL — ABNORMAL LOW (ref 8.4–10.5)
Creatinine, Ser: 0.45 mg/dL — ABNORMAL LOW (ref 0.50–1.35)
GFR calc Af Amer: >90 mL/min (ref >90)
GFR calc non Af Amer: >90 mL/min (ref >90)
GLUCOSE: 108 mg/dL — AB (ref 70–99)
Potassium: 7.4 mEq/L (ref 3.7–5.3)
Sodium: 138 mEq/L (ref 137–147)

## 2014-05-10 LAB — I-STAT TROPONIN, ED
Troponin i, poc: 0 ng/mL (ref 0.00–0.08)
Troponin i, poc: 0 ng/mL (ref 0.00–0.08)

## 2014-05-10 MED ORDER — FAMOTIDINE 20 MG PO TABS: 20.0000 mg | ORAL_TABLET | Freq: Two times a day (BID) | ORAL | Status: DC

## 2014-05-10 MED ORDER — GI COCKTAIL ~~LOC~~
30.0000 mL | Freq: Once | ORAL | Status: AC
  Administered 2014-05-10: 30 mL via ORAL
  Filled 2014-05-10 (×2): qty 30

## 2014-05-10 MED ORDER — PANTOPRAZOLE SODIUM 40 MG IV SOLR
40.0000 mg | Freq: Once | INTRAVENOUS | Status: AC
Start: 2014-05-10 — End: 2014-05-10
  Administered 2014-05-10: 40 mg via INTRAVENOUS
  Filled 2014-05-10: qty 40

## 2014-05-10 NOTE — ED Notes (Signed)
Per [facility], pt from home, started complaining of CP off and on today. C/o dizziness when he stands. Pt has hx of afib. CP gets worse with palpation, but not with inspiration. Pt is AAOX4. CP in center of chest, denies radiation. Pt given 4 zofran with no relief. CP 1/10. Pt in NAD. Pt also states he has a hx of acid reflux.

## 2014-05-10 NOTE — Discharge Instructions (Signed)
Atrial Fibrillation Atrial fibrillation is a condition that causes your heart to beat irregularly. It may also cause your heart to beat faster than normal. Atrial fibrillation can prevent your heart from pumping blood normally. It increases your risk of stroke and heart problems. HOME CARE  Take medications as told by your doctor.  Only take medications that your doctor says are safe. Some medications can make the condition worse or happen again.  If blood thinners were prescribed by your doctor, take them exactly as told. Too much can cause bleeding. Too little and you will not have the needed protection against stroke and other problems.  Perform blood tests at home if told by your doctor.  Perform blood tests exactly as told by your doctor.  Do not drink alcohol.  Do not drink beverages with caffeine such as coffee, soda, and some teas.  Maintain a healthy weight.  Do not use diet pills unless your doctor says they are safe. They may make heart problems worse.  Follow diet instructions as told by your doctor.  Exercise regularly as told by your doctor.  Keep all follow-up appointments. GET HELP IF:  You notice a change in the speed, rhythm, or strength of your heartbeat.  You suddenly begin peeing (urinating) more often.  You get tired more easily when moving or exercising. GET HELP RIGHT AWAY IF:   You have chest or belly (abdominal) pain.  You feel sick to your stomach (nauseous).  You are short of breath.  You suddenly have swollen feet and ankles.  You feel dizzy.  You face, arms, or legs feel numb or weak.  There is a change in your vision or speech. MAKE SURE YOU:   Understand these instructions.  Will watch your condition.  Will get help right away if you are not doing well or get worse. Document Released: 06/24/2008 Document Revised: 01/30/2014 Document Reviewed: 10/26/2012 [facility] Patient Information 2015 [place], [place]. This information is not  intended to replace advice given to you by your health care provider. Make sure you discuss any questions you have with your health care provider.  Heartburn Heartburn is a painful, burning sensation in the chest. It may feel worse in certain positions, such as lying down or bending over. It is caused by stomach acid backing up into the tube that carries food from the mouth down to the stomach (lower esophagus).  CAUSES   Large meals.  Certain foods and drinks.  Exercise.  Increased acid production.  Being overweight or obese.  Certain medicines. SYMPTOMS   Burning pain in the chest or lower throat.  Bitter taste in the mouth.  Coughing. DIAGNOSIS  If the usual treatments for heartburn do not improve your symptoms, then tests may be done to see if there is another condition present. Possible tests may include:  X-rays.  Endoscopy. This is when a tube with a light and a camera on the end is used to examine the esophagus and the stomach.  A test to measure the amount of acid in the esophagus (pH test).  A test to see if the esophagus is working properly (esophageal manometry).  Blood, breath, or stool tests to check for bacteria that cause ulcers. TREATMENT   Your caregiver may tell you to use certain over-the-counter medicines (antacids, acid reducers) for mild heartburn.  Your caregiver may prescribe medicines to decrease the acid in your stomach or protect your stomach lining.  Your caregiver may recommend certain diet changes.  For severe cases,  your caregiver may recommend that the head of your bed be elevated on blocks. (Sleeping with more pillows is not an effective treatment as it only changes the position of your head and does not improve the main problem of stomach acid refluxing into the esophagus.) HOME CARE INSTRUCTIONS   Take all medicines as directed by your caregiver.  Raise the head of your bed by putting blocks under the legs if instructed to by your  caregiver.  Do not exercise right after eating.  Avoid eating 2 or 3 hours before bed. Do not lie down right after eating.  Eat small meals throughout the day instead of 3 large meals.  Stop smoking if you smoke.  Maintain a healthy weight.  Identify foods and beverages that make your symptoms worse and avoid them. Foods you may want to avoid include:  Peppers.  Chocolate.  High-fat foods, including fried foods.  Spicy foods.  Garlic and onions.  Citrus fruits, including oranges, grapefruit, lemons, and limes.  Food containing tomatoes or tomato products.  Mint.  Carbonated drinks, caffeinated drinks, and alcohol.  Vinegar. SEEK IMMEDIATE MEDICAL CARE IF:  You have severe chest pain that goes down your arm or into your jaw or neck.  You feel sweaty, dizzy, or lightheaded.  You are short of breath.  You vomit blood.  You have difficulty or pain with swallowing.  You have bloody or black, tarry stools.  You have episodes of heartburn more than 3 times a week for more than 2 weeks. MAKE SURE YOU:  Understand these instructions.  Will watch your condition.  Will get help right away if you are not doing well or get worse. Document Released: 02/01/2009 Document Revised: 12/08/2011 Document Reviewed: 03/02/2011 [facility] Patient Information 2015 [place], [place]. This information is not intended to replace advice given to you by your health care provider. Make sure you discuss any questions you have with your health care provider. Incidental Abnormal Radiological Finding An incidental abnormal radiologic finding is a very small mass or scar tissue, detected anywhere in the body, unrelated to the reason for your visit. With newer imaging tests and technologies, it is becoming more common to detect small masses and tissue abnormalities. It is important for you to work with your caregiver because it can sometimes be related to undiagnosed illness or other symptoms. Most  often however, the finding is not causing symptoms and is not a cause for concern. TYPES OF FINDINGS Abnormal radiologic findings are often located in the kidneys or lungs, but they can also be found in the heart, liver, breasts, brain, gallbladder, uterus and other surrounding organs and tissues. There are many types of masses and tissue abnormalities that can be detected during an imaging test. These may include:  Lesions - changes in tissue due to infection, tissue death, or trauma.  Cysts - a sac filled with fluid, crystals, or some other substance.  Tumors - non-cancerous or cancerous solid formation. You may hear medical terms, such as, "pulmonary nodule" (a small mass in the lung) or "renal mass" (mass in the kidney). Ask your caregiver if these terms apply to your findings.  DO I NEED FURTHER DIAGNOSIS? There are many possible causes of incidental radiologic findings. Your caregiver will determine whether it requires additional screening tests, diagnostic tests, treatments, or referral to a surgeon.Generally, very small tissue changes or masses will not require any follow up testing. Much research has been done in this area.These very small abnormalities are considered low risk of  becoming a problem in the future.  Depending on the size and appearance of the finding, your caregiver may recommend additional testing.Additional testing may also be recommended if you have certain risk factors or medical conditions that increase your risk of related problems. It is a good idea to have additional testing if you have other symptoms or concerns. Sometimes, these early findings can give you a chance at early treatment and avoid problems in the future. TESTING AND DIAGNOSIS Tests and exams may be a one-time screening or periodic follow-up. Periodic follow-up will help your caregiver determine whether the abnormality is growing and becoming a concern. Tests may include:  Physical  examination.  Blood tests.  Urine tests.  Imaging tests, such as abdominal ultrasound, CT scan, or MRI.  Biopsy. TREATMENT Treatment varies, depending on the cause, location, size and appearance of the finding. Treatment will also depend on your age and underlying conditions or symptoms. Sometimes treatment is not necessary at all. However, treatment may include:  Watchful waiting with periodic examination and testing.  Treatments to reduce the size of the abnormality.  Surgical or biopsy removal of the abnormality.  Additional treatments to address any underlying conditions. HOME CARE INSTRUCTIONS   See your caregiver for follow up examination and testing as directed. It is important that you schedule appointments as directed.  Keep calm. Your caregiver will let you know if this is a routine follow up, or if there is a reason for concern. Remember, early detection can be very beneficial to you.  Follow all of your caregiver's aftercare instructions related to the reason for your visit. Document Released: 12/31/2010 Document Revised: 12/08/2011 Document Reviewed: 12/31/2010 [facility] Patient Information 2015 [place], [place]. This information is not intended to replace advice given to you by your health care provider. Make sure you discuss any questions you have with your health care provider.

## 2014-05-10 NOTE — ED Notes (Signed)
PT monitored by pulse ox, bp cuff, and 12-lead. 

## 2014-05-10 NOTE — ED Notes (Signed)
Critical lab value potassium, 7.4, blood hemolyzed, will redraw.

## 2014-05-10 NOTE — ED Notes (Signed)
Pt returned from x-ray. Pt monitored by pulse ox, bp cuff, and 12-lead. PT refuses to lay in bed. PT sitting on edge of bed with call bell in reach. Door left open.

## 2014-05-10 NOTE — ED Notes (Signed)
Pt returned from xray

## 2014-05-10 NOTE — ED Notes (Signed)
Pt states that he in not able to pay for transportation to go home, bus pass offer to the pt and he refuses. Pt oriented that he we need to wait for transportation on the waiting room.

## 2014-05-15 NOTE — ED Provider Notes (Signed)
CSN: [id number]     Arrival date & time 05/10/14  2013 History   First MD Initiated Contact with Patient 05/10/14 2027     Chief Complaint  Patient presents with  . Chest Pain      HPI Per [facility], pt from home, started complaining of CP off and on today. C/o dizziness when he stands. Pt has hx of afib. CP gets worse with palpation, but not with inspiration. Pt is AAOX4. CP in center of chest, denies radiation. Pt given 4 zofran with no relief. CP 1/10. Pt in NAD. Pt also states he has a hx of acid reflux.  He has not been taking his medication hysterectomy.  Past Medical History  Diagnosis Date  . Atrial fibrillation, chronic   . Hypertension   . GERD (gastroesophageal reflux disease)   . Septic shock   . CHF (congestive heart failure)    Past Surgical History  Procedure Laterality Date  . Splenectomy    . Tonsilectomy, adenoidectomy, bilateral myringotomy and tubes    . Knee arthroscopy    . Hernia repair     No family history on file. History  Substance Use Topics  . Smoking status: Former Smoker    Types: Cigarettes    Quit date: 11/01/1974  . Smokeless tobacco: Not on file     Comment: smoked in high school for about a year  . Alcohol Use: Yes    Review of Systems  All other systems reviewed and are negative  Allergies  Potassium-containing compounds; Augmentin; and Neomycin  Home Medications   Prior to Admission medications   Medication Sig Start Date End Date Taking? Authorizing Provider  ibuprofen (ADVIL,MOTRIN) 200 MG tablet Take 400 mg by mouth every 6 (six) hours as needed for moderate pain.   Yes Historical Provider, MD  omeprazole (PRILOSEC OTC) 20 MG tablet Take 20 mg by mouth daily as needed (for heart burn).   Yes Historical Provider, MD  benazepril (LOTENSIN) 10 MG tablet Take 1 tablet (10 mg total) by mouth daily. 01/30/14   Gerda Diss, DO  diltiazem [facility]) 240 MG 24 hr capsule Take 240 mg by mouth daily.    Historical Provider, MD  famotidine  (PEPCID) 20 MG tablet Take 1 tablet (20 mg total) by mouth 2 (two) times daily. 05/10/14   Dot Lanes, MD  folic acid (FOLVITE) 1 MG tablet Take 1 mg by mouth daily.    Historical Provider, MD  furosemide (LASIX) 20 MG tablet Take 1 tablet (20 mg total) by mouth daily. 01/30/14   Gerda Diss, DO  Multiple Vitamin (MULTIVITAMIN WITH MINERALS) TABS tablet Take 1 tablet by mouth daily. 11/04/13   Coral Spikes, DO  mupirocin cream (BACTROBAN) 2 % Apply topically daily. 01/23/14   Cordelia Poche, MD  thiamine 100 MG tablet Take 1 tablet (100 mg total) by mouth daily. 11/04/13   Jayce G Cook, DO   BP 139/77  Pulse 98  Temp(Src) 98.4 F (36.9 C) (Oral)  Resp 20  SpO2 96% Physical Exam Physical Exam  Nursing note and vitals reviewed. Constitutional: Massive obesity. He is oriented to person, place, and time. He appears well-developed and well-nourished. No distress.  HENT:  Head: Normocephalic and atraumatic.  Eyes: Pupils are equal, round, and reactive to light.  Neck: Normal range of motion.  Cardiovascular: Normal rate and intact distal pulses.   Pulmonary/Chest: No respiratory distress.  Abdominal: Obese with large pannus. Normal appearance. He exhibits no distension.  Musculoskeletal:  Normal range of motion.  Neurological: He is alert and oriented to person, place, and time. No cranial nerve deficit.  Skin: Skin is warm and dry. No rash noted.  Psychiatric: He has a normal mood and affect. His behavior is normal.   ED Course  Procedures (including critical care time) Labs Review Labs Reviewed  CBC - Abnormal; Notable for the following:    WBC 11.2 (*)    RBC 4.10 (*)    HCT 37.7 (*)    All other components within normal limits  BASIC METABOLIC PANEL - Abnormal; Notable for the following:    Potassium 7.4 (*)    Glucose, Bld 108 (*)    BUN 4 (*)    Creatinine, Ser 0.45 (*)    Calcium 7.9 (*)    All other components within normal limits  I-STAT CHEM 8, ED - Abnormal; Notable  for the following:    Potassium 3.6 (*)    BUN <3 (*)    Glucose, Bld 116 (*)    Calcium, Ion 1.08 (*)    All other components within normal limits  I-STAT CHEM 8, ED - Abnormal; Notable for the following:    Potassium 3.6 (*)    BUN <3 (*)    Glucose, Bld 117 (*)    Calcium, Ion 1.08 (*)    All other components within normal limits  I-STAT TROPOININ, ED  I-STAT TROPOININ, ED    Imaging Review IMPRESSION: 1. Vascular congestion, with increased interstitial markings, raising concern for mild interstitial edema. 2. Persistent small right pleural effusion, with associated airspace consolidation, relatively stable since March. This could reflect chronic sequelae of infection or the patient's hepatic cirrhosis, though malignancy cannot be entirely excluded. Would consider correlation with cytology, if not previously performed.    EKG Interpretation   Date/Time:  Wednesday May 10 2014 20:23:28 EDT Ventricular Rate:  94 PR Interval:    QRS Duration: 84 QT Interval:  364 QTC Calculation: 455 R Axis:   -60 Text Interpretation:  Atrial fibrillation Inferior infarct, old Anterior  infarct, old Abnormal ekg Confirmed by Tyshaun Vinzant  MD, Joyanna Kleman ([id number]) on  05/10/2014 8:28:52 PM     I discussed the abnormal chest x-ray findings with the patient.  He did similar findings in the past and did not followup but he stated he would followup with the family practice office.  He does not want to be admitted.  After treatment in the ED the patient feels back to baseline and wants to go home. MDM   Final diagnoses:  Gastroesophageal reflux disease without esophagitis  Poor social situation  Chronic atrial fibrillation  Abnormal chest x-ray        Dot Lanes, MD 05/15/14 [phone]

## 2014-05-22 ENCOUNTER — Ambulatory Visit: Payer: Managed Care, Other (non HMO) | Admitting: Family Medicine

## 2014-07-14 ENCOUNTER — Emergency Department (HOSPITAL_COMMUNITY)
Admission: EM | Admit: 2014-07-14 | Discharge: 2014-07-14 | Disposition: A | Discharge status: Home / Self Care | Payer: Managed Care, Other (non HMO) | Attending: Emergency Medicine | Admitting: Emergency Medicine

## 2014-07-14 ENCOUNTER — Encounter (HOSPITAL_COMMUNITY): Payer: Self-pay | Admitting: Emergency Medicine

## 2014-07-14 DIAGNOSIS — F10920 Alcohol use, unspecified with intoxication, uncomplicated: Secondary | ICD-10-CM

## 2014-07-14 DIAGNOSIS — Z79899 Other long term (current) drug therapy: Secondary | ICD-10-CM | POA: Insufficient documentation

## 2014-07-14 DIAGNOSIS — I509 Heart failure, unspecified: Secondary | ICD-10-CM | POA: Insufficient documentation

## 2014-07-14 DIAGNOSIS — I890 Lymphedema, not elsewhere classified: Secondary | ICD-10-CM | POA: Insufficient documentation

## 2014-07-14 DIAGNOSIS — I482 Chronic atrial fibrillation: Secondary | ICD-10-CM | POA: Insufficient documentation

## 2014-07-14 DIAGNOSIS — R45851 Suicidal ideations: Secondary | ICD-10-CM | POA: Insufficient documentation

## 2014-07-14 DIAGNOSIS — K219 Gastro-esophageal reflux disease without esophagitis: Secondary | ICD-10-CM | POA: Insufficient documentation

## 2014-07-14 DIAGNOSIS — Z87891 Personal history of nicotine dependence: Secondary | ICD-10-CM | POA: Insufficient documentation

## 2014-07-14 DIAGNOSIS — Z8619 Personal history of other infectious and parasitic diseases: Secondary | ICD-10-CM | POA: Insufficient documentation

## 2014-07-14 DIAGNOSIS — I10 Essential (primary) hypertension: Secondary | ICD-10-CM | POA: Insufficient documentation

## 2014-07-14 DIAGNOSIS — F1010 Alcohol abuse, uncomplicated: Secondary | ICD-10-CM | POA: Diagnosis present

## 2014-07-14 DIAGNOSIS — F4329 Adjustment disorder with other symptoms: Secondary | ICD-10-CM | POA: Diagnosis present

## 2014-07-14 LAB — COMPREHENSIVE METABOLIC PANEL
ALT: 20 U/L (ref 0–53)
AST: 54 U/L — ABNORMAL HIGH (ref 0–37)
Albumin: 1.9 g/dL — ABNORMAL LOW (ref 3.5–5.2)
Alkaline Phosphatase: 159 U/L — ABNORMAL HIGH (ref 39–117)
Anion gap: 12 (ref 5–15)
BUN: 6 mg/dL (ref 6–23)
CO2: 26 mEq/L (ref 19–32)
Calcium: 7.9 mg/dL — ABNORMAL LOW (ref 8.4–10.5)
Chloride: 109 mEq/L (ref 96–112)
Creatinine, Ser: 0.69 mg/dL (ref 0.50–1.35)
GFR calc Af Amer: >90 mL/min (ref >90)
GFR calc non Af Amer: >90 mL/min (ref >90)
Glucose, Bld: 113 mg/dL — ABNORMAL HIGH (ref 70–99)
Potassium: 3.4 mEq/L — ABNORMAL LOW (ref 3.7–5.3)
Sodium: 147 mEq/L (ref 137–147)
Total Bilirubin: 0.7 mg/dL (ref 0.3–1.2)
Total Protein: 7.6 g/dL (ref 6.0–8.3)

## 2014-07-14 LAB — RAPID URINE DRUG SCREEN, HOSP PERFORMED
AMPHETAMINES: NOT DETECTED
BENZODIAZEPINES: NOT DETECTED
Barbiturates: NOT DETECTED
Cocaine: NOT DETECTED
Opiates: NOT DETECTED
Tetrahydrocannabinol: NOT DETECTED

## 2014-07-14 LAB — ETHANOL: Alcohol, Ethyl (B): 270 mg/dL — ABNORMAL HIGH (ref 0–11)

## 2014-07-14 LAB — CBC
HCT: 37.6 % — ABNORMAL LOW (ref 39.0–52.0)
Hemoglobin: 13 g/dL (ref 13.0–17.0)
MCH: 33.1 pg (ref 26.0–34.0)
MCHC: 34.6 g/dL (ref 30.0–36.0)
MCV: 95.7 fL (ref 78.0–100.0)
PLATELETS: 352 10*3/uL (ref 150–400)
RBC: 3.93 MIL/uL — ABNORMAL LOW (ref 4.22–5.81)
RDW: 17.9 % — AB (ref 11.5–15.5)
WBC: 8.8 10*3/uL (ref 4.0–10.5)

## 2014-07-14 LAB — SALICYLATE LEVEL

## 2014-07-14 MED ORDER — ONDANSETRON HCL 4 MG PO TABS: 4.0000 mg | ORAL_TABLET | Freq: Three times a day (TID) | ORAL | Status: DC | PRN

## 2014-07-14 MED ORDER — BENAZEPRIL HCL 10 MG PO TABS
10.0000 mg | ORAL_TABLET | Freq: Every day | ORAL | Status: DC
  Administered 2014-07-14: 10 mg via ORAL
  Filled 2014-07-14: qty 1

## 2014-07-14 MED ORDER — FUROSEMIDE 20 MG PO TABS
20.0000 mg | ORAL_TABLET | Freq: Every day | ORAL | Status: DC
  Administered 2014-07-14: 20 mg via ORAL
  Filled 2014-07-14: qty 1

## 2014-07-14 MED ORDER — POTASSIUM CHLORIDE CRYS ER 20 MEQ PO TBCR
40.0000 meq | EXTENDED_RELEASE_TABLET | Freq: Once | ORAL | Status: AC
  Administered 2014-07-14: 40 meq via ORAL
  Filled 2014-07-14: qty 2

## 2014-07-14 MED ORDER — FOLIC ACID 1 MG PO TABS: 1.0000 mg | ORAL_TABLET | Freq: Every day | ORAL | Status: DC

## 2014-07-14 MED ORDER — PANTOPRAZOLE SODIUM 40 MG PO TBEC: 40.0000 mg | DELAYED_RELEASE_TABLET | Freq: Every day | ORAL | Status: DC | PRN

## 2014-07-14 MED ORDER — ADULT MULTIVITAMIN W/MINERALS CH: 1.0000 | ORAL_TABLET | Freq: Every day | ORAL | Status: DC

## 2014-07-14 MED ORDER — NICOTINE 21 MG/24HR TD PT24: 21.0000 mg | MEDICATED_PATCH | Freq: Every day | TRANSDERMAL | Status: DC

## 2014-07-14 MED ORDER — VITAMIN B-1 100 MG PO TABS
100.0000 mg | ORAL_TABLET | Freq: Every day | ORAL | Status: DC
  Administered 2014-07-14: 100 mg via ORAL
  Filled 2014-07-14: qty 1

## 2014-07-14 MED ORDER — FOLIC ACID 1 MG PO TABS
1.0000 mg | ORAL_TABLET | Freq: Every day | ORAL | Status: DC
  Administered 2014-07-14: 1 mg via ORAL
  Filled 2014-07-14: qty 1

## 2014-07-14 MED ORDER — DILTIAZEM HCL ER BEADS 240 MG PO CP24: 240.0000 mg | ORAL_CAPSULE | Freq: Every day | ORAL | Status: DC

## 2014-07-14 MED ORDER — FUROSEMIDE 20 MG PO TABS: 20.0000 mg | ORAL_TABLET | Freq: Every day | ORAL | Status: DC

## 2014-07-14 MED ORDER — BENAZEPRIL HCL 10 MG PO TABS: 10.0000 mg | ORAL_TABLET | Freq: Every day | ORAL | Status: DC

## 2014-07-14 MED ORDER — FAMOTIDINE 20 MG PO TABS: 20.0000 mg | ORAL_TABLET | Freq: Two times a day (BID) | ORAL | Status: DC

## 2014-07-14 MED ORDER — IBUPROFEN 200 MG PO TABS: 600.0000 mg | ORAL_TABLET | Freq: Three times a day (TID) | ORAL | Status: DC | PRN

## 2014-07-14 MED ORDER — FAMOTIDINE 20 MG PO TABS
20.0000 mg | ORAL_TABLET | Freq: Two times a day (BID) | ORAL | Status: DC
Start: 2014-07-14 — End: 2014-07-14
  Administered 2014-07-14 (×2): 20 mg via ORAL
  Filled 2014-07-14 (×2): qty 1

## 2014-07-14 MED ORDER — ALUM & MAG HYDROXIDE-SIMETH 200-200-20 MG/5ML PO SUSP: 30.0000 mL | ORAL | Status: DC | PRN

## 2014-07-14 MED ORDER — CEPHALEXIN 500 MG PO CAPS: 500.0000 mg | ORAL_CAPSULE | Freq: Four times a day (QID) | ORAL | Status: DC

## 2014-07-14 MED ORDER — DILTIAZEM HCL ER BEADS 240 MG PO CP24
240.0000 mg | ORAL_CAPSULE | Freq: Every day | ORAL | Status: DC
  Administered 2014-07-14: 240 mg via ORAL
  Filled 2014-07-14: qty 1

## 2014-07-14 MED ORDER — THIAMINE HCL 100 MG PO TABS: 100.0000 mg | ORAL_TABLET | Freq: Every day | ORAL | Status: DC

## 2014-07-14 MED ORDER — CEPHALEXIN 500 MG PO CAPS
500.0000 mg | ORAL_CAPSULE | Freq: Four times a day (QID) | ORAL | Status: DC
  Administered 2014-07-14 (×2): 500 mg via ORAL
  Filled 2014-07-14 (×2): qty 1

## 2014-07-14 NOTE — Consult Note (Signed)
[facility] Consult   Reason for Consult:  Alcohol abuse Referring Physician:  EDP  Matthew [name] is an 58 y.o. male. Total Time spent with patient: 20 minutes  Assessment: AXIS I:  Adjustment disorder with disturbance of emotion AXIS II:  Deferred AXIS III:   Past Medical History  Diagnosis Date  . Atrial fibrillation, chronic   . Hypertension   . GERD (gastroesophageal reflux disease)   . Septic shock   . CHF (congestive heart failure)    AXIS IV:  economic problems, housing problems and occupational problems AXIS V:  61-70 mild symptoms  Plan:  No evidence of imminent risk to self or others at present.  Dr. Darleene Cleaver assessed the patient and concurs with the plan.  Subjective:   Matthew [name] is a 58 y.o. male patient does not warrant admission.  HPI:  The patient stated he has a lot of financial stress.  His mother lived with him for 12 years and was like a mother to his son 57 yo) since he has had full custody of him since the age of 42.  She died rather suddenly last fall.  He became ill in Feb with septic shock and almost died, recovered but had a mild sepsis in May.  He drained his savings and retirement to survive.  Currently, he is being evicted and had plans to go to the [facility] family shelter until last night when his son decided he did not want to go and wanted to go live with his mother in [place].  The patient started drinking and called the [facility] to cancel their spot.  The lady on the phone was concerned and sent out a CPS welfare check.  They called the police and the patient "smarted off to the police and I ended up here."  He made a remark about suicide and brought to the ED.  No psychiatric hospitalizations or counseling or medications.  Denies suicidal/homicidal ideations at this time and wants to get back home to his son who he did not like the fact he was alone last night but the police approved it.  He also has job interviews next week that he wants to  attend.  He states he has some depression and anxiety over his situation but does not feel hopeless or feels he needs inpatient admission.  Matthew [name] is willing to go to outpatient services for assistance with his mental health.  Denies alcohol is a problem, no illegal drug use, denies hallucinations, no past suicide attempts. HPI Elements:   Location:  generalized. Quality:  acute. Severity:  mild. Timing:  brief. Duration:  brief. Context:  intoxication.  Past Psychiatric History: Past Medical History  Diagnosis Date  . Atrial fibrillation, chronic   . Hypertension   . GERD (gastroesophageal reflux disease)   . Septic shock   . CHF (congestive heart failure)     reports that he quit smoking about 39 years ago. His smoking use included Cigarettes. He smoked 0.00 packs per day. He does not have any smokeless tobacco history on file. He reports that he drinks alcohol. His drug history is not on file. History reviewed. No pertinent family history. Family History Substance Abuse: Yes, Describe: (Uncles ) Family Supports: Yes, List: (Brother ) Living Arrangements: Children Can pt return to current living arrangement?: Yes Abuse/Neglect [facility]) Physical Abuse: Denies Verbal Abuse: Denies Sexual Abuse: Denies Allergies:   Allergies  Allergen Reactions  . Potassium-Containing Compounds Other (See Comments)    Stomach  Pain  . Augmentin [Amoxicillin-Pot Clavulanate] Nausea Only  . Neomycin Rash    ACT Assessment Complete:  Yes:    Educational Status    Risk to Self: Risk to self with the past 6 months Suicidal Ideation: No-Not Currently/Within Last 6 Months Suicidal Intent: No-Not Currently/Within Last 6 Months Is patient at risk for suicide?: No Suicidal Plan?: No Access to Means: No What has been your use of drugs/alcohol within the last 12 months?: Occasional alcohol use.  Previous Attempts/Gestures: No How many times?: 0 Other Self Harm Risks: No other self harm risk identified  at this time.  Triggers for Past Attempts: None known Intentional Self Injurious Behavior: None Family Suicide History: No Recent stressful life event(s): Job Loss;Financial Problems;Other (Comment) (Loss of mother (10/14)) Persecutory voices/beliefs?: No Depression: Yes Depression Symptoms: Despondent;Insomnia;Tearfulness;Guilt;Feeling worthless/self pity;Feeling angry/irritable Substance abuse history and/or treatment for substance abuse?: Yes Suicide prevention information given to non-admitted patients: Not applicable  Risk to Others: Risk to Others within the past 6 months Homicidal Ideation: No Thoughts of Harm to Others: No Current Homicidal Intent: No Current Homicidal Plan: No Access to Homicidal Means: No Identified Victim: NA History of harm to others?: No Assessment of Violence: None Noted Violent Behavior Description: No violent behaviors observed. Pt is calm and cooperative at this time.  Does patient have access to weapons?: No Criminal Charges Pending?: No Does patient have a court date: No  Abuse: Abuse/Neglect Assessment (Assessment to be complete while patient is alone) Physical Abuse: Denies Verbal Abuse: Denies Sexual Abuse: Denies Exploitation of patient/patient's resources: Denies Self-Neglect: Denies  Prior Inpatient Therapy: Prior Inpatient Therapy Prior Inpatient Therapy: Yes Prior Therapy Dates: Jan. 2015 Prior Therapy Facilty/Provider(s): [facility] Reason for Treatment: Detox  Prior Outpatient Therapy: Prior Outpatient Therapy Prior Outpatient Therapy: No  Additional Information: Additional Information 1:1 In Past 12 Months?: Yes CIRT Risk: No Elopement Risk: No Does patient have medical clearance?: No                  Objective: Blood pressure 111/63, pulse 87, temperature 98.4 F (36.9 C), temperature source Oral, resp. rate 20, height _0  (1.778 m), weight 380 lb (172.367 kg), SpO2 98.00%.Body mass index is 54.52  kg/(m^2). Results for orders placed during the hospital encounter of 07/14/14 (from the past 72 hour(s))  CBC     Status: Abnormal   Collection Time    07/14/14  2:14 AM      Result Value Ref Range   WBC 8.8  4.0 - 10.5 K/uL   RBC 3.93 (*) 4.22 - 5.81 MIL/uL   Hemoglobin 13.0  13.0 - 17.0 g/dL   HCT 37.6 (*) 39.0 - 52.0 %   MCV 95.7  78.0 - 100.0 fL   MCH 33.1  26.0 - 34.0 pg   MCHC 34.6  30.0 - 36.0 g/dL   RDW 17.9 (*) 11.5 - 15.5 %   Platelets 352  150 - 400 K/uL  COMPREHENSIVE METABOLIC PANEL     Status: Abnormal   Collection Time    07/14/14  2:14 AM      Result Value Ref Range   Sodium 147  137 - 147 mEq/L   Potassium 3.4 (*) 3.7 - 5.3 mEq/L   Chloride 109  96 - 112 mEq/L   CO2 26  19 - 32 mEq/L   Glucose, Bld 113 (*) 70 - 99 mg/dL   BUN 6  6 - 23 mg/dL   Creatinine, Ser 0.69  0.50 - 1.35  mg/dL   Calcium 7.9 (*) 8.4 - 10.5 mg/dL   Total Protein 7.6  6.0 - 8.3 g/dL   Albumin 1.9 (*) 3.5 - 5.2 g/dL   AST 54 (*) 0 - 37 U/L   ALT 20  0 - 53 U/L   Alkaline Phosphatase 159 (*) 39 - 117 U/L   Total Bilirubin 0.7  0.3 - 1.2 mg/dL   GFR calc non Af Amer >90  >90 mL/min   GFR calc Af Amer >90  >90 mL/min   Comment: (NOTE)     The eGFR has been calculated using the CKD EPI equation.     This calculation has not been validated in all clinical situations.     eGFR's persistently <90 mL/min signify possible Chronic Kidney     Disease.   Anion gap 12  5 - 15  ETHANOL     Status: Abnormal   Collection Time    07/14/14  2:14 AM      Result Value Ref Range   Alcohol, Ethyl (B) 270 (*) 0 - 11 mg/dL   Comment:            LOWEST DETECTABLE LIMIT FOR     SERUM ALCOHOL IS 11 mg/dL     FOR MEDICAL PURPOSES ONLY  SALICYLATE LEVEL     Status: Abnormal   Collection Time    07/14/14  2:14 AM      Result Value Ref Range   Salicylate Lvl <6.5 (*) 2.8 - 20.0 mg/dL  URINE RAPID DRUG SCREEN (HOSP PERFORMED)     Status: None   Collection Time    07/14/14  4:00 AM      Result Value Ref  Range   Opiates NONE DETECTED  NONE DETECTED   Cocaine NONE DETECTED  NONE DETECTED   Benzodiazepines NONE DETECTED  NONE DETECTED   Amphetamines NONE DETECTED  NONE DETECTED   Tetrahydrocannabinol NONE DETECTED  NONE DETECTED   Barbiturates NONE DETECTED  NONE DETECTED   Comment:            DRUG SCREEN FOR MEDICAL PURPOSES     ONLY.  IF CONFIRMATION IS NEEDED     FOR ANY PURPOSE, NOTIFY LAB     WITHIN 5 DAYS.                LOWEST DETECTABLE LIMITS     FOR URINE DRUG SCREEN     Drug Class       Cutoff (ng/mL)     Amphetamine      1000     Barbiturate      200     Benzodiazepine   035     Tricyclics       465     Opiates          300     Cocaine          300     THC              50   Labs are reviewed and are pertinent for no medical issues not being addressed.  Current Facility-Administered Medications  Medication Dose Route Frequency Provider Last Rate Last Dose  . alum & mag hydroxide-simeth (MAALOX/MYLANTA) 200-200-20 MG/5ML suspension 30 mL  30 mL Oral PRN Kristen N Ward, DO      . benazepril (LOTENSIN) tablet 10 mg  10 mg Oral Daily Dorie Rank, MD      . cephALEXin (KEFLEX) capsule 500 mg  500 mg  Oral 4 times per day Dorie Rank, MD   500 mg at 07/14/14 0449  . diltiazem (TIAZAC) 24 hr capsule 240 mg  240 mg Oral Daily Dorie Rank, MD      . famotidine (PEPCID) tablet 20 mg  20 mg Oral BID Dorie Rank, MD   20 mg at 07/14/14 0332  . folic acid (FOLVITE) tablet 1 mg  1 mg Oral Daily Dorie Rank, MD      . furosemide (LASIX) tablet 20 mg  20 mg Oral Daily Dorie Rank, MD      . ibuprofen (ADVIL,MOTRIN) tablet 600 mg  600 mg Oral Q8H PRN Kristen N Ward, DO      . nicotine (NICODERM CQ - dosed in mg/24 hours) patch 21 mg  21 mg Transdermal Daily Kristen N Ward, DO      . ondansetron (ZOFRAN) tablet 4 mg  4 mg Oral Q8H PRN Kristen N Ward, DO      . pantoprazole (PROTONIX) EC tablet 40 mg  40 mg Oral Daily PRN Dorie Rank, MD      . thiamine (VITAMIN B-1) tablet 100 mg  100 mg Oral Daily  Dorie Rank, MD       Current Outpatient Prescriptions  Medication Sig Dispense Refill  . omeprazole (PRILOSEC OTC) 20 MG tablet Take 20 mg by mouth daily as needed (for heart burn).      . benazepril (LOTENSIN) 10 MG tablet Take 1 tablet (10 mg total) by mouth daily.  30 tablet  1  . diltiazem (TIAZAC) 240 MG 24 hr capsule Take 240 mg by mouth daily.      . famotidine (PEPCID) 20 MG tablet Take 1 tablet (20 mg total) by mouth 2 (two) times daily.  30 tablet  0  . folic acid (FOLVITE) 1 MG tablet Take 1 mg by mouth daily.      . furosemide (LASIX) 20 MG tablet Take 1 tablet (20 mg total) by mouth daily.  30 tablet  1  . ibuprofen (ADVIL,MOTRIN) 200 MG tablet Take 400 mg by mouth every 6 (six) hours as needed for moderate pain.      . Multiple Vitamin (MULTIVITAMIN WITH MINERALS) TABS tablet Take 1 tablet by mouth daily.  30 tablet  0  . thiamine 100 MG tablet Take 1 tablet (100 mg total) by mouth daily.  30 tablet  0    Psychiatric Specialty Exam:     Blood pressure 111/63, pulse 87, temperature 98.4 F (36.9 C), temperature source Oral, resp. rate 20, height _0  (1.778 m), weight 380 lb (172.367 kg), SpO2 98.00%.Body mass index is 54.52 kg/(m^2).  General Appearance: Casual  Eye Contact::  Good  Speech:  Normal Rate  Volume:  Normal  Mood:  Depressed, mild situational  Affect:  Congruent  Thought Process:  Coherent  Orientation:  Full (Time, Place, and Person)  Thought Content:  WDL  Suicidal Thoughts:  No  Homicidal Thoughts:  No  Memory:  Immediate;   Good Recent;   Good Remote;   Good  Judgement:  Fair  Insight:  Fair  Psychomotor Activity:  Normal  Concentration:  Good  Recall:  Good  Fund of Knowledge:Good  Language: Good  Akathisia:  No  Handed:  Right  AIMS (if indicated):     Assets:  Desire for Improvement Leisure Time Resilience Social Support  Sleep:      Musculoskeletal: Strength & Muscle Tone: within normal limits Gait & Station: normal Patient  leans: N/A  Treatment Plan Summary: Discharge home with follow-up resources.  Waylan Boga, [place] 07/14/2014 10:20 AM  Patient seen, evaluated and I agree with notes by Nurse Practitioner. Corena Pilgrim, MD

## 2014-07-14 NOTE — [facility] Suicide Risk Assessment (Signed)
Suicide Risk Assessment  Discharge Assessment     Demographic Factors:  Male, Caucasian, Low socioeconomic status and Unemployed  Total Time spent with patient: 20 minutes   Psychiatric Specialty Exam:     Blood pressure 111/63, pulse 87, temperature 98.4 F (36.9 C), temperature source Oral, resp. rate 20, height 5\' 10"  (1.778 m), weight 380 lb (172.367 kg), SpO2 98.00%.Body mass index is 54.52 kg/(m^2).  General Appearance: Casual  Eye Contact::  Good  Speech:  Normal Rate  Volume:  Normal  Mood:  Depressed, mild situational  Affect:  Congruent  Thought Process:  Coherent  Orientation:  Full (Time, Place, and Person)  Thought Content:  WDL  Suicidal Thoughts:  No  Homicidal Thoughts:  No  Memory:  Immediate;   Good Recent;   Good Remote;   Good  Judgement:  Fair  Insight:  Fair  Psychomotor Activity:  Normal  Concentration:  Good  Recall:  Good  Fund of Knowledge:Good  Language: Good  Akathisia:  No  Handed:  Right  AIMS (if indicated):     Assets:  Desire for Improvement Leisure Time Resilience Social Support  Sleep:      Musculoskeletal: Strength & Muscle Tone: within normal limits Gait & Station: normal Patient leans: N/A  Mental Status Per Nursing Assessment::   On Admission:   intoxicated with "smarting off to the police"  Current Mental Status by Physician: NA  Loss Factors: Financial problems/change in socioeconomic status  Historical Factors: NA  Risk Reduction Factors:   Responsible for children under 80 years of age, Sense of responsibility to family, Living with another person, especially a relative, Positive social support and Positive coping skills or problem solving skills  Continued Clinical Symptoms:  Depression, mild, situational  Cognitive Features That Contribute To Risk:  None  Suicide Risk:  Minimal: No identifiable suicidal ideation.  Patients presenting with no risk factors but with morbid ruminations; may be classified  as minimal risk based on the severity of the depressive symptoms  Discharge Diagnoses:   AXIS I:  Adjustment disorder with disturbance of emotions AXIS II:  Deferred AXIS III:   Past Medical History  Diagnosis Date  . Atrial fibrillation, chronic   . Hypertension   . GERD (gastroesophageal reflux disease)   . Septic shock   . CHF (congestive heart failure)    AXIS IV:  economic problems, housing problems and occupational problems AXIS V:  61-70 mild symptoms  Plan Of Care/Follow-up recommendations:  Activity:  as tolerated Diet:  low-sodium heart healthy  Is patient on multiple antipsychotic therapies at discharge:  No   Has Patient had three or more failed trials of antipsychotic monotherapy by history:  No  Recommended Plan for Multiple Antipsychotic Therapies: NA    LORD, JAMISON, PMH-NP 07/14/2014, 10:23 AM

## 2014-07-14 NOTE — ED Notes (Signed)
Pt presents with depression, homelessness and suicidal gesture.  Pt reports he told the police he would charge at them so they could shoot him.  Pt reports he has temporary housing at the [facility] with his 57 year old son who decided to stay with his mother, causing him to be despondent.  Pt states he regrets making that statement to [employer].  Denies SI, HI or AV hallucinations at present.  Pt calm, cooperative, interactive.

## 2014-07-14 NOTE — BH Assessment (Signed)
Assessment completed. Consulted Darlyne Russian, PA-C who recommended that pt get treatment for his substance abuse issues if he was willing to go to treatment. Discussed recommendations with patient and pt stated "I can handle my substance use". Pt will need to be assess by psychiatry to determine disposition. Dr. Tomi Bamberger has been informed that pt is pending a psychiatric evaluation.

## 2014-07-14 NOTE — BH Assessment (Signed)
Assessment Note  Matthew [name] is an 58 y.o. male presenting to [facility] ED after being petitioned by [employer]. Pt reported that he has been under a lot of stress lately. Pt shared that he is currently being evicted and was in the process of going to the [facility] until his son told him that he wanted to live with his mother. Pt reported that while he was on the phone with Sharyn Lull from the [facility] he may have made a comment about harming himself. Pt also stated that when the police came to his house he told them to pull their guns and he would charge towards them.  Pt denies SI at this time but reported that he made references to either living in the streets or jumping off of a bridge on [place]. Pt also stated "I know suicide is not the solution, but this stress is incredible". Pt reported that he is dealing with multiple stressors such as financial, job loss, loss of his mother, pending eviction and his son moving to live with his mother. Pt reported that he has raised his son since he was 84 years old. Pt did not report any previous suicide attempts or psychiatric hospitalizations. Pt reported that he completed 2 weeks of a 4 week detox program at [facility] in January. Pt shared that he was unable to complete treatment due to having "a thing on my leg that they couldn't treat". Pt is endorsing multiple depressive symptoms and shared that he gets 4-5 hours of sleep and his appetite is improving. Pt reported that his appetite comes and goes and he has lost approximately 30lbs. Pt denied having access to firearms and did not report any upcoming court dates or pending criminal charges. Pt did not report any illicit substance use but reported that he occasionally drink alcohol. Pt did not report any physical, sexual or emotional abuse at this time.  Pt is alert and oriented at this time. Pt is calm and cooperative at this time. Pt maintained good eye contact. Pt speech is normal and thought process is coherent and  relevant. Pt mood is pleasant and his affect is congruent with his mood. Pt reported that his son lives in the home with him and he counts on his brother for support.   Axis I: Depressive Disorder NOS  Past Medical History:  Past Medical History  Diagnosis Date  . Atrial fibrillation, chronic   . Hypertension   . GERD (gastroesophageal reflux disease)   . Septic shock   . CHF (congestive heart failure)     Past Surgical History  Procedure Laterality Date  . Splenectomy    . Tonsilectomy, adenoidectomy, bilateral myringotomy and tubes    . Knee arthroscopy    . Hernia repair      Family History: History reviewed. No pertinent family history.  Social History:  reports that he quit smoking about 39 years ago. His smoking use included Cigarettes. He smoked 0.00 packs per day. He does not have any smokeless tobacco history on file. He reports that he drinks alcohol. His drug history is not on file.  Additional Social History:  Alcohol / Drug Use History of alcohol / drug use?: Yes Substance #1 Name of Substance 1: Alcohol  1 - Age of First Use: 15 1 - Amount (size/oz): "2 beers to a 1/5" 1 - Frequency: "once every 2-3 months" 1 - Duration: ongoing  1 - Last Use / Amount: 07-13-14 "1 pint of Tequila"  CIWA: CIWA-Ar BP:  125/70 mmHg Pulse Rate: 94 COWS:    Allergies:  Allergies  Allergen Reactions  . Potassium-Containing Compounds Other (See Comments)    Stomach Pain  . Augmentin [Amoxicillin-Pot Clavulanate] Nausea Only  . Neomycin Rash    Home Medications:  (Not in a hospital admission)  OB/GYN Status:  No LMP for male patient.  General Assessment Data Location of Assessment: [facility] ED Is this a Tele or Face-to-Face Assessment?: Face-to-Face Is this an Initial Assessment or a Re-assessment for this encounter?: Initial Assessment Living Arrangements: Children Can pt return to current living arrangement?: Yes Admission Status: Involuntary Is patient capable of  signing voluntary admission?: Yes Transfer from: Home Referral Source: Self/Family/Friend     [place] Living Arrangements: Children Name of Psychiatrist: None reported Name of Therapist: None reported  Education Status Is patient currently in school?: No Highest grade of school patient has completed: College  Risk to self with the past 6 months Suicidal Ideation: No-Not Currently/Within Last 6 Months Suicidal Intent: No-Not Currently/Within Last 6 Months Is patient at risk for suicide?: No Suicidal Plan?: No Access to Means: No What has been your use of drugs/alcohol within the last 12 months?: Occasional alcohol use.  Previous Attempts/Gestures: No How many times?: 0 Other Self Harm Risks: No other self harm risk identified at this time.  Triggers for Past Attempts: None known Intentional Self Injurious Behavior: None Family Suicide History: No Recent stressful life event(s): Job Loss;Financial Problems;Other (Comment) (Loss of mother (10/14)) Persecutory voices/beliefs?: No Depression: Yes Depression Symptoms: Despondent;Insomnia;Tearfulness;Guilt;Feeling worthless/self pity;Feeling angry/irritable Substance abuse history and/or treatment for substance abuse?: Yes Suicide prevention information given to non-admitted patients: Not applicable  Risk to Others within the past 6 months Homicidal Ideation: No Thoughts of Harm to Others: No Current Homicidal Intent: No Current Homicidal Plan: No Access to Homicidal Means: No Identified Victim: NA History of harm to others?: No Assessment of Violence: None Noted Violent Behavior Description: No violent behaviors observed. Pt is calm and cooperative at this time.  Does patient have access to weapons?: No Criminal Charges Pending?: No Does patient have a court date: No  Psychosis Hallucinations: None noted Delusions: None noted  Mental Status Report Appear/Hygiene: In hospital gown Eye Contact: Good Motor  Activity: Freedom of movement Speech: Logical/coherent Level of Consciousness: Quiet/awake Mood: Pleasant Affect: Appropriate to circumstance Anxiety Level: Minimal Thought Processes: Coherent;Relevant Judgement: Unimpaired Orientation: Person;Place;Time;Situation Obsessive Compulsive Thoughts/Behaviors: None  Cognitive Functioning Concentration: Normal Memory: Recent Intact;Remote Intact IQ: Average Insight: Fair Impulse Control: Fair Appetite: Fair Weight Loss: 30 Weight Gain: 0 Sleep: Decreased Total Hours of Sleep: 5 Vegetative Symptoms: Not bathing;Decreased grooming  ADLScreening [facility] Assessment Services) Patient's cognitive ability adequate to safely complete daily activities?: Yes Patient able to express need for assistance with ADLs?: Yes Independently performs ADLs?: Yes (appropriate for developmental age)  Prior Inpatient Therapy Prior Inpatient Therapy: Yes Prior Therapy Dates: Jan. 2015 Prior Therapy Facilty/Provider(s): [facility] Reason for Treatment: Detox  Prior Outpatient Therapy Prior Outpatient Therapy: No  ADL Screening (condition at time of admission) Patient's cognitive ability adequate to safely complete daily activities?: Yes Is the patient deaf or have difficulty hearing?: No Does the patient have difficulty seeing, even when wearing glasses/contacts?: No Does the patient have difficulty concentrating, remembering, or making decisions?: No Patient able to express need for assistance with ADLs?: Yes Does the patient have difficulty dressing or bathing?: No Independently performs ADLs?: Yes (appropriate for developmental age)       Abuse/Neglect Assessment (Assessment to  be complete while patient is alone) Physical Abuse: Denies Verbal Abuse: Denies Sexual Abuse: Denies Exploitation of patient/patient's resources: Denies Self-Neglect: Denies Values / Beliefs Cultural Requests During Hospitalization: None Spiritual Requests During  Hospitalization: None   Advance Directives (For Healthcare) Does patient have an advance directive?: No Would patient like information on creating an advanced directive?: No - patient declined information    Additional Information 1:1 In Past 12 Months?: Yes CIRT Risk: No Elopement Risk: No Does patient have medical clearance?: No     Disposition:  Disposition Initial Assessment Completed for this Encounter: Yes Disposition of Patient: Other dispositions Other disposition(s): Other (Comment) (Psychiatric evaluation in am to uphold or rescind IVC )  On Site Evaluation by:   Reviewed with Physician:    Alira Fretwell S 07/14/2014 6:20 AM

## 2014-07-14 NOTE — ED Notes (Signed)
ACT TEAM AT BEDSIDE FOR ASSESSMENT

## 2014-07-14 NOTE — ED Notes (Signed)
Pt has in belonging bag:  Blue checkered long sleeves, light brown pants, white underwear (pt has on), gray flip phone, black charger,brown shoes,  black wallet, [employer] card -7035, (2) [place] driver lic, (2) One's, (1) Five, (13) Twenties - total of 267 dollars with security.

## 2014-07-14 NOTE — Progress Notes (Signed)
[place],  Provided pt with a [employer] for primary care, information on [facility] [place] for outpatient mental health, and information on [facility] for homeless resources.

## 2014-07-14 NOTE — Progress Notes (Signed)
Pt psychiatrically stable per psychiatrist and NP. Patient provided with homeless resources. Patient denies SI/HI/AH/VH.    Noreene Larsson [phone]  ED CSW 07/14/2014 1054am

## 2014-07-14 NOTE — ED Notes (Signed)
Patient states he and his 57 year old son is being evicted from his home and he called [facility] so find placement for a few days, but son decided that he no longer wants to live with him and would prefer to go live with his mother in [place]. [facility] is a family facility so the patient can not go there to stay if his son refuses to go also. When asked by personnel at the facility what he plans to do, he stated " I guess I'll kill myself". Police was called to the home and he asked the police to "pull your guns, I'll rush you and you shoot". He states he wouldn't really harm himself but he is going through a lot.

## 2014-07-14 NOTE — ED Notes (Signed)
Bed: WA06 Expected date:  Expected time:  Means of arrival:  Comments: EMS/depression

## 2014-07-14 NOTE — ED Provider Notes (Signed)
CSN: [id number]     Arrival date & time 07/14/14  0055 History   First MD Initiated Contact with Patient 07/14/14 0204     Chief Complaint  Patient presents with  . Depression   HPI Patient presents to the burn sure him with complaints of depression. The patient has been having financial difficulties. He is getting evicted from his home. The patient had plans to go live at the [facility] with his son. He was told this evening by his son, that he wanted to go live with his mother.  The patient no longer can live at the [facility] now it is not sure what is going to do. The patient spoke with someone at the [facility] this evening and they were concerned and decided to call the police. Patient denies saying he wanted to harm himself but the police report that the person at the facility told the police that the patient said that" I guess I will [place]".  The patient does admit that when the police arrived he told them," pull your guns, and all rush you and you can to shoot me". Patient regrets saying this now and does not really want to harm himself.  He does complain of some redness on his foot. He has not noticed any recent increase in the swelling. He denies any chest pain or shortness of breath. Past Medical History  Diagnosis Date  . Atrial fibrillation, chronic   . Hypertension   . GERD (gastroesophageal reflux disease)   . Septic shock   . CHF (congestive heart failure)    Past Surgical History  Procedure Laterality Date  . Splenectomy    . Tonsilectomy, adenoidectomy, bilateral myringotomy and tubes    . Knee arthroscopy    . Hernia repair     History reviewed. No pertinent family history. History  Substance Use Topics  . Smoking status: Former Smoker    Types: Cigarettes    Quit date: 11/01/1974  . Smokeless tobacco: Not on file     Comment: smoked in high school for about a year  . Alcohol Use: Yes    Review of Systems  All other systems reviewed and are negative.     Allergies   Potassium-containing compounds; Augmentin; and Neomycin  Home Medications   Prior to Admission medications   Medication Sig Start Date End Date Taking? Authorizing Provider  omeprazole (PRILOSEC OTC) 20 MG tablet Take 20 mg by mouth daily as needed (for heart burn).   Yes Historical Provider, MD  benazepril (LOTENSIN) 10 MG tablet Take 1 tablet (10 mg total) by mouth daily. 01/30/14   Gerda Diss, DO  diltiazem [facility]) 240 MG 24 hr capsule Take 240 mg by mouth daily.    Historical Provider, MD  famotidine (PEPCID) 20 MG tablet Take 1 tablet (20 mg total) by mouth 2 (two) times daily. 05/10/14   Dot Lanes, MD  folic acid (FOLVITE) 1 MG tablet Take 1 mg by mouth daily.    Historical Provider, MD  furosemide (LASIX) 20 MG tablet Take 1 tablet (20 mg total) by mouth daily. 01/30/14   Gerda Diss, DO  ibuprofen (ADVIL,MOTRIN) 200 MG tablet Take 400 mg by mouth every 6 (six) hours as needed for moderate pain.    Historical Provider, MD  Multiple Vitamin (MULTIVITAMIN WITH MINERALS) TABS tablet Take 1 tablet by mouth daily. 11/04/13   Coral Spikes, DO  thiamine 100 MG tablet Take 1 tablet (100 mg total) by mouth  daily. 11/04/13   Coral Spikes, DO   BP 125/70  Pulse 94  Temp(Src) 98.2 F (36.8 C) (Oral)  Resp 20  Ht 5\' 10"  (1.778 m)  Wt 380 lb (172.367 kg)  BMI 54.52 kg/m2  SpO2 94% Physical Exam  Nursing note and vitals reviewed. Constitutional: He appears well-developed and well-nourished. No distress.  Morbidly obese  HENT:  Head: Normocephalic and atraumatic.  Right Ear: External ear normal.  Left Ear: External ear normal.  Eyes: Conjunctivae are normal. Right eye exhibits no discharge. Left eye exhibits no discharge. No scleral icterus.  Neck: Neck supple. No tracheal deviation present.  Cardiovascular: Normal rate, regular rhythm and intact distal pulses.   Pulmonary/Chest: Effort normal and breath sounds normal. No stridor. No respiratory distress. He has no wheezes. He has  no rales.  Abdominal: Soft. Bowel sounds are normal. He exhibits no distension. There is no tenderness. There is no rebound and no guarding.  Musculoskeletal: He exhibits edema. He exhibits no tenderness.  Chronic lymphedema of the bilateral lower extremities, no increased warmth or tenderness, erythema bilateral large base below the knee  Neurological: He is alert. He has normal strength. No cranial nerve deficit (no facial droop, extraocular movements intact, no slurred speech) or sensory deficit. He exhibits normal muscle tone. He displays no seizure activity. Coordination normal.  Skin: Skin is warm and dry. No rash noted.  Psychiatric: He has a normal mood and affect.    ED Course  Procedures (including critical care time) Labs Review Labs Reviewed  CBC - Abnormal; Notable for the following:    RBC 3.93 (*)    HCT 37.6 (*)    RDW 17.9 (*)    All other components within normal limits  COMPREHENSIVE METABOLIC PANEL - Abnormal; Notable for the following:    Potassium 3.4 (*)    Glucose, Bld 113 (*)    Calcium 7.9 (*)    Albumin 1.9 (*)    AST 54 (*)    Alkaline Phosphatase 159 (*)    All other components within normal limits  ETHANOL - Abnormal; Notable for the following:    Alcohol, Ethyl (B) 270 (*)    All other components within normal limits  SALICYLATE LEVEL - Abnormal; Notable for the following:    Salicylate Lvl <4.1 (*)    All other components within normal limits  URINE RAPID DRUG SCREEN (HOSP PERFORMED)     MDM   Final diagnoses:  Suicidal ideation  Alcohol intoxication, uncomplicated  Morbid obesity  Lymphedema    Patient has chronic lymphedema. He does have some erythema of his lower extremities. Patient's concerned he may be developing infection. We'll start him on Keflex and we will monitor his leg closely.  The patient presented with suicidal ideation. He denies this currently however patient had told the police earlier that he wanted them to shoot  him.  The patient is medically stable at this point. Consult with the psychiatry service    Dorie Rank, MD 07/14/14 [phone]

## 2014-07-18 ENCOUNTER — Inpatient Hospital Stay (HOSPITAL_COMMUNITY)
Admission: EM | Admit: 2014-07-18 | Discharge: 2014-07-20 | DRG: 918 | Disposition: A | Discharge status: Home / Self Care | Payer: Managed Care, Other (non HMO) | Attending: Family Medicine | Admitting: Family Medicine

## 2014-07-18 ENCOUNTER — Encounter (HOSPITAL_COMMUNITY): Payer: Self-pay | Admitting: Emergency Medicine

## 2014-07-18 DIAGNOSIS — F4329 Adjustment disorder with other symptoms: Secondary | ICD-10-CM

## 2014-07-18 DIAGNOSIS — F1019 Alcohol abuse with unspecified alcohol-induced disorder: Secondary | ICD-10-CM

## 2014-07-18 DIAGNOSIS — I509 Heart failure, unspecified: Secondary | ICD-10-CM | POA: Diagnosis present

## 2014-07-18 DIAGNOSIS — Z599 Problem related to housing and economic circumstances, unspecified: Secondary | ICD-10-CM

## 2014-07-18 DIAGNOSIS — Z87891 Personal history of nicotine dependence: Secondary | ICD-10-CM

## 2014-07-18 DIAGNOSIS — L03116 Cellulitis of left lower limb: Secondary | ICD-10-CM | POA: Diagnosis present

## 2014-07-18 DIAGNOSIS — Z79899 Other long term (current) drug therapy: Secondary | ICD-10-CM

## 2014-07-18 DIAGNOSIS — G4730 Sleep apnea, unspecified: Secondary | ICD-10-CM | POA: Diagnosis present

## 2014-07-18 DIAGNOSIS — I4891 Unspecified atrial fibrillation: Secondary | ICD-10-CM | POA: Diagnosis present

## 2014-07-18 DIAGNOSIS — Z9114 Patient's other noncompliance with medication regimen: Secondary | ICD-10-CM | POA: Diagnosis present

## 2014-07-18 DIAGNOSIS — I872 Venous insufficiency (chronic) (peripheral): Secondary | ICD-10-CM | POA: Diagnosis present

## 2014-07-18 DIAGNOSIS — K219 Gastro-esophageal reflux disease without esophagitis: Secondary | ICD-10-CM | POA: Diagnosis present

## 2014-07-18 DIAGNOSIS — F10129 Alcohol abuse with intoxication, unspecified: Secondary | ICD-10-CM | POA: Diagnosis present

## 2014-07-18 DIAGNOSIS — Z6841 Body Mass Index (BMI) 40.0 and over, adult: Secondary | ICD-10-CM

## 2014-07-18 DIAGNOSIS — Y908 Blood alcohol level of 240 mg/100 ml or more: Secondary | ICD-10-CM | POA: Diagnosis present

## 2014-07-18 DIAGNOSIS — IMO0002 Reserved for concepts with insufficient information to code with codable children: Secondary | ICD-10-CM | POA: Diagnosis present

## 2014-07-18 DIAGNOSIS — F10920 Alcohol use, unspecified with intoxication, uncomplicated: Secondary | ICD-10-CM

## 2014-07-18 DIAGNOSIS — E119 Type 2 diabetes mellitus without complications: Secondary | ICD-10-CM | POA: Diagnosis present

## 2014-07-18 DIAGNOSIS — F4321 Adjustment disorder with depressed mood: Secondary | ICD-10-CM

## 2014-07-18 DIAGNOSIS — F10120 Alcohol abuse with intoxication, uncomplicated: Secondary | ICD-10-CM

## 2014-07-18 DIAGNOSIS — I10 Essential (primary) hypertension: Secondary | ICD-10-CM | POA: Diagnosis present

## 2014-07-18 DIAGNOSIS — T50902A Poisoning by unspecified drugs, medicaments and biological substances, intentional self-harm, initial encounter: Secondary | ICD-10-CM

## 2014-07-18 DIAGNOSIS — L03115 Cellulitis of right lower limb: Secondary | ICD-10-CM | POA: Diagnosis present

## 2014-07-18 DIAGNOSIS — F1010 Alcohol abuse, uncomplicated: Secondary | ICD-10-CM

## 2014-07-18 DIAGNOSIS — F329 Major depressive disorder, single episode, unspecified: Secondary | ICD-10-CM | POA: Diagnosis present

## 2014-07-18 DIAGNOSIS — E876 Hypokalemia: Secondary | ICD-10-CM | POA: Diagnosis present

## 2014-07-18 DIAGNOSIS — I482 Chronic atrial fibrillation, unspecified: Secondary | ICD-10-CM

## 2014-07-18 DIAGNOSIS — T45512A Poisoning by anticoagulants, intentional self-harm, initial encounter: Principal | ICD-10-CM | POA: Diagnosis present

## 2014-07-18 DIAGNOSIS — F1994 Other psychoactive substance use, unspecified with psychoactive substance-induced mood disorder: Secondary | ICD-10-CM

## 2014-07-18 DIAGNOSIS — I480 Paroxysmal atrial fibrillation: Secondary | ICD-10-CM

## 2014-07-18 DIAGNOSIS — E11628 Type 2 diabetes mellitus with other skin complications: Secondary | ICD-10-CM

## 2014-07-18 DIAGNOSIS — IMO0001 Reserved for inherently not codable concepts without codable children: Secondary | ICD-10-CM

## 2014-07-18 DIAGNOSIS — E669 Obesity, unspecified: Secondary | ICD-10-CM

## 2014-07-18 DIAGNOSIS — E118 Type 2 diabetes mellitus with unspecified complications: Secondary | ICD-10-CM

## 2014-07-18 LAB — COMPREHENSIVE METABOLIC PANEL
ALBUMIN: 1.9 g/dL — AB (ref 3.5–5.2)
ALT: 23 U/L (ref 0–53)
ALT: 23 U/L (ref 0–53)
AST: 54 U/L — AB (ref 0–37)
AST: 55 U/L — AB (ref 0–37)
Albumin: 1.9 g/dL — ABNORMAL LOW (ref 3.5–5.2)
Alkaline Phosphatase: 167 U/L — ABNORMAL HIGH (ref 39–117)
Alkaline Phosphatase: 170 U/L — ABNORMAL HIGH (ref 39–117)
Anion gap: 12 (ref 5–15)
Anion gap: 10 (ref 5–15)
BILIRUBIN TOTAL: 0.6 mg/dL (ref 0.3–1.2)
BUN: 5 mg/dL — ABNORMAL LOW (ref 6–23)
BUN: 5 mg/dL — ABNORMAL LOW (ref 6–23)
CALCIUM: 7.6 mg/dL — AB (ref 8.4–10.5)
CALCIUM: 7.9 mg/dL — AB (ref 8.4–10.5)
CHLORIDE: 107 meq/L (ref 96–112)
CO2: 26 mEq/L (ref 19–32)
CO2: 27 mEq/L (ref 19–32)
CREATININE: 0.62 mg/dL (ref 0.50–1.35)
Chloride: 102 mEq/L (ref 96–112)
Creatinine, Ser: 0.62 mg/dL (ref 0.50–1.35)
GFR calc Af Amer: >90 mL/min (ref >90)
GFR calc non Af Amer: >90 mL/min (ref >90)
GLUCOSE: 163 mg/dL — AB (ref 70–99)
Glucose, Bld: 116 mg/dL — ABNORMAL HIGH (ref 70–99)
Potassium: 3.4 mEq/L — ABNORMAL LOW (ref 3.7–5.3)
Potassium: 3.1 mEq/L — ABNORMAL LOW (ref 3.7–5.3)
Sodium: 145 mEq/L (ref 137–147)
Sodium: 139 mEq/L (ref 137–147)
Total Bilirubin: 0.7 mg/dL (ref 0.3–1.2)
Total Protein: 7.8 g/dL (ref 6.0–8.3)
Total Protein: 7.8 g/dL (ref 6.0–8.3)

## 2014-07-18 LAB — CBC
HCT: 37.5 % — ABNORMAL LOW (ref 39.0–52.0)
Hemoglobin: 13.4 g/dL (ref 13.0–17.0)
MCH: 33.2 pg (ref 26.0–34.0)
MCHC: 35.7 g/dL (ref 30.0–36.0)
MCV: 92.8 fL (ref 78.0–100.0)
PLATELETS: 281 10*3/uL (ref 150–400)
RBC: 4.04 MIL/uL — ABNORMAL LOW (ref 4.22–5.81)
RDW: 17.8 % — AB (ref 11.5–15.5)
WBC: 8.7 10*3/uL (ref 4.0–10.5)

## 2014-07-18 LAB — GLUCOSE, CAPILLARY
GLUCOSE-CAPILLARY: 133 mg/dL — AB (ref 70–99)
Glucose-Capillary: 105 mg/dL — ABNORMAL HIGH (ref 70–99)
Glucose-Capillary: 134 mg/dL — ABNORMAL HIGH (ref 70–99)
Glucose-Capillary: 114 mg/dL — ABNORMAL HIGH (ref 70–99)

## 2014-07-18 LAB — CBC WITH DIFFERENTIAL/PLATELET
Basophils Absolute: 0.1 10*3/uL (ref 0.0–0.1)
Basophils Relative: 1 % (ref 0–1)
Eosinophils Absolute: 0.2 10*3/uL (ref 0.0–0.7)
Eosinophils Relative: 2 % (ref 0–5)
HEMATOCRIT: 38.2 % — AB (ref 39.0–52.0)
Hemoglobin: 13.5 g/dL (ref 13.0–17.0)
Lymphocytes Relative: 35 % (ref 12–46)
Lymphs Abs: 3.8 10*3/uL (ref 0.7–4.0)
MCH: 33 pg (ref 26.0–34.0)
MCHC: 35.3 g/dL (ref 30.0–36.0)
MCV: 93.4 fL (ref 78.0–100.0)
MONO ABS: 1.4 10*3/uL — AB (ref 0.1–1.0)
Monocytes Relative: 13 % — ABNORMAL HIGH (ref 3–12)
NEUTROS PCT: 49 % (ref 43–77)
Neutro Abs: 5.4 10*3/uL (ref 1.7–7.7)
Platelets: 276 10*3/uL (ref 150–400)
RBC: 4.09 MIL/uL — ABNORMAL LOW (ref 4.22–5.81)
RDW: 17.8 % — AB (ref 11.5–15.5)
WBC: 10.8 10*3/uL — ABNORMAL HIGH (ref 4.0–10.5)

## 2014-07-18 LAB — POC OCCULT BLOOD, ED: Fecal Occult Bld: NEGATIVE

## 2014-07-18 LAB — ETHANOL: ALCOHOL ETHYL (B): 255 mg/dL — AB (ref 0–11)

## 2014-07-18 LAB — RAPID URINE DRUG SCREEN, HOSP PERFORMED
Amphetamines: NOT DETECTED
Barbiturates: NOT DETECTED
Benzodiazepines: NOT DETECTED
Cocaine: NOT DETECTED
OPIATES: NOT DETECTED
Tetrahydrocannabinol: NOT DETECTED

## 2014-07-18 LAB — I-STAT TROPONIN, ED: Troponin i, poc: 0 ng/mL (ref 0.00–0.08)

## 2014-07-18 LAB — PROTIME-INR
INR: 2.28 — ABNORMAL HIGH (ref 0.00–1.49)
INR: 2.16 — ABNORMAL HIGH (ref 0.00–1.49)
PROTHROMBIN TIME: 25.3 s — AB (ref 11.6–15.2)
PROTHROMBIN TIME: 24.3 s — AB (ref 11.6–15.2)

## 2014-07-18 LAB — HEMOGLOBIN A1C
Hgb A1c MFr Bld: 5.6 % (ref <5.7)
MEAN PLASMA GLUCOSE: 114 mg/dL (ref <117)

## 2014-07-18 LAB — APTT: APTT: 43 s — AB (ref 24–37)

## 2014-07-18 LAB — SALICYLATE LEVEL

## 2014-07-18 LAB — ACETAMINOPHEN LEVEL

## 2014-07-18 MED ORDER — LORAZEPAM 2 MG/ML IJ SOLN: 1.0000 mg | Freq: Four times a day (QID) | INTRAMUSCULAR | Status: DC | PRN

## 2014-07-18 MED ORDER — LORAZEPAM 1 MG PO TABS: 1.0000 mg | ORAL_TABLET | Freq: Four times a day (QID) | ORAL | Status: DC | PRN

## 2014-07-18 MED ORDER — FOLIC ACID 1 MG PO TABS
1.0000 mg | ORAL_TABLET | Freq: Every day | ORAL | Status: DC
  Administered 2014-07-18 – 2014-07-20 (×3): 1 mg via ORAL
  Filled 2014-07-18 (×3): qty 1

## 2014-07-18 MED ORDER — INSULIN ASPART 100 UNIT/ML ~~LOC~~ SOLN
0.0000 [IU] | Freq: Three times a day (TID) | SUBCUTANEOUS | Status: DC
  Administered 2014-07-18 (×2): 2 [IU] via SUBCUTANEOUS
  Administered 2014-07-19: 3 [IU] via SUBCUTANEOUS
  Administered 2014-07-19: 18:00:00 via SUBCUTANEOUS

## 2014-07-18 MED ORDER — FUROSEMIDE 20 MG PO TABS
20.0000 mg | ORAL_TABLET | Freq: Every day | ORAL | Status: DC
  Administered 2014-07-18 – 2014-07-20 (×3): 20 mg via ORAL
  Filled 2014-07-18 (×3): qty 1

## 2014-07-18 MED ORDER — ONDANSETRON HCL 4 MG PO TABS
4.0000 mg | ORAL_TABLET | Freq: Four times a day (QID) | ORAL | Status: DC | PRN
  Administered 2014-07-19: 4 mg via ORAL
  Filled 2014-07-18: qty 1

## 2014-07-18 MED ORDER — PANTOPRAZOLE SODIUM 40 MG PO TBEC
40.0000 mg | DELAYED_RELEASE_TABLET | Freq: Every day | ORAL | Status: DC | PRN
  Filled 2014-07-18: qty 1

## 2014-07-18 MED ORDER — BENAZEPRIL HCL 10 MG PO TABS
10.0000 mg | ORAL_TABLET | Freq: Every day | ORAL | Status: DC
  Administered 2014-07-18: 10 mg via ORAL
  Filled 2014-07-18: qty 1

## 2014-07-18 MED ORDER — FAMOTIDINE 20 MG PO TABS
20.0000 mg | ORAL_TABLET | Freq: Two times a day (BID) | ORAL | Status: DC
  Administered 2014-07-18 – 2014-07-20 (×5): 20 mg via ORAL
  Filled 2014-07-18 (×6): qty 1

## 2014-07-18 MED ORDER — OMEPRAZOLE MAGNESIUM 20 MG PO TBEC: 20.0000 mg | DELAYED_RELEASE_TABLET | Freq: Every day | ORAL | Status: DC | PRN

## 2014-07-18 MED ORDER — THIAMINE HCL 100 MG PO TABS
100.0000 mg | ORAL_TABLET | Freq: Every day | ORAL | Status: DC
  Administered 2014-07-18 – 2014-07-20 (×3): 100 mg via ORAL
  Filled 2014-07-18 (×6): qty 1

## 2014-07-18 MED ORDER — CEPHALEXIN 500 MG PO CAPS
500.0000 mg | ORAL_CAPSULE | Freq: Four times a day (QID) | ORAL | Status: DC
  Administered 2014-07-18 – 2014-07-20 (×11): 500 mg via ORAL
  Filled 2014-07-18 (×12): qty 1

## 2014-07-18 MED ORDER — BENAZEPRIL HCL 5 MG PO TABS
5.0000 mg | ORAL_TABLET | Freq: Every day | ORAL | Status: DC
  Administered 2014-07-19 – 2014-07-20 (×2): 5 mg via ORAL
  Filled 2014-07-18 (×2): qty 1

## 2014-07-18 MED ORDER — DILTIAZEM HCL ER BEADS 240 MG PO CP24
240.0000 mg | ORAL_CAPSULE | Freq: Every day | ORAL | Status: DC
  Administered 2014-07-18 – 2014-07-20 (×3): 240 mg via ORAL
  Filled 2014-07-18 (×3): qty 1

## 2014-07-18 MED ORDER — ONDANSETRON HCL 4 MG/2ML IJ SOLN
4.0000 mg | Freq: Four times a day (QID) | INTRAMUSCULAR | Status: DC | PRN
  Administered 2014-07-18: 4 mg via INTRAVENOUS
  Filled 2014-07-18: qty 2

## 2014-07-18 MED ORDER — ADULT MULTIVITAMIN W/MINERALS CH
1.0000 | ORAL_TABLET | Freq: Every day | ORAL | Status: DC
  Administered 2014-07-18 – 2014-07-20 (×3): 1 via ORAL
  Filled 2014-07-18 (×3): qty 1

## 2014-07-18 NOTE — Consult Note (Signed)
[facility] Consult   Reason for Consult:  Alcohol intoxication and intentional overdose Referring Physician:  Dr. Venetia Maxon / family medicine Matthew [name] is an 58 y.o. male. Total Time spent with patient: 45 minutes  Assessment: AXIS I:  Adjustment Disorder with Depressed Mood, Alcohol Abuse and Substance Induced Mood Disorder AXIS II:  Deferred AXIS III:   Past Medical History  Diagnosis Date  . Atrial fibrillation, chronic   . Hypertension   . GERD (gastroesophageal reflux disease)   . Septic shock   . CHF (congestive heart failure)    AXIS IV:  economic problems, housing problems, other psychosocial or environmental problems, problems related to social environment and problems with primary support group AXIS V:  41-50 serious symptoms  Plan: Case discussed with Dr. Venetia Maxon Recommend no psychotropic medications at this time and monitoring for CIWA protocol while in hospital  No evidence of imminent risk to self or others at present.   Patient does not meet criteria for psychiatric inpatient admission. Supportive therapy provided about ongoing stressors. Discussed crisis plan, support from social network, calling 911, coming to the Emergency Department, and calling Suicide Hotline. Appreciate psychiatric consultation and follow up as clinically required Please contact [phone] if needs further assistance  Subjective:   Matthew [name] is a 58 y.o. male patient admitted with Alcohol intoxication and intentional overdose .  HPI: Patient is seen for face to face psych evaluation and consultation. Patient appeared sitting in a chair next to his bed. He is calm, cooperative with this evaluation. Patient reported being in severe psychosocial stresses, limited support from family and unemployment, motthr passed away about a year ago and 64 years old son is acting out by smoking cannabis and not doing well on his school work and about to loose his place to  live. He states that he does not have suicidal plan or intent but took overdose of medication with intent to get attention to his family. He feels regrets for his overdose and contract for safety and willing to follow up with out patient psychiatric care. He denied chronic alcohol abuse or dependence. He became unemployed due to taking lot of time off after his mother's death and also medical leave for sepsis in the last year. He has plan of sending his son to his mother's home in [place] and staying in local shelter and seeking for employment and disability after medically stable. He has no previous history of psychiatric treatment or suicidal attempts. He has no family history of suicidal attempt or mental illness.  Medical history: 58 year old male with a history of depression, atrial fibrillation on chronic eloquis, CHF, HTN, and GERD presents to the emergency department for further evaluation of ingestion. Patient states that he took 10-12 tablets of Eloquis 5 mg at 2200 yesterday. Patient states that his landlord was going to kick him out of his house after living there for 18 years. He states that he was supposed to be evicted yesterday and felt as though he had no one to turn to. Patient states that he feels abandoned by his family as well as his pastor. He states that he took the medication because he wanted to "have some attention". He denies any desire to kill himself, only to have people recognize him. He states he has too much to live for (referring to his son). Patient also endorses ETOH intake today; states he drank a fifth of tequila. Patient denies HI and illicit drug use. He denies CP, SOB,  abdominal pain, vomiting, hematemesis, melena, and hematochezia. BAL is 255 on arrival.   HPI Elements:    Location:  alcohol abuse. Quality:  poor. Severity:  acuter. Timing:  psychosocial stresses.  Past Psychiatric History: Past Medical History  Diagnosis Date  . Atrial fibrillation, chronic    . Hypertension   . GERD (gastroesophageal reflux disease)   . Septic shock   . CHF (congestive heart failure)     reports that he quit smoking about 39 years ago. His smoking use included Cigarettes. He smoked 0.00 packs per day. He does not have any smokeless tobacco history on file. He reports that he drinks alcohol. His drug history is not on file. History reviewed. No pertinent family history.       Abuse/Neglect [facility]) Physical Abuse: Denies Verbal Abuse: Denies Sexual Abuse: Denies Allergies:   Allergies  Allergen Reactions  . Potassium-Containing Compounds Other (See Comments)    Stomach Pain  . Augmentin [Amoxicillin-Pot Clavulanate] Nausea Only  . Neomycin Rash    ACT Assessment Complete:  NO Objective: Blood pressure 128/66, pulse 93, temperature 98.2 F (36.8 C), temperature source Oral, resp. rate 20, height _0  (1.778 m), weight 163.612 kg (360 lb 11.2 oz), SpO2 97.00%.Body mass index is 51.75 kg/(m^2). Results for orders placed during the hospital encounter of 07/18/14 (from the past 72 hour(s))  CBC WITH DIFFERENTIAL     Status: Abnormal   Collection Time    07/18/14  3:15 AM      Result Value Ref Range   WBC 10.8 (*) 4.0 - 10.5 K/uL   RBC 4.09 (*) 4.22 - 5.81 MIL/uL   Hemoglobin 13.5  13.0 - 17.0 g/dL   HCT 38.2 (*) 39.0 - 52.0 %   MCV 93.4  78.0 - 100.0 fL   MCH 33.0  26.0 - 34.0 pg   MCHC 35.3  30.0 - 36.0 g/dL   RDW 17.8 (*) 11.5 - 15.5 %   Platelets 276  150 - 400 K/uL   Neutrophils Relative % 49  43 - 77 %   Neutro Abs 5.4  1.7 - 7.7 K/uL   Lymphocytes Relative 35  12 - 46 %   Lymphs Abs 3.8  0.7 - 4.0 K/uL   Monocytes Relative 13 (*) 3 - 12 %   Monocytes Absolute 1.4 (*) 0.1 - 1.0 K/uL   Eosinophils Relative 2  0 - 5 %   Eosinophils Absolute 0.2  0.0 - 0.7 K/uL   Basophils Relative 1  0 - 1 %   Basophils Absolute 0.1  0.0 - 0.1 K/uL  COMPREHENSIVE METABOLIC PANEL     Status: Abnormal   Collection Time    07/18/14  3:15 AM      Result  Value Ref Range   Sodium 145  137 - 147 mEq/L   Potassium 3.4 (*) 3.7 - 5.3 mEq/L   Chloride 107  96 - 112 mEq/L   CO2 26  19 - 32 mEq/L   Glucose, Bld 116 (*) 70 - 99 mg/dL   BUN 5 (*) 6 - 23 mg/dL   Creatinine, Ser 0.62  0.50 - 1.35 mg/dL   Calcium 7.6 (*) 8.4 - 10.5 mg/dL   Total Protein 7.8  6.0 - 8.3 g/dL   Albumin 1.9 (*) 3.5 - 5.2 g/dL   AST 54 (*) 0 - 37 U/L   ALT 23  0 - 53 U/L   Alkaline Phosphatase 167 (*) 39 - 117 U/L   Total Bilirubin 0.6  0.3 - 1.2 mg/dL   GFR calc non Af Amer >90  >90 mL/min   GFR calc Af Amer >90  >90 mL/min   Comment: (NOTE)     The eGFR has been calculated using the CKD EPI equation.     This calculation has not been validated in all clinical situations.     eGFR's persistently <90 mL/min signify possible Chronic Kidney     Disease.   Anion gap 12  5 - 15  PROTIME-INR     Status: Abnormal   Collection Time    07/18/14  3:15 AM      Result Value Ref Range   Prothrombin Time 25.3 (*) 11.6 - 15.2 seconds   INR 2.28 (*) 0.00 - 1.49  APTT     Status: Abnormal   Collection Time    07/18/14  3:15 AM      Result Value Ref Range   aPTT 43 (*) 24 - 37 seconds   Comment:            IF BASELINE aPTT IS ELEVATED,     SUGGEST PATIENT RISK ASSESSMENT     BE USED TO DETERMINE APPROPRIATE     ANTICOAGULANT THERAPY.  ETHANOL     Status: Abnormal   Collection Time    07/18/14  3:15 AM      Result Value Ref Range   Alcohol, Ethyl (B) 255 (*) 0 - 11 mg/dL   Comment:            LOWEST DETECTABLE LIMIT FOR     SERUM ALCOHOL IS 11 mg/dL     FOR MEDICAL PURPOSES ONLY  ACETAMINOPHEN LEVEL     Status: None   Collection Time    07/18/14  3:15 AM      Result Value Ref Range   Acetaminophen (Tylenol), Serum <15.0  10 - 30 ug/mL   Comment:            THERAPEUTIC CONCENTRATIONS VARY     SIGNIFICANTLY. A RANGE OF 10-30     ug/mL MAY BE AN EFFECTIVE     CONCENTRATION FOR MANY PATIENTS.     HOWEVER, SOME ARE BEST TREATED     AT CONCENTRATIONS OUTSIDE THIS      RANGE.     ACETAMINOPHEN CONCENTRATIONS     >150 ug/mL AT 4 HOURS AFTER     INGESTION AND >50 ug/mL AT 12     HOURS AFTER INGESTION ARE     OFTEN ASSOCIATED WITH TOXIC     REACTIONS.  SALICYLATE LEVEL     Status: Abnormal   Collection Time    07/18/14  3:15 AM      Result Value Ref Range   Salicylate Lvl <7.5 (*) 2.8 - 20.0 mg/dL  I-STAT TROPOININ, ED     Status: None   Collection Time    07/18/14  3:20 AM      Result Value Ref Range   Troponin i, poc 0.00  0.00 - 0.08 ng/mL   Comment 3            Comment: Due to the release kinetics of cTnI,     a negative result within the first hours     of the onset of symptoms does not rule out     myocardial infarction with certainty.     If myocardial infarction is still suspected,     repeat the test at appropriate intervals.  URINE RAPID DRUG SCREEN (HOSP PERFORMED)  Status: None   Collection Time    07/18/14  3:49 AM      Result Value Ref Range   Opiates NONE DETECTED  NONE DETECTED   Cocaine NONE DETECTED  NONE DETECTED   Benzodiazepines NONE DETECTED  NONE DETECTED   Amphetamines NONE DETECTED  NONE DETECTED   Tetrahydrocannabinol NONE DETECTED  NONE DETECTED   Barbiturates NONE DETECTED  NONE DETECTED   Comment:            DRUG SCREEN FOR MEDICAL PURPOSES     ONLY.  IF CONFIRMATION IS NEEDED     FOR ANY PURPOSE, NOTIFY LAB     WITHIN 5 DAYS.                LOWEST DETECTABLE LIMITS     FOR URINE DRUG SCREEN     Drug Class       Cutoff (ng/mL)     Amphetamine      1000     Barbiturate      200     Benzodiazepine   559     Tricyclics       741     Opiates          300     Cocaine          300     THC              50  POC OCCULT BLOOD, ED     Status: None   Collection Time    07/18/14  4:32 AM      Result Value Ref Range   Fecal Occult Bld NEGATIVE  NEGATIVE  GLUCOSE, CAPILLARY     Status: Abnormal   Collection Time    07/18/14  8:51 AM      Result Value Ref Range   Glucose-Capillary 105 (*) 70 - 99 mg/dL   GLUCOSE, CAPILLARY     Status: Abnormal   Collection Time    07/18/14 11:50 AM      Result Value Ref Range   Glucose-Capillary 133 (*) 70 - 99 mg/dL   Labs are reviewed and are pertinent for alcohol intoxication and elevation of AST.  Current Facility-Administered Medications  Medication Dose Route Frequency Provider Last Rate Last Dose  . benazepril (LOTENSIN) tablet 10 mg  10 mg Oral Daily Forest, MD   10 mg at 07/18/14 6384  . cephALEXin (KEFLEX) capsule 500 mg  500 mg Oral QID Sharon [address], MD   500 mg at 07/18/14 5364  . diltiazem (TIAZAC) 24 hr capsule 240 mg  240 mg Oral Daily Festus, MD   240 mg at 07/18/14 6803  . famotidine (PEPCID) tablet 20 mg  20 mg Oral BID Sharon [address], MD   20 mg at 07/18/14 2122  . folic acid (FOLVITE) tablet 1 mg  1 mg Oral Daily Dunlap, MD   1 mg at 07/18/14 4825  . furosemide (LASIX) tablet 20 mg  20 mg Oral Daily Sharon [address], MD   20 mg at 07/18/14 0037  . insulin aspart (novoLOG) injection 0-15 Units  0-15 Units Subcutaneous TID WC Sharon Mt [address], MD      . LORazepam (ATIVAN) tablet 1 mg  1 mg Oral Q6H PRN Emmaline Kluver, MD       Or  . LORazepam (ATIVAN) injection 1 mg  1 mg Intravenous Q6H PRN Emmaline Kluver, MD      .  multivitamin with minerals tablet 1 tablet  1 tablet Oral Daily Emmaline Kluver, MD   1 tablet at 07/18/14 [id number]  . ondansetron (ZOFRAN) tablet 4 mg  4 mg Oral Q6H PRN Sharon Mt Street, MD       Or  . ondansetron [facility]) injection 4 mg  4 mg Intravenous Q6H PRN Sharon Mt Street, MD      . pantoprazole (PROTONIX) EC tablet 40 mg  40 mg Oral Daily PRN Andrena Mews, MD      . thiamine tablet 100 mg  100 mg Oral Daily Sharon [address] Street, MD   100 mg at 07/18/14 3016    Psychiatric Specialty Exam: Physical Exam as per history and physical  Review of Systems  Neurological: Positive for weakness.  Psychiatric/Behavioral: Positive for  depression and substance abuse. The patient is nervous/anxious.     Blood pressure 128/66, pulse 93, temperature 98.2 F (36.8 C), temperature source Oral, resp. rate 20, height _0  (1.778 m), weight 163.612 kg (360 lb 11.2 oz), SpO2 97.00%.Body mass index is 51.75 kg/(m^2).  General Appearance: Guarded  Eye Contact::  Good  Speech:  Clear and Coherent and Slow  Volume:  Normal  Mood:  Depressed  Affect:  Constricted and Depressed  Thought Process:  Coherent and Goal Directed  Orientation:  Full (Time, Place, and Person)  Thought Content:  WDL  Suicidal Thoughts:  No  Homicidal Thoughts:  No  Memory:  Immediate;   Good Recent;   Good  Judgement:  Intact  Insight:  Fair  Psychomotor Activity:  Decreased  Concentration:  Good  Recall:  Good  Fund of Knowledge:Good  Language: Good  Akathisia:  NA  Handed:  Right  AIMS (if indicated):     Assets:  Communication Skills Desire for Improvement Intimacy Leisure Time Resilience Social Support  Sleep:      Musculoskeletal: Strength & Muscle Tone: decreased Gait & Station: normal Patient leans: N/A  Treatment Plan Summary: Daily contact with patient to assess and evaluate symptoms and progress in treatment Medication management  Refer to out patient psychiatric treatment at [facility] when medically stable   Meia Emley,Matthew R. 07/18/2014 12:25 PM

## 2014-07-18 NOTE — ED Provider Notes (Signed)
Medical screening examination/treatment/procedure(s) were performed by non-physician practitioner and as supervising physician I was immediately available for consultation/collaboration.   EKG Interpretation   Date/Time:  Tuesday July 18 2014 03:02:53 EDT Ventricular Rate:  94 PR Interval:    QRS Duration: 93 QT Interval:  370 QTC Calculation: 463 R Axis:   -63 Text Interpretation:  Atrial fibrillation Inferior infarct, old Anterior  infarct, old Confirmed by [facility]  MD, Kenn Rekowski ([id number]) on 07/18/2014  3:09:42 AM       Matthew Leighton Alfonso Patten, MD 07/18/14 [id number]

## 2014-07-18 NOTE — Progress Notes (Signed)
UR complete.  Geronimo Diliberto RN, MSN 

## 2014-07-18 NOTE — ED Provider Notes (Signed)
CSN: [id number]     Arrival date & time 07/18/14  0240 History   First MD Initiated Contact with Patient 07/18/14 0254     Chief Complaint  Patient presents with  . Drug Overdose    (Consider location/radiation/quality/duration/timing/severity/associated sxs/prior Treatment) HPI Comments: 58 year old male with a history of depression, atrial fibrillation on chronic eloquis, CHF, HTN, and GERD presents to the emergency department for further evaluation of ingestion. Patient states that he took 10-12 tablets of Eloquis 5 mg at 2200 yesterday. Patient states that his landlord was going to kick him out of his house after living there for 18 years. He states that he was supposed to be evicted yesterday and felt as though he had no one to turn to. Patient states that he feels abandoned by his family as well as his pastor. He states that he took the medication because he wanted to "have some attention". He denies any desire to kill himself, only to have people recognize him. He states he has too much to live for (referring to his son). Patient also endorses ETOH intake today; states he drank a fifth of tequila. Patient denies HI and illicit drug use. He denies CP, SOB, abdominal pain, vomiting, hematemesis, melena, and hematochezia.  The history is provided by the patient. No language interpreter was used.    Past Medical History  Diagnosis Date  . Atrial fibrillation, chronic   . Hypertension   . GERD (gastroesophageal reflux disease)   . Septic shock   . CHF (congestive heart failure)    Past Surgical History  Procedure Laterality Date  . Splenectomy    . Tonsilectomy, adenoidectomy, bilateral myringotomy and tubes    . Knee arthroscopy    . Hernia repair     History reviewed. No pertinent family history. History  Substance Use Topics  . Smoking status: Former Smoker    Types: Cigarettes    Quit date: 11/01/1974  . Smokeless tobacco: Not on file     Comment: smoked in high school for  about a year  . Alcohol Use: Yes    Review of Systems  Constitutional: Negative for fever.  Respiratory: Negative for shortness of breath.   Cardiovascular: Negative for chest pain.  Gastrointestinal: Negative for nausea, vomiting, abdominal pain and blood in stool.  Neurological: Negative for syncope.  Psychiatric/Behavioral: Positive for behavioral problems and self-injury.  All other systems reviewed and are negative.   Allergies  Potassium-containing compounds; Augmentin; and Neomycin  Home Medications   Prior to Admission medications   Medication Sig Start Date End Date Taking? Authorizing Provider  benazepril (LOTENSIN) 10 MG tablet Take 1 tablet (10 mg total) by mouth daily. 07/14/14   Waylan Boga, NP  cephALEXin (KEFLEX) 500 MG capsule Take 1 capsule (500 mg total) by mouth every 6 (six) hours. 07/14/14   Waylan Boga, NP  diltiazem (TIAZAC) 240 MG 24 hr capsule Take 1 capsule (240 mg total) by mouth daily. 07/14/14   Waylan Boga, NP  famotidine (PEPCID) 20 MG tablet Take 1 tablet (20 mg total) by mouth 2 (two) times daily. 07/14/14   Waylan Boga, NP  folic acid (FOLVITE) 1 MG tablet Take 1 tablet (1 mg total) by mouth daily. 07/14/14   Waylan Boga, NP  furosemide (LASIX) 20 MG tablet Take 1 tablet (20 mg total) by mouth daily. 07/14/14   Waylan Boga, NP  ibuprofen (ADVIL,MOTRIN) 200 MG tablet Take 400 mg by mouth every 6 (six) hours as needed for moderate pain.  Historical Provider, MD  Multiple Vitamin (MULTIVITAMIN WITH MINERALS) TABS tablet Take 1 tablet by mouth daily. 07/14/14   Waylan Boga, NP  omeprazole (PRILOSEC OTC) 20 MG tablet Take 20 mg by mouth daily as needed (for heart burn).    Historical Provider, MD  thiamine 100 MG tablet Take 1 tablet (100 mg total) by mouth daily. 07/14/14   Waylan Boga, NP   BP 137/82  Pulse 114  Temp(Src) 98 F (36.7 C) (Oral)  Resp 19  SpO2 93%  Physical Exam  Nursing note and vitals reviewed. Constitutional: He is  oriented to person, place, and time. He appears well-developed and well-nourished. No distress.  Nontoxic/nonseptic appearing. Patient smells of alcohol.  HENT:  Head: Normocephalic and atraumatic.  Eyes: Conjunctivae and EOM are normal. No scleral icterus.  Neck: Normal range of motion.  Cardiovascular: Intact distal pulses.  An irregularly irregular rhythm present.  90-115 HR; irregularly irregular rhythm  Pulmonary/Chest: Effort normal and breath sounds normal. No respiratory distress.  Chest expansion symmetric.  Abdominal: There is no tenderness. There is no guarding.  Super morbidly obese male with a nontender abdomen. Large, low lying pannus. Exam limited secondary to body habitus.  Musculoskeletal: Normal range of motion.  Neurological: He is alert and oriented to person, place, and time. He exhibits normal muscle tone. Coordination normal.  Skin: Skin is warm and dry. No rash noted. He is not diaphoretic. No erythema. No pallor.  Psychiatric: His speech is normal. He expresses impulsivity and inappropriate judgment. He exhibits a depressed mood. He expresses no homicidal and no suicidal ideation. He expresses no suicidal plans and no homicidal plans.     ED Course  Procedures (including critical care time)  Labs Review Labs Reviewed  CBC WITH DIFFERENTIAL - Abnormal; Notable for the following:    WBC 10.8 (*)    RBC 4.09 (*)    HCT 38.2 (*)    RDW 17.8 (*)    Monocytes Relative 13 (*)    Monocytes Absolute 1.4 (*)    All other components within normal limits  COMPREHENSIVE METABOLIC PANEL - Abnormal; Notable for the following:    Potassium 3.4 (*)    Glucose, Bld 116 (*)    BUN 5 (*)    Calcium 7.6 (*)    Albumin 1.9 (*)    AST 54 (*)    Alkaline Phosphatase 167 (*)    All other components within normal limits  PROTIME-INR - Abnormal; Notable for the following:    Prothrombin Time 25.3 (*)    INR 2.28 (*)    All other components within normal limits  APTT -  Abnormal; Notable for the following:    aPTT 43 (*)    All other components within normal limits  ETHANOL - Abnormal; Notable for the following:    Alcohol, Ethyl (B) 255 (*)    All other components within normal limits  SALICYLATE LEVEL - Abnormal; Notable for the following:    Salicylate Lvl <1.5 (*)    All other components within normal limits  URINE RAPID DRUG SCREEN (HOSP PERFORMED)  ACETAMINOPHEN LEVEL  OCCULT BLOOD X 1 CARD TO LAB, STOOL  I-STAT TROPOININ, ED  POC OCCULT BLOOD, ED   Imaging Review No results found.   EKG Interpretation   Date/Time:  Tuesday July 18 2014 03:02:53 EDT Ventricular Rate:  94 PR Interval:    QRS Duration: 93 QT Interval:  370 QTC Calculation: 463 R Axis:   -63 Text Interpretation:  Atrial fibrillation  Inferior infarct, old Anterior  infarct, old Confirmed by [facility]  MD, APRIL ([id number]) on 07/18/2014  3:09:42 AM      MDM   Final diagnoses:  Drug ingestion, intentional self-harm, initial encounter  Alcohol intoxication, uncomplicated  Atrial fibrillation, unspecified    0300 - Patient arrives to the ED after ingestion of 10 tablets of 5mg  Eliquis at 2200 yesterday. Poison control notified. Half life 5-9 hours. Patient now 5 hours out from ingestion. Shanon Brow of [facility] recommends checking clotting times as well as repeat PT/INR in 4 hours. No indication for charcoal. Will obtain psych screening labs and continue to monitor patient.  0445 - Patient with ethanol level of 255. No evidence of acute blood loss. H/H and BP stable. Hemoccult negative. BUN c/w baseline. Patient does have an INR of 2.28; however, this would be considered therapeutic given his hx of A fib. Patient case discussed with FM resident. Plan includes admission until patient is out of the window of 4 times the 1/2 life of Eloquis (half life - 12 hours). Will transfer patient to [facility] for continued care by PCP. EMTALA completed for transfer. VSS. He will likely require  inpatient psychiatric consult.   Filed Vitals:   07/18/14 0249 07/18/14 0426  BP: 134/65 137/82  Pulse: 101 114  Temp: 98 F (36.7 C)   TempSrc: Oral   Resp: 18 19  SpO2: 96% 93%      Antonietta Breach, PA-C 07/18/14 0505

## 2014-07-18 NOTE — ED Notes (Signed)
Attempted to call report was told it has just been paged and nurse needed time to be notified and to try again in 10 minutes.

## 2014-07-18 NOTE — ED Notes (Signed)
Brought in by EMS from home with c/o drug overdose.  Pt reported that he took 10 tablets of expired Eliquis 5 mg "to get attention"--- pt denies SI or HI.  Pt states, "I was not trying to kill or harm myself,  I just took the pills to get attention.  If I want to kill myself,  I should have taken them all".  Pt presents to ED A/Ox4, in no s/s apparent distress.  Per pt, he is going to be "evicted" from his house tomorrow and he has "nowhere to go"; pt reports that he has a son who lives in [place] but his son could not take him in.   Pt also c/o bilateral leg pain--- noted redness and edema to both legs.  Pt further reports that he has ran out of his important medications like Lasix, Keflex and Diltiazem.

## 2014-07-18 NOTE — H&P (Signed)
[facility] Admission History and Physical Service Pager: [phone]  Patient name: Matthew [name] Medical record number: [id number] Date of birth: Sep 16, 1956 Age: 58 y.o. Gender: male  Primary Care Provider: Lorna Few, DO Consultants: psychiatry Code Status: FULL  Chief Complaint: intentional overdose (Eliquis)  Assessment and Plan: Matthew [name] is a 58 y.o. male presenting with presenting with deliberate overdose of anticoagulant medication and acute alcohol intoxication. Also noted to have RLE cellulitis, without systemic symptoms or evidence for local abscess. PMH is significant for morbid obesity, HTN, GERD, and chronic afib. Pt with a very poor current social / financial situation.  #Deliberate Eliquis overdose - per pt, NOT a suicide attempt but a gesture "for attention" and denies SI / HI / AH / VH - INR 2.28 without active signs of bleeding, and other than EtOH level of 255 about 5 hours ago, tox testing all negative - FOBT negative and no report of GI bleeding (blood in emesis or stool) - ED providers contacted poison control, who suggested admission at least until 4 half-lives of Eliquis (t1/2 = 12 hours) - ordered for repeat INR and CMP later this afternoon - hemoccult all stools - psych consult requested; appreciate assistance / recommendations - also ordered for CIWA protocol (see below)  #RLE cellulitis - RLE appearance consistent with cellulitis superimposed on chronic edema / venous stasis - WBC mildly elevated but no active bleeding / drainage of crusted lesion - no failure of outpt PO abx, as pt never picked up Rx for Keflex - will treat with Keflex PO while here, and monitor clinically -- expand to IV abx if worsening  #EtOH abuse / acute intoxication - pt denies EtOH is a problem, but has had a history of abuse in the past - pt with EtOH level 255 on admission and with ED visit complicated by acute intoxication a few days  ago, as well - EtOH likely played a role in deliberate overdose, as above - ordered for CIWA for now, with daily thiamine and folic acid - CSW consult also requested re: poor social situation / financial situation  #Chronic afib - no active chest pain, SOB; deliberate overdose of Eliquis, as above - monitor clinically and on telemetry - hold anticoagulants for now; need to determine outpt regimen prior to discharge (?resume Eliquis)  #HTN - BP mildly elevated, intermittently; pt reports noncompliance with meds at home - ordered for home meds - benazepril 10 mg daily, Lasix 20 mg daily  #Diabetes type 2 - not on any meds at home, last A1c <6 - repeat A1c, ordered for SSI coverage with qAC+HS CBG checks, for now  #GERD - no active heartburn complaints; continue home PPI (Protonix sub for omeprazole)  FEN/GI: saline lock IV, carb-modified diet Prophylaxis: SCD's  Disposition: admit to telemetry bed with management as above, attending Dr. Gwendlyn Deutscher (day attending Dr. Wendy Poet) - anticipate discharge in ~48 hours (approx 4 half-lives of Eliquis) - discharge planning pending further work-up / recommendations from consultants  History of Present Illness: Matthew [name] is a 58 y.o. male presenting with deliberate overdose of anticoagulant medication. PMH is significant for morbid obesity, HTN, GERD, and chronic afib. He states he has significant financial and social stress at home (possibly facing eviction, feels like his family has abandoned him; pt was at the ED 4 days ago, acutely intoxicated, after arguing with his son and having the police called -- he made a comment about "I guess I will kill  myself" and was brought to the ED, though there he denied active suicidality and psychiatry providers felt he was safe to f/u as an outpt). Since that time, he has continued to have significant stress at home. He reports he drank a fifth of tequila yesterday and took 10-12 Eliquis 5 mg tablets around 10  PM last night "to get attention." Pt adamantly denies he was deliberately trying to kill himself; he states, "I thought it was like warfarin, that it was reversible." He states, "I was stupid and drank too much tequila." He denies alcoholism is a problem, but he has apparently had EtOH abuse in the past. He denies frank bleeding, vomiting blood, or blood in his stool.  Of note, pt was also diagnosed with a LE cellulitis of the right calf during the ED visit 4 days ago noted above and was prescribed Keflex, but he never picked this Rx up as he could not afford the $20 co-pay. He states he has a crusted lesion on the back of his calf that has had some intermittent drainage and pain, but it has not been bleeding or getting more painful, and he denies systemic symptoms (see below).  Otherwise, he generally feels well. He denies fever, chills, N/V, abdominal pain, cough, SOB, coryza-type symptoms, or chest pain. He denies SI / HI, AH / VH. He reports he hasn't taken his medications regularly "for months."  Review Of Systems: Per HPI. Otherwise 12 point review of systems was performed and was unremarkable.  Patient Active Problem List   Diagnosis Date Noted  . Drug ingestion 07/18/2014  . Adjustment disorder with disturbance of emotion 07/14/2014  . Alcohol abuse 07/14/2014  . Pannus, abdominal 02/23/2014  . 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 Poor social situation 11/11/2013  . Chronic alcohol abuse 11/03/2013  . Sepsis 10/31/2013  . Cellulitis and abscess of trunk 10/31/2013  . A-fib 03/25/2012  . DM type 2 (diabetes mellitus, type 2) 03/25/2012  . Obesity 03/25/2012  . Sleep apnea 03/25/2012  . Fatty liver 03/25/2012  . HTN (hypertension) 11/02/2011   Past Medical History: Past Medical History  Diagnosis Date  . Atrial fibrillation, chronic   . Hypertension   . GERD (gastroesophageal reflux disease)   . Septic shock   . CHF (congestive heart failure)    Past Surgical  History: Past Surgical History  Procedure Laterality Date  . Splenectomy    . Tonsilectomy, adenoidectomy, bilateral myringotomy and tubes    . Knee arthroscopy    . Hernia repair     Social History: History  Substance Use Topics  . Smoking status: Former Smoker    Types: Cigarettes    Quit date: 11/01/1974  . Smokeless tobacco: Not on file     Comment: smoked in high school for about a year  . Alcohol Use: Yes   Additional social history: see HPI Please also refer to relevant sections of EMR.  Family History: History reviewed. No pertinent family history. Allergies and Medications: Allergies  Allergen Reactions  . Potassium-Containing Compounds Other (See Comments)    Stomach Pain  . Augmentin [Amoxicillin-Pot Clavulanate] Nausea Only  . Neomycin Rash   No current facility-administered medications on file prior to encounter.   Current Outpatient Prescriptions on File Prior to Encounter  Medication Sig Dispense Refill  . benazepril (LOTENSIN) 10 MG tablet Take 1 tablet (10 mg total) by mouth daily.  30 tablet  1  . cephALEXin (KEFLEX) 500 MG capsule Take 1 capsule (500 mg total) by mouth  every 6 (six) hours.  28 capsule  0  . diltiazem (TIAZAC) 240 MG 24 hr capsule Take 1 capsule (240 mg total) by mouth daily.  30 capsule  0  . famotidine (PEPCID) 20 MG tablet Take 1 tablet (20 mg total) by mouth 2 (two) times daily.  30 tablet  0  . folic acid (FOLVITE) 1 MG tablet Take 1 tablet (1 mg total) by mouth daily.      . furosemide (LASIX) 20 MG tablet Take 1 tablet (20 mg total) by mouth daily.  30 tablet  1  . ibuprofen (ADVIL,MOTRIN) 200 MG tablet Take 400 mg by mouth every 6 (six) hours as needed for moderate pain.      . Multiple Vitamin (MULTIVITAMIN WITH MINERALS) TABS tablet Take 1 tablet by mouth daily.  30 tablet  0  . omeprazole (PRILOSEC OTC) 20 MG tablet Take 20 mg by mouth daily as needed (for heart burn).      . thiamine 100 MG tablet Take 1 tablet (100 mg total)  by mouth daily.  30 tablet  0    Objective: BP 135/70  Pulse 83  Temp(Src) 98.2 F (36.8 C) (Oral)  Resp 18  Ht 5\' 10"  (1.778 m)  Wt 360 lb 11.2 oz (163.612 kg)  BMI 51.75 kg/m2  SpO2 98% Exam: General: adult male in NAD, pleasant and cooperative HEENT: [place]/AT, EOMI, MMM Cardiovascular: irregularly irregular rhythm, no murmur appreciated Respiratory: CTAB, normal effort, no wheezes Abdomen: morbidly obese with large pannus, limiting exam; no frank tenderness, BS+ Extremities: warm, well-perfused; bilateral LE with chronic nonpitting edema and chronic skin changes  Right calf with dime-sized crusted lesion posteriorly and red induration around it  Difficult to appreciate discrete border to induration due to chronic skin changes  Right calf otherwise nontender, no cords palpated, and left LE with edema but no other lesions Skin: right leg lesion as above, otherwise intact Neuro: alert, oriented, nonfocal exam otherwise  Labs and Imaging: CBC   Recent Labs Lab 07/18/14 0315  WBC 10.8*  HGB 13.5  HCT 38.2*  PLT 276      Recent Labs Lab 07/18/14 0315  NA 145  K 3.4*  CL 107  CO2 26  GLUCOSE 116*  BUN 5*  CREATININE 0.62  CALCIUM 7.6*  ALKPHOS 167*  AST 54*  ALT 23  ALBUMIN 1.9*   POC troponin negative UDS - all negative Blood alcohol level 255 (H) FOBT - negative  EKG 10/20 @0302  - irregularly irregular rhythm without identifiable P-waves  - Q-waves inferiorly with poor R-wave progression  - essentially unchanged from previous tracings  Emmaline Kluver, MD 07/18/2014, 8:17 AM PGY-3, [facility] Intern pager: [phone], text pages welcome

## 2014-07-18 NOTE — ED Notes (Signed)
Bed: WA09 Expected date:  Expected time:  Means of arrival:  Comments: Overdose/etoh

## 2014-07-18 NOTE — H&P (Signed)
[facility] ATTENDING ADMISSION NOTE Matthew Heitmeyer,MD I  have seen and examined this patient, reviewed their chart. I have discussed this patient with the resident. I agree with the resident's findings, assessment and care plan.  58 Y/O M with PMX of HTN,GERD, Afib, Obesity was transferred from [facility] for Eliquis OD, patient stated he was fed up with all the issues he has been having since Jan. He has been treated for sepsis twice this year, his mother died 07/19/2023 last year, he is about to lose his house, uncertain where he will live in the next few weeks. Patient denies any hx of depression, anxiety or psychosis, he felt what he did was stupid and sorry for doing it. He currently denies any suicidal ideation. He denies any bleeding, no dizziness, feels well now.  No current facility-administered medications on file prior to encounter.   Current Outpatient Prescriptions on File Prior to Encounter  Medication Sig Dispense Refill  . ibuprofen (ADVIL,MOTRIN) 200 MG tablet Take 200-400 mg by mouth every 6 (six) hours as needed for fever, headache or moderate pain.       [name] omeprazole (PRILOSEC OTC) 20 MG tablet Take 20 mg by mouth daily as needed (for heart burn).       Past Medical History  Diagnosis Date  . Atrial fibrillation, chronic   . Hypertension   . GERD (gastroesophageal reflux disease)   . Septic shock   . CHF (congestive heart failure)    Filed Vitals:   07/18/14 0533 07/18/14 0629 07/18/14 0924 07/18/14 1125  BP: 122/68 135/70 139/79 128/66  Pulse: 83 83 78 93  Temp:  98.2 F (36.8 C)  98.2 F (36.8 C)  TempSrc:  Oral  Oral  Resp: 17 18  20   Height:  5\' 10"  (1.778 m)    Weight:  360 lb 11.2 oz (163.612 kg)    SpO2: 92% 98%  97%   Exam: UDJ:SHFWY. Awake and alert, not in distress. NEURO: Oriented x 3, no facial deformity, no slurring of speech, no limb or motor weakness,no sensory loss. Gait not assessed. HEENT: EOMI, PERRLA. Resp: Air entry equal and clear B/L CV: S1 S2 normal, no  murmur. Abd: Soft, obese, NT, BS+ and normal. Ext: B/L mildly pitting edema from stasis with slight erythema.  A/P: 58 Y/O M with 1. Overdose on Eliquis. Po[facility] control was [facility] at [facility].     Not bleeding. INR 2.28.     Stool guiaiac negative.     Patient appears clinically and hemodynamically stable for now.     Monitor INR and CBC.     Hold all anticoagulant for now.  2. ETOH abuse:     Alcohol level on admission was 225.     Place on CIWA protocol and monitor for withdrawal.     Liver enzyme slightly elevated with AST higher than ALT.     Monitor for now and counsel on alcohol cessation.  3. Afib: Hold anticoagulant for now.  4. HTN/DM2: Monitor vital signs.    Monitor CBG and start SSI.    Review home regimen and restart as appropriate.

## 2014-07-19 LAB — BASIC METABOLIC PANEL
Anion gap: 13 (ref 5–15)
BUN: 6 mg/dL (ref 6–23)
CHLORIDE: 99 meq/L (ref 96–112)
CO2: 27 mEq/L (ref 19–32)
CREATININE: 0.69 mg/dL (ref 0.50–1.35)
Calcium: 8.1 mg/dL — ABNORMAL LOW (ref 8.4–10.5)
GFR calc Af Amer: >90 mL/min (ref >90)
GFR calc non Af Amer: >90 mL/min (ref >90)
Glucose, Bld: 163 mg/dL — ABNORMAL HIGH (ref 70–99)
Potassium: 4.1 mEq/L (ref 3.7–5.3)
Sodium: 139 mEq/L (ref 137–147)

## 2014-07-19 LAB — CBC
HCT: 42.5 % (ref 39.0–52.0)
HEMOGLOBIN: 14.7 g/dL (ref 13.0–17.0)
MCH: 33.3 pg (ref 26.0–34.0)
MCHC: 34.6 g/dL (ref 30.0–36.0)
MCV: 96.4 fL (ref 78.0–100.0)
Platelets: 283 10*3/uL (ref 150–400)
RBC: 4.41 MIL/uL (ref 4.22–5.81)
RDW: 17.6 % — ABNORMAL HIGH (ref 11.5–15.5)
WBC: 10.4 10*3/uL (ref 4.0–10.5)

## 2014-07-19 LAB — GLUCOSE, CAPILLARY
GLUCOSE-CAPILLARY: 151 mg/dL — AB (ref 70–99)
GLUCOSE-CAPILLARY: 93 mg/dL (ref 70–99)
Glucose-Capillary: 105 mg/dL — ABNORMAL HIGH (ref 70–99)
Glucose-Capillary: 160 mg/dL — ABNORMAL HIGH (ref 70–99)

## 2014-07-19 LAB — PROTIME-INR
INR: 1.75 — AB (ref 0.00–1.49)
Prothrombin Time: 20.6 seconds — ABNORMAL HIGH (ref 11.6–15.2)

## 2014-07-19 MED ORDER — POTASSIUM CHLORIDE CRYS ER 20 MEQ PO TBCR
40.0000 meq | EXTENDED_RELEASE_TABLET | Freq: Once | ORAL | Status: AC
  Administered 2014-07-19: 40 meq via ORAL
  Filled 2014-07-19: qty 2

## 2014-07-19 NOTE — Clinical Social Work Psych Note (Signed)
Psych CSW received consult for outpatient follow-up for pt. Pt currently does not have medical insurance to cover follow-up.  Pt may follow-up with [facility].  The initial evaluation is walk-in only Monday-Thursday 8am-3:30pm.  Beverly Sessions will assess pt for psychiatric needs and begin following.  Pt is aware.  This information has also been placed on pt's dc summary as an appointment for follow-up.  Psych CSW signing off.  Nonnie Done, [place] [phone]  Psychiatric & Orthopedics (5N 1-16) Clinical Social Worker

## 2014-07-19 NOTE — Consult Note (Signed)
[facility] Face-to-Face Psychiatry Consult Follow up note  Reason for Consult:  Alcohol intoxication and intentional overdose Referring Physician:  Dr. Venetia Maxon / family medicine Matthew [name] is an 58 y.o. male. Total Time spent with patient: 30 minutes  Assessment: AXIS I:  Adjustment Disorder with Depressed Mood, Alcohol Abuse and Substance Induced Mood Disorder AXIS II:  Deferred AXIS III:   Past Medical History  Diagnosis Date  . Atrial fibrillation, chronic   . Hypertension   . GERD (gastroesophageal reflux disease)   . Septic shock   . CHF (congestive heart failure)    AXIS IV:  economic problems, housing problems, other psychosocial or environmental problems, problems related to social environment and problems with primary support group AXIS V:  41-50 serious symptoms  Plan: Case discussed with Dr. Venetia Maxon Recommend no psychotropic medications at this time  Monitoring for CIWA protocol while in hospital  No evidence of imminent risk to self or others at present.   Patient does not meet criteria for psychiatric inpatient admission. Supportive therapy provided about ongoing stressors. Discussed crisis plan, support from social network, calling 911, coming to the Emergency Department, and calling Suicide Hotline. Appreciate psychiatric consultation and follow up as clinically required Please contact [phone] if needs further assistance  Subjective:   Matthew [name] is a 58 y.o. male patient admitted with Alcohol intoxication and intentional overdose .  HPI: Patient is seen for face to face psych evaluation and consultation. Patient appeared sitting in a chair next to his bed. He is calm, cooperative with this evaluation. Patient reported being in severe psychosocial stresses, limited support from family and unemployment, motthr passed away about a year ago and 16 years old son is acting out by smoking cannabis and not doing well on his school work and about to loose his  place to live. He states that he does not have suicidal plan or intent but took overdose of medication with intent to get attention to his family. He feels regrets for his overdose and contract for safety and willing to follow up with out patient psychiatric care. He denied chronic alcohol abuse or dependence. He became unemployed due to taking lot of time off after his mother's death and also medical leave for sepsis in the last year. He has plan of sending his son to his mother's home in [place] and staying in local shelter and seeking for employment and disability after medically stable. He has no previous history of psychiatric treatment or suicidal attempts. He has no family history of suicidal attempt or mental illness.  Medical history: 58 year old male with a history of depression, atrial fibrillation on chronic eloquis, CHF, HTN, and GERD presents to the emergency department for further evaluation of ingestion. Patient states that he took 10-12 tablets of Eloquis 5 mg at 2200 yesterday. Patient states that his landlord was going to kick him out of his house after living there for 18 years. He states that he was supposed to be evicted yesterday and felt as though he had no one to turn to. Patient states that he feels abandoned by his family as well as his pastor. He states that he took the medication because he wanted to "have some attention". He denies any desire to kill himself, only to have people recognize him. He states he has too much to live for (referring to his son). Patient also endorses ETOH intake today; states he drank a fifth of tequila. Patient denies HI and illicit drug use. He denies  CP, SOB, abdominal pain, vomiting, hematemesis, melena, and hematochezia. BAL is 255 on arrival.   Interval History: Patient is seen for psych consult. Patient has been compliant with his current medication treatment and has no reported adverse effects. He has been in contact with his son and also has plan  to call his landlord and stated he still can go there after discharge. He has denied current symptoms of depression, suicidal ideation, intention and plans. He is contracting for safety and willing to follow up out patient care. He has no alcohol withdrawal symptoms. He has fair sleep and appetite. He is still monitoring for effects of medication (eloquis) overdosed as per poison control. He is hoping to be discharged tomorrow.    Objective: Blood pressure 121/58, pulse 84, temperature 97.8 F (36.6 C), temperature source Oral, resp. rate 18, height '5\' 10"'  (1.778 m), weight 162.977 kg (359 lb 4.8 oz), SpO2 98.00%.Body mass index is 51.55 kg/(m^2). Results for orders placed during the hospital encounter of 07/18/14 (from the past 72 hour(s))  CBC WITH DIFFERENTIAL     Status: Abnormal   Collection Time    07/18/14  3:15 AM      Result Value Ref Range   WBC 10.8 (*) 4.0 - 10.5 K/uL   RBC 4.09 (*) 4.22 - 5.81 MIL/uL   Hemoglobin 13.5  13.0 - 17.0 g/dL   HCT 38.2 (*) 39.0 - 52.0 %   MCV 93.4  78.0 - 100.0 fL   MCH 33.0  26.0 - 34.0 pg   MCHC 35.3  30.0 - 36.0 g/dL   RDW 17.8 (*) 11.5 - 15.5 %   Platelets 276  150 - 400 K/uL   Neutrophils Relative % 49  43 - 77 %   Neutro Abs 5.4  1.7 - 7.7 K/uL   Lymphocytes Relative 35  12 - 46 %   Lymphs Abs 3.8  0.7 - 4.0 K/uL   Monocytes Relative 13 (*) 3 - 12 %   Monocytes Absolute 1.4 (*) 0.1 - 1.0 K/uL   Eosinophils Relative 2  0 - 5 %   Eosinophils Absolute 0.2  0.0 - 0.7 K/uL   Basophils Relative 1  0 - 1 %   Basophils Absolute 0.1  0.0 - 0.1 K/uL  COMPREHENSIVE METABOLIC PANEL     Status: Abnormal   Collection Time    07/18/14  3:15 AM      Result Value Ref Range   Sodium 145  137 - 147 mEq/L   Potassium 3.4 (*) 3.7 - 5.3 mEq/L   Chloride 107  96 - 112 mEq/L   CO2 26  19 - 32 mEq/L   Glucose, Bld 116 (*) 70 - 99 mg/dL   BUN 5 (*) 6 - 23 mg/dL   Creatinine, Ser 0.62  0.50 - 1.35 mg/dL   Calcium 7.6 (*) 8.4 - 10.5 mg/dL   Total Protein  7.8  6.0 - 8.3 g/dL   Albumin 1.9 (*) 3.5 - 5.2 g/dL   AST 54 (*) 0 - 37 U/L   ALT 23  0 - 53 U/L   Alkaline Phosphatase 167 (*) 39 - 117 U/L   Total Bilirubin 0.6  0.3 - 1.2 mg/dL   GFR calc non Af Amer >90  >90 mL/min   GFR calc Af Amer >90  >90 mL/min   Comment: (NOTE)     The eGFR has been calculated using the CKD EPI equation.     This calculation has not been validated  in all clinical situations.     eGFR's persistently <90 mL/min signify possible Chronic Kidney     Disease.   Anion gap 12  5 - 15  PROTIME-INR     Status: Abnormal   Collection Time    07/18/14  3:15 AM      Result Value Ref Range   Prothrombin Time 25.3 (*) 11.6 - 15.2 seconds   INR 2.28 (*) 0.00 - 1.49  APTT     Status: Abnormal   Collection Time    07/18/14  3:15 AM      Result Value Ref Range   aPTT 43 (*) 24 - 37 seconds   Comment:            IF BASELINE aPTT IS ELEVATED,     SUGGEST PATIENT RISK ASSESSMENT     BE USED TO DETERMINE APPROPRIATE     ANTICOAGULANT THERAPY.  ETHANOL     Status: Abnormal   Collection Time    07/18/14  3:15 AM      Result Value Ref Range   Alcohol, Ethyl (B) 255 (*) 0 - 11 mg/dL   Comment:            LOWEST DETECTABLE LIMIT FOR     SERUM ALCOHOL IS 11 mg/dL     FOR MEDICAL PURPOSES ONLY  ACETAMINOPHEN LEVEL     Status: None   Collection Time    07/18/14  3:15 AM      Result Value Ref Range   Acetaminophen (Tylenol), Serum <15.0  10 - 30 ug/mL   Comment:            THERAPEUTIC CONCENTRATIONS VARY     SIGNIFICANTLY. A RANGE OF 10-30     ug/mL MAY BE AN EFFECTIVE     CONCENTRATION FOR MANY PATIENTS.     HOWEVER, SOME ARE BEST TREATED     AT CONCENTRATIONS OUTSIDE THIS     RANGE.     ACETAMINOPHEN CONCENTRATIONS     >150 ug/mL AT 4 HOURS AFTER     INGESTION AND >50 ug/mL AT 12     HOURS AFTER INGESTION ARE     OFTEN ASSOCIATED WITH TOXIC     REACTIONS.  SALICYLATE LEVEL     Status: Abnormal   Collection Time    07/18/14  3:15 AM      Result Value Ref Range    Salicylate Lvl <7.8 (*) 2.8 - 20.0 mg/dL  I-STAT TROPOININ, ED     Status: None   Collection Time    07/18/14  3:20 AM      Result Value Ref Range   Troponin i, poc 0.00  0.00 - 0.08 ng/mL   Comment 3            Comment: Due to the release kinetics of cTnI,     a negative result within the first hours     of the onset of symptoms does not rule out     myocardial infarction with certainty.     If myocardial infarction is still suspected,     repeat the test at appropriate intervals.  URINE RAPID DRUG SCREEN (HOSP PERFORMED)     Status: None   Collection Time    07/18/14  3:49 AM      Result Value Ref Range   Opiates NONE DETECTED  NONE DETECTED   Cocaine NONE DETECTED  NONE DETECTED   Benzodiazepines NONE DETECTED  NONE DETECTED   Amphetamines NONE DETECTED  NONE DETECTED   Tetrahydrocannabinol NONE DETECTED  NONE DETECTED   Barbiturates NONE DETECTED  NONE DETECTED   Comment:            DRUG SCREEN FOR MEDICAL PURPOSES     ONLY.  IF CONFIRMATION IS NEEDED     FOR ANY PURPOSE, NOTIFY LAB     WITHIN 5 DAYS.                LOWEST DETECTABLE LIMITS     FOR URINE DRUG SCREEN     Drug Class       Cutoff (ng/mL)     Amphetamine      1000     Barbiturate      200     Benzodiazepine   494     Tricyclics       496     Opiates          300     Cocaine          300     THC              50  POC OCCULT BLOOD, ED     Status: None   Collection Time    07/18/14  4:32 AM      Result Value Ref Range   Fecal Occult Bld NEGATIVE  NEGATIVE  GLUCOSE, CAPILLARY     Status: Abnormal   Collection Time    07/18/14  8:51 AM      Result Value Ref Range   Glucose-Capillary 105 (*) 70 - 99 mg/dL  HEMOGLOBIN A1C     Status: None   Collection Time    07/18/14  9:15 AM      Result Value Ref Range   Hemoglobin A1C 5.6  <5.7 %   Comment: (NOTE)                                                                               According to the ADA Clinical Practice Recommendations for 2011, when      HbA1c is used as a screening test:      >=6.5%   Diagnostic of Diabetes Mellitus               (if abnormal result is confirmed)     5.7-6.4%   Increased risk of developing Diabetes Mellitus     References:Diagnosis and Classification of Diabetes Mellitus,Diabetes     PRFF,6384,66(ZLDJT 1):S62-S69 and Standards of Medical Care in             Diabetes - 2011,Diabetes Care,2011,34 (Suppl 1):S11-S61.   Mean Plasma Glucose 114  <117 mg/dL   Comment: Performed at [place], CAPILLARY     Status: Abnormal   Collection Time    07/18/14 11:50 AM      Result Value Ref Range   Glucose-Capillary 133 (*) 70 - 99 mg/dL  PROTIME-INR     Status: Abnormal   Collection Time    07/18/14  2:48 PM      Result Value Ref Range   Prothrombin Time 24.3 (*) 11.6 - 15.2 seconds   INR 2.16 (*) 0.00 - 1.49  COMPREHENSIVE METABOLIC PANEL  Status: Abnormal   Collection Time    07/18/14  2:48 PM      Result Value Ref Range   Sodium 139  137 - 147 mEq/L   Potassium 3.1 (*) 3.7 - 5.3 mEq/L   Chloride 102  96 - 112 mEq/L   CO2 27  19 - 32 mEq/L   Glucose, Bld 163 (*) 70 - 99 mg/dL   BUN 5 (*) 6 - 23 mg/dL   Creatinine, Ser 0.62  0.50 - 1.35 mg/dL   Calcium 7.9 (*) 8.4 - 10.5 mg/dL   Total Protein 7.8  6.0 - 8.3 g/dL   Albumin 1.9 (*) 3.5 - 5.2 g/dL   AST 55 (*) 0 - 37 U/L   ALT 23  0 - 53 U/L   Alkaline Phosphatase 170 (*) 39 - 117 U/L   Total Bilirubin 0.7  0.3 - 1.2 mg/dL   GFR calc non Af Amer >90  >90 mL/min   GFR calc Af Amer >90  >90 mL/min   Comment: (NOTE)     The eGFR has been calculated using the CKD EPI equation.     This calculation has not been validated in all clinical situations.     eGFR's persistently <90 mL/min signify possible Chronic Kidney     Disease.   Anion gap 10  5 - 15  CBC     Status: Abnormal   Collection Time    07/18/14  2:48 PM      Result Value Ref Range   WBC 8.7  4.0 - 10.5 K/uL   RBC 4.04 (*) 4.22 - 5.81 MIL/uL   Hemoglobin 13.4  13.0 -  17.0 g/dL   HCT 37.5 (*) 39.0 - 52.0 %   MCV 92.8  78.0 - 100.0 fL   MCH 33.2  26.0 - 34.0 pg   MCHC 35.7  30.0 - 36.0 g/dL   RDW 17.8 (*) 11.5 - 15.5 %   Platelets 281  150 - 400 K/uL  GLUCOSE, CAPILLARY     Status: Abnormal   Collection Time    07/18/14  4:48 PM      Result Value Ref Range   Glucose-Capillary 134 (*) 70 - 99 mg/dL  GLUCOSE, CAPILLARY     Status: Abnormal   Collection Time    07/18/14  9:50 PM      Result Value Ref Range   Glucose-Capillary 114 (*) 70 - 99 mg/dL   Comment 1 Documented in Chart     Comment 2 Notify RN    GLUCOSE, CAPILLARY     Status: Abnormal   Collection Time    07/19/14  6:22 AM      Result Value Ref Range   Glucose-Capillary 105 (*) 70 - 99 mg/dL   Comment 1 Documented in Chart     Comment 2 Notify RN    CBC     Status: Abnormal   Collection Time    07/19/14  9:10 AM      Result Value Ref Range   WBC 10.4  4.0 - 10.5 K/uL   RBC 4.41  4.22 - 5.81 MIL/uL   Hemoglobin 14.7  13.0 - 17.0 g/dL   HCT 42.5  39.0 - 52.0 %   MCV 96.4  78.0 - 100.0 fL   MCH 33.3  26.0 - 34.0 pg   MCHC 34.6  30.0 - 36.0 g/dL   RDW 17.6 (*) 11.5 - 15.5 %   Platelets 283  150 - 400 K/uL  PROTIME-INR     Status: Abnormal   Collection Time    07/19/14  9:10 AM      Result Value Ref Range   Prothrombin Time 20.6 (*) 11.6 - 15.2 seconds   INR 1.75 (*) 0.00 - 6.59  BASIC METABOLIC PANEL     Status: Abnormal   Collection Time    07/19/14  9:30 AM      Result Value Ref Range   Sodium 139  137 - 147 mEq/L   Potassium 4.1  3.7 - 5.3 mEq/L   Comment: HEMOLYSIS AT THIS LEVEL MAY AFFECT RESULT   Chloride 99  96 - 112 mEq/L   CO2 27  19 - 32 mEq/L   Glucose, Bld 163 (*) 70 - 99 mg/dL   BUN 6  6 - 23 mg/dL   Creatinine, Ser 0.69  0.50 - 1.35 mg/dL   Calcium 8.1 (*) 8.4 - 10.5 mg/dL   GFR calc non Af Amer >90  >90 mL/min   GFR calc Af Amer >90  >90 mL/min   Comment: (NOTE)     The eGFR has been calculated using the CKD EPI equation.     This calculation has not  been validated in all clinical situations.     eGFR's persistently <90 mL/min signify possible Chronic Kidney     Disease.   Anion gap 13  5 - 15  GLUCOSE, CAPILLARY     Status: Abnormal   Collection Time    07/19/14 11:31 AM      Result Value Ref Range   Glucose-Capillary 151 (*) 70 - 99 mg/dL   Labs are reviewed and are pertinent for alcohol intoxication and elevation of AST.  Current Facility-Administered Medications  Medication Dose Route Frequency Provider Last Rate Last Dose  . benazepril (LOTENSIN) tablet 5 mg  5 mg Oral Daily Hilton Sinclair, MD   5 mg at 07/19/14 1048  . cephALEXin (KEFLEX) capsule 500 mg  500 mg Oral QID [place], MD   500 mg at 07/19/14 1418  . diltiazem (TIAZAC) 24 hr capsule 240 mg  240 mg Oral Daily [place], MD   240 mg at 07/19/14 1048  . famotidine (PEPCID) tablet 20 mg  20 mg Oral BID [place], MD   20 mg at 07/19/14 1048  . folic acid (FOLVITE) tablet 1 mg  1 mg Oral Daily [place], MD   1 mg at 07/19/14 1048  . furosemide (LASIX) tablet 20 mg  20 mg Oral Daily [address], MD   20 mg at 07/19/14 1048  . insulin aspart (novoLOG) injection 0-15 Units  0-15 Units Subcutaneous TID WC Emmaline Kluver, MD   3 Units at 07/19/14 1232  . LORazepam (ATIVAN) tablet 1 mg  1 mg Oral Q6H PRN Emmaline Kluver, MD       Or  . LORazepam (ATIVAN) injection 1 mg  1 mg Intravenous Q6H PRN [place], MD      . multivitamin with minerals tablet 1 tablet  1 tablet Oral Daily [address], MD   1 tablet at 07/19/14 1048  . ondansetron (ZOFRAN) tablet 4 mg  4 mg Oral Q6H PRN [place], MD       Or  . ondansetron [facility]) injection 4 mg  4 mg Intravenous Q6H PRN [address], MD   4 mg at 07/18/14 1945  . pantoprazole (PROTONIX) EC tablet 40 mg  40 mg Oral  Daily PRN Andrena Mews, MD      . thiamine tablet 100 mg  100 mg Oral Daily [place], MD   100 mg at  07/19/14 1047    Psychiatric Specialty Exam: Physical Exam as per history and physical  Review of Systems  Neurological: Positive for weakness.  Psychiatric/Behavioral: Positive for depression and substance abuse. The patient is nervous/anxious.     Blood pressure 121/58, pulse 84, temperature 97.8 F (36.6 C), temperature source Oral, resp. rate 18, height '5\' 10"'  (1.778 m), weight 162.977 kg (359 lb 4.8 oz), SpO2 98.00%.Body mass index is 51.55 kg/(m^2).  General Appearance: Guarded  Eye Contact::  Good  Speech:  Clear and Coherent and Slow  Volume:  Normal  Mood:  Depressed  Affect:  Constricted and Depressed  Thought Process:  Coherent and Goal Directed  Orientation:  Full (Time, Place, and Person)  Thought Content:  WDL  Suicidal Thoughts:  No  Homicidal Thoughts:  No  Memory:  Immediate;   Good Recent;   Good  Judgement:  Intact  Insight:  Fair  Psychomotor Activity:  Decreased  Concentration:  Good  Recall:  Good  Fund of Knowledge:Good  Language: Good  Akathisia:  NA  Handed:  Right  AIMS (if indicated):     Assets:  Communication Skills Desire for Improvement Intimacy Leisure Time Resilience Social Support  Sleep:      Musculoskeletal: Strength & Muscle Tone: decreased Gait & Station: normal Patient leans: N/A  Treatment Plan Summary: Daily contact with patient to assess and evaluate symptoms and progress in treatment Medication management  Refer to out patient psychiatric treatment at [facility] when medically stable   Freida Nebel,JANARDHAHA R. 07/19/2014 2:22 PM

## 2014-07-19 NOTE — Care Management Note (Signed)
    Page 1 of 1   07/19/2014     3:29:33 PM CARE MANAGEMENT NOTE 07/19/2014  Patient:  Matthew [name], Matthew [name]   Account Number:  [id number]  Date Initiated:  07/18/2014  Documentation initiated by:  Lorne Skeens  Subjective/Objective Assessment:   Patient was admitted with an intentional Eliquis overdose, ETOH intoxication.  Lives at home with spouse.     Action/Plan:   Will follow for discharge needs.   Anticipated DC Date:  07/19/2014   Anticipated DC Plan:  [place]  CM consult      Choice offered to / List presented to:             Status of service:  Completed, signed off Medicare Important Message given?   (If response is "NO", the following Medicare IM given date fields will be blank) Date Medicare IM given:   Medicare IM given by:   Date Additional Medicare IM given:   Additional Medicare IM given by:    Discharge Disposition:  HOME/SELF CARE  Per UR Regulation:  Reviewed for med. necessity/level of care/duration of stay  If discussed at [place] of Stay Meetings, dates discussed:    Comments:  07/19/14 Ellan Lambert, RN, BSN [phone] Psych CSW made aware of need for OP follow up with [facility], per psychiatrist's recommendations.

## 2014-07-19 NOTE — Progress Notes (Signed)
07/19/14 1500  Clinical Encounter Type  Visited With Patient;Health care provider  Visit Type Initial;Spiritual support  Referral From Physician  Spiritual Encounters  Spiritual Needs Emotional  Stress Factors  Patient Stress Factors Financial concerns;Major life changes   Chaplain was referred to patient via spiritual care consult. Patient explained that he is experiencing great financial stress. Patient is unemployed and has been evicted from from his home. Patient expressed frustration from being rejected from quite a few job opportunities. Patient is also sad because his teenage son is going to live with a relative until he can get financially stable. Patient's biggest frustration is lack of support. Patient explained that he has had minimal family support during his struggle and currently only has a faith community that may help him out for a month. Patient explicitly stated he has to much live for at this time even though he is struggling. Chaplain will continue to provide emotional and spiritual support for patient as needed. Gar Ponto, Chaplain 4:03 PM

## 2014-07-19 NOTE — Progress Notes (Signed)
I discussed with  Dr McKeag.  I agree with their plans documented in their progress note.  
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
## 2014-07-20 DIAGNOSIS — I480 Paroxysmal atrial fibrillation: Secondary | ICD-10-CM

## 2014-07-20 LAB — GLUCOSE, CAPILLARY
GLUCOSE-CAPILLARY: 118 mg/dL — AB (ref 70–99)
GLUCOSE-CAPILLARY: 131 mg/dL — AB (ref 70–99)

## 2014-07-20 MED ORDER — CEPHALEXIN 500 MG PO CAPS: 500.0000 mg | ORAL_CAPSULE | Freq: Four times a day (QID) | ORAL | Status: DC

## 2014-07-20 MED ORDER — FAMOTIDINE 20 MG PO TABS: 20.0000 mg | ORAL_TABLET | Freq: Two times a day (BID) | ORAL | Status: DC

## 2014-07-20 MED ORDER — THIAMINE HCL 100 MG PO TABS: 100.0000 mg | ORAL_TABLET | Freq: Every day | ORAL | Status: DC

## 2014-07-20 MED ORDER — ACETAMINOPHEN 325 MG PO TABS
650.0000 mg | ORAL_TABLET | Freq: Four times a day (QID) | ORAL | Status: DC | PRN
  Administered 2014-07-20: 650 mg via ORAL
  Filled 2014-07-20: qty 2

## 2014-07-20 MED ORDER — FOLIC ACID 1 MG PO TABS: 1.0000 mg | ORAL_TABLET | Freq: Every day | ORAL | Status: DC

## 2014-07-20 MED ORDER — FUROSEMIDE 20 MG PO TABS: 20.0000 mg | ORAL_TABLET | Freq: Every day | ORAL | Status: DC

## 2014-07-20 MED ORDER — DILTIAZEM HCL ER BEADS 240 MG PO CP24: 240.0000 mg | ORAL_CAPSULE | Freq: Every day | ORAL | Status: DC

## 2014-07-20 MED ORDER — ADULT MULTIVITAMIN W/MINERALS CH: 1.0000 | ORAL_TABLET | Freq: Every day | ORAL | Status: DC

## 2014-07-20 MED ORDER — BENAZEPRIL HCL 10 MG PO TABS: 10.0000 mg | ORAL_TABLET | Freq: Every day | ORAL | Status: DC

## 2014-07-20 NOTE — Progress Notes (Signed)
Discharge instruction and medication given to pt  Taxi voucher also given by case management for a cap transport home. Pt refused insulin coverage. Pt sent down via w/c for taxi ride home. Condition at discharge is stable. Pt denied any suicidal ideation.

## 2014-07-20 NOTE — Progress Notes (Signed)
Visited Mr. Matthew [name] as an inpatient.  Was very talkative and excited to be going home tomorrow (10/22) and seeing his son.  I appreciate the inpatient team for providing care for Mr. Bonelli during his hospitalization.  Will continue to follow his care.  Dr. Junie Panning, PGY-1

## 2014-07-20 NOTE — Discharge Instructions (Signed)

## 2014-07-20 NOTE — Progress Notes (Signed)
Eliquis was not on his discharge medication.

## 2014-07-20 NOTE — Progress Notes (Signed)
I discussed with  Dr McKeag.  I agree with their plans documented in their progress note.  

## 2014-07-20 NOTE — Progress Notes (Signed)
RN noted pt left his home medication Eliquis on the table . A call made to pt . However pt stated e intentionally left it and that he does not want it. And his Md stated will change it to  coumadin .
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
## 2014-07-20 NOTE — Progress Notes (Signed)
Provided pt with taxi voucher transportation home.

## 2014-07-21 LAB — GLUCOSE, CAPILLARY: GLUCOSE-CAPILLARY: 150 mg/dL — AB (ref 70–99)

## 2014-07-21 NOTE — Discharge Summary (Signed)
[facility] Discharge Summary  Patient name: Matthew [name] Medical record number: [id number] Date of birth: Nov 26, 1955 Age: 58 y.o. Gender: male Date of Admission: 07/18/2014  Date of Discharge: 07/20/14 Admitting Physician: Andrena Mews, MD  Primary Care Provider: Lorna Few, DO Consultants: psych, poison control  Indication for Hospitalization:  Intentional overdose of prescribed medication; Eliquis  Discharge Diagnoses/Problem List:  Intentional overdose; Eliquis  Disposition: Home  Discharge Condition: Stable  Discharge Exam:  General: adult male in NAD, pleasant and cooperative  Cardiovascular: slightly irregular rhythm, distant sounds 2/2 to body habitus, no murmur appreciated  Respiratory: CTAB, normal effort, no wheezes  Abdomen: morbidly obese with large pannus, limiting exam; no frank tenderness, BS+  Extremities: warm, well-perfused; bilateral LE with chronic nonpitting edema and chronic skin changes  Right calf with dime-sized crusted lesion posteriorly and red induration around it  Difficult to appreciate discrete border to induration due to chronic skin changes  Right calf otherwise nontender, no cords palpated, and left LE with edema but no other lesions  Skin: right leg lesion as above, otherwise intact; no signs of bleeding/ecchymosis  Neuro: alert, oriented, nonfocal exam otherwise; denied SI/HI   Brief Hospital Course:  Patient is a 58 year old male who was brought to the hospital by EMS after her self reporting his own delivered overdose of home medication, Eliquis. Patient states that he is been under significant financial and social stress at home. He had recently found out that he would be evicted from his house. He states that after finding out about his addiction he went to the store bought a bottle of tequila and began drinking. He states that eventually he got the idea to take more than his prescribed medication.  He says that he immediately called EMS to inform them that he taken 10-12 tablets of Eliquis 5 mg. Patient states that he does not actively want to die. He believes that he did what he did for the attention. Patient also states that he "was stupid and drank too much tequila".  Upon admission poison control was consulted. They recommended observing him for 4 times half-life of his medication. This was equivalent to 48 hours. Psychiatry was also consulted. Psychiatry saw patient. They deemed patient stable for discharge after his 48 hour observation period. They found that he was at no risk to himself or others, and that his current condition warranted outpatient followup only.  Throughout patient's stay he experienced no complications of his anticoagulant overdose. He experienced no ecchymoses, hematochezia, melena, hematuria, hematemesis, hemoptysis, or destabilization of his vitals.  Patient presented with a left lower leg cellulitis. Patient was treated with Keflex. And was discharged with a prescription to finish out his treatment regimen. Patient was deemed medically stable after 48 hours of observation and was discharged.   Issues for Follow Up:  - This was the second episode of suicidal intent over a one-week period. Patient was cleared by psychiatry. He will need to followup with them as an outpatient. Patient was provided with the necessary instructions to do so. - Significant psychosocial obstacles. Patient is currently unemployed, he is recently been evicted from his house, and his current health is sub par.  Significant Procedures: None  Significant Labs and Imaging:   Recent Labs Lab 07/18/14 0315 07/18/14 1448 07/19/14 0910  WBC 10.8* 8.7 10.4  HGB 13.5 13.4 14.7  HCT 38.2* 37.5* 42.5  PLT 276 281 283    Recent Labs Lab 07/18/14 0315 07/18/14 1448 07/19/14 0930  NA 145 139 139  K 3.4* 3.1* 4.1  CL 107 102 99  CO2 26 27 27   GLUCOSE 116* 163* 163*  BUN 5* 5* 6   CREATININE 0.62 0.62 0.69  CALCIUM 7.6* 7.9* 8.1*  ALKPHOS 167* 170*  --   AST 54* 55*  --   ALT 23 23  --   ALBUMIN 1.9* 1.9*  --     10/21 INR 1.75 (downtrending) 10/21 prothrombin time 20.6 (downtrending)  Results/Tests Pending at Time of Discharge: None  Discharge Medications:    Medication List         benazepril 10 MG tablet  Commonly known as:  LOTENSIN  Take 1 tablet (10 mg total) by mouth daily.     cephALEXin 500 MG capsule  Commonly known as:  KEFLEX  Take 1 capsule (500 mg total) by mouth 4 (four) times daily.     diltiazem 240 MG 24 hr capsule  Commonly known as:  TIAZAC  Take 1 capsule (240 mg total) by mouth daily.     famotidine 20 MG tablet  Commonly known as:  PEPCID  Take 1 tablet (20 mg total) by mouth 2 (two) times daily.     folic acid 1 MG tablet  Commonly known as:  FOLVITE  Take 1 tablet (1 mg total) by mouth daily.     furosemide 20 MG tablet  Commonly known as:  LASIX  Take 1 tablet (20 mg total) by mouth daily.     ibuprofen 200 MG tablet  Commonly known as:  ADVIL,MOTRIN  Take 200-400 mg by mouth every 6 (six) hours as needed for fever, headache or moderate pain.     multivitamin with minerals Tabs tablet  Take 1 tablet by mouth daily.     omeprazole 20 MG tablet  Commonly known as:  PRILOSEC OTC  Take 20 mg by mouth daily as needed (for heart burn).     thiamine 100 MG tablet  Take 1 tablet (100 mg total) by mouth daily.        Discharge Instructions: Please refer to Patient Instructions section of EMR for full details.  Patient was counseled important signs and symptoms that should prompt return to medical care, changes in medications, dietary instructions, activity restrictions, and follow up appointments.   Follow-Up Appointments: Follow-up Information   Follow up with [facility] In 1 day. (follow up psychiatric assessment, M-Th 8am-3:30pm walk-in only)    Specialty:  [facility] information:   [place]  [id number] [phone]       Follow up with Thersa Salt, DO On 08/01/2014. (@ 2pm)    Specialty:  Family Medicine   Contact information:   [place] [address] [phone]       Elberta Leatherwood, MD 07/21/2014, 9:04 PM PGY-1, [place]

## 2014-07-22 NOTE — Discharge Summary (Signed)
I discussed with  Dr McKeag.  I agree with their plans documented in their discharge note.   

## 2014-07-28 ENCOUNTER — Encounter (HOSPITAL_COMMUNITY): Payer: Self-pay | Admitting: Emergency Medicine

## 2014-07-28 ENCOUNTER — Emergency Department (HOSPITAL_COMMUNITY)
Admission: EM | Admit: 2014-07-28 | Discharge: 2014-07-28 | Disposition: A | Discharge status: Home / Self Care | Payer: Managed Care, Other (non HMO) | Attending: Emergency Medicine | Admitting: Emergency Medicine

## 2014-07-28 DIAGNOSIS — I10 Essential (primary) hypertension: Secondary | ICD-10-CM | POA: Insufficient documentation

## 2014-07-28 DIAGNOSIS — K219 Gastro-esophageal reflux disease without esophagitis: Secondary | ICD-10-CM | POA: Insufficient documentation

## 2014-07-28 DIAGNOSIS — I509 Heart failure, unspecified: Secondary | ICD-10-CM | POA: Insufficient documentation

## 2014-07-28 DIAGNOSIS — M79609 Pain in unspecified limb: Secondary | ICD-10-CM

## 2014-07-28 DIAGNOSIS — I482 Chronic atrial fibrillation: Secondary | ICD-10-CM | POA: Insufficient documentation

## 2014-07-28 DIAGNOSIS — Z79899 Other long term (current) drug therapy: Secondary | ICD-10-CM | POA: Insufficient documentation

## 2014-07-28 DIAGNOSIS — Z792 Long term (current) use of antibiotics: Secondary | ICD-10-CM | POA: Insufficient documentation

## 2014-07-28 DIAGNOSIS — R2241 Localized swelling, mass and lump, right lower limb: Secondary | ICD-10-CM

## 2014-07-28 DIAGNOSIS — E669 Obesity, unspecified: Secondary | ICD-10-CM

## 2014-07-28 DIAGNOSIS — Z87891 Personal history of nicotine dependence: Secondary | ICD-10-CM | POA: Insufficient documentation

## 2014-07-28 DIAGNOSIS — L03115 Cellulitis of right lower limb: Secondary | ICD-10-CM | POA: Insufficient documentation

## 2014-07-28 LAB — CBC
HCT: 43.8 % (ref 39.0–52.0)
Hemoglobin: 15.2 g/dL (ref 13.0–17.0)
MCH: 33 pg (ref 26.0–34.0)
MCHC: 34.7 g/dL (ref 30.0–36.0)
MCV: 95.2 fL (ref 78.0–100.0)
Platelets: 305 10*3/uL (ref 150–400)
RBC: 4.6 MIL/uL (ref 4.22–5.81)
RDW: 17.2 % — ABNORMAL HIGH (ref 11.5–15.5)
WBC: 11 10*3/uL — ABNORMAL HIGH (ref 4.0–10.5)

## 2014-07-28 LAB — BASIC METABOLIC PANEL
Anion gap: 8 (ref 5–15)
BUN: 6 mg/dL (ref 6–23)
CO2: 29 mEq/L (ref 19–32)
Calcium: 8.5 mg/dL (ref 8.4–10.5)
Chloride: 100 mEq/L (ref 96–112)
Creatinine, Ser: 0.67 mg/dL (ref 0.50–1.35)
GFR calc Af Amer: >90 mL/min (ref >90)
GFR calc non Af Amer: >90 mL/min (ref >90)
Glucose, Bld: 116 mg/dL — ABNORMAL HIGH (ref 70–99)
Potassium: 3.5 mEq/L — ABNORMAL LOW (ref 3.7–5.3)
Sodium: 137 mEq/L (ref 137–147)

## 2014-07-28 MED ORDER — SULFAMETHOXAZOLE-TMP DS 800-160 MG PO TABS
ORAL_TABLET | ORAL | Status: AC
  Filled 2014-07-28: qty 1

## 2014-07-28 MED ORDER — SULFAMETHOXAZOLE-TMP DS 800-160 MG PO TABS
1.0000 | ORAL_TABLET | Freq: Once | ORAL | Status: AC
  Administered 2014-07-28: 1 via ORAL

## 2014-07-28 MED ORDER — SULFAMETHOXAZOLE-TRIMETHOPRIM 800-160 MG PO TABS: 1.0000 | ORAL_TABLET | Freq: Two times a day (BID) | ORAL | Status: AC

## 2014-07-28 NOTE — Progress Notes (Signed)
Right lower extremity venous duplex completed:  No obvious evidence of DVT, superficial thrombosis, or Baker's cyst.  Technically difficult study due to the patient's body habitus.  Bilateral common femoral vein, right saphenofemoral junction, and right profunda vein not visualized.

## 2014-07-28 NOTE — ED Notes (Addendum)
Pt reports right lower leg pain 4 days ago. Pt reports noticed "knot" appearing yesterday. Pt denies pain at present time but reports "when it does hurt it is a 5 or 6." Pt denies numbness to extremity.

## 2014-07-28 NOTE — ED Provider Notes (Signed)
CSN: [id number]     Arrival date & time 07/28/14  1111 History   First MD Initiated Contact with Patient 07/28/14 1201     Chief Complaint  Patient presents with  . Leg Pain     (Consider location/radiation/quality/duration/timing/severity/associated sxs/prior Treatment) HPI Pt is a 58yo male with hx of afib, HTN, GERD, CHF and leg edema presenting to ED with c/o a "knot" on the anterior aspect of right lower leg that appeared yesterday. Pt reports 2-3 day hx of mild intermittent pain to same area prior to mass presenting.  Denies trauma to the leg or insect bites.  Pain was dull and aching with intermittent episodes of sharp "twinges" that only lasted a few seconds, 6/10 at worst. Denies fever, n/v/d. States he is currently on Keflex, has 3 tabs left, was prescribed this when he was seen at [facility] for behavioral health reasons.  Pt does state his legs are chronically erythematous but also reports hx of septic shock earlier this year. Pt states he is not on blood thinners due to cost of medication.  Pt is concerned he has a blood clot in his leg. He states other than the "knot" on his leg, he "feels great"   Past Medical History  Diagnosis Date  . Atrial fibrillation, chronic   . Hypertension   . GERD (gastroesophageal reflux disease)   . Septic shock   . CHF (congestive heart failure)    Past Surgical History  Procedure Laterality Date  . Splenectomy    . Tonsilectomy, adenoidectomy, bilateral myringotomy and tubes    . Knee arthroscopy    . Hernia repair     History reviewed. No pertinent family history. History  Substance Use Topics  . Smoking status: Former Smoker    Types: Cigarettes    Quit date: 11/01/1974  . Smokeless tobacco: Not on file     Comment: smoked in high school for about a year  . Alcohol Use: Yes    Review of Systems  Constitutional: Negative for fever and chills.  Cardiovascular: Positive for leg swelling ( chronic). Negative for chest pain and  palpitations.  Gastrointestinal: Negative for nausea, vomiting and diarrhea.  Musculoskeletal: Negative for gait problem and myalgias.  Skin: Positive for wound. Negative for color change and rash.       "knot" on front of right lower leg  All other systems reviewed and are negative.     Allergies  Potassium-containing compounds; Augmentin; and Neomycin  Home Medications   Prior to Admission medications   Medication Sig Start Date End Date Taking? Authorizing Provider  cephALEXin (KEFLEX) 500 MG capsule Take 1 capsule (500 mg total) by mouth 4 (four) times daily. 07/20/14  Yes Elberta Leatherwood, MD  diltiazem (TIAZAC) 240 MG 24 hr capsule Take 1 capsule (240 mg total) by mouth daily. 07/20/14  Yes Elberta Leatherwood, MD  furosemide (LASIX) 20 MG tablet Take 1 tablet (20 mg total) by mouth daily. 07/20/14  Yes Elberta Leatherwood, MD  ibuprofen (ADVIL,MOTRIN) 200 MG tablet Take 200-400 mg by mouth every 6 (six) hours as needed for fever, headache or moderate pain.    Yes Historical Provider, MD  Multiple Vitamin (MULTIVITAMIN WITH MINERALS) TABS tablet Take 1 tablet by mouth daily. 07/20/14  Yes Elberta Leatherwood, MD  thiamine 100 MG tablet Take 1 tablet (100 mg total) by mouth daily. 07/20/14  Yes Elberta Leatherwood, MD  benazepril (LOTENSIN) 10 MG tablet Take 1 tablet (10 mg total) by  mouth daily. 07/20/14   Elberta Leatherwood, MD  famotidine (PEPCID) 20 MG tablet Take 1 tablet (20 mg total) by mouth 2 (two) times daily. 07/20/14   Elberta Leatherwood, MD  folic acid (FOLVITE) 1 MG tablet Take 1 tablet (1 mg total) by mouth daily. 07/20/14   Elberta Leatherwood, MD  omeprazole (PRILOSEC OTC) 20 MG tablet Take 20 mg by mouth daily as needed (for heart burn).    Historical Provider, MD  sulfamethoxazole-trimethoprim (BACTRIM DS,SEPTRA DS) 800-160 MG per tablet Take 1 tablet by mouth 2 (two) times daily. 07/28/14 08/04/14  Noland Fordyce, PA-C   BP 170/77  Pulse 91  Temp(Src) 98.4 F (36.9 C) (Oral)  Resp 18  SpO2 97% Physical Exam   Nursing note and vitals reviewed. Constitutional: He appears well-developed and well-nourished.  HENT:  Head: Normocephalic and atraumatic.  Eyes: Conjunctivae are normal. No scleral icterus.  Neck: Normal range of motion.  Cardiovascular: Normal rate, regular rhythm and normal heart sounds.   Pulmonary/Chest: Effort normal and breath sounds normal. No respiratory distress. He has no wheezes. He has no rales. He exhibits no tenderness.  Abdominal: Soft. Bowel sounds are normal. He exhibits no distension and no mass. There is no tenderness. There is no rebound and no guarding.  Musculoskeletal: Normal range of motion. He exhibits edema and tenderness.  Bilateral lower leg edema and erythema. No calf tenderness. See skin exam.   Neurological: He is alert.  Skin: Skin is warm and dry. There is erythema.  Bilateral lower leg erythema and edema with warmth. Right lower leg, anterior aspect- 2-3cm rounded lesion, mildly tender to touch, fluctuant.  No induration.  No discharge or bleeding. C/w blood blister.    ED Course  Procedures (including critical care time) Labs Review Labs Reviewed  CBC - Abnormal; Notable for the following:    WBC 11.0 (*)    RDW 17.2 (*)    All other components within normal limits  BASIC METABOLIC PANEL - Abnormal; Notable for the following:    Potassium 3.5 (*)    Glucose, Bld 116 (*)    All other components within normal limits    Imaging Review No results found.   EKG Interpretation None      MDM   Final diagnoses:  Mass of right lower leg  Cellulitis of right lower leg   Pt with hx of bilateral leg edema and erythema c/o "knot" to anterior right lower leg. Denies trauma. No calf tenderness, however, hx of afib.     Pt appears well, non-toxic. Lower extremities concerning for cellulitis. Venous doppler- negative for DVT. Lesion is more c/w a blood blister than an abscess.  Discussed pt with Dr. Canary Brim who also examined pt.  Will discharge pt  home on bactrim. Home care instructions provided as well as ace wrap to help pt with home RICE tx. Advised to f/u in 3-4 days for recheck if not improving. Return precautions provided. Pt verbalized understanding and agreement with tx plan.    Noland Fordyce, PA-C 07/28/14 [phone]

## 2014-07-28 NOTE — ED Notes (Signed)
Per pt, 3-4 days ago started having right leg pain.  States knot formed on shin.  Painful to touch.  No recent travel.  Hx of a-fib.  Pt took self off of blood thinners.  No injury noted.

## 2014-07-28 NOTE — Discharge Instructions (Signed)
Abscess °An abscess (boil or furuncle) is an infected area on or under the skin. This area is filled with yellowish-white fluid (pus) and other material (debris). °HOME CARE  °· Only take medicines as told by your doctor. °· If you were given antibiotic medicine, take it as directed. Finish the medicine even if you start to feel better. °· If gauze is used, follow your doctor's directions for changing the gauze. °· To avoid spreading the infection: °¨ Keep your abscess covered with a bandage. °¨ Wash your hands well. °¨ Do not share personal care items, towels, or whirlpools with others. °¨ Avoid skin contact with others. °· Keep your skin and clothes clean around the abscess. °· Keep all doctor visits as told. °GET HELP RIGHT AWAY IF:  °· You have more pain, puffiness (swelling), or redness in the wound site. °· You have more fluid or blood coming from the wound site. °· You have muscle aches, chills, or you feel sick. °· You have a fever. °MAKE SURE YOU:  °· Understand these instructions. °· Will watch your condition. °· Will get help right away if you are not doing well or get worse. °Document Released: 03/03/2008 Document Revised: 03/16/2012 Document Reviewed: 11/28/2011 °[employer]® Patient Information ©2015 [employer]. This information is not intended to replace advice given to you by your health care provider. Make sure you discuss any questions you have with your health care provider. ° °

## 2014-07-29 NOTE — ED Provider Notes (Signed)
Medical screening examination/treatment/procedure(s) were conducted as a shared visit with non-physician practitioner(s) and myself.  I personally evaluated the patient during the encounter.   EKG Interpretation None     Pt presenting with c/o swollen area of leg- he has chronic erythema of anterior tibial region which is unchanged.  approx 2cm taught fluid filled nodule has appeared.  No overlying warmth, doubt abscess, appears to be filled with serosanguinous material.  Pt has been taking keflex for unclear skin infection- will change to bactrim to cover in case of MRSA.  Pt is overall nontoxic appearing.  Discharged with strict return precautions.  Pt agreeable with plan.  Threasa Beards, MD 07/29/14 [phone]

## 2014-08-01 ENCOUNTER — Inpatient Hospital Stay: Payer: Self-pay | Admitting: Family Medicine

## 2014-09-25 ENCOUNTER — Encounter (HOSPITAL_COMMUNITY): Payer: Self-pay | Admitting: *Deleted

## 2014-09-25 ENCOUNTER — Emergency Department (HOSPITAL_COMMUNITY)
Admission: EM | Admit: 2014-09-25 | Discharge: 2014-09-25 | Disposition: A | Discharge status: Home / Self Care | Payer: Managed Care, Other (non HMO) | Attending: Emergency Medicine | Admitting: Emergency Medicine

## 2014-09-25 DIAGNOSIS — Z8619 Personal history of other infectious and parasitic diseases: Secondary | ICD-10-CM | POA: Insufficient documentation

## 2014-09-25 DIAGNOSIS — N390 Urinary tract infection, site not specified: Secondary | ICD-10-CM | POA: Insufficient documentation

## 2014-09-25 DIAGNOSIS — Z79899 Other long term (current) drug therapy: Secondary | ICD-10-CM | POA: Insufficient documentation

## 2014-09-25 DIAGNOSIS — I10 Essential (primary) hypertension: Secondary | ICD-10-CM | POA: Insufficient documentation

## 2014-09-25 DIAGNOSIS — K219 Gastro-esophageal reflux disease without esophagitis: Secondary | ICD-10-CM | POA: Insufficient documentation

## 2014-09-25 DIAGNOSIS — I509 Heart failure, unspecified: Secondary | ICD-10-CM | POA: Insufficient documentation

## 2014-09-25 DIAGNOSIS — K429 Umbilical hernia without obstruction or gangrene: Secondary | ICD-10-CM | POA: Insufficient documentation

## 2014-09-25 DIAGNOSIS — I482 Chronic atrial fibrillation: Secondary | ICD-10-CM | POA: Insufficient documentation

## 2014-09-25 DIAGNOSIS — Z87891 Personal history of nicotine dependence: Secondary | ICD-10-CM | POA: Insufficient documentation

## 2014-09-25 DIAGNOSIS — Z792 Long term (current) use of antibiotics: Secondary | ICD-10-CM | POA: Insufficient documentation

## 2014-09-25 LAB — CBC
HEMATOCRIT: 43 % (ref 39.0–52.0)
Hemoglobin: 14.5 g/dL (ref 13.0–17.0)
MCH: 33.1 pg (ref 26.0–34.0)
MCHC: 33.7 g/dL (ref 30.0–36.0)
MCV: 98.2 fL (ref 78.0–100.0)
PLATELETS: 466 10*3/uL — AB (ref 150–400)
RBC: 4.38 MIL/uL (ref 4.22–5.81)
RDW: 16.1 % — AB (ref 11.5–15.5)
WBC: 13.8 10*3/uL — AB (ref 4.0–10.5)

## 2014-09-25 LAB — COMPREHENSIVE METABOLIC PANEL
ALBUMIN: 2.1 g/dL — AB (ref 3.5–5.2)
ALT: 17 U/L (ref 0–53)
ANION GAP: 5 (ref 5–15)
AST: 40 U/L — ABNORMAL HIGH (ref 0–37)
Alkaline Phosphatase: 132 U/L — ABNORMAL HIGH (ref 39–117)
BILIRUBIN TOTAL: 1.7 mg/dL — AB (ref 0.3–1.2)
BUN: 6 mg/dL (ref 6–23)
CO2: 30 mmol/L (ref 19–32)
CREATININE: 0.59 mg/dL (ref 0.50–1.35)
Calcium: 7.9 mg/dL — ABNORMAL LOW (ref 8.4–10.5)
Chloride: 103 mEq/L (ref 96–112)
GFR calc Af Amer: >90 mL/min (ref >90)
GFR calc non Af Amer: >90 mL/min (ref >90)
Glucose, Bld: 139 mg/dL — ABNORMAL HIGH (ref 70–99)
Potassium: 3.4 mmol/L — ABNORMAL LOW (ref 3.5–5.1)
Sodium: 138 mmol/L (ref 135–145)
Total Protein: 6.9 g/dL (ref 6.0–8.3)

## 2014-09-25 LAB — URINE MICROSCOPIC-ADD ON

## 2014-09-25 LAB — URINALYSIS, ROUTINE W REFLEX MICROSCOPIC
Bilirubin Urine: NEGATIVE
GLUCOSE, UA: NEGATIVE mg/dL
KETONES UR: NEGATIVE mg/dL
Nitrite: POSITIVE — AB
PROTEIN: NEGATIVE mg/dL
Specific Gravity, Urine: 1.008 (ref 1.005–1.030)
UROBILINOGEN UA: 1 mg/dL (ref 0.0–1.0)
pH: 7 (ref 5.0–8.0)

## 2014-09-25 MED ORDER — CEPHALEXIN 500 MG PO CAPS: 500.0000 mg | ORAL_CAPSULE | Freq: Four times a day (QID) | ORAL | Status: DC

## 2014-09-25 MED ORDER — POTASSIUM CHLORIDE CRYS ER 20 MEQ PO TBCR
20.0000 meq | EXTENDED_RELEASE_TABLET | Freq: Once | ORAL | Status: AC
  Administered 2014-09-25: 20 meq via ORAL
  Filled 2014-09-25: qty 1

## 2014-09-25 MED ORDER — SODIUM CHLORIDE 0.9 % IV SOLN
INTRAVENOUS | Status: DC
  Administered 2014-09-25: 13:00:00 via INTRAVENOUS

## 2014-09-25 MED ORDER — DEXTROSE 5 % IV SOLN
1.0000 g | Freq: Once | INTRAVENOUS | Status: AC
  Administered 2014-09-25: 1 g via INTRAVENOUS
  Filled 2014-09-25: qty 10

## 2014-09-25 NOTE — ED Notes (Signed)
Pt reports urinary frequency x2 weeks. Dysuria x2 days. Pain 7/10

## 2014-09-25 NOTE — Discharge Instructions (Signed)
It was our pleasure to provide your ER care today - we hope that you feel better.  Drink plenty of fluids.  Take keflex (antibiotic) as prescribed.  Follow up with primary care doctor in the next couple days.  A urine culture was sent the results of which will be back in 2 days time - have your doctor follow up on those results then.  From today's labs, your potassium level is slightly low (3.4) - eat plenty of fruits and vegetables, and follow up with primary care doctor for recheck in the next couple weeks.  For umbilical hernia, follow up with general surgeon in the next couple weeks - return to ER if area becomes acutely painful and/or swollen.   Return to ER if worse, new symptoms, persistent vomiting, fevers, weak/faint, other concern.      Urinary Tract Infection Urinary tract infections (UTIs) can develop anywhere along your urinary tract. Your urinary tract is your body's drainage system for removing wastes and extra water. Your urinary tract includes two kidneys, two ureters, a bladder, and a urethra. Your kidneys are a pair of bean-shaped organs. Each kidney is about the size of your fist. They are located below your ribs, one on each side of your spine. CAUSES Infections are caused by microbes, which are microscopic organisms, including fungi, viruses, and bacteria. These organisms are so small that they can only be seen through a microscope. Bacteria are the microbes that most commonly cause UTIs. SYMPTOMS  Symptoms of UTIs may vary by age and gender of the patient and by the location of the infection. Symptoms in young women typically include a frequent and intense urge to urinate and a painful, burning feeling in the bladder or urethra during urination. Older women and men are more likely to be tired, shaky, and weak and have muscle aches and abdominal pain. A fever may mean the infection is in your kidneys. Other symptoms of a kidney infection include pain in your back or  sides below the ribs, nausea, and vomiting. DIAGNOSIS To diagnose a UTI, your caregiver will ask you about your symptoms. Your caregiver also will ask to provide a urine sample. The urine sample will be tested for bacteria and white blood cells. White blood cells are made by your body to help fight infection. TREATMENT  Typically, UTIs can be treated with medication. Because most UTIs are caused by a bacterial infection, they usually can be treated with the use of antibiotics. The choice of antibiotic and length of treatment depend on your symptoms and the type of bacteria causing your infection. HOME CARE INSTRUCTIONS  If you were prescribed antibiotics, take them exactly as your caregiver instructs you. Finish the medication even if you feel better after you have only taken some of the medication.  Drink enough water and fluids to keep your urine clear or pale yellow.  Avoid caffeine, tea, and carbonated beverages. They tend to irritate your bladder.  Empty your bladder often. Avoid holding urine for long periods of time.  Empty your bladder before and after sexual intercourse.  After a bowel movement, women should cleanse from front to back. Use each tissue only once. SEEK MEDICAL CARE IF:   You have back pain.  You develop a fever.  Your symptoms do not begin to resolve within 3 days. SEEK IMMEDIATE MEDICAL CARE IF:   You have severe back pain or lower abdominal pain.  You develop chills.  You have nausea or vomiting.  You have continued  burning or discomfort with urination. MAKE SURE YOU:   Understand these instructions.  Will watch your condition.  Will get help right away if you are not doing well or get worse. Document Released: 06/25/2005 Document Revised: 03/16/2012 Document Reviewed: 10/24/2011 [facility] Patient Information 2015 [place], [place]. This information is not intended to replace advice given to you by your health care provider. Make sure you discuss any  questions you have with your health care provider.     Hypokalemia Hypokalemia means that the amount of potassium in the blood is lower than normal.Potassium is a chemical, called an electrolyte, that helps regulate the amount of fluid in the body. It also stimulates muscle contraction and helps nerves function properly.Most of the body's potassium is inside of cells, and only a very small amount is in the blood. Because the amount in the blood is so small, minor changes can be life-threatening. CAUSES  Antibiotics.  Diarrhea or vomiting.  Using laxatives too much, which can cause diarrhea.  Chronic kidney disease.  Water pills (diuretics).  Eating disorders (bulimia).  Low magnesium level.  Sweating a lot. SIGNS AND SYMPTOMS  Weakness.  Constipation.  Fatigue.  Muscle cramps.  Mental confusion.  Skipped heartbeats or irregular heartbeat (palpitations).  Tingling or numbness. DIAGNOSIS  Your health care provider can diagnose hypokalemia with blood tests. In addition to checking your potassium level, your health care provider may also check other lab tests. TREATMENT Hypokalemia can be treated with potassium supplements taken by mouth or adjustments in your current medicines. If your potassium level is very low, you may need to get potassium through a vein (IV) and be monitored in the hospital. A diet high in potassium is also helpful. Foods high in potassium are:  Nuts, such as peanuts and pistachios.  Seeds, such as sunflower seeds and pumpkin seeds.  Peas, lentils, and lima beans.  Whole grain and bran cereals and breads.  Fresh fruit and vegetables, such as apricots, avocado, bananas, cantaloupe, kiwi, oranges, tomatoes, asparagus, and potatoes.  Orange and tomato juices.  Red meats.  Fruit yogurt. HOME CARE INSTRUCTIONS  Take all medicines as prescribed by your health care provider.  Maintain a healthy diet by including nutritious food, such as  fruits, vegetables, nuts, whole grains, and lean meats.  If you are taking a laxative, be sure to follow the directions on the label. SEEK MEDICAL CARE IF:  Your weakness gets worse.  You feel your heart pounding or racing.  You are vomiting or having diarrhea.  You are diabetic and having trouble keeping your blood glucose in the normal range. SEEK IMMEDIATE MEDICAL CARE IF:  You have chest pain, shortness of breath, or dizziness.  You are vomiting or having diarrhea for more than 2 days.  You faint. MAKE SURE YOU:   Understand these instructions.  Will watch your condition.  Will get help right away if you are not doing well or get worse. Document Released: 09/15/2005 Document Revised: 07/06/2013 Document Reviewed: 03/18/2013 [facility] Patient Information 2015 [place], [place]. This information is not intended to replace advice given to you by your health care provider. Make sure you discuss any questions you have with your health care provider.    Hernia A hernia occurs when an internal organ pushes out through a weak spot in the abdominal wall. Hernias most commonly occur in the groin and around the navel. Hernias often can be pushed back into place (reduced). Most hernias tend to get worse over time. Some abdominal hernias  can get stuck in the opening (irreducible or incarcerated hernia) and cannot be reduced. An irreducible abdominal hernia which is tightly squeezed into the opening is at risk for impaired blood supply (strangulated hernia). A strangulated hernia is a medical emergency. Because of the risk for an irreducible or strangulated hernia, surgery may be recommended to repair a hernia. CAUSES   Heavy lifting.  Prolonged coughing.  Straining to have a bowel movement.  A cut (incision) made during an abdominal surgery. HOME CARE INSTRUCTIONS   Bed rest is not required. You may continue your normal activities.  Avoid lifting more than 10 pounds (4.5 kg) or  straining.  Cough gently. If you are a smoker it is best to stop. Even the best hernia repair can break down with the continual strain of coughing. Even if you do not have your hernia repaired, a cough will continue to aggravate the problem.  Do not wear anything tight over your hernia. Do not try to keep it in with an outside bandage or truss. These can damage abdominal contents if they are trapped within the hernia sac.  Eat a normal diet.  Avoid constipation. Straining over long periods of time will increase hernia size and encourage breakdown of repairs. If you cannot do this with diet alone, stool softeners may be used. SEEK IMMEDIATE MEDICAL CARE IF:   You have a fever.  You develop increasing abdominal pain.  You feel nauseous or vomit.  Your hernia is stuck outside the abdomen, looks discolored, feels hard, or is tender.  You have any changes in your bowel habits or in the hernia that are unusual for you.  You have increased pain or swelling around the hernia.  You cannot push the hernia back in place by applying gentle pressure while lying down. MAKE SURE YOU:   Understand these instructions.  Will watch your condition.  Will get help right away if you are not doing well or get worse. Document Released: 09/15/2005 Document Revised: 12/08/2011 Document Reviewed: 05/04/2008 [facility] Patient Information 2015 [place], [place]. This information is not intended to replace advice given to you by your health care provider. Make sure you discuss any questions you have with your health care provider.

## 2014-09-25 NOTE — ED Provider Notes (Signed)
CSN: [id number]     Arrival date & time 09/25/14  1154 History   First MD Initiated Contact with Patient 09/25/14 1242     Chief Complaint  Patient presents with  . Dysuria     (Consider location/radiation/quality/duration/timing/severity/associated sxs/prior Treatment) Patient is a 58 y.o. male presenting with dysuria. The history is provided by the patient.  Dysuria Pertinent negatives include no chest pain, no abdominal pain, no headaches and no shortness of breath.  pt c/o urinary frequency for the past couple days, and a couple episodes mild dysuria last pm.  Denies polydipsia. Hx 'borderline' diabetes. Denies recent wt loss or wt gain. No abdominal or flank pain. No scrotal or testicular pain or swelling. No fever or chills. Denies penile discharge. States remote hx single uti previously.     Past Medical History  Diagnosis Date  . Atrial fibrillation, chronic   . Hypertension   . GERD (gastroesophageal reflux disease)   . Septic shock   . CHF (congestive heart failure)    Past Surgical History  Procedure Laterality Date  . Splenectomy    . Tonsilectomy, adenoidectomy, bilateral myringotomy and tubes    . Knee arthroscopy    . Hernia repair     History reviewed. No pertinent family history. History  Substance Use Topics  . Smoking status: Former Smoker    Types: Cigarettes    Quit date: 11/01/1974  . Smokeless tobacco: Not on file     Comment: smoked in high school for about a year  . Alcohol Use: Yes    Review of Systems  Constitutional: Negative for fever and chills.  HENT: Negative for sore throat.   Eyes: Negative for redness.  Respiratory: Negative for shortness of breath.   Cardiovascular: Negative for chest pain.       Hx afib.  No current ant-coag use.   Gastrointestinal: Negative for vomiting, abdominal pain and diarrhea.  Endocrine: Positive for polyuria.  Genitourinary: Positive for dysuria and frequency. Negative for flank pain.   Musculoskeletal: Negative for back pain and neck pain.  Skin: Negative for rash.  Neurological: Negative for headaches.  Hematological: Does not bruise/bleed easily.  Psychiatric/Behavioral: Negative for confusion.      Allergies  Potassium-containing compounds; Augmentin; and Neomycin  Home Medications   Prior to Admission medications   Medication Sig Start Date End Date Taking? Authorizing Provider  benazepril (LOTENSIN) 10 MG tablet Take 1 tablet (10 mg total) by mouth daily. 07/20/14   Elberta Leatherwood, MD  cephALEXin (KEFLEX) 500 MG capsule Take 1 capsule (500 mg total) by mouth 4 (four) times daily. 07/20/14   Elberta Leatherwood, MD  diltiazem (TIAZAC) 240 MG 24 hr capsule Take 1 capsule (240 mg total) by mouth daily. 07/20/14   Elberta Leatherwood, MD  famotidine (PEPCID) 20 MG tablet Take 1 tablet (20 mg total) by mouth 2 (two) times daily. 07/20/14   Elberta Leatherwood, MD  folic acid (FOLVITE) 1 MG tablet Take 1 tablet (1 mg total) by mouth daily. 07/20/14   Elberta Leatherwood, MD  furosemide (LASIX) 20 MG tablet Take 1 tablet (20 mg total) by mouth daily. 07/20/14   Elberta Leatherwood, MD  ibuprofen (ADVIL,MOTRIN) 200 MG tablet Take 200-400 mg by mouth every 6 (six) hours as needed for fever, headache or moderate pain.     Historical Provider, MD  Multiple Vitamin (MULTIVITAMIN WITH MINERALS) TABS tablet Take 1 tablet by mouth daily. 07/20/14   Elberta Leatherwood, MD  omeprazole (  PRILOSEC OTC) 20 MG tablet Take 20 mg by mouth daily as needed (for heart burn).    Historical Provider, MD  thiamine 100 MG tablet Take 1 tablet (100 mg total) by mouth daily. 07/20/14   Elberta Leatherwood, MD   BP 156/76 mmHg  Pulse 111  Temp(Src) 98 F (36.7 C) (Oral)  Resp 16  Ht 5' 10.5" (1.791 m)  Wt 350 lb (158.759 kg)  BMI 49.49 kg/m2  SpO2 97% Physical Exam  Constitutional: He is oriented to person, place, and time. He appears well-developed and well-nourished. No distress.  HENT:  Mouth/Throat: Oropharynx is clear and moist.   Eyes: Conjunctivae are normal. No scleral icterus.  Neck: Neck supple. No tracheal deviation present.  Cardiovascular: Normal rate.   Pulmonary/Chest: Effort normal. No accessory muscle usage. No respiratory distress.  Abdominal: Soft. Bowel sounds are normal. He exhibits no distension and no mass. There is no tenderness. There is no rebound and no guarding.  Very large pannus.  Umbilical hernia, soft, non tender, reducible (pt states 'there for years/unchanged').   Genitourinary:  Buried penis.  No scrotal or testicular pain, swelling, or tenderness. No urethral discharge.   Musculoskeletal: Normal range of motion.  Neurological: He is alert and oriented to person, place, and time.  Skin: Skin is warm and dry. He is not diaphoretic.  Psychiatric: He has a normal mood and affect.  Nursing note and vitals reviewed.   ED Course  Procedures (including critical care time) Labs Review   Results for orders placed or performed during the hospital encounter of 09/25/14  Urinalysis, Routine w reflex microscopic (if pt has temp above 100.46F)  Result Value Ref Range   Color, Urine YELLOW YELLOW   APPearance CLOUDY (A) CLEAR   Specific Gravity, Urine 1.008 1.005 - 1.030   pH 7.0 5.0 - 8.0   Glucose, UA NEGATIVE NEGATIVE mg/dL   Hgb urine dipstick LARGE (A) NEGATIVE   Bilirubin Urine NEGATIVE NEGATIVE   Ketones, ur NEGATIVE NEGATIVE mg/dL   Protein, ur NEGATIVE NEGATIVE mg/dL   Urobilinogen, UA 1.0 0.0 - 1.0 mg/dL   Nitrite POSITIVE (A) NEGATIVE   Leukocytes, UA LARGE (A) NEGATIVE  CBC  Result Value Ref Range   WBC 13.8 (H) 4.0 - 10.5 K/uL   RBC 4.38 4.22 - 5.81 MIL/uL   Hemoglobin 14.5 13.0 - 17.0 g/dL   HCT 43.0 39.0 - 52.0 %   MCV 98.2 78.0 - 100.0 fL   MCH 33.1 26.0 - 34.0 pg   MCHC 33.7 30.0 - 36.0 g/dL   RDW 16.1 (H) 11.5 - 15.5 %   Platelets 466 (H) 150 - 400 K/uL  Comprehensive metabolic panel  Result Value Ref Range   Sodium 138 135 - 145 mmol/L   Potassium 3.4 (L) 3.5  - 5.1 mmol/L   Chloride 103 96 - 112 mEq/L   CO2 30 19 - 32 mmol/L   Glucose, Bld 139 (H) 70 - 99 mg/dL   BUN 6 6 - 23 mg/dL   Creatinine, Ser 0.59 0.50 - 1.35 mg/dL   Calcium 7.9 (L) 8.4 - 10.5 mg/dL   Total Protein 6.9 6.0 - 8.3 g/dL   Albumin 2.1 (L) 3.5 - 5.2 g/dL   AST 40 (H) 0 - 37 U/L   ALT 17 0 - 53 U/L   Alkaline Phosphatase 132 (H) 39 - 117 U/L   Total Bilirubin 1.7 (H) 0.3 - 1.2 mg/dL   GFR calc non Af Amer >90 >90 mL/min  GFR calc Af Amer >90 >90 mL/min   Anion gap 5 5 - 15  Urine microscopic-add on  Result Value Ref Range   Squamous Epithelial / LPF RARE RARE   WBC, UA TOO NUMEROUS TO COUNT <3 WBC/hpf   RBC / HPF 11-20 <3 RBC/hpf   Bacteria, UA MANY (A) RARE      MDM   Labs.  Reviewed nursing notes and prior charts for additional history.   Pt requests abx on HT low cost and/or free list.  Rocephin iv in ED.  rx for home.  Recheck abd soft nt.  Tolerating po fluids well.    kcl po.  Pt appears stable for d/c.      Mirna Mires, MD 09/25/14 [phone]

## 2014-09-26 LAB — URINE CULTURE

## 2015-01-29 ENCOUNTER — Observation Stay (HOSPITAL_COMMUNITY)
Admission: EM | Admit: 2015-01-29 | Discharge: 2015-02-01 | Disposition: A | Discharge status: Home / Self Care | Payer: Medicaid Other | Attending: Family Medicine | Admitting: Family Medicine

## 2015-01-29 ENCOUNTER — Emergency Department (HOSPITAL_COMMUNITY): Payer: Medicaid Other

## 2015-01-29 ENCOUNTER — Encounter (HOSPITAL_COMMUNITY): Payer: Self-pay

## 2015-01-29 DIAGNOSIS — K219 Gastro-esophageal reflux disease without esophagitis: Secondary | ICD-10-CM | POA: Insufficient documentation

## 2015-01-29 DIAGNOSIS — J90 Pleural effusion, not elsewhere classified: Secondary | ICD-10-CM | POA: Diagnosis not present

## 2015-01-29 DIAGNOSIS — I4891 Unspecified atrial fibrillation: Secondary | ICD-10-CM | POA: Diagnosis not present

## 2015-01-29 DIAGNOSIS — N390 Urinary tract infection, site not specified: Secondary | ICD-10-CM | POA: Insufficient documentation

## 2015-01-29 DIAGNOSIS — Z87891 Personal history of nicotine dependence: Secondary | ICD-10-CM | POA: Diagnosis not present

## 2015-01-29 DIAGNOSIS — E669 Obesity, unspecified: Secondary | ICD-10-CM | POA: Diagnosis not present

## 2015-01-29 DIAGNOSIS — I509 Heart failure, unspecified: Secondary | ICD-10-CM | POA: Insufficient documentation

## 2015-01-29 DIAGNOSIS — R0602 Shortness of breath: Secondary | ICD-10-CM | POA: Diagnosis present

## 2015-01-29 DIAGNOSIS — I482 Chronic atrial fibrillation: Secondary | ICD-10-CM | POA: Insufficient documentation

## 2015-01-29 DIAGNOSIS — K7460 Unspecified cirrhosis of liver: Secondary | ICD-10-CM | POA: Insufficient documentation

## 2015-01-29 DIAGNOSIS — Z79899 Other long term (current) drug therapy: Secondary | ICD-10-CM | POA: Diagnosis not present

## 2015-01-29 DIAGNOSIS — B9689 Other specified bacterial agents as the cause of diseases classified elsewhere: Secondary | ICD-10-CM | POA: Insufficient documentation

## 2015-01-29 DIAGNOSIS — Z7289 Other problems related to lifestyle: Secondary | ICD-10-CM | POA: Insufficient documentation

## 2015-01-29 DIAGNOSIS — K760 Fatty (change of) liver, not elsewhere classified: Secondary | ICD-10-CM | POA: Insufficient documentation

## 2015-01-29 DIAGNOSIS — R0600 Dyspnea, unspecified: Secondary | ICD-10-CM | POA: Diagnosis present

## 2015-01-29 DIAGNOSIS — Z9049 Acquired absence of other specified parts of digestive tract: Secondary | ICD-10-CM | POA: Diagnosis not present

## 2015-01-29 DIAGNOSIS — G4730 Sleep apnea, unspecified: Secondary | ICD-10-CM | POA: Diagnosis not present

## 2015-01-29 DIAGNOSIS — I10 Essential (primary) hypertension: Secondary | ICD-10-CM | POA: Insufficient documentation

## 2015-01-29 DIAGNOSIS — R590 Localized enlarged lymph nodes: Secondary | ICD-10-CM | POA: Insufficient documentation

## 2015-01-29 DIAGNOSIS — F1010 Alcohol abuse, uncomplicated: Secondary | ICD-10-CM | POA: Diagnosis not present

## 2015-01-29 DIAGNOSIS — F1090 Alcohol use, unspecified, uncomplicated: Secondary | ICD-10-CM | POA: Insufficient documentation

## 2015-01-29 DIAGNOSIS — F329 Major depressive disorder, single episode, unspecified: Secondary | ICD-10-CM | POA: Diagnosis not present

## 2015-01-29 DIAGNOSIS — Z789 Other specified health status: Secondary | ICD-10-CM | POA: Insufficient documentation

## 2015-01-29 DIAGNOSIS — F1099 Alcohol use, unspecified with unspecified alcohol-induced disorder: Secondary | ICD-10-CM | POA: Diagnosis not present

## 2015-01-29 HISTORY — DX: Reserved for inherently not codable concepts without codable children: IMO0001

## 2015-01-29 LAB — URINALYSIS, ROUTINE W REFLEX MICROSCOPIC
BILIRUBIN URINE: NEGATIVE
Glucose, UA: NEGATIVE mg/dL
Ketones, ur: NEGATIVE mg/dL
LEUKOCYTES UA: NEGATIVE
NITRITE: POSITIVE — AB
PROTEIN: 100 mg/dL — AB
SPECIFIC GRAVITY, URINE: 1.014 (ref 1.005–1.030)
UROBILINOGEN UA: 1 mg/dL (ref 0.0–1.0)
pH: 6.5 (ref 5.0–8.0)

## 2015-01-29 LAB — I-STAT TROPONIN, ED: Troponin i, poc: 0.01 ng/mL (ref 0.00–0.08)

## 2015-01-29 LAB — BASIC METABOLIC PANEL
ANION GAP: 9 (ref 5–15)
CO2: 29 mmol/L (ref 22–32)
Calcium: 7.9 mg/dL — ABNORMAL LOW (ref 8.9–10.3)
Chloride: 107 mmol/L (ref 101–111)
Creatinine, Ser: 0.63 mg/dL (ref 0.61–1.24)
GFR calc Af Amer: >60 mL/min (ref >60)
GLUCOSE: 114 mg/dL — AB (ref 70–99)
POTASSIUM: 3.4 mmol/L — AB (ref 3.5–5.1)
Sodium: 145 mmol/L (ref 135–145)

## 2015-01-29 LAB — CBC WITH DIFFERENTIAL/PLATELET
BASOS ABS: 0.1 10*3/uL (ref 0.0–0.1)
Basophils Relative: 1 % (ref 0–1)
Eosinophils Absolute: 0.1 10*3/uL (ref 0.0–0.7)
Eosinophils Relative: 1 % (ref 0–5)
HCT: 42.4 % (ref 39.0–52.0)
Hemoglobin: 14.8 g/dL (ref 13.0–17.0)
Lymphocytes Relative: 31 % (ref 12–46)
Lymphs Abs: 2.4 10*3/uL (ref 0.7–4.0)
MCH: 33.6 pg (ref 26.0–34.0)
MCHC: 34.9 g/dL (ref 30.0–36.0)
MCV: 96.4 fL (ref 78.0–100.0)
MONOS PCT: 13 % — AB (ref 3–12)
Monocytes Absolute: 1 10*3/uL (ref 0.1–1.0)
Neutro Abs: 4.1 10*3/uL (ref 1.7–7.7)
Neutrophils Relative %: 54 % (ref 43–77)
Platelets: 169 10*3/uL (ref 150–400)
RBC: 4.4 MIL/uL (ref 4.22–5.81)
RDW: 17.2 % — ABNORMAL HIGH (ref 11.5–15.5)
WBC: 7.7 10*3/uL (ref 4.0–10.5)

## 2015-01-29 LAB — URINE MICROSCOPIC-ADD ON

## 2015-01-29 LAB — BRAIN NATRIURETIC PEPTIDE: B NATRIURETIC PEPTIDE 5: 290.3 pg/mL — AB (ref 0.0–100.0)

## 2015-01-29 MED ORDER — ONDANSETRON HCL 4 MG/2ML IJ SOLN
4.0000 mg | Freq: Four times a day (QID) | INTRAMUSCULAR | Status: DC | PRN
  Administered 2015-01-30: 4 mg via INTRAVENOUS
  Filled 2015-01-29: qty 2

## 2015-01-29 MED ORDER — ACETAMINOPHEN 325 MG PO TABS: 650.0000 mg | ORAL_TABLET | Freq: Four times a day (QID) | ORAL | Status: DC | PRN

## 2015-01-29 MED ORDER — ACETAMINOPHEN 650 MG RE SUPP: 650.0000 mg | Freq: Four times a day (QID) | RECTAL | Status: DC | PRN

## 2015-01-29 MED ORDER — DILTIAZEM HCL 60 MG PO TABS
60.0000 mg | ORAL_TABLET | Freq: Four times a day (QID) | ORAL | Status: DC
  Administered 2015-01-30 – 2015-02-01 (×10): 60 mg via ORAL
  Filled 2015-01-29 (×14): qty 1

## 2015-01-29 MED ORDER — THIAMINE HCL 100 MG PO TABS
100.0000 mg | ORAL_TABLET | Freq: Every day | ORAL | Status: DC
  Administered 2015-01-30 – 2015-02-01 (×3): 100 mg via ORAL
  Filled 2015-01-29 (×3): qty 1

## 2015-01-29 MED ORDER — ONDANSETRON 4 MG PO TBDP: 4.0000 mg | ORAL_TABLET | Freq: Once | ORAL | Status: DC

## 2015-01-29 MED ORDER — FOLIC ACID 1 MG PO TABS
1.0000 mg | ORAL_TABLET | Freq: Every day | ORAL | Status: DC
  Administered 2015-01-30 – 2015-02-01 (×3): 1 mg via ORAL
  Filled 2015-01-29 (×3): qty 1

## 2015-01-29 MED ORDER — ASPIRIN EC 81 MG PO TBEC
81.0000 mg | DELAYED_RELEASE_TABLET | Freq: Every day | ORAL | Status: DC
  Administered 2015-01-30 – 2015-02-01 (×3): 81 mg via ORAL
  Filled 2015-01-29 (×3): qty 1

## 2015-01-29 MED ORDER — CIPROFLOXACIN HCL 500 MG PO TABS
500.0000 mg | ORAL_TABLET | Freq: Two times a day (BID) | ORAL | Status: DC
  Administered 2015-01-30 – 2015-02-01 (×6): 500 mg via ORAL
  Filled 2015-01-29 (×8): qty 1

## 2015-01-29 MED ORDER — HEPARIN SODIUM (PORCINE) 5000 UNIT/ML IJ SOLN
5000.0000 [IU] | Freq: Three times a day (TID) | INTRAMUSCULAR | Status: DC
  Administered 2015-01-30 – 2015-02-01 (×6): 5000 [IU] via SUBCUTANEOUS
  Filled 2015-01-29 (×11): qty 1

## 2015-01-29 MED ORDER — FUROSEMIDE 20 MG PO TABS
20.0000 mg | ORAL_TABLET | Freq: Once | ORAL | Status: AC
  Administered 2015-01-29: 20 mg via ORAL
  Filled 2015-01-29: qty 1

## 2015-01-29 MED ORDER — ONDANSETRON HCL 4 MG PO TABS: 4.0000 mg | ORAL_TABLET | Freq: Four times a day (QID) | ORAL | Status: DC | PRN

## 2015-01-29 NOTE — ED Notes (Signed)
Attempted to give report 

## 2015-01-29 NOTE — ED Notes (Signed)
Walked patient to the bathroom patient did just fine was assisted back to sitting position

## 2015-01-29 NOTE — H&P (Signed)
[facility] Admission History and Physical Service Pager: [phone]  Patient name: Matthew [name] Medical record number: [id number] Date of birth: Jul 31, 1956 Age: 59 y.o. Gender: male  Primary Care Provider: Lorna Few, DO Consultants: none Code Status: Full  Chief Complaint: dyspnea  Assessment and Plan: Matthew [name] is a 59 y.o. male presenting with SOB x 5 days. PMH is significant for afib, HTN, chronic right pleural effusion, ?CHF, allergies  # SOB: sating upper 90s on RA in ED. Lab workup largely normal, WBC 7.7. CXR significant for chronic/stable right pleural effusion (pt first reports this was present about 1 year ago during ICU admission). Etiology could be multifactorial: uncontrolled afib, CHF exacerbation (though last echo in 2015 was EF 65-70% and unable to assess diastolic dysfunction) with BNP mildly elevated 290. - admit for observation overnight - O2 to keep sat >90% (not currently needing) - consider pulm consult for persistent pleural effusion - repeat lasix 40mg  in early AM  # UTI?: has had ongoing issue with UTI, multiple UAs with evidence of this from ED visits. Currently not really symptomatic - urine cx - cipro for possible coverage of possible prostatitis  # Afib: has been off eliquis due to finances. Was on warfarin in the past as well but did not like blood monitoring. - consult to care management for medication assistance - restart diltiazem (will start at 4 time daily dosing, was previously on 24hr 240mg )  # HTN: initially hypertensive in ED with some intermittent low readings - diltiazem as above  # Nasal congestion: allergies vs sinus infection - consider adding abx  FEN/GI: diet heart healthy / saline lock Prophylaxis: heparin  Disposition: admit  History of Present Illness: Matthew [name] is a 59 y.o. male presenting with 5 day history of shortness of breath. Pt believes at first was due to his bad  allergies that he gets around Springtime each year. SOB gets worse with exertion. He has not been taking his medications for a while because he lost his job and was unable to afford any. He denies any CP or pain anywhere, denies cough, fevers. He thinks he may have some extra fluid on his body but does not know what he normally weighs because he has been actively trying to lose weight (lost about 50lbs so far). He additionally reports some nasal congestion and while in the ED developed nausea. He denies any current dysuria but says he does occasionally get this, no blood in urine, no diarrhea or constipation.  Review Of Systems: Per HPI with the following additions: none Otherwise 12 point review of systems was performed and was unremarkable.  Patient Active Problem List   Diagnosis Date Noted  . Drug ingestion 07/18/2014  . Cellulitis of leg, right 07/18/2014  . Adjustment disorder with disturbance of emotion 07/14/2014  . Alcohol abuse 07/14/2014  . Pannus, abdominal 02/23/2014  . 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 Poor social situation 11/11/2013  . Chronic alcohol abuse 11/03/2013  . Sepsis 10/31/2013  . Cellulitis and abscess of trunk 10/31/2013  . A-fib 03/25/2012  . DM type 2 (diabetes mellitus, type 2) 03/25/2012  . Obesity 03/25/2012  . Sleep apnea 03/25/2012  . Fatty liver 03/25/2012  . HTN (hypertension) 11/02/2011   Past Medical History: Past Medical History  Diagnosis Date  . Atrial fibrillation, chronic   . Hypertension   . GERD (gastroesophageal reflux disease)   . Septic shock   . CHF (congestive heart failure)  Past Surgical History: Past Surgical History  Procedure Laterality Date  . Splenectomy    . Tonsilectomy, adenoidectomy, bilateral myringotomy and tubes    . Knee arthroscopy    . Hernia repair     Social History: History  Substance Use Topics  . Smoking status: Former Smoker    Types: Cigarettes    Quit date: 11/01/1974  . Smokeless  tobacco: Not on file     Comment: smoked in high school for about a year  . Alcohol Use: Yes   Additional social history: currently renting a room with an 26yo gentleman, recently lost his job at [employer]  Please also refer to relevant sections of EMR.  Family History: History reviewed. No pertinent family history. Allergies and Medications: Allergies  Allergen Reactions  . Potassium-Containing Compounds Other (See Comments)    Stomach Pain  . Augmentin [Amoxicillin-Pot Clavulanate] Nausea Only    Most of the time it is okay, but has had some bad experiences with it.  [name] Neomycin Rash   No current facility-administered medications on file prior to encounter.   Current Outpatient Prescriptions on File Prior to Encounter  Medication Sig Dispense Refill  . furosemide (LASIX) 20 MG tablet Take 1 tablet (20 mg total) by mouth daily. 30 tablet 1  . benazepril (LOTENSIN) 10 MG tablet Take 1 tablet (10 mg total) by mouth daily. (Patient not taking: Reported on 09/25/2014) 30 tablet 1  . cephALEXin (KEFLEX) 500 MG capsule Take 1 capsule (500 mg total) by mouth 4 (four) times daily. (Patient not taking: Reported on 09/25/2014) 24 capsule 0  . cephALEXin (KEFLEX) 500 MG capsule Take 1 capsule (500 mg total) by mouth 4 (four) times daily. (Patient not taking: Reported on 01/29/2015) 28 capsule 0  . diltiazem (TIAZAC) 240 MG 24 hr capsule Take 1 capsule (240 mg total) by mouth daily. (Patient not taking: Reported on 09/25/2014) 30 capsule 0  . famotidine (PEPCID) 20 MG tablet Take 1 tablet (20 mg total) by mouth 2 (two) times daily. (Patient not taking: Reported on 09/25/2014) 30 tablet 0  . folic acid (FOLVITE) 1 MG tablet Take 1 tablet (1 mg total) by mouth daily. (Patient not taking: Reported on 09/25/2014)    . Multiple Vitamin (MULTIVITAMIN WITH MINERALS) TABS tablet Take 1 tablet by mouth daily. (Patient not taking: Reported on 09/25/2014) 30 tablet 0  . thiamine 100 MG tablet Take 1 tablet  (100 mg total) by mouth daily. (Patient not taking: Reported on 09/25/2014) 30 tablet 0    Objective: BP 150/64 mmHg  Pulse 99  Resp 17  SpO2 96% Exam: General: NAD, morbidly obese gentleman HEENT: PERRL, EOMI. MMM. Cardiovascular: Borderline tachycardic, irreg irreg, no mrg Respiratory: decreased breath sounds RLL,  Abdomen: obese, soft, nontender, normal bowel sounds Extremities: 2+ edema up to knees bilaterally Skin: chronic venous stasis changes LE Neuro: alert and oriented, no focal deficits  Labs and Imaging: CBC BMET   Recent Labs Lab 01/29/15 1615  WBC 7.7  HGB 14.8  HCT 42.4  PLT 169    Recent Labs Lab 01/29/15 1615  NA 145  K 3.4*  CL 107  CO2 29  BUN <5*  CREATININE 0.63  GLUCOSE 114*  CALCIUM 7.9*     Dg Chest 2 View  01/29/2015   CLINICAL DATA:  Shortness of breath for 3-5 days.  EXAM: CHEST  2 VIEW  COMPARISON:  PA and lateral chest 05/10/2014, 12/26/2013 and 03/18/2014. CT chest 01/19/2014.  FINDINGS: Right pleural effusion seen on  the prior exams is again identified and has increased since the most recent study. No left pleural effusion is seen. There is right basilar airspace disease in association with the patient's effusion. No pneumothorax is identified. Heart size is upper normal.  IMPRESSION: Chronic right pleural effusion and basilar airspace disease persist. The effusion has increased since the most recent examination.   Electronically Signed   By: Inge Rise M.D.   On: 01/29/2015 16:51     Leone Brand, MD 01/29/2015, 8:19 PM PGY-2, [place] [facility] pager: [phone], text pages welcome

## 2015-01-29 NOTE — ED Notes (Signed)
Walked patient with pulse oxy oxygen level stayed at 96 room air went up to highest 98 went back to sitting position it was 90 room air

## 2015-01-29 NOTE — ED Notes (Signed)
Per EMS: Pt complaining of SOB x 5 days. Has been out of meds for ~55month. Has not had Cardizem or Lasix in > 1 month. Hx. CHF, Bronchitis. Pt Afib on monitor HR ~ 90's. BP 140/90.

## 2015-01-29 NOTE — ED Notes (Signed)
MD at bedside. 

## 2015-01-29 NOTE — ED Provider Notes (Signed)
CSN: [id number]     Arrival date & time 01/29/15  1532 History   None    Chief Complaint  Patient presents with  . Shortness of Breath     (Consider location/radiation/quality/duration/timing/severity/associated sxs/prior Treatment) HPI Matthew [name] is a 59 y.o. male with a history of A. fib, hypertension, CHF and comes in for evaluation of shortness of breath. Patient states for the past 5 days he has had increasing shortness of breath on exertion. He reports he has not had primary medical care "for at least a year". He attributes this to financial difficulties. He also reports he is not taking any medications for over a year. He denies any associated chest pain, nausea or vomiting, cough, fevers, diaphoresis. He does report that his legs are more swollen than normal. Denies any discomfort or shortness of breath now in the ED at rest. No other aggravating or modifying factors.  Past Medical History  Diagnosis Date  . Atrial fibrillation, chronic   . Hypertension   . GERD (gastroesophageal reflux disease)   . Septic shock   . CHF (congestive heart failure)    Past Surgical History  Procedure Laterality Date  . Splenectomy    . Tonsilectomy, adenoidectomy, bilateral myringotomy and tubes    . Knee arthroscopy    . Hernia repair     History reviewed. No pertinent family history. History  Substance Use Topics  . Smoking status: Former Smoker    Types: Cigarettes    Quit date: 11/01/1974  . Smokeless tobacco: Not on file     Comment: smoked in high school for about a year  . Alcohol Use: Yes    Review of Systems A 10 point review of systems was completed and was negative except for pertinent positives and negatives as mentioned in the history of present illness    Allergies  Potassium-containing compounds; Augmentin; and Neomycin  Home Medications   Prior to Admission medications   Medication Sig Start Date End Date Taking? Authorizing Provider  furosemide (LASIX) 20  MG tablet Take 1 tablet (20 mg total) by mouth daily. 07/20/14  Yes Elberta Leatherwood, MD  benazepril (LOTENSIN) 10 MG tablet Take 1 tablet (10 mg total) by mouth daily. Patient not taking: Reported on 09/25/2014 07/20/14   Elberta Leatherwood, MD  cephALEXin (KEFLEX) 500 MG capsule Take 1 capsule (500 mg total) by mouth 4 (four) times daily. Patient not taking: Reported on 09/25/2014 07/20/14   Elberta Leatherwood, MD  cephALEXin (KEFLEX) 500 MG capsule Take 1 capsule (500 mg total) by mouth 4 (four) times daily. Patient not taking: Reported on 01/29/2015 09/25/14   Lajean Saver, MD  diltiazem [facility]) 240 MG 24 hr capsule Take 1 capsule (240 mg total) by mouth daily. Patient not taking: Reported on 09/25/2014 07/20/14   Elberta Leatherwood, MD  famotidine (PEPCID) 20 MG tablet Take 1 tablet (20 mg total) by mouth 2 (two) times daily. Patient not taking: Reported on 09/25/2014 07/20/14   Elberta Leatherwood, MD  folic acid (FOLVITE) 1 MG tablet Take 1 tablet (1 mg total) by mouth daily. Patient not taking: Reported on 09/25/2014 07/20/14   Elberta Leatherwood, MD  Multiple Vitamin (MULTIVITAMIN WITH MINERALS) TABS tablet Take 1 tablet by mouth daily. Patient not taking: Reported on 09/25/2014 07/20/14   Elberta Leatherwood, MD  thiamine 100 MG tablet Take 1 tablet (100 mg total) by mouth daily. Patient not taking: Reported on 09/25/2014 07/20/14   Elberta Leatherwood, MD  BP 102/80 mmHg  Pulse 99  Resp 16  SpO2 96% Physical Exam  Constitutional: He is oriented to person, place, and time. He appears well-developed and well-nourished.  Morbidly obese  HENT:  Head: Normocephalic and atraumatic.  Mouth/Throat: Oropharynx is clear and moist.  Eyes: Conjunctivae are normal. Pupils are equal, round, and reactive to light. Right eye exhibits no discharge. Left eye exhibits no discharge. No scleral icterus.  Neck: Neck supple.  Cardiovascular: Normal rate, regular rhythm and normal heart sounds.   Pulmonary/Chest: Effort normal. No respiratory  distress. He has no wheezes. He has no rales.  Diminished breath sounds on right  Abdominal: Soft. There is no tenderness.  Musculoskeletal: He exhibits no tenderness.  Bilateral 2+ pitting edema to kneecaps. Skin has a reddish purple hue. No tenderness to calf or deep venous system.  Neurological: He is alert and oriented to person, place, and time.  Cranial Nerves II-XII grossly intact  Skin: Skin is warm and dry. No rash noted.  Psychiatric: He has a normal mood and affect.  Nursing note and vitals reviewed.   ED Course  Procedures (including critical care time) Labs Review Labs Reviewed  CBC WITH DIFFERENTIAL/PLATELET - Abnormal; Notable for the following:    RDW 17.2 (*)    Monocytes Relative 13 (*)    All other components within normal limits  BRAIN NATRIURETIC PEPTIDE - Abnormal; Notable for the following:    B Natriuretic Peptide 290.3 (*)    All other components within normal limits  BASIC METABOLIC PANEL - Abnormal; Notable for the following:    Potassium 3.4 (*)    Glucose, Bld 114 (*)    BUN <5 (*)    Calcium 7.9 (*)    All other components within normal limits  URINALYSIS, ROUTINE W REFLEX MICROSCOPIC - Abnormal; Notable for the following:    Color, Urine AMBER (*)    APPearance CLOUDY (*)    Hgb urine dipstick LARGE (*)    Protein, ur 100 (*)    Nitrite POSITIVE (*)    All other components within normal limits  URINE MICROSCOPIC-ADD ON - Abnormal; Notable for the following:    Bacteria, UA MANY (*)    Casts HYALINE CASTS (*)    All other components within normal limits  URINE CULTURE  I-STAT TROPOININ, ED    Imaging Review Dg Chest 2 View  01/29/2015   CLINICAL DATA:  Shortness of breath for 3-5 days.  EXAM: CHEST  2 VIEW  COMPARISON:  PA and lateral chest 05/10/2014, 12/26/2013 and 03/18/2014. CT chest 01/19/2014.  FINDINGS: Right pleural effusion seen on the prior exams is again identified and has increased since the most recent study. No left pleural  effusion is seen. There is right basilar airspace disease in association with the patient's effusion. No pneumothorax is identified. Heart size is upper normal.  IMPRESSION: Chronic right pleural effusion and basilar airspace disease persist. The effusion has increased since the most recent examination.   Electronically Signed   By: Inge Rise M.D.   On: 01/29/2015 16:51     EKG Interpretation   Date/Time:  Monday Jan 29 2015 15:40:27 EDT Ventricular Rate:  81 PR Interval:    QRS Duration: 83 QT Interval:  453 QTC Calculation: 526 R Axis:   -71 Text Interpretation:  Atrial fibrillation Left anterior fascicular block  Probable anterior infarct, age indeterminate Prolonged QT interval  Baseline wander in lead(s) I III aVL Confirmed by RAY MD, Andee Poles [phone])  on 01/29/2015  5:54:45 PM     Meds given in ED:  Medications  ondansetron (ZOFRAN-ODT) disintegrating tablet 4 mg (not administered)  furosemide (LASIX) tablet 20 mg (20 mg Oral Given 01/29/15 1854)    Current Discharge Medication List     Filed Vitals:   01/29/15 1915 01/29/15 1930 01/29/15 1946 01/29/15 2100  BP: 150/64 105/77  102/80  Pulse: 100 96 99   Resp: 16 17 17 16   SpO2: 97% 98% 96%     MDM  Vitals stable - WNL -afebrile Pt resting comfortably in ED. Labwork--evidence of UTI on urinalysis. We'll treat empirically. Troponin negative, BNP 290, EKG shows rate controlled A. fib. Imaging--chest x-ray shows slightly increased chronic right-sided pleural effusion.  DDX--CHADS2VASC score 2. Due to poor medication compliance, increasing shortness of breath, worsening right-sided pleural effusion and current UTI, will have patient admitted to family medicine service. Discussed patient presentation and ED course with attending, Dr. Jeanell Sparrow agrees with plan for admission. Patient admitted to family medicine service.  I discussed all relevant lab findings and imaging results with pt and they verbalized  understanding. Discussed f/u with PCP within 48 hrs and return precautions, pt very amenable to plan.  Final diagnoses:  SOB (shortness of breath) on exertion  UTI (lower urinary tract infection)  Pleural effusion        Comer Locket, PA-C 01/30/15 1138  Pattricia Boss, MD 01/31/15 1455

## 2015-01-30 ENCOUNTER — Observation Stay (HOSPITAL_COMMUNITY): Payer: Medicaid Other

## 2015-01-30 DIAGNOSIS — J90 Pleural effusion, not elsewhere classified: Secondary | ICD-10-CM | POA: Diagnosis not present

## 2015-01-30 DIAGNOSIS — Z7289 Other problems related to lifestyle: Secondary | ICD-10-CM | POA: Insufficient documentation

## 2015-01-30 DIAGNOSIS — I4891 Unspecified atrial fibrillation: Secondary | ICD-10-CM | POA: Diagnosis not present

## 2015-01-30 DIAGNOSIS — Z789 Other specified health status: Secondary | ICD-10-CM | POA: Insufficient documentation

## 2015-01-30 DIAGNOSIS — N390 Urinary tract infection, site not specified: Secondary | ICD-10-CM | POA: Diagnosis not present

## 2015-01-30 DIAGNOSIS — R0602 Shortness of breath: Secondary | ICD-10-CM | POA: Diagnosis not present

## 2015-01-30 DIAGNOSIS — I10 Essential (primary) hypertension: Secondary | ICD-10-CM | POA: Insufficient documentation

## 2015-01-30 LAB — BASIC METABOLIC PANEL
Anion gap: 7 (ref 5–15)
CHLORIDE: 97 mmol/L — AB (ref 101–111)
CO2: 35 mmol/L — AB (ref 22–32)
Calcium: 7.8 mg/dL — ABNORMAL LOW (ref 8.9–10.3)
Creatinine, Ser: 0.7 mg/dL (ref 0.61–1.24)
GFR calc Af Amer: >60 mL/min (ref >60)
GFR calc non Af Amer: >60 mL/min (ref >60)
GLUCOSE: 120 mg/dL — AB (ref 70–99)
Potassium: 5.1 mmol/L (ref 3.5–5.1)
Sodium: 139 mmol/L (ref 135–145)

## 2015-01-30 LAB — COMPREHENSIVE METABOLIC PANEL
ALBUMIN: 2.4 g/dL — AB (ref 3.5–5.0)
ALT: 27 U/L (ref 17–63)
AST: 63 U/L — ABNORMAL HIGH (ref 15–41)
Alkaline Phosphatase: 172 U/L — ABNORMAL HIGH (ref 38–126)
Anion gap: 10 (ref 5–15)
BUN: <5 mg/dL — ABNORMAL LOW (ref 6–20)
CO2: 33 mmol/L — ABNORMAL HIGH (ref 22–32)
Calcium: 8 mg/dL — ABNORMAL LOW (ref 8.9–10.3)
Chloride: 96 mmol/L — ABNORMAL LOW (ref 101–111)
Creatinine, Ser: 0.75 mg/dL (ref 0.61–1.24)
GFR calc non Af Amer: >60 mL/min (ref >60)
Glucose, Bld: 123 mg/dL — ABNORMAL HIGH (ref 70–99)
POTASSIUM: 3 mmol/L — AB (ref 3.5–5.1)
Sodium: 139 mmol/L (ref 135–145)
TOTAL PROTEIN: 8.8 g/dL — AB (ref 6.5–8.1)
Total Bilirubin: 2.7 mg/dL — ABNORMAL HIGH (ref 0.3–1.2)

## 2015-01-30 LAB — PROTEIN, BODY FLUID

## 2015-01-30 LAB — LACTATE DEHYDROGENASE, PLEURAL OR PERITONEAL FLUID: LD, Fluid: 76 U/L — ABNORMAL HIGH (ref 3–23)

## 2015-01-30 LAB — BODY FLUID CELL COUNT WITH DIFFERENTIAL
Lymphs, Fluid: 72 %
MONOCYTE-MACROPHAGE-SEROUS FLUID: 25 % — AB (ref 50–90)
Neutrophil Count, Fluid: 3 % (ref 0–25)
Total Nucleated Cell Count, Fluid: 116 cu mm (ref 0–1000)

## 2015-01-30 LAB — PROTIME-INR
INR: 1.56 — ABNORMAL HIGH (ref 0.00–1.49)
PROTHROMBIN TIME: 18.8 s — AB (ref 11.6–15.2)

## 2015-01-30 LAB — LACTATE DEHYDROGENASE: LDH: 306 U/L — ABNORMAL HIGH (ref 98–192)

## 2015-01-30 MED ORDER — LIDOCAINE HCL (PF) 1 % IJ SOLN
INTRAMUSCULAR | Status: AC
  Filled 2015-01-30: qty 10

## 2015-01-30 MED ORDER — FUROSEMIDE 40 MG PO TABS
40.0000 mg | ORAL_TABLET | Freq: Once | ORAL | Status: AC
  Administered 2015-01-30: 40 mg via ORAL
  Filled 2015-01-30: qty 1

## 2015-01-30 MED ORDER — POTASSIUM CHLORIDE CRYS ER 20 MEQ PO TBCR
40.0000 meq | EXTENDED_RELEASE_TABLET | Freq: Two times a day (BID) | ORAL | Status: DC
  Administered 2015-01-30 – 2015-02-01 (×5): 40 meq via ORAL
  Filled 2015-01-30 (×6): qty 2

## 2015-01-30 NOTE — Progress Notes (Signed)
01/30/2015 1400 Post void residual completed showing no residual urine in bladder. Correct positioning of scanner difficult to place due to significant abdominal lymphedema. Verbal order from MD also obtained to hold 2pm doing of SQ heparin. Pt. Updated on plan of care. Will continue to monitor patient.  Coston Mandato, Arville Lime

## 2015-01-30 NOTE — Progress Notes (Signed)
Family Medicine Teaching Service Daily Progress Note Intern Pager: [phone]  Patient name: Matthew [name] Medical record number: [id number] Date of birth: Sep 06, 1956 Age: 59 y.o. Gender: male  Primary Care Provider: Lorna Few, DO Consultants: None Code Status: Full  Pt Overview and Major Events to Date:  5/2 - Admitted with dyspnea  Assessment and Plan: Matthew [name] is a 59 y.o. male presenting with SOB x 5 days. PMH is significant for afib, HTN, chronic right pleural effusion, ?CHF, allergies  # SOB: Not requiring oxygen. CXR significant for right pleural effusion (present for the past year s/p ICU admission - appears to be increasing in size). Uncontrolled afib may be contributing, though patient's HR only into the low 100s. Also possibly some component of CHF exacerbation (though last echo in 2015 was EF 65-70% and unable to assess diastolic dysfunction) with BNP mildly elevated 290. Of note patient has lost about 40-50 pounds over the last year (actively trying to lose weight). Improving s/p 2 doses of lasix. - O2 to keep sat >90%  - S/p thoracocentesis 1.1 L removed, pleural LDH 76, serum LDH 306, pleural protein <3 - Echocardiogram pending  # Bacteriuria: UA with positive nitrites and many bacteria. No current symptoms, though endorses infrequent dysuria within the past 2 weeks.  -  urine cx >100,000 gram + cocci, follow speciation - Cipro (5/3- ) - PVR 0 yesterday with good UOP 1050 overnight  # Afib: has been off eliquis due to finances. Was on warfarin in the past as well but did not like blood monitoring. CHADSVASC 1 (2 if patient has CHF). - consult to care management for medication assistance - diltiazem restarted (will start at 4 time daily dosing, was previously on 24hr 240mg ) - Echocardiogram pending - Consider anticoagulation pending echocardiogram results  # HTN: initially hypertensive in ED with some intermittent low readings - diltiazem as above -  Restart home benazepril as appropriate  # Cirrhosis. Based on abdominal CT in 2015. AST 63, ALT 27, consistent with alcohol abuse. Also likely has element of fatty liver disease. INR elevated to 1.56.  - Per prior DC summary, attempted to have abdominal MRI while inpatient, however MRI could not accommodate patient's height.  - Will need outpatient evaluation.   # Psych. Patient with suicide attempt last year. Also with history of alcohol abuse - Consider starting SSRI or psychotherapy. Per pt preference - CIWA protocol - Thiamine daily  # PT/OT eval prior to discharge  #FEN/GI: diet heart healthy / saline lock #Prophylaxis: heparin  Disposition: Admitted pending above evaluation.   Subjective:  Breathing much better this AM after thoracocentesis. Improved LE edema  Objective: Temp:  [98.4 F (36.9 C)-99.6 F (37.6 C)] 99.6 F (37.6 C) (05/03 1600) Pulse Rate:  [95-99] 95 (05/03 1600) Resp:  [19-22] 20 (05/03 1600) BP: (123-149)/(53-75) 133/59 mmHg (05/03 1600) SpO2:  [95 %-97 %] 97 % (05/03 1600) Physical Exam: General: NAD, morbidly obese gentleman HEENT: PERRL, EOMI. MMM. Cardiovascular: Irregularly irregular, no murmurs appreciated. Respiratory: NWOB, decreased breath sounds RLL, otherwise CTAB Abdomen: obese, soft, nontender, normal bowel sounds Extremities: 2+ edema up to knees bilaterally Skin: chronic venous stasis changes LE Neuro: alert and oriented, no focal deficits  Laboratory/Imaging:  Recent Labs Lab 01/29/15 1615  WBC 7.7  HGB 14.8  HCT 42.4  PLT 169    Recent Labs Lab 01/29/15 1615 01/30/15 0517 01/30/15 1155  NA 145 139 139  K 3.4* 5.1 3.0*  CL 107 97* 96*  CO2 29 35* 33*  BUN <5* <5* <5*  CREATININE 0.63 0.70 0.75  CALCIUM 7.9* 7.8* 8.0*  PROT  --   --  8.8*  BILITOT  --   --  2.7*  ALKPHOS  --   --  172*  ALT  --   --  27  AST  --   --  63*  GLUCOSE 114* 120* 123*    Veatrice Bourbon, MD 01/30/2015, 9:35 PM PGY-1, [place] Intern pager: [phone], text pages welcome

## 2015-01-30 NOTE — Procedures (Signed)
   US guided R thoracentesis  1.1 liter yellow fluid  Pt tolerated well  cxr pending

## 2015-01-30 NOTE — Progress Notes (Signed)
Family Medicine Teaching Service Daily Progress Note Intern Pager: [phone]  Patient name: Matthew [name] Medical record number: [id number] Date of birth: Dec 11, 1955 Age: 59 y.o. Gender: male  Primary Care Provider: Lorna Few, DO Consultants: None Code Status: Full  Pt Overview and Major Events to Date:  5/2 - Admitted with dyspnea  Assessment and Plan: Matthew [name] is a 59 y.o. male presenting with SOB x 5 days. PMH is significant for afib, HTN, chronic right pleural effusion, ?CHF, allergies  # SOB: Not requiring oxygen. CXR significant for right pleural effusion (present for the past year s/p ICU admission - appears to be increasing in size). Uncontrolled afib may be contributing, though patient's HR only into the low 100s. Also possibly some component of CHF exacerbation (though last echo in 2015 was EF 65-70% and unable to assess diastolic dysfunction) with BNP mildly elevated 290. Of note patient has lost about 40-50 pounds over the last year (actively trying to lose weight). Improving s/p 2 doses of lasix. - O2 to keep sat >90%  - Will obtain diagnostic thoracentesis via IR, appreciate assistance - Echocardiogram pending  # Bacteriuria: UA with positive nitrites and many bacteria. No current symptoms, though endorses infrequent dysuria within the past 2 weeks.  - f/u urine cx - Cipro (5/3- ) - f/u post void bladder scan to rule out incomplete emptying.  # Afib: has been off eliquis due to finances. Was on warfarin in the past as well but did not like blood monitoring. CHADSVASC 1 (2 if patient has CHF). - consult to care management for medication assistance - restart diltiazem (will start at 4 time daily dosing, was previously on 24hr 240mg ) - Echocardiogram pending - Consider anticoagulation pending echocardiogram results  # HTN: initially hypertensive in ED with some intermittent low readings - diltiazem as above - Restart home benazepril as appropriate  #  Cirrhosis. Based on abdominal CT in 2015. AST 63, ALT 27, consistent with alcohol abuse. Also likely has element of fatty liver disease. INR elevated to 1.56.  - Per prior DC summary, attempted to have abdominal MRI while inpatient, however MRI could not accommodate patient's height.  - Will need outpatient evaluation.   # Psych. Patient with suicide attempt last year. Also with history of alcohol abuse - Consider starting SSRI or psychotherapy - CIWA protocol - Thiamine daily  FEN/GI: diet heart healthy / saline lock Prophylaxis: heparin  Disposition: Admitted pending above evaluation.   Subjective:  Breathing better this morning. States he only had infrequent dysuria. No fevers or chills. No malaise.   Objective: Temp:  [98.6 F (37 C)] 98.6 F (37 C) (05/03 0500) Pulse Rate:  [88-100] 97 (05/03 0500) Resp:  [14-19] 19 (05/03 0500) BP: (102-168)/(64-91) 132/75 mmHg (05/03 0500) SpO2:  [94 %-99 %] 95 % (05/03 0500) Physical Exam: General: NAD, morbidly obese gentleman HEENT: PERRL, EOMI. MMM. Cardiovascular: Irregularly irregular, no murmurs appreciated. Respiratory: NWOB, decreased breath sounds RLL, otherwise CTAB Abdomen: obese, soft, nontender, normal bowel sounds Extremities: 2+ edema up to knees bilaterally Skin: chronic venous stasis changes LE Neuro: alert and oriented, no focal deficits  Laboratory/Imaging:  Recent Labs Lab 01/29/15 1615  WBC 7.7  HGB 14.8  HCT 42.4  PLT 169    Recent Labs Lab 01/29/15 1615 01/30/15 0517  NA 145 139  K 3.4* 5.1  CL 107 97*  CO2 29 35*  BUN <5* <5*  CREATININE 0.63 0.70  CALCIUM 7.9* 7.8*  GLUCOSE 114* 120*  Vivi Barrack, MD 01/30/2015, 7:22 AM PGY-1, [place] Intern pager: [phone], text pages welcome

## 2015-01-30 NOTE — Care Management Note (Signed)
Case Management Note  Patient Details  Name: Matthew [name] MRN: [id number] Date of Birth: 1956/03/02  Subjective/Objective:  Pt admitted with SOB                  Action/Plan:  PTA pt lived at home- has PCP with [facility]pt [facility]- does not currently have insurance   Expected Discharge Date:  01/31/15               Expected Discharge Plan:  Home/Self Care  In-House Referral:  NA  Discharge planning Services  CM Consult, Medication Assistance  Post Acute Care Choice:    Choice offered to:     DME Arranged:    DME Agency:     HH Arranged:    [place] Agency:     Status of Service:  In process, will continue to follow  Medicare Important Message Given:    Date Medicare IM Given:    Medicare IM give by:    Date Additional Medicare IM Given:    Additional Medicare Important Message give by:     If discussed at [place] of Stay Meetings, dates discussed:    Additional Comments: Spoke with pt at bedside- pt has been using [employer] but currently owes them money- discussed possibly using [facility]- reviewed pt's med list with pt- some meds are on [employer] $4 list some are low cost (under $10) at [employer]- ? Need for abx at discharge- discussed with pt MATCH program if needed- will relook at pt's meds once discharge meds confirmed- and make final desicion on if MATCH will be needed. NCM to f/u prior to discharge  Dawayne Patricia, RN 01/30/2015, 4:18 PM

## 2015-01-31 ENCOUNTER — Encounter (HOSPITAL_COMMUNITY): Payer: Self-pay | Admitting: General Practice

## 2015-01-31 ENCOUNTER — Observation Stay (HOSPITAL_COMMUNITY): Payer: Medicaid Other

## 2015-01-31 DIAGNOSIS — I4891 Unspecified atrial fibrillation: Secondary | ICD-10-CM | POA: Diagnosis not present

## 2015-01-31 DIAGNOSIS — I10 Essential (primary) hypertension: Secondary | ICD-10-CM

## 2015-01-31 DIAGNOSIS — J90 Pleural effusion, not elsewhere classified: Secondary | ICD-10-CM

## 2015-01-31 DIAGNOSIS — F1099 Alcohol use, unspecified with unspecified alcohol-induced disorder: Secondary | ICD-10-CM

## 2015-01-31 DIAGNOSIS — R0602 Shortness of breath: Secondary | ICD-10-CM

## 2015-01-31 DIAGNOSIS — R0600 Dyspnea, unspecified: Secondary | ICD-10-CM | POA: Diagnosis not present

## 2015-01-31 DIAGNOSIS — N390 Urinary tract infection, site not specified: Secondary | ICD-10-CM

## 2015-01-31 LAB — BASIC METABOLIC PANEL
Anion gap: 12 (ref 5–15)
BUN: 5 mg/dL — ABNORMAL LOW (ref 6–20)
CALCIUM: 7.9 mg/dL — AB (ref 8.9–10.3)
CO2: 29 mmol/L (ref 22–32)
Chloride: 98 mmol/L — ABNORMAL LOW (ref 101–111)
Creatinine, Ser: 0.69 mg/dL (ref 0.61–1.24)
GFR calc Af Amer: >60 mL/min (ref >60)
GFR calc non Af Amer: >60 mL/min (ref >60)
Glucose, Bld: 97 mg/dL (ref 70–99)
Potassium: 3.5 mmol/L (ref 3.5–5.1)
SODIUM: 139 mmol/L (ref 135–145)

## 2015-01-31 LAB — PH, BODY FLUID: PH, FLUID: 7.5

## 2015-01-31 LAB — CBC
HCT: 42.2 % (ref 39.0–52.0)
Hemoglobin: 14.7 g/dL (ref 13.0–17.0)
MCH: 33.6 pg (ref 26.0–34.0)
MCHC: 34.8 g/dL (ref 30.0–36.0)
MCV: 96.6 fL (ref 78.0–100.0)
Platelets: 179 10*3/uL (ref 150–400)
RBC: 4.37 MIL/uL (ref 4.22–5.81)
RDW: 17.1 % — AB (ref 11.5–15.5)
WBC: 9.4 10*3/uL (ref 4.0–10.5)

## 2015-01-31 LAB — PATHOLOGIST SMEAR REVIEW

## 2015-01-31 MED ORDER — FUROSEMIDE 20 MG PO TABS
20.0000 mg | ORAL_TABLET | Freq: Every day | ORAL | Status: DC
  Administered 2015-01-31 – 2015-02-01 (×2): 20 mg via ORAL
  Filled 2015-01-31 (×3): qty 1

## 2015-01-31 NOTE — Progress Notes (Signed)
UR completed 

## 2015-01-31 NOTE — Progress Notes (Signed)
*  PRELIMINARY RESULTS* Echocardiogram 2D Echocardiogram has been performed.  Matthew [name] 01/31/2015, 11:58 AM

## 2015-02-01 DIAGNOSIS — R0602 Shortness of breath: Secondary | ICD-10-CM | POA: Diagnosis not present

## 2015-02-01 DIAGNOSIS — F1099 Alcohol use, unspecified with unspecified alcohol-induced disorder: Secondary | ICD-10-CM | POA: Diagnosis not present

## 2015-02-01 DIAGNOSIS — I4891 Unspecified atrial fibrillation: Secondary | ICD-10-CM | POA: Diagnosis not present

## 2015-02-01 DIAGNOSIS — R0600 Dyspnea, unspecified: Secondary | ICD-10-CM | POA: Diagnosis not present

## 2015-02-01 MED ORDER — DILTIAZEM HCL ER BEADS 240 MG PO CP24: 240.0000 mg | ORAL_CAPSULE | Freq: Every day | ORAL | Status: DC

## 2015-02-01 MED ORDER — DILTIAZEM HCL ER COATED BEADS 120 MG PO CP24
120.0000 mg | ORAL_CAPSULE | Freq: Every day | ORAL | Status: DC
  Administered 2015-02-01: 120 mg via ORAL
  Filled 2015-02-01: qty 1

## 2015-02-01 MED ORDER — ASPIRIN 81 MG PO TBEC: 81.0000 mg | DELAYED_RELEASE_TABLET | Freq: Every day | ORAL | Status: DC

## 2015-02-01 MED ORDER — SPIRONOLACTONE 50 MG PO TABS: 50.0000 mg | ORAL_TABLET | Freq: Every day | ORAL | Status: DC

## 2015-02-01 MED ORDER — NITROFURANTOIN MONOHYD MACRO 100 MG PO CAPS: 100.0000 mg | ORAL_CAPSULE | Freq: Two times a day (BID) | ORAL | Status: DC

## 2015-02-01 NOTE — Progress Notes (Signed)
Family Medicine Teaching Service Daily Progress Note Intern Pager: [phone]  Patient name: Matthew [name] Medical record number: [id number] Date of birth: January 23, 1956 Age: 59 y.o. Gender: male  Primary Care Provider: Lorna Few, DO Consultants: None Code Status: Full  Pt Overview and Major Events to Date:  5/2 - Admitted with dyspnea  Assessment and Plan: Matthew [name] is a 59 y.o. male presenting with SOB x 5 days. PMH is significant for afib, HTN, chronic right pleural effusion, ?CHF, allergies  # SOB/Pleural Effusion: Not requiring oxygen. CXR significant for right pleural effusion (present for the past year s/p ICU admission - appears to be increasing in size). Uncontrolled afib may be contributing, though patient's HR only into the low 100s. Also possibly some component of CHF exacerbation (though last echo in 2015 was EF 65-70% and unable to assess diastolic dysfunction) with BNP mildly elevated 290. Of note patient has lost about 40-50 pounds over the last year (actively trying to lose weight). Improved s/p thoracentesis.  - O2 to keep sat >90%  - S/p thoracocentesis 1.1 L removed, pleural LDH 76, serum LDH 306, pleural protein <3, consistent with transudative effusion - Echocardiogram with normal EF - Transudative effusion likely secondary to cirrhosis - Lasix 20mg  PO daily - Consider starting spironolactone - Consider urine protein:creatinine to rule out nephrotic syndrome  # Bacteriuria: UA with positive nitrites and many bacteria. No current symptoms, though endorses infrequent dysuria within the past 2 weeks. Post void residual of 0.  - urine cx >100,000 gram + cocci, follow speciation - Cipro (5/3- )  # Afib: has been off eliquis due to finances. Was on warfarin in the past as well but did not like blood monitoring. CHADSVASC 1 (2 if patient has CHF). - consult to care management for medication assistance - Start diltiazem 120mg  daily - Aspirin daily  # HTN:  initially hypertensive in ED with some intermittent low readings - diltiazem as above - Restart home benazepril as appropriate  # Cirrhosis. Based on abdominal CT in 2015. AST 63, ALT 27, consistent with alcohol abuse. Also likely has element of fatty liver disease. INR elevated to 1.56.  - Per prior DC summary, attempted to have abdominal MRI while inpatient, however MRI could not accommodate patient's height.  - Will need outpatient evaluation.   # Psych. Patient with suicide attempt last year. Also with history of alcohol abuse - Consider starting SSRI or psychotherapy. Per pt preference - CIWA protocol - Thiamine daily  # PT/OT eval prior to discharge  #FEN/GI: diet heart healthy / saline lock #Prophylaxis: heparin  Disposition: Admitted pending above evaluation.   Subjective:  Doing well this morning. Shortness of breath much improved.   Objective: Temp:  [98 F (36.7 C)-98.7 F (37.1 C)] 98 F (36.7 C) (05/05 0651) Pulse Rate:  [84-92] 84 (05/05 0651) Resp:  [16-18] 16 (05/05 0651) BP: (112-126)/(50-65) 122/50 mmHg (05/05 0651) SpO2:  [93 %-97 %] 93 % (05/05 0651) Weight:  [363 lb 3.2 oz (164.746 kg)] 363 lb 3.2 oz (164.746 kg) (05/05 0762) Physical Exam: General: NAD, morbidly obese gentleman HEENT: PERRL, EOMI. MMM. Cardiovascular: Irregularly irregular, no murmurs appreciated. Respiratory: NWOB, decreased breath sounds RLL, otherwise CTAB Abdomen: obese, soft, nontender, normal bowel sounds Extremities: 2+ edema up to knees bilaterally Skin: chronic venous stasis changes LE Neuro: alert and oriented, no focal deficits  Laboratory/Imaging:  Recent Labs Lab 01/29/15 1615 01/31/15 0400  WBC 7.7 9.4  HGB 14.8 14.7  HCT 42.4 42.2  PLT 169 179    Recent Labs Lab 01/30/15 0517 01/30/15 1155 01/31/15 0400  NA 139 139 139  K 5.1 3.0* 3.5  CL 97* 96* 98*  CO2 35* 33* 29  BUN <5* <5* 5*  CREATININE 0.70 0.75 0.69  CALCIUM 7.8* 8.0* 7.9*  PROT  --  8.8*   --   BILITOT  --  2.7*  --   ALKPHOS  --  172*  --   ALT  --  27  --   AST  --  63*  --   GLUCOSE 120* 123* 97   Urine culture: GPCs  Echo Study Conclusions  - Left ventricle: wall motion cannot be assessed due to poor visualization of myocardia segments. Recommend limited study with definity contrast. The cavity size was normal. Systolic function was normal. The estimated ejection fraction was in the range of 55% to 60%. Images were inadequate for LV wall motion assessment. - Aortic valve: Poorly visualized. Moderate diffuse thickening and calcification. - Left atrium: The atrium was mildly dilated.   Vivi Barrack, MD 02/01/2015, 7:26 AM PGY-1, [place] Intern pager: [phone], text pages welcome

## 2015-02-01 NOTE — Discharge Instructions (Signed)
You were admitted to the hospital with shortness of breath. While here we took fluid off of your lungs. We also found that you have a urinary tract infection. It is important that you continue to take your antibiotics for the full course. We also started you on a new medication called spironolactone to prevent fluid from building back up. You should also be taking an aspirin everyday.  Pleural Effusion The lining covering your lungs and the inside of your chest is called the pleura. Usually, the space between the two pleura contains no air and only a thin layer of fluid. A pleural effusion is an abnormal buildup of fluid in the pleural space. Fluid gathers when there is increased pressure in the lung vessels. This forces fluids out of the lungs and into the pleural space. Vessels may also leak fluids when there are infections, such as pneumonia, or other causes of soreness and redness (inflammation). Fluids leak into the lungs when protein in the blood is low or when certain vessels (lymphatics) are blocked. Finding a pleural effusion is important because it is usually caused by another disease. In order to treat a pleural effusion, your health care provider needs to find its cause. If left untreated, a large amount of fluid can build up and cause collapse of the lung. CAUSES   Heart failure.  Infections (pneumonia, tuberculosis), pulmonary embolism, pulmonary infarction.  Cancer (primary lung and metastatic), asbestosis.  Liver failure (cirrhosis).  Nephrotic syndrome, peritoneal dialysis, kidney problems (uremia).  Collagen vascular disease (systemic lupus erythematosus, rheumatoid arthritis).  Injury (trauma) to the chest or rupture of the digestive tube (esophagus).  Material in the chest or pleural space (hemothorax, chylothorax).  Pancreatitis.  Surgery.  Drug reactions. SYMPTOMS  A pleural effusion can decrease the amount of space available for breathing and make you short of  breath. The fluid can become infected, which may cause pain and fever. Often, the pain is worse when taking a deep breath. The underlying disease (heart failure, pneumonia, blood clot, tuberculosis, cancer) may also cause symptoms. DIAGNOSIS   Your health care provider can usually tell what is wrong by talking to you (taking a history), doing an exam, and taking a routine X-ray. If the X-ray shows fluid in your chest, often fluid is removed from your chest with a needle for testing (diagnostic thoracentesis).  Sometimes, more specialized X-rays may be needed.  Sometimes, a small piece of tissue is removed and examined by a specialist (biopsy). TREATMENT  Treatment varies based on what caused the pleural effusion. Treatments include:  Removing as much fluid as possible using a needle (thoracentesis) to improve the cough and shortness of breath. This is a simple procedure that can be done at bedside. The risks are bleeding, infection, collapse of a lung, or low blood pressure.  Placing a tube in the chest to drain the effusion (tube thoracostomy). This is often used when there is an infection in the fluid. This is a simple procedure that can often be done at bedside or in a clinic. The procedure may be painful. The risks are the same as using a needle to drain the fluid. The chest tube usually remains for a few days and is connected to suction to improve fluid drainage. After placement, the tube usually does not cause much discomfort.  Surgical removal of fibrous debris in and around the pleural space (decortication). This may be done with a flexible telescope (thoracoscope) through a small or large cut (incision). This is helpful  for patients who have fibrosis or scar tissue that prevents complete lung expansion. The risks are infection, blood loss, and side effects from general anesthesia.  Sometimes, a procedure called pleurodesis is done. A chest tube is placed and the fluid is drained. Next, an  agent (tetracycline, talc powder) is added to the pleural space. This causes the lung and chest wall to stick together (adhesion). This leaves no potential space for fluid to build up. The risks include infection, blood loss, and side effects from general anesthesia.  If the effusion is caused by infection, it may be treated with antibiotics and may improve without draining. HOME CARE INSTRUCTIONS   Take any medicines exactly as prescribed.  Follow up with your health care provider as directed.  Monitor your exercise capacity (the amount of walking you can do before you get short of breath).  Do not use any tobacco products including cigarettes, chewing tobacco, or electronic cigarettes. SEEK MEDICAL CARE IF:   Your exercise capacity seems to get worse or does not improve with time.  You do not recover from your illness.  You have drainage, redness, swelling, or pain at any incision or puncture sites. SEEK IMMEDIATE MEDICAL CARE IF:   Shortness of breath or chest pain develops or gets worse.  You have a fever.  You develop a new cough, especially if the mucus (phlegm) is discolored. MAKE SURE YOU:   Understand these instructions.  Will watch your condition.  Will get help right away if you are not doing well or get worse. Document Released: 09/15/2005 Document Revised: 01/30/2014 Document Reviewed: 05/07/2007 [facility] Patient Information 2015 [place], [place]. This information is not intended to replace advice given to you by your health care provider. Make sure you discuss any questions you have with your health care provider.   Urinary Tract Infection A urinary tract infection (UTI) can occur any place along the urinary tract. The tract includes the kidneys, ureters, bladder, and urethra. A type of germ called bacteria often causes a UTI. UTIs are often helped with antibiotic medicine.  HOME CARE   If given, take antibiotics as told by your doctor. Finish them even if you  start to feel better.  Drink enough fluids to keep your pee (urine) clear or pale yellow.  Avoid tea, drinks with caffeine, and bubbly (carbonated) drinks.  Pee often. Avoid holding your pee in for a long time.  Pee before and after having sex (intercourse).  Wipe from front to back after you poop (bowel movement) if you are a woman. Use each tissue only once. GET HELP RIGHT AWAY IF:   You have back pain.  You have lower belly (abdominal) pain.  You have chills.  You feel sick to your stomach (nauseous).  You throw up (vomit).  Your burning or discomfort with peeing does not go away.  You have a fever.  Your symptoms are not better in 3 days. MAKE SURE YOU:   Understand these instructions.  Will watch your condition.  Will get help right away if you are not doing well or get worse. Document Released: 03/03/2008 Document Revised: 06/09/2012 Document Reviewed: 04/15/2012 [facility] Patient Information 2015 [place], [place]. This information is not intended to replace advice given to you by your health care provider. Make sure you discuss any questions you have with your health care provider.

## 2015-02-01 NOTE — Evaluation (Signed)
Physical Therapy Evaluation Patient Details Name: Matthew [name] MRN: [id number] DOB: [birth year] Today's Date: 02/01/2015   History of Present Illness  Adm with SOB; rt pleural effusion; s/p thoracentesis PMHx-morbid obesity, afib, CHF  Clinical Impression  Patient evaluated by Physical Therapy with no further acute PT needs identified. All education has been completed and the patient has no further questions. PT is signing off. Thank you for this referral.     Follow Up Recommendations No PT follow up    Equipment Recommendations  None recommended by PT    Recommendations for Other Services       Precautions / Restrictions Precautions Precautions: Fall Precaution Comments: denies h/o falls; is anxious that he could fall       Mobility  Bed Mobility Overal bed mobility: Modified Independent                Transfers Overall transfer level: Independent                  Ambulation/Gait Ambulation/Gait assistance: Independent Ambulation Distance (Feet): 200 Feet Assistive device: None Gait Pattern/deviations: WFL(Within Functional Limits)   Gait velocity interpretation: Below normal speed for age/gender General Gait Details: wide base of support due to body habitus  Stairs Stairs: Yes Stairs assistance: Min guard Stair Management: No rails;Forwards;Step to pattern Number of Stairs: 3 General stair comments: pt reported steps in foyer are not as tall as practice steps; can brace himself against wall on one side; discussed ? 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      Prior Function Level of Independence: Independent               Hand Dominance        Extremity/Trunk Assessment   Upper Extremity Assessment: Overall WFL for tasks assessed           Lower Extremity Assessment: Generalized weakness         Communication   Communication: No difficulties  Cognition Arousal/Alertness: Awake/alert Behavior During Therapy: Anxious Overall Cognitive Status: Within Functional Limits for tasks assessed                      General Comments General comments (skin integrity, edema, etc.): discussed how to do his laundry; perhaps carry it in a bag in one hand and other hand on rail (instead of laundry basket)    Exercises        Assessment/Plan    PT Assessment Patent does not need any further PT services  PT Diagnosis Generalized weakness   PT Problem List    PT Treatment Interventions     PT Goals (Current goals can be found in the Care Plan section) Acute Rehab PT Goals PT Goal Formulation: All assessment and  education complete, DC therapy    Frequency     Barriers to discharge        Co-evaluation               End of Session   Activity Tolerance: Patient limited by fatigue Patient left: in bed;with call bell/phone within reach Nurse Communication: Mobility status    Functional Assessment Tool Used: clinical judgement Functional Limitation: Mobility: Walking and moving around Mobility: Walking and Moving Around Current Status (A3557): At least 1 percent but less than 20 percent impaired, limited or restricted Mobility: Walking and Moving Around Goal Status [phone]): At least 1 percent but less than 20 percent impaired, limited or restricted Mobility: Walking and Moving Around Discharge Status [phone]): At least 1 percent but less than 20 percent impaired, limited  or restricted    Time: 6237-6283 PT Time Calculation (min) (ACUTE ONLY): 11 min   Charges:   PT Evaluation $Initial PT Evaluation Tier I: 1 Procedure     PT G Codes:   PT G-Codes **NOT FOR INPATIENT CLASS** Functional Assessment Tool Used: clinical judgement Functional Limitation: Mobility: Walking and moving around Mobility: Walking and Moving Around Current Status (T5176): At least 1 percent but less than 20 percent impaired, limited or restricted Mobility: Walking and Moving Around Goal Status [phone]): At least 1 percent but less than 20 percent impaired, limited or restricted Mobility: Walking and Moving Around Discharge Status [phone]): At least 1 percent but less than 20 percent impaired, limited or restricted    Nevelyn Mellott 02/01/2015, 2:43 PM Pager [phone]

## 2015-02-01 NOTE — Progress Notes (Signed)
F/U done with pt prior to discharge regarding medication needs- pt to d/c on Macrobid 100 mg (14 tabs), Diltiazem 240 mg daily, fluid pill- call made to [facility]- cost for these drugs- about $175 dollars- pt states he can not afford this- pt had used [facility] - Oct. 22- 2015- and did not qualify for MATCH again- however due to abx- override done for [facility]- explained to pt that this was a one time only override - pt will need to f/u with clinic regarding [place] card and [facility] program for further medication needs and assistance. Pt voices understanding- CSW provided taxi voucher for transportation home.

## 2015-02-01 NOTE — Discharge Summary (Signed)
[facility] Discharge Summary  Patient name: Matthew [name] Medical record number: [id number] Date of birth: [birth year] Age: 59 y.o. Gender: male Date of Admission: 01/29/2015  Date of Discharge: 02/01/2015 Admitting Physician: Dickie La, MD  Primary Care Provider: Lorna Few, DO Consultants: IR  Indication for Hospitalization: Shortness of breath  Discharge Diagnoses/Problem List:  Shortness of breath, UTI, Afib, HTN, Cirrhosis, Alchohol abuse  Disposition: Home  Discharge Condition: Improved  Discharge Exam:  Blood pressure 120/55, pulse 84, temperature 98 F (36.7 C), temperature source Oral, resp. rate 16, weight 363 lb 3.2 oz (164.746 kg), SpO2 93 %. General: NAD, morbidly obese gentleman HEENT: PERRL, EOMI. MMM. Cardiovascular: Irregularly irregular, no murmurs appreciated. Respiratory: NWOB, decreased breath sounds RLL, otherwise CTAB Abdomen: obese, soft, nontender, normal bowel sounds Extremities: 2+ edema up to knees bilaterally Skin: chronic venous stasis changes LE Neuro: alert and oriented, no focal deficits  Brief Hospital Course:  Matthew [name] is a 59 year old man with PMH significant for afib, HTN, chronic right pleural effusion who presented to the ED with 5 days of shortness of breath. His hospital course by problem is outlined below:  Shortness of Breath In the ED, the patient was noted to have a right pleural effusion on CXR with interval increase in size. Otherwise, he had a normal white count, no fevers, and no new oxygen requirement. We consulted IR who performed a thoracentesis which revealed a transudative effusion.  There was initially concern that the patient's shortness of breath may be due to volume overload (BNP elevated to 290), however echo revealed normal EF. We continued him on his home dose of lasix, which we changed to spironolactone at the time of discharge. Patient reported significant improvement with  dyspnea after thoracentesis and was discharged home in stable condition.  UTI Patient was noted to have a UA concerning for UTI (positive nitrites and many bacteria). He only endorsed infrequent dysuria for the 2 weeks prior to admission. We started him on ciprofloxacin. His urine culture grew gram positive cocci and we changed his antibiotic to microbid at the time of discharge. He will finish his course of oral antibiotics as an outpatient.   Social Issues Patient reported that he was unable to afford basically all of his medications prior to admission. We consulted our care management team for medication assistance. 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 On discharge, we attempted to provide the patient with affordable medications and the patient was anticipating receiving the [facility] letter from case management.   Afib Echo was performed as mentioned above which revealed preserved EF. Given his normal EF, the patient had a Chads2Vasc score of 1. We discontinued his elliquis and started him on daily aspirin. We continued his home diltiazem dose at discharge.   Cirrhosis Diagnosis based on abdominal CT in 2015. Work up at that time included negative HCV antibody. LFTs here were significant for AST 63 and ALT 27, consistent with alcohol abuse. Per prior discharge summary, patient was scheduled to have abdominal MRI for further evaluation, however the MRI machine could not accommodate the patient's size. No further investigation was performed here.  HTN We held the patient's benazepril here due to normal BP readings.   Depression / Alcohol abuse.  We started patient on CIWA. HE showed no signs of alcohol withdrawal during his stay.  Issues for Follow Up:  1. F/u financial issues - patient reported previously that he  was unable to afford medications 2. Patient will need further investigation of his abdominal lymphadenopathy, consider outpatient  MRI 3. F/u Urine culture and sensitivities - Patient discharged on nitrofurantoin for gram positive cocci UTI. Adjust antibiotics as indicated.  4. F/u volume status - discharged on spironolactone 50mg  daily - consider titrating or adding lasix back as indicated.  5. Will need BMP within 1-2 weeks after discharge due to starting spironolactone 6. F/u BPs - We held patient benazepril at discharge, however started spironolactone.  7. Consider discussing treatment for depression and continue to encourage alcohol cessation  Significant Procedures: 5/3 - Thoracentesis, 1.1L pleural fluid drained.   Significant Labs and Imaging:   Recent Labs Lab 01/29/15 1615 01/31/15 0400  WBC 7.7 9.4  HGB 14.8 14.7  HCT 42.4 42.2  PLT 169 179    Recent Labs Lab 01/29/15 1615 01/30/15 0517 01/30/15 1155 01/31/15 0400  NA 145 139 139 139  K 3.4* 5.1 3.0* 3.5  CL 107 97* 96* 98*  CO2 29 35* 33* 29  GLUCOSE 114* 120* 123* 97  BUN <5* <5* <5* 5*  CREATININE 0.63 0.70 0.75 0.69  CALCIUM 7.9* 7.8* 8.0* 7.9*  ALKPHOS  --   --  172*  --   AST  --   --  63*  --   ALT  --   --  27  --   ALBUMIN  --   --  2.4*  --    Urinalysis    Component Value Date/Time   COLORURINE AMBER* 01/29/2015 1547   APPEARANCEUR CLOUDY* 01/29/2015 1547   LABSPEC 1.014 01/29/2015 1547   PHURINE 6.5 01/29/2015 1547   GLUCOSEU NEGATIVE 01/29/2015 1547   HGBUR LARGE* 01/29/2015 1547   BILIRUBINUR NEGATIVE 01/29/2015 1547   KETONESUR NEGATIVE 01/29/2015 1547   PROTEINUR 100* 01/29/2015 1547   UROBILINOGEN 1.0 01/29/2015 1547   NITRITE POSITIVE* 01/29/2015 1547   LEUKOCYTESUR NEGATIVE 01/29/2015 1547   Urine Culture: GPCs  pleural LDH 76, serum LDH 306, pleural protein <3, serum protein 8.8  INR 1.56  Echo Study Conclusions  - Left ventricle: wall motion cannot be assessed due to poor visualization of myocardia segments. Recommend limited study with definity contrast. The cavity size was normal. Systolic  function was normal. The estimated ejection fraction was in the range of 55% to 60%. Images were inadequate for LV wall motion assessment. - Aortic valve: Poorly visualized. Moderate diffuse thickening and calcification. - Left atrium: The atrium was mildly dilated.  Dg Chest 1 View  01/30/2015   CLINICAL DATA:  Status post right thoracentesis  EXAM: CHEST  1 VIEW  COMPARISON:  Jan 29, 2015  FINDINGS: Right pleural effusion is much smaller following thoracentesis. No pneumothorax. There is still residual effusion on the right with right base atelectasis. There is underlying emphysema. The left lung is clear. Heart is mildly enlarged with pulmonary vascularity within normal limits.  IMPRESSION: No pneumothorax. Small residual right effusion with right base atelectasis. Left lung clear. No change in cardiac silhouette. Underlying emphysema.   Electronically Signed   By: Lowella Grip III M.D.   On: 01/30/2015 15:43   Dg Chest 2 View  01/29/2015   CLINICAL DATA:  Shortness of breath for 3-5 days.  EXAM: CHEST  2 VIEW  COMPARISON:  PA and lateral chest 05/10/2014, 12/26/2013 and 03/18/2014. CT chest 01/19/2014.  FINDINGS: Right pleural effusion seen on the prior exams is again identified and has increased since the most recent study. No left pleural effusion  is seen. There is right basilar airspace disease in association with the patient's effusion. No pneumothorax is identified. Heart size is upper normal.  IMPRESSION: Chronic right pleural effusion and basilar airspace disease persist. The effusion has increased since the most recent examination.   Electronically Signed   By: Inge Rise M.D.   On: 01/29/2015 16:51   US Thoracentesis Asp Pleural Space W/img Guide  01/30/2015   INDICATION: Symptomatic R sided pleural effusion  EXAM: US THORACENTESIS ASP PLEURAL SPACE W/IMG GUIDE  COMPARISON:  None.  MEDICATIONS: 10 cc 1% lidocaine  COMPLICATIONS: None immediate  TECHNIQUE: Informed written  consent was obtained from the patient after a discussion of the risks, benefits and alternatives to treatment. A timeout was performed prior to the initiation of the procedure.  Initial ultrasound scanning demonstrates a right pleural effusion. The lower chest was prepped and draped in the usual sterile fashion. 1% lidocaine was used for local anesthesia.  Under direct ultrasound guidance, a 19 gauge, 7-cm, Yueh catheter was introduced. An ultrasound image was saved for documentation purposes. The thoracentesis was performed. The catheter was removed and a dressing was applied. The patient tolerated the procedure well without immediate post procedural complication. The patient was escorted to have an upright chest radiograph.  FINDINGS: A total of approximately 1.1 liters of yellow fluid was removed. Requested samples were sent to the laboratory.  IMPRESSION: Successful ultrasound-guided R sided thoracentesis yielding 1.1 liters of pleural fluid.  Read by:  Lavonia Drafts [facility]   Electronically Signed   By: Jerilynn Mages.  Shick M.D.   On: 01/30/2015 15:35     Results/Tests Pending at Time of Discharge: Urine culture final speciation and sensitivities.   Discharge Medications:    Medication List    STOP taking these medications        benazepril 10 MG tablet  Commonly known as:  LOTENSIN     cephALEXin 500 MG capsule  Commonly known as:  KEFLEX     furosemide 20 MG tablet  Commonly known as:  LASIX      TAKE these medications        aspirin 81 MG EC tablet  Take 1 tablet (81 mg total) by mouth daily.     diltiazem 240 MG 24 hr capsule  Commonly known as:  TIAZAC  Take 1 capsule (240 mg total) by mouth daily.     famotidine 20 MG tablet  Commonly known as:  PEPCID  Take 1 tablet (20 mg total) by mouth 2 (two) times daily.     folic acid 1 MG tablet  Commonly known as:  FOLVITE  Take 1 tablet (1 mg total) by mouth daily.     multivitamin with minerals Tabs tablet  Take 1 tablet by mouth  daily.     nitrofurantoin (macrocrystal-monohydrate) 100 MG capsule  Commonly known as:  MACROBID  Take 1 capsule (100 mg total) by mouth 2 (two) times daily.     spironolactone 50 MG tablet  Commonly known as:  ALDACTONE  Take 1 tablet (50 mg total) by mouth daily.     thiamine 100 MG tablet  Take 1 tablet (100 mg total) by mouth daily.        Discharge Instructions: Please refer to Patient Instructions section of EMR for full details.  Patient was counseled important signs and symptoms that should prompt return to medical care, changes in medications, dietary instructions, activity restrictions, and follow up appointments.   Follow-Up Appointments: Follow-up Information  Follow up with Thersa Salt, DO On 02/05/2015.   Specialty:  Family Medicine   Why:  1:45pm   Contact information:   [place] [address] [phone]       Vivi Barrack, MD 02/01/2015, 1:20 PM PGY-1, [place]

## 2015-02-01 NOTE — Progress Notes (Signed)
Patient was educated on new medications, handouts given. Educated on PE and UTIs signs and symptoms. D/C packet given. IV removed. Tele box removed. Taxi voucher given and a taxi was called. Wheeled to the ER to meet the taxi with personal belongings.   Domingo Dimes RN

## 2015-02-02 ENCOUNTER — Other Ambulatory Visit: Payer: Self-pay | Admitting: Family Medicine

## 2015-02-02 DIAGNOSIS — I4891 Unspecified atrial fibrillation: Secondary | ICD-10-CM

## 2015-02-02 MED ORDER — DILTIAZEM HCL ER COATED BEADS 240 MG PO TB24: 240.0000 mg | ORAL_TABLET | Freq: Every day | ORAL | Status: DC

## 2015-02-02 NOTE — Telephone Encounter (Signed)
Placed order for generic Cardizem due to supply demands at [facility] program.

## 2015-02-03 LAB — URINE CULTURE
Colony Count: >=100000
SPECIAL REQUESTS: NORMAL

## 2015-02-05 ENCOUNTER — Ambulatory Visit (INDEPENDENT_AMBULATORY_CARE_PROVIDER_SITE_OTHER): Payer: Medicaid Other | Admitting: Family Medicine

## 2015-02-05 VITALS — BP 110/75 | HR 80 | Temp 98.2°F | Wt 265.3 lb

## 2015-02-05 DIAGNOSIS — R0600 Dyspnea, unspecified: Secondary | ICD-10-CM

## 2015-02-05 DIAGNOSIS — I10 Essential (primary) hypertension: Secondary | ICD-10-CM | POA: Diagnosis not present

## 2015-02-05 DIAGNOSIS — N3000 Acute cystitis without hematuria: Secondary | ICD-10-CM

## 2015-02-05 DIAGNOSIS — N390 Urinary tract infection, site not specified: Secondary | ICD-10-CM | POA: Insufficient documentation

## 2015-02-05 MED ORDER — FOLIC ACID 1 MG PO TABS: 1.0000 mg | ORAL_TABLET | Freq: Every day | ORAL | Status: DC

## 2015-02-05 NOTE — Assessment & Plan Note (Signed)
Doing well. Will finish Macrobid course.

## 2015-02-05 NOTE — Progress Notes (Signed)
   Subjective:    Patient ID: Matthew [name], male    DOB: December 29, 1955, 59 y.o.   MRN: [id number]  HPI 59 year old male with PAF, DM-2, Chronic alcohol abuse, Cirrhosis, Morbid obesity, and HTN presents for hospital follow up.  1) Hospital follow up  Patient admitted from 5/2 - 5/5.  Hospital course reviewed and summarized as below:  Admitted for SOB.   Chest xray revealed increased R pleural effusion.  Thoracentesis was done and was consistent with a transudative effusion.  Patient had drastic improvement with this.  Additionally, patient was found to have a UTI.  His culture revealed Strep viridans (no sensitivities reported).  He is finishing treatment with Macrobid.  He reports that he is doing well at this time.  SOB improved as well as urinary complaints.  Dyspnea - Improved and stable. - He states that he has been able to climb stairs with little difficulty. - No recent cough, fever, chills. - No current SOB.  UTI - Resolving. - Denies symptoms. Continuing antibiotics.  HTN - Stable.  - Home BP Monitoring - No. - Medications - Spironolactone. - Compliance -  Yes.    Social Hx - Former smoker. Hx of alcohol abuse.   Review of Systems  Constitutional: Negative for fever and chills.  Respiratory:       SOB improved.   Genitourinary: Negative for dysuria, urgency and frequency.      Objective:   Physical Exam Filed Vitals:   02/05/15 1355  BP: 110/75  Pulse: 80  Temp: 98.2 F (36.8 C)   Vital signs reviewed.  Exam: General: well appearing obese male in NAD. Cardiovascular: RRR. No murmur noted.  Respiratory: Slightly decreased breath sounds on the R lung base; Otherwise CTAB.  Abdomen: soft, nontender, nondistended. Extremities: 1+ LE edema with chronic venous stasis changes.     Assessment & Plan:  See Problem List

## 2015-02-05 NOTE — Assessment & Plan Note (Signed)
Well controlled currently. Patient to follow up for BMP in ~ 1 week given recent start of Spironolactone.

## 2015-02-05 NOTE — Patient Instructions (Addendum)
It was nice to see you today.  Continue all of your medications as prescribed.  Follow up in ~ 1 week for blood draw.   Please follow up closely with your PCP.  Take care  Dr. Lacinda Axon

## 2015-02-05 NOTE — Assessment & Plan Note (Signed)
Stable.  Will continue to monitor.   Advised continued abstinence from alcohol. Also advised weight loss.

## 2015-02-07 ENCOUNTER — Telehealth: Payer: Self-pay | Admitting: Family Medicine

## 2015-02-07 NOTE — Telephone Encounter (Signed)
Sinus are about to kill me Can an antibotic be called in? [facility] on [address]?

## 2015-02-08 MED ORDER — CETIRIZINE HCL 10 MG PO TABS: 10.0000 mg | ORAL_TABLET | Freq: Every day | ORAL | Status: DC

## 2015-02-08 MED ORDER — FLUTICASONE PROPIONATE 50 MCG/ACT NA SUSP: 2.0000 | Freq: Every day | NASAL | Status: DC

## 2015-02-08 NOTE — Telephone Encounter (Signed)
I have sent in prescriptions for Flonase and Cetirizine. He may see if it is cheaper to buy Zyrtec (Cetirizine) with a prescription or over the counter. If he does not improve, he may schedule an appointment and antibiotics will be considered at that time.  Dr. Gerlean Ren

## 2015-02-09 MED ORDER — DOXYCYCLINE HYCLATE 100 MG PO TABS: 100.0000 mg | ORAL_TABLET | Freq: Two times a day (BID) | ORAL | Status: DC

## 2015-02-09 NOTE — Telephone Encounter (Signed)
Patient states that he has taken both of these medications before with no relief. States that he suffers from the sinus infections every year at this time.  He doesn't have any insurance right now and is waiting to hear back from financial assistance regarding approval.  He wanted to make [place] aware that he was just released from hospital with CHF and doesn't want this to turn into bronchitis. Jazmin Hartsell,CMA

## 2015-02-09 NOTE — Telephone Encounter (Signed)
I have sent in a prescription for Doxycycline 100mg  for him to take twice a day for seven days. Adverse effected noted with Augmentin. This is a one time deal...in the future he will need to schedule an appointment for any antibiotics. It was documented during his last recent hospitalization that he may have sinusitis and they were considering starting antibiotics, so I feel comfortable giving it to him at this time. If he does not start improving in 1 week with the antibiotics, he will still need to schedule an appointment to be seen. Please continue to take the Flonase.   Dr. Gerlean Ren

## 2015-02-09 NOTE — Telephone Encounter (Signed)
Patient is aware of this. Matthew [name],CMA  

## 2015-02-15 ENCOUNTER — Inpatient Hospital Stay (HOSPITAL_COMMUNITY): Payer: Medicaid Other

## 2015-02-15 ENCOUNTER — Encounter (HOSPITAL_COMMUNITY): Payer: Self-pay | Admitting: Emergency Medicine

## 2015-02-15 ENCOUNTER — Inpatient Hospital Stay (HOSPITAL_COMMUNITY)
Admission: EM | Admit: 2015-02-15 | Discharge: 2015-02-19 | DRG: 187 | Disposition: A | Discharge status: Home / Self Care | Payer: Medicaid Other | Attending: Family Medicine | Admitting: Family Medicine

## 2015-02-15 ENCOUNTER — Emergency Department (HOSPITAL_COMMUNITY): Payer: Medicaid Other

## 2015-02-15 DIAGNOSIS — Z6841 Body Mass Index (BMI) 40.0 and over, adult: Secondary | ICD-10-CM

## 2015-02-15 DIAGNOSIS — K219 Gastro-esophageal reflux disease without esophagitis: Secondary | ICD-10-CM | POA: Diagnosis present

## 2015-02-15 DIAGNOSIS — E119 Type 2 diabetes mellitus without complications: Secondary | ICD-10-CM | POA: Diagnosis present

## 2015-02-15 DIAGNOSIS — K921 Melena: Secondary | ICD-10-CM | POA: Diagnosis present

## 2015-02-15 DIAGNOSIS — Z9889 Other specified postprocedural states: Secondary | ICD-10-CM

## 2015-02-15 DIAGNOSIS — Z7982 Long term (current) use of aspirin: Secondary | ICD-10-CM

## 2015-02-15 DIAGNOSIS — R0902 Hypoxemia: Secondary | ICD-10-CM

## 2015-02-15 DIAGNOSIS — I482 Chronic atrial fibrillation: Secondary | ICD-10-CM

## 2015-02-15 DIAGNOSIS — K7030 Alcoholic cirrhosis of liver without ascites: Secondary | ICD-10-CM | POA: Diagnosis present

## 2015-02-15 DIAGNOSIS — Z87891 Personal history of nicotine dependence: Secondary | ICD-10-CM | POA: Diagnosis not present

## 2015-02-15 DIAGNOSIS — R809 Proteinuria, unspecified: Secondary | ICD-10-CM | POA: Diagnosis not present

## 2015-02-15 DIAGNOSIS — E669 Obesity, unspecified: Secondary | ICD-10-CM | POA: Diagnosis present

## 2015-02-15 DIAGNOSIS — Z7951 Long term (current) use of inhaled steroids: Secondary | ICD-10-CM | POA: Diagnosis not present

## 2015-02-15 DIAGNOSIS — I10 Essential (primary) hypertension: Secondary | ICD-10-CM | POA: Diagnosis present

## 2015-02-15 DIAGNOSIS — I509 Heart failure, unspecified: Secondary | ICD-10-CM | POA: Diagnosis present

## 2015-02-15 DIAGNOSIS — K760 Fatty (change of) liver, not elsewhere classified: Secondary | ICD-10-CM | POA: Diagnosis present

## 2015-02-15 DIAGNOSIS — I4891 Unspecified atrial fibrillation: Secondary | ICD-10-CM | POA: Diagnosis present

## 2015-02-15 DIAGNOSIS — G4730 Sleep apnea, unspecified: Secondary | ICD-10-CM | POA: Diagnosis present

## 2015-02-15 DIAGNOSIS — R0602 Shortness of breath: Secondary | ICD-10-CM | POA: Diagnosis present

## 2015-02-15 DIAGNOSIS — F1010 Alcohol abuse, uncomplicated: Secondary | ICD-10-CM

## 2015-02-15 DIAGNOSIS — Z881 Allergy status to other antibiotic agents status: Secondary | ICD-10-CM

## 2015-02-15 DIAGNOSIS — J90 Pleural effusion, not elsewhere classified: Secondary | ICD-10-CM | POA: Diagnosis present

## 2015-02-15 DIAGNOSIS — F1011 Alcohol abuse, in remission: Secondary | ICD-10-CM | POA: Diagnosis present

## 2015-02-15 LAB — CBC WITH DIFFERENTIAL/PLATELET
BASOS ABS: 0.2 10*3/uL — AB (ref 0.0–0.1)
BASOS PCT: 2 % — AB (ref 0–1)
EOS PCT: 2 % (ref 0–5)
Eosinophils Absolute: 0.2 10*3/uL (ref 0.0–0.7)
HCT: 42 % (ref 39.0–52.0)
Hemoglobin: 14.3 g/dL (ref 13.0–17.0)
Lymphocytes Relative: 29 % (ref 12–46)
Lymphs Abs: 3.1 10*3/uL (ref 0.7–4.0)
MCH: 33.1 pg (ref 26.0–34.0)
MCHC: 34 g/dL (ref 30.0–36.0)
MCV: 97.2 fL (ref 78.0–100.0)
MONOS PCT: 11 % (ref 3–12)
Monocytes Absolute: 1.2 10*3/uL — ABNORMAL HIGH (ref 0.1–1.0)
Neutro Abs: 6.2 10*3/uL (ref 1.7–7.7)
Neutrophils Relative %: 56 % (ref 43–77)
Platelets: 374 10*3/uL (ref 150–400)
RBC: 4.32 MIL/uL (ref 4.22–5.81)
RDW: 16.8 % — AB (ref 11.5–15.5)
WBC: 9.9 10*3/uL (ref 4.0–10.5)

## 2015-02-15 LAB — COMPREHENSIVE METABOLIC PANEL
ALK PHOS: 129 U/L — AB (ref 38–126)
ALT: 32 U/L (ref 17–63)
ANION GAP: 10 (ref 5–15)
AST: 55 U/L — ABNORMAL HIGH (ref 15–41)
Albumin: 2.4 g/dL — ABNORMAL LOW (ref 3.5–5.0)
BILIRUBIN TOTAL: 0.8 mg/dL (ref 0.3–1.2)
BUN: 6 mg/dL (ref 6–20)
CO2: 28 mmol/L (ref 22–32)
Calcium: 8.2 mg/dL — ABNORMAL LOW (ref 8.9–10.3)
Chloride: 101 mmol/L (ref 101–111)
Creatinine, Ser: 0.76 mg/dL (ref 0.61–1.24)
GFR calc Af Amer: >60 mL/min (ref >60)
GFR calc non Af Amer: >60 mL/min (ref >60)
GLUCOSE: 112 mg/dL — AB (ref 65–99)
POTASSIUM: 3.9 mmol/L (ref 3.5–5.1)
Sodium: 139 mmol/L (ref 135–145)
TOTAL PROTEIN: 7.5 g/dL (ref 6.5–8.1)

## 2015-02-15 LAB — PROTEIN / CREATININE RATIO, URINE
Creatinine, Urine: 67.86 mg/dL
PROTEIN CREATININE RATIO: 0.49 mg/mg{creat} — AB (ref 0.00–0.15)
Total Protein, Urine: 33 mg/dL

## 2015-02-15 LAB — POC OCCULT BLOOD, ED: Fecal Occult Bld: POSITIVE — AB

## 2015-02-15 LAB — PROTIME-INR
INR: 1.42 (ref 0.00–1.49)
PROTHROMBIN TIME: 17.5 s — AB (ref 11.6–15.2)

## 2015-02-15 LAB — TYPE AND SCREEN
ABO/RH(D): A POS
Antibody Screen: NEGATIVE

## 2015-02-15 LAB — LIPASE, BLOOD: Lipase: 40 U/L (ref 22–51)

## 2015-02-15 LAB — ABO/RH: ABO/RH(D): A POS

## 2015-02-15 MED ORDER — PNEUMOCOCCAL VAC POLYVALENT 25 MCG/0.5ML IJ INJ
0.5000 mL | INJECTION | INTRAMUSCULAR | Status: DC
  Filled 2015-02-15 (×2): qty 0.5

## 2015-02-15 MED ORDER — LIDOCAINE HCL (PF) 1 % IJ SOLN
INTRAMUSCULAR | Status: AC
  Filled 2015-02-15: qty 10

## 2015-02-15 MED ORDER — DILTIAZEM HCL ER COATED BEADS 240 MG PO CP24
240.0000 mg | ORAL_CAPSULE | Freq: Every day | ORAL | Status: DC
  Administered 2015-02-15 – 2015-02-16 (×2): 240 mg via ORAL
  Filled 2015-02-15 (×3): qty 1

## 2015-02-15 MED ORDER — THIAMINE HCL 100 MG/ML IJ SOLN
100.0000 mg | Freq: Every day | INTRAMUSCULAR | Status: DC
  Filled 2015-02-15 (×3): qty 1

## 2015-02-15 MED ORDER — HEPARIN SODIUM (PORCINE) 5000 UNIT/ML IJ SOLN
5000.0000 [IU] | Freq: Three times a day (TID) | INTRAMUSCULAR | Status: DC
  Administered 2015-02-15 – 2015-02-19 (×14): 5000 [IU] via SUBCUTANEOUS
  Filled 2015-02-15 (×13): qty 1

## 2015-02-15 MED ORDER — SPIRONOLACTONE 50 MG PO TABS
50.0000 mg | ORAL_TABLET | Freq: Every day | ORAL | Status: DC
  Filled 2015-02-15: qty 1

## 2015-02-15 MED ORDER — LORAZEPAM 1 MG PO TABS: 1.0000 mg | ORAL_TABLET | Freq: Four times a day (QID) | ORAL | Status: DC | PRN

## 2015-02-15 MED ORDER — FOLIC ACID 1 MG PO TABS
1.0000 mg | ORAL_TABLET | Freq: Every day | ORAL | Status: DC
  Administered 2015-02-15 – 2015-02-19 (×5): 1 mg via ORAL
  Filled 2015-02-15 (×5): qty 1

## 2015-02-15 MED ORDER — SPIRONOLACTONE 100 MG PO TABS
100.0000 mg | ORAL_TABLET | Freq: Every day | ORAL | Status: DC
  Administered 2015-02-15 – 2015-02-19 (×5): 100 mg via ORAL
  Filled 2015-02-15 (×5): qty 1

## 2015-02-15 MED ORDER — FUROSEMIDE 40 MG PO TABS
40.0000 mg | ORAL_TABLET | Freq: Every day | ORAL | Status: DC
  Administered 2015-02-16 – 2015-02-19 (×4): 40 mg via ORAL
  Filled 2015-02-15 (×6): qty 1

## 2015-02-15 MED ORDER — LORAZEPAM 2 MG/ML IJ SOLN: 1.0000 mg | Freq: Four times a day (QID) | INTRAMUSCULAR | Status: DC | PRN

## 2015-02-15 MED ORDER — FUROSEMIDE 10 MG/ML IJ SOLN
40.0000 mg | Freq: Once | INTRAMUSCULAR | Status: AC
  Administered 2015-02-15: 40 mg via INTRAVENOUS
  Filled 2015-02-15: qty 4

## 2015-02-15 MED ORDER — VITAMIN B-1 100 MG PO TABS
100.0000 mg | ORAL_TABLET | Freq: Every day | ORAL | Status: DC
  Administered 2015-02-15 – 2015-02-19 (×5): 100 mg via ORAL
  Filled 2015-02-15 (×5): qty 1

## 2015-02-15 MED ORDER — ADULT MULTIVITAMIN W/MINERALS CH
1.0000 | ORAL_TABLET | Freq: Every day | ORAL | Status: DC
  Administered 2015-02-15 – 2015-02-19 (×5): 1 via ORAL
  Filled 2015-02-15 (×5): qty 1

## 2015-02-15 NOTE — Progress Notes (Signed)
Family Medicine Teaching Service Daily Progress Note Intern Pager: [phone]  Patient name: Matthew [name] Medical record number: [id number] Date of birth: July 18, 1956 Age: 59 y.o. Gender: male  Primary Care Provider: Lorna Few, DO Consultants: IR Code Status: Full  Pt Overview and Major Events to Date:  5/19 - Admitted with SOB, found to have recurrent right plueral effusion  Assessment and Plan: Matthew [name] is a 59 y.o. male presenting with SOB x 7-10 days. PMH is significant for afib, HTN, chronic right pleural effusion, allergies  # SOB/Pleural Effusion: Stable on RA. Pleural effusion recurrent (present for the past year s/p ICU admission - appears to be further increasing in size since admission 01/29/15). Work up last admission revealed transudative process likely 2/2 cirrhosis. Echo 01/2015: EF 59-16%, nml systolic fxn. Likely recurrent due to inadequate diuresis - IR consulted for repeat thoracentesis to improve subjective SOB - Will increase diuretic therapy to spironolactone 100mg , lasix 40mg  - Strict I/Os - Would trial more aggressive diuretic therapy and alcohol cessation before considering aggressive intervention  # Afib: rate controlled. CHADSVASC 1  - Continue home diltiazem 120mg  daily - Aspirin daily  # HTN: BP normal in ED - diltiazem as above  # Cirrhosis. Based on abdominal CT in 2015. AST 55, ALT 32, consistent with alcohol abuse. Also likely has element of fatty liver disease.  - Per prior DC summary, attempted to have abdominal MRI while inpatient, however MRI could not accommodate patient's weight. - Will need outpatient evaluation.   # Hematochezia: It appears that patient had referral to GI for this in past years. Hgb stable 14.3. Unsure of last colonoscopy. -FOBT + -Likely outpatient GI evaluation  # Psych. Patient with suicide attempt last year. Also with history of alcohol abuse - Will revisit starting SSRI or psychotherapy - CIWA  protocol - Thiamine, MVI, Folic acid daily  FEN/GI: Heart healthy, carb modified diet, SLIV Prophylaxis: Sub-q heparin  Disposition: Admitted pending above management  Subjective:  Breathing well on RA this morning. SOB perhaps minimally better.   Objective: Temp:  [98.2 F (36.8 C)-98.7 F (37.1 C)] 98.7 F (37.1 C) (05/19 0706) Pulse Rate:  [70-86] 86 (05/19 0706) Resp:  [18-25] 22 (05/19 0706) BP: (102-154)/(52-84) 119/76 mmHg (05/19 0706) SpO2:  [89 %-94 %] 93 % (05/19 0706) Weight:  [369 lb (167.377 kg)-379 lb 6.6 oz (172.1 kg)] 379 lb 6.6 oz (172.1 kg) (05/19 0706) Physical Exam: General: Awake, sitting in bedside chair in NAD Cardiovascular: irregularly irregular, no murmurs appreciated Respiratory: NWOB, Diminished breath sounds in RLL fields with mild crackles Abdomen: Limited exam secondary to habitus, +BS, S, NT, ND Extremities: WWP, 2+ edema to knees bilaterally Neuro: Alert and conversational, no focal deficits.   Laboratory/Imaging/Diagnostic Testing:  Recent Labs Lab 02/15/15 0335  WBC 9.9  HGB 14.3  HCT 42.0  PLT 374    Recent Labs Lab 02/15/15 0335  NA 139  K 3.9  CL 101  CO2 28  BUN 6  CREATININE 0.76  CALCIUM 8.2*  PROT 7.5  BILITOT 0.8  ALKPHOS 129*  ALT 32  AST 55*  GLUCOSE 112*     Jaylea Plourde Burnetta Sabin, MD 02/15/2015, 1:58 PM PGY-1, [place] Intern pager: [phone], text pages welcome

## 2015-02-15 NOTE — ED Provider Notes (Signed)
CSN: [id number]     Arrival date & time 02/15/15  0212 History   This chart was scribed for Everlene Balls, MD by Julien Nordmann, ED Scribe. This patient was seen in room A12C/A12C and the patient's care was started at 2:58 AM.    Chief Complaint  Patient presents with  . Shortness of Breath    The history is provided by the patient. No language interpreter was used.    HPI Comments: Matthew [name] is a 59 y.o. male who presents to the Emergency Department complaining of gradually worsening, bloody stool onset on year ago. Pt notes he also has shortness of breath onset 2 weeks ago that he went to the hospital for. Pt has been taking 81 mg of aspirin. Pt has not seen a GU doctor. He denies light headedness, passing out, weakness and tiredness, chest pain, vomiting, normal urinary problem. Pt has past medical history of septic shock in 2015.  Past Medical History  Diagnosis Date  . Atrial fibrillation, chronic   . Hypertension   . GERD (gastroesophageal reflux disease)   . Septic shock   . CHF (congestive heart failure)   . Shortness of breath dyspnea    Past Surgical History  Procedure Laterality Date  . Splenectomy    . Tonsilectomy, adenoidectomy, bilateral myringotomy and tubes    . Knee arthroscopy    . Hernia repair     History reviewed. No pertinent family history. History  Substance Use Topics  . Smoking status: Former Smoker    Types: Cigarettes    Quit date: 11/01/1974  . Smokeless tobacco: Never Used     Comment: smoked in high school for about a year  . Alcohol Use: Yes    Review of Systems  A complete 10 system review of systems was obtained and all systems are negative except as noted in the HPI and PMH.    Allergies  Potassium-containing compounds; Augmentin; and Neomycin  Home Medications   Prior to Admission medications   Medication Sig Start Date End Date Taking? Authorizing Provider  aspirin EC 81 MG EC tablet Take 1 tablet (81 mg total) by mouth  daily. 02/01/15   Vivi Barrack, MD  cetirizine (ZYRTEC) 10 MG tablet Take 1 tablet (10 mg total) by mouth daily. 02/08/15   Letona N Rumley, DO  diltiazem (CARDIZEM LA) 240 MG 24 hr tablet Take 1 tablet (240 mg total) by mouth daily. 02/02/15   Olam Idler, MD  doxycycline (VIBRA-TABS) 100 MG tablet Take 1 tablet (100 mg total) by mouth 2 (two) times daily. 02/09/15   Montara N Rumley, DO  fluticasone (FLONASE) 50 MCG/ACT nasal spray Place 2 sprays into both nostrils daily. 02/08/15   [employer] N Rumley, DO  folic acid (FOLVITE) 1 MG tablet Take 1 tablet (1 mg total) by mouth daily. 02/05/15   Coral Spikes, DO  Multiple Vitamin (MULTIVITAMIN WITH MINERALS) TABS tablet Take 1 tablet by mouth daily. Patient not taking: Reported on 09/25/2014 07/20/14   Elberta Leatherwood, MD  nitrofurantoin, macrocrystal-monohydrate, (MACROBID) 100 MG capsule Take 1 capsule (100 mg total) by mouth 2 (two) times daily. 02/01/15   Vivi Barrack, MD  spironolactone (ALDACTONE) 50 MG tablet Take 1 tablet (50 mg total) by mouth daily. 02/01/15   Vivi Barrack, MD  thiamine 100 MG tablet Take 1 tablet (100 mg total) by mouth daily. Patient not taking: Reported on 09/25/2014 07/20/14   Elberta Leatherwood, MD   Triage vitals:  BP 154/74 mmHg  Pulse 78  Temp(Src) 98.2 F (36.8 C) (Oral)  Resp 23  Ht 5' 10.5" (1.791 m)  Wt 377 lb (171.006 kg)  BMI 53.31 kg/m2  SpO2 94% Physical Exam  Constitutional: He is oriented to person, place, and time. Vital signs are normal. He appears well-developed and well-nourished.  Non-toxic appearance. He does not appear ill. No distress.  Obese male   HENT:  Head: Normocephalic and atraumatic.  Nose: Nose normal.  Mouth/Throat: Oropharynx is clear and moist. No oropharyngeal exudate.  Eyes: Conjunctivae and EOM are normal. Pupils are equal, round, and reactive to light. No scleral icterus.  Neck: Normal range of motion. Neck supple. No tracheal deviation, no edema, no erythema and normal range of  motion present. No thyroid mass and no thyromegaly present.  Cardiovascular: Normal rate, regular rhythm, S1 normal, S2 normal, normal heart sounds, intact distal pulses and normal pulses.  Exam reveals no gallop and no friction rub.   No murmur heard. Pulses:      Radial pulses are 2+ on the right side, and 2+ on the left side.       Dorsalis pedis pulses are 2+ on the right side, and 2+ on the left side.  Pulmonary/Chest: Effort normal and breath sounds normal. No respiratory distress. He has no wheezes. He has no rhonchi. He has no rales.  Abdominal: Soft. Normal appearance and bowel sounds are normal. He exhibits no distension, no ascites and no mass. There is no hepatosplenomegaly. There is no tenderness. There is no rebound, no guarding and no CVA tenderness.  Genitourinary:  Periumbilical hernia easily reducible  Musculoskeletal: Normal range of motion. He exhibits no edema or tenderness.   2 plus bilateral LE edema Chronic venus status changes  Lymphadenopathy:    He has no cervical adenopathy.  Neurological: He is alert and oriented to person, place, and time. He has normal strength. No cranial nerve deficit or sensory deficit.  Skin: Skin is warm, dry and intact. No petechiae and no rash noted. He is not diaphoretic. No erythema. No pallor.  Psychiatric: He has a normal mood and affect. His behavior is normal. Judgment normal.  Nursing note and vitals reviewed.   ED Course  Procedures   DIAGNOSTIC STUDIES: Oxygen Saturation is 94% on  (4L/min), low by my interpretation.  COORDINATION OF CARE:  3:03 AM Discussed treatment plan which includes blood work with pt at bedside and pt agreed to plan.   Labs Review Labs Reviewed  CBC WITH DIFFERENTIAL/PLATELET - Abnormal; Notable for the following:    RDW 16.8 (*)    Monocytes Absolute 1.2 (*)    Basophils Relative 2 (*)    Basophils Absolute 0.2 (*)    All other components within normal limits  COMPREHENSIVE METABOLIC PANEL  - Abnormal; Notable for the following:    Glucose, Bld 112 (*)    Calcium 8.2 (*)    Albumin 2.4 (*)    AST 55 (*)    Alkaline Phosphatase 129 (*)    All other components within normal limits  PROTIME-INR - Abnormal; Notable for the following:    Prothrombin Time 17.5 (*)    All other components within normal limits  POC OCCULT BLOOD, ED - Abnormal; Notable for the following:    Fecal Occult Bld POSITIVE (*)    All other components within normal limits  LIPASE, BLOOD  TYPE AND SCREEN  ABO/RH    Imaging Review Dg Chest 2 View  02/15/2015  CLINICAL DATA:  Shortness of breath, history of CHF and diabetes, atrial fibrillation.  EXAM: CHEST  2 VIEW  COMPARISON:  Chest radiograph Jan 30, 2015  FINDINGS: Increasing moderate layering RIGHT pleural effusion. The cardiac silhouette appears moderately enlarged, unchanged. Mediastinal silhouette is nonsuspicious. Central pulmonary vascular congestion and moderate similar interstitial prominence. No LEFT pleural effusion or focal consolidation. No pneumothorax or mediastinal shift. Soft tissue planes and included osseous structures are nonsuspicious  IMPRESSION: Moderate, increasing layering RIGHT pleural effusion. Recommend follow-up radiograph to verify improvement and, exclude underlying mass/pneumonia.  Cardiomegaly. Pulmonary vascular congestion and interstitial prominence suggest pulmonary edema.   Electronically Signed   By: Elon Alas   On: 02/15/2015 04:20     EKG Interpretation   Date/Time:  Thursday Feb 15 2015 02:23:24 EDT Ventricular Rate:  80 PR Interval:    QRS Duration: 92 QT Interval:  357 QTC Calculation: 412 R Axis:   -65 Text Interpretation:  Atrial fibrillation Inferior infarct, old Anterior  infarct, old No significant change since last tracing Confirmed by Glynn Octave [phone]) on 02/15/2015 2:37:25 AM      MDM   Final diagnoses:  None    Patient presents emergency department for multiple  complaints. He states he called 911 because he had bloody stools. He states he's had bloody stools for years now. He takes a baby aspirin every day. He has not followed up with gastroenterology due to insurance problems. He also states he has been short of breath during the interval. He is denying any chest pain anywhere. Will obtain laboratory studies and Hemoccult testing. EKG reveals atrial fibrillation which he has a history of.  Hemoccult is positive, hemoglobin is 14. I doubt this is the cause of shortness of breath. Chest x-ray does reveal increasing right-sided pleural effusion. In addition patient's oxygen saturation was 88% on room air. 4 L nasal cannula was applied to the patient. He states he does not wear oxygen normally at baseline. Patient will be admitted as a bounce back to family practice for further management.  I personally performed the services described in this documentation, which was scribed in my presence. The recorded information has been reviewed and is accurate.   Everlene Balls, MD 02/15/15 [phone]

## 2015-02-15 NOTE — H&P (Signed)
[facility] Admission History and Physical Service Pager: [phone]  Patient name: Matthew [name] Medical record number: [id number] Date of birth: 1956-08-09 Age: 59 y.o. Gender: male  Primary Care Provider: Lorna Few, DO Consultants: IR Code Status: FULL  Chief Complaint: shortness of breath  Assessment and Plan: Matthew [name] is a 59 y.o. male presenting with SOB x 7-10 days. PMH is significant for afib, HTN, chronic right pleural effusion, allergies  # SOB/Pleural Effusion: initially requiring 4L oxygen for desats 88%. CXR significant for right pleural effusion (present for the past year s/p ICU admission - appears to be further increasing in size since admission 01/29/15) & pulmonary vascular congestion. Patient with similar admission earlier this month when he had a thoracentesis performed.  Transudative process likely 2/2 cirrhosis.  Echo 01/2015: EF 62-13%, nml systolic fxn - Admit to telemetry under Dr Mingo Amber - Vitals per floor protocol - O2 PRN to keep sat >90%  - spironolactone 50mg  daily - Will give Lasix IV 40mg  x1 - Would consider Arlyce Harman 100mg  and Lasix 40mg  regimen to help prevent reaccummulation of fluid in the setting of cirrhosis. - Intake and output - c/s IR in am for repeat thoracentesis  # Afib: rate controlled.  EKG unchanged from previous.  was discharged on daily ASA.  Was on warfarin in the past as well but did not like blood monitoring. CHADSVASC 1  - Continue home diltiazem 120mg  daily - Aspirin daily  # HTN: BP normal in ED - diltiazem as above  # Cirrhosis. Based on abdominal CT in 2015. AST 55, ALT 32, consistent with alcohol abuse. Also likely has element of fatty liver disease.  - Per prior DC summary, attempted to have abdominal MRI while inpatient, however MRI could not accommodate patient's weight. - Will need outpatient evaluation.   # Hematochezia: It appears that patient had referral to GI for this in  past years.  Hgb stable 14.3. Unsure of last colonoscopy. -FOBT + -Likely outpatient GI evaluation  # Psych. Patient with suicide attempt last year. Also with history of alcohol abuse - Will revisit starting SSRI or psychotherapy - CIWA protocol - Thiamine, MVI, Folic acid daily  FEN/GI: NPO, SLIV Prophylaxis: Sub-q heparin  Disposition: Admit to telemetry.  Discharge pending improvement in breathing  History of Present Illness: Matthew [name] is a 59 y.o. male presenting with SOB that initially required 4L O2.  He reports that he was able to walk up and down his driveway prior to last admission but has been unable to do so since.  He states that SOB got worse this evening and therefore he came to ED.  He reports poor appetite and bloody stools.  States that bloody stools have been ongoing >1year.  Patient also reports he continues to drink a pint per "drinking episode", which he reports is NOT daily but avoids quantifying exactly how often.  Endorses exertional SOB, chills, hematochezia. Denies fevers, N/V, pain with defecation, dysuria, diarrhea, constipation.    Review Of Systems: Per HPI with the following additions: n/a Otherwise 12 point review of systems was performed and was unremarkable.  Patient Active Problem List   Diagnosis Date Noted  . UTI (urinary tract infection) 02/05/2015  . Essential hypertension   . Dyspnea 01/29/2015  . Drug ingestion 07/18/2014  . Adjustment disorder with disturbance of emotion 07/14/2014  . Pannus, abdominal 02/23/2014  . 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 Poor social situation 11/11/2013  . Chronic alcohol abuse 11/03/2013  .  A-fib 03/25/2012  . DM type 2 (diabetes mellitus, type 2) 03/25/2012  . Obesity 03/25/2012  . Sleep apnea 03/25/2012  . Fatty liver 03/25/2012   Past Medical History: Past Medical History  Diagnosis Date  . Atrial fibrillation, chronic   . Hypertension   . GERD (gastroesophageal reflux disease)   .  Septic shock   . CHF (congestive heart failure)   . Shortness of breath dyspnea    Past Surgical History: Past Surgical History  Procedure Laterality Date  . Splenectomy    . Tonsilectomy, adenoidectomy, bilateral myringotomy and tubes    . Knee arthroscopy    . Hernia repair     Social History: History  Substance Use Topics  . Smoking status: Former Smoker    Types: Cigarettes    Quit date: 11/01/1974  . Smokeless tobacco: Never Used     Comment: smoked in high school for about a year  . Alcohol Use: Yes   Additional social history: drinks 1 pint when he drinks, which he reports as not daily.  Please also refer to relevant sections of EMR.  Family History: History reviewed. No pertinent family history. Allergies and Medications: Allergies  Allergen Reactions  . Potassium-Containing Compounds Other (See Comments)    Stomach Pain  . Augmentin [Amoxicillin-Pot Clavulanate] Nausea Only    Most of the time it is okay, but has had some bad experiences with it.  [name] Neomycin Rash   No current facility-administered medications on file prior to encounter.   Current Outpatient Prescriptions on File Prior to Encounter  Medication Sig Dispense Refill  . aspirin EC 81 MG EC tablet Take 1 tablet (81 mg total) by mouth daily. 30 tablet 3  . cetirizine (ZYRTEC) 10 MG tablet Take 1 tablet (10 mg total) by mouth daily. 30 tablet 3  . diltiazem (CARDIZEM LA) 240 MG 24 hr tablet Take 1 tablet (240 mg total) by mouth daily. 32 tablet 0  . doxycycline (VIBRA-TABS) 100 MG tablet Take 1 tablet (100 mg total) by mouth 2 (two) times daily. 14 tablet 0  . fluticasone (FLONASE) 50 MCG/ACT nasal spray Place 2 sprays into both nostrils daily. 16 g 6  . folic acid (FOLVITE) 1 MG tablet Take 1 tablet (1 mg total) by mouth daily.    . Multiple Vitamin (MULTIVITAMIN WITH MINERALS) TABS tablet Take 1 tablet by mouth daily. (Patient not taking: Reported on 09/25/2014) 30 tablet 0  . nitrofurantoin,  macrocrystal-monohydrate, (MACROBID) 100 MG capsule Take 1 capsule (100 mg total) by mouth 2 (two) times daily. 14 capsule 0  . spironolactone (ALDACTONE) 50 MG tablet Take 1 tablet (50 mg total) by mouth daily. 30 tablet 0  . thiamine 100 MG tablet Take 1 tablet (100 mg total) by mouth daily. (Patient not taking: Reported on 09/25/2014) 30 tablet 0    Objective: BP 122/61 mmHg  Pulse 75  Temp(Src) 98.2 F (36.8 C) (Oral)  Resp 21  Ht 5' 10.5" (1.791 m)  Wt 377 lb (171.006 kg)  BMI 53.31 kg/m2  SpO2 89% Exam: General: awake, alert, sitting up on edge of bed, obese, NAD Eyes: EOMI Cardiovascular: irregularly irregular, rate controlled Respiratory: mild crackles on RLL fields, breathing on RA 91-92%, no increased WOB Abdomen: obese, moderate umbilical hernia that does not appear strangulated, NT, +BS MSK: +2 pitting edema with below skin changes on b/l LE, moves extremities independently Skin: marked dry, erythematous changes consistent with venous stasis in b/l LE to shins Neuro: no focal deficits, follows commands  Psych: speech normal, mood stable  Labs and Imaging: CBC BMET   Recent Labs Lab 02/15/15 0335  WBC 9.9  HGB 14.3  HCT 42.0  PLT 374   No results for input(s): NA, K, CL, CO2, BUN, CREATININE, GLUCOSE, CALCIUM in the last 168 hours.   Results for orders placed or performed during the hospital encounter of 02/15/15 (from the past 24 hour(s))  CBC with Differential/Platelet     Status: Abnormal   Collection Time: 02/15/15  3:35 AM  Result Value Ref Range   WBC 9.9 4.0 - 10.5 K/uL   RBC 4.32 4.22 - 5.81 MIL/uL   Hemoglobin 14.3 13.0 - 17.0 g/dL   HCT 42.0 39.0 - 52.0 %   MCV 97.2 78.0 - 100.0 fL   MCH 33.1 26.0 - 34.0 pg   MCHC 34.0 30.0 - 36.0 g/dL   RDW 16.8 (H) 11.5 - 15.5 %   Platelets 374 150 - 400 K/uL   Neutrophils Relative % 56 43 - 77 %   Neutro Abs 6.2 1.7 - 7.7 K/uL   Lymphocytes Relative 29 12 - 46 %   Lymphs Abs 3.1 0.7 - 4.0 K/uL   Monocytes  Relative 11 3 - 12 %   Monocytes Absolute 1.2 (H) 0.1 - 1.0 K/uL   Eosinophils Relative 2 0 - 5 %   Eosinophils Absolute 0.2 0.0 - 0.7 K/uL   Basophils Relative 2 (H) 0 - 1 %   Basophils Absolute 0.2 (H) 0.0 - 0.1 K/uL  Comprehensive metabolic panel     Status: Abnormal   Collection Time: 02/15/15  3:35 AM  Result Value Ref Range   Sodium 139 135 - 145 mmol/L   Potassium 3.9 3.5 - 5.1 mmol/L   Chloride 101 101 - 111 mmol/L   CO2 28 22 - 32 mmol/L   Glucose, Bld 112 (H) 65 - 99 mg/dL   BUN 6 6 - 20 mg/dL   Creatinine, Ser 0.76 0.61 - 1.24 mg/dL   Calcium 8.2 (L) 8.9 - 10.3 mg/dL   Total Protein 7.5 6.5 - 8.1 g/dL   Albumin 2.4 (L) 3.5 - 5.0 g/dL   AST 55 (H) 15 - 41 U/L   ALT 32 17 - 63 U/L   Alkaline Phosphatase 129 (H) 38 - 126 U/L   Total Bilirubin 0.8 0.3 - 1.2 mg/dL   GFR calc non Af Amer >60 >60 mL/min   GFR calc Af Amer >60 >60 mL/min   Anion gap 10 5 - 15  Lipase, blood     Status: None   Collection Time: 02/15/15  3:35 AM  Result Value Ref Range   Lipase 40 22 - 51 U/L  Protime-INR     Status: Abnormal   Collection Time: 02/15/15  3:35 AM  Result Value Ref Range   Prothrombin Time 17.5 (H) 11.6 - 15.2 seconds   INR 1.42 0.00 - 1.49  Type and screen     Status: None   Collection Time: 02/15/15  3:36 AM  Result Value Ref Range   ABO/RH(D) A POS    Antibody Screen NEG    Sample Expiration 02/18/2015   ABO/Rh     Status: None (Preliminary result)   Collection Time: 02/15/15  3:36 AM  Result Value Ref Range   ABO/RH(D) A POS   POC occult blood, ED     Status: Abnormal   Collection Time: 02/15/15  4:30 AM  Result Value Ref Range   Fecal Occult Bld POSITIVE (A)  NEGATIVE   Dg Chest 2 View  02/15/2015   CLINICAL DATA:  Shortness of breath, history of CHF and diabetes, atrial fibrillation.  EXAM: CHEST  2 VIEW  COMPARISON:  Chest radiograph Jan 30, 2015  FINDINGS: Increasing moderate layering RIGHT pleural effusion. The cardiac silhouette appears moderately enlarged,  unchanged. Mediastinal silhouette is nonsuspicious. Central pulmonary vascular congestion and moderate similar interstitial prominence. No LEFT pleural effusion or focal consolidation. No pneumothorax or mediastinal shift. Soft tissue planes and included osseous structures are nonsuspicious  IMPRESSION: Moderate, increasing layering RIGHT pleural effusion. Recommend follow-up radiograph to verify improvement and, exclude underlying mass/pneumonia.  Cardiomegaly. Pulmonary vascular congestion and interstitial prominence suggest pulmonary edema.   Electronically Signed   By: Elon Alas   On: 02/15/2015 04:20   Janora Norlander, DO 02/15/2015, 4:41 AM PGY-1, [facility] pager: [phone], text pages welcome  I have seen and evaluated the above patient.  I agree with the note.   [facility] PGY-3 Pager: [phone]

## 2015-02-15 NOTE — Discharge Summary (Signed)
Matthew [name] Discharge Summary  Patient name: Matthew [name] Medical record number: [id number] Date of birth: Jun 07, 1956 Age: 59 y.o. Gender: male Date of Admission: 02/15/2015  Date of Discharge: 02/19/2015 Admitting Physician: Alveda Reasons, MD  Primary Care Provider: Lorna Few, DO Consultants: VIR  Indication for Hospitalization: Shortness of breath  Discharge Diagnoses/Problem List:  Chronic right pleural effusion, proteinuria, atrial fibrillation, HTN, alcoholic cirrhosis, hematochezia,   Disposition: Home  Discharge Condition: Improved  Discharge Exam:  Blood pressure 123/65, pulse 85, temperature 98.5 F (36.9 C), temperature source Oral, resp. rate 17, height 5' 10.5" (1.791 m), weight 369 lb 1.6 oz (167.423 kg), SpO2 99 %. General: Awake, sitting in bedside chair in NAD Cardiovascular: irregularly irregular w/ normal rate. no murmurs appreciated Respiratory: NWOB, Diminished breath sounds bilaterally. No wheezes or crackles.  Abdomen: Limited exam secondary to habitus, +BS, S, NT, ND. Large panus with chronic inflammation w/o signs of current infection  Extremities: WWP, 2+ edema to knees bilaterally Neuro: Alert and conversational, no focal deficits.   Brief [name] Course:  Matthew [name] is a 59 year old man with PMH of chronic right pleural effusion, afib, HTN and cirrhosis who presented with dyspnea for 7 days. His [name] course by problem is outlined below:  Dyspnea / Chronic Pleural Effusion. Patient was found to have recurrent right sided pleural effusion on initial CXR in ED. Of note, patient was discharged 2 weeks prior to this admission for the same complaint. Patient underwent thoracentesis then and work up revealed a transudative effusion, likely secondary to his cirrhosis. He was discharged at that time on spironolactone 50mg . During this admission, we increased the patient's diuretic regimen to spironolactone  100mg , and lasix 40mg  daily. We additionally repeated a thoracentesis and approximately 1.5L of fluid was drained. Patient tolerated this procedure well. Patient was back to his baseline respiratory status and was discharged in stable condition.  Proteinuria Patient was noted to be positive for protein on his UA. We performed a urine protein:creatinine ratio, which was mildly elevated to 0.49. We then performed a 24 hour urine collection which revealed excretion of 440mg .  HTN/Afib We decreased the patient's dose of diltiazem to 180mg  daily.  Hematochezia Patient was noted to have GI referrals for this in the past. FOBT here was positive. Hemoglobin was stable during this stay.  Issues for Follow Up:  1. Follow up volume status and respiratory status - Discharged on spironolactone 100mg  and lasix 40mg  daily 2. Will need alcohol cessation counseling 3. Consider starting ACE inhibitor for patient's proteinuria.  4. Consider work up for orthostatic proteinuria as outpatient.  5. Follow up HR and BD - decreased home dose of diltiazem to 180mg  daily. 6. Will need referral to GI for intermittent hematochezia.   Significant Procedures: 5/19 Throacentesis  Significant Labs and Imaging:   Recent Labs Lab 02/15/15 0335 02/17/15 0957 02/18/15 0720  WBC 9.9 11.7* 11.6*  HGB 14.3 14.9 14.6  HCT 42.0 43.0 43.2  PLT 374 313 336    Recent Labs Lab 02/15/15 0335 02/17/15 0430 02/18/15 0720  NA 139 138 136  K 3.9 3.6 3.8  CL 101 100* 99*  CO2 28 32 33*  GLUCOSE 112* 91 139*  BUN 6 9 11   CREATININE 0.76 0.83 1.03  CALCIUM 8.2* 8.6* 8.5*  ALKPHOS 129*  --  121  AST 55*  --  39  ALT 32  --  24  ALBUMIN 2.4*  --  2.2*  Urine protein 33 Urine creatinine 67.86 UPC: 0.49  24H urine protein: 440mg   FOBT positive  Dg Chest 1 View  02/15/2015   CLINICAL DATA:  Post right thoracentesis.  EXAM: CHEST  1 VIEW  COMPARISON:  02/15/2015  FINDINGS: Improved aeration at the right lung base  compatible with recent thoracentesis. No significant pleural fluid is identified. No evidence for a large pneumothorax. Residual densities at the right lung base could represent atelectasis. Patient is rotated towards the right and limited evaluation of the heart.  IMPRESSION: Improved aeration at the right lung base compatible with recent thoracentesis. Negative for pneumothorax.   Electronically Signed   By: Markus Daft M.D.   On: 02/15/2015 16:04   Dg Chest 2 View  02/15/2015   CLINICAL DATA:  Shortness of breath, history of CHF and diabetes, atrial fibrillation.  EXAM: CHEST  2 VIEW  COMPARISON:  Chest radiograph Jan 30, 2015  FINDINGS: Increasing moderate layering RIGHT pleural effusion. The cardiac silhouette appears moderately enlarged, unchanged. Mediastinal silhouette is nonsuspicious. Central pulmonary vascular congestion and moderate similar interstitial prominence. No LEFT pleural effusion or focal consolidation. No pneumothorax or mediastinal shift. Soft tissue planes and included osseous structures are nonsuspicious  IMPRESSION: Moderate, increasing layering RIGHT pleural effusion. Recommend follow-up radiograph to verify improvement and, exclude underlying mass/pneumonia.  Cardiomegaly. Pulmonary vascular congestion and interstitial prominence suggest pulmonary edema.   Electronically Signed   By: Elon Alas   On: 02/15/2015 04:20   US Thoracentesis Asp Pleural Space W/img Guide  02/15/2015   CLINICAL DATA:  Pleural Effusion  EXAM: ULTRASOUND GUIDED Right THORACENTESIS  COMPARISON:  None.  PROCEDURE: An ultrasound guided thoracentesis was thoroughly discussed with the patient and questions answered. The benefits, risks, alternatives and complications were also discussed. The patient understands and wishes to proceed with the procedure. Written consent was obtained.  Ultrasound was performed to localize and mark an adequate pocket of fluid in the Right chest. The area was then prepped and  draped in the normal sterile fashion. 1% Lidocaine was used for local anesthesia. Under ultrasound guidance a 19 gauge Yueh catheter was introduced. Thoracentesis was performed. The catheter was removed and a dressing applied.  COMPLICATIONS: None.  FINDINGS: A total of approximately 1.5 Liters of Clear Yellow fluid was removed. A fluid sample was notsent for laboratory analysis.  IMPRESSION: Successful ultrasound guided Right thoracentesis yielding 1.5 Liters of pleural fluid.  Read By:  Gareth Eagle, PA-C   Electronically Signed   By: Lucrezia Europe M.D.   On: 02/15/2015 15:30   Results/Tests Pending at Time of Discharge: None  Discharge Medications:    Medication List    STOP taking these medications        diltiazem 240 MG 24 hr tablet  Commonly known as:  CARDIZEM LA  Replaced by:  diltiazem 180 MG 24 hr capsule     doxycycline 100 MG tablet  Commonly known as:  VIBRA-TABS     multivitamin with minerals Tabs tablet     nitrofurantoin (macrocrystal-monohydrate) 100 MG capsule  Commonly known as:  MACROBID      TAKE these medications        aspirin 81 MG EC tablet  Take 1 tablet (81 mg total) by mouth daily.     cetirizine 10 MG tablet  Commonly known as:  ZYRTEC  Take 1 tablet (10 mg total) by mouth daily.     diltiazem 180 MG 24 hr capsule  Commonly known as:  CARDIZEM CD  Take 1 capsule (180 mg total) by mouth daily.     fluticasone 50 MCG/ACT nasal spray  Commonly known as:  FLONASE  Place 2 sprays into both nostrils daily.     folic acid 1 MG tablet  Commonly known as:  FOLVITE  Take 1 tablet (1 mg total) by mouth daily.     furosemide 40 MG tablet  Commonly known as:  LASIX  Take 1 tablet (40 mg total) by mouth daily.     spironolactone 100 MG tablet  Commonly known as:  ALDACTONE  Take 1 tablet (100 mg total) by mouth daily.     thiamine 100 MG tablet  Take 1 tablet (100 mg total) by mouth daily.        Discharge Instructions: Please refer to Patient  Instructions section of EMR for full details.  Patient was counseled important signs and symptoms that should prompt return to medical care, changes in medications, dietary instructions, activity restrictions, and follow up appointments.   Follow-Up Appointments: Follow-up Information    Follow up with Thersa Salt, DO On 02/21/2015.   Specialty:  Family Medicine   Why:  1:45pm   Contact information:   [place] [address] [phone]       Vivi Barrack, MD 02/19/2015, 8:26 PM PGY-1, [place]

## 2015-02-15 NOTE — Procedures (Signed)
Successful US guided Right thoracentesis. Yielded 1.5 Liters of clear yellow fluid. Pt tolerated procedure well. No immediate complications.  Specimen was not sent for labs. CXR ordered.  Gareth Eagle R PA-C 02/15/2015 3:25 PM

## 2015-02-15 NOTE — ED Notes (Signed)
Pt brought to ED from home by [facility] for c/o SOB and bright red bloody stool, pt states that he was treated for bladder infection 2 wks ago and have some fluids removed las week from his right lung. Pt denies any dizziness or CP. O2 saturation remains between 90- 94% on 4L by [facility]. BP 120/60, HR, 77, r-25. Pt Afib on EMS's EKG.

## 2015-02-16 DIAGNOSIS — Z9889 Other specified postprocedural states: Secondary | ICD-10-CM | POA: Insufficient documentation

## 2015-02-16 MED ORDER — FUROSEMIDE 40 MG PO TABS: 40.0000 mg | ORAL_TABLET | Freq: Every day | ORAL | Status: DC

## 2015-02-16 MED ORDER — SPIRONOLACTONE 100 MG PO TABS
100.0000 mg | ORAL_TABLET | Freq: Every day | ORAL | Status: DC
Start: 2015-02-16 — End: 2015-02-18

## 2015-02-16 MED ORDER — BENZOCAINE 10 % MT GEL
Freq: Two times a day (BID) | OROMUCOSAL | Status: DC | PRN
  Filled 2015-02-16: qty 9.4

## 2015-02-16 NOTE — Progress Notes (Signed)
Family Medicine Teaching Service Daily Progress Note Intern Pager: [phone]  Patient name: Matthew [name] Medical record number: [id number] Date of birth: April 25, 1956 Age: 59 y.o. Gender: male  Primary Care Provider: Lorna Few, DO Consultants: IR Code Status: Full  Pt Overview and Major Events to Date:  5/19 - Admitted with SOB, found to have recurrent right plueral effusion  Assessment and Plan: Matthew [name] is a 59 y.o. male presenting with SOB x 7-10 days. PMH is significant for afib, HTN, chronic right pleural effusion, allergies  # SOB/Pleural Effusion: Stable on RA. Pleural effusion recurrent (present for the past year s/p ICU admission - appears to be further increasing in size since admission 01/29/15). Work up last admission revealed transudative process likely 2/2 cirrhosis. Echo 01/2015: EF 09-47%, nml systolic fxn. Likely recurrent due to inadequate diuresis and continued alcohol abuse.  - s/p thoracentesis 5/19 with 1.5L of fluid removed - Will increase diuretic therapy to spironolactone 100mg , lasix 40mg  - Strict I/Os - Would try more aggressive diuretic therapy and alcohol cessation before considering aggressive intervention  # Proteinuria. UPC 0.49 here - Will obtain 24 hour urine collection while here for definitive diagnosis of proteinuria.  - Further work up as outpatient if positive.   # Afib: rate controlled. CHADSVASC 1  - Continue home diltiazem 120mg  daily - Aspirin daily  # HTN: BP normal in ED - diltiazem as above  # Cirrhosis. Based on abdominal CT in 2015. AST 55, ALT 32, consistent with alcohol abuse. Also likely has element of fatty liver disease.  - Per prior DC summary, attempted to have abdominal MRI while inpatient, however MRI could not accommodate patient's weight. - Will need outpatient evaluation.   # Hematochezia: It appears that patient had referral to GI for this in past years. Hgb stable 14.3. Unsure of last  colonoscopy. -FOBT + -Likely outpatient GI evaluation  # Psych. Patient with suicide attempt last year. Also with history of alcohol abuse - Will revisit starting SSRI or psychotherapy - CIWA protocol - Thiamine, MVI, Folic acid daily  FEN/GI: Heart healthy, carb modified diet, SLIV Prophylaxis: Sub-q heparin  Disposition: Admitted pending above management, anticipate discharge tomorrow.   Subjective:  Tolerated procedure well yesterday. Feels like breathing is improved. No chest pain or shortness of breath. Unable to sleep due to sores in mouth.  Objective: Temp:  [97.3 F (36.3 C)-98.2 F (36.8 C)] 98 F (36.7 C) (05/20 0521) Pulse Rate:  [75-89] 81 (05/20 0521) Resp:  [16-18] 18 (05/20 0521) BP: (125-144)/(53-74) 139/74 mmHg (05/20 0521) SpO2:  [94 %-97 %] 95 % (05/20 0521) Weight:  [364 lb 6.4 oz (165.291 kg)] 364 lb 6.4 oz (165.291 kg) (05/19 2010) Physical Exam: General: Awake, sitting in bedside chair in NAD Cardiovascular: irregularly irregular, no murmurs appreciated Respiratory: NWOB, Diminished breath sounds bilaterally. No wheezes or crackles.  Abdomen: Limited exam secondary to habitus, +BS, S, NT, ND Extremities: WWP, 2+ edema to knees bilaterally Neuro: Alert and conversational, no focal deficits.   Laboratory/Imaging/Diagnostic Testing:  Recent Labs Lab 02/15/15 0335  WBC 9.9  HGB 14.3  HCT 42.0  PLT 374    Recent Labs Lab 02/15/15 0335  NA 139  K 3.9  CL 101  CO2 28  BUN 6  CREATININE 0.76  CALCIUM 8.2*  PROT 7.5  BILITOT 0.8  ALKPHOS 129*  ALT 32  AST 55*  GLUCOSE 112*   Urine protein 33 Urine creatinine 67.86 UPC: 0.49  Vivi Barrack, MD 02/16/2015,  8:16 AM PGY-1, [place] Intern pager: [phone], text pages welcome

## 2015-02-17 LAB — CBC
HEMATOCRIT: 43 % (ref 39.0–52.0)
Hemoglobin: 14.9 g/dL (ref 13.0–17.0)
MCH: 33.9 pg (ref 26.0–34.0)
MCHC: 34.7 g/dL (ref 30.0–36.0)
MCV: 97.7 fL (ref 78.0–100.0)
Platelets: 313 10*3/uL (ref 150–400)
RBC: 4.4 MIL/uL (ref 4.22–5.81)
RDW: 16.8 % — ABNORMAL HIGH (ref 11.5–15.5)
WBC: 11.7 10*3/uL — AB (ref 4.0–10.5)

## 2015-02-17 LAB — BASIC METABOLIC PANEL
Anion gap: 6 (ref 5–15)
BUN: 9 mg/dL (ref 6–20)
CO2: 32 mmol/L (ref 22–32)
Calcium: 8.6 mg/dL — ABNORMAL LOW (ref 8.9–10.3)
Chloride: 100 mmol/L — ABNORMAL LOW (ref 101–111)
Creatinine, Ser: 0.83 mg/dL (ref 0.61–1.24)
GFR calc Af Amer: >60 mL/min (ref >60)
GLUCOSE: 91 mg/dL (ref 65–99)
Potassium: 3.6 mmol/L (ref 3.5–5.1)
Sodium: 138 mmol/L (ref 135–145)

## 2015-02-17 LAB — PROTEIN, URINE, 24 HOUR
Collection Interval-UPROT: 24 hours
PROTEIN, 24H URINE: 440 mg/d — AB (ref 50–100)
PROTEIN, URINE: 11 mg/dL
Urine Total Volume-UPROT: 4000 mL

## 2015-02-17 MED ORDER — DILTIAZEM HCL ER COATED BEADS 180 MG PO CP24
180.0000 mg | ORAL_CAPSULE | Freq: Every day | ORAL | Status: DC
  Administered 2015-02-17 – 2015-02-19 (×3): 180 mg via ORAL
  Filled 2015-02-17 (×3): qty 1

## 2015-02-17 NOTE — Care Management Note (Signed)
Case Management Note  Patient Details  Name: [name] MRN: [id number] Date of Birth: [birth year]  Subjective/Objective:                  male presenting with SOB x 7-10 days.  Action/Plan: Discharge planning  Expected Discharge Date:  02/18/15               Expected Discharge Plan:  Homeless Shelter  In-House Referral:  Clinical Social Work  Discharge planning Services  CM Consult, Medication Assistance, [facility]  Post Acute Care Choice:    Choice offered to:     DME Arranged:    DME Agency:     HH Arranged:    [place] Agency:     Status of Service:  Completed, signed off  Medicare Important Message Given:    Date Medicare IM Given:    Medicare IM give by:    Date Additional Medicare IM Given:    Additional Medicare Important Message give by:     If discussed at [place] of Stay Meetings, dates discussed:    Additional Comments: CM was approached by MD requesting a consult to [facility] for homelessness; CM called Marshell Levan CSW [phone] who states he will follow.  CM met with pt and asked if he followed up at the [facility] after his last MATCH of 5/16.  Pt states no.  Pt states "Pamala Hurry" of Family medicine had given him "a bunch of paperwork" but he states it was too confusing to deal with.  CM gave pt who verbalized understanding (and I handwrote the three tasks on the pamphlet) He will go to the [facility] and ask for: 1. AN APPOINTMENT WITH A NAVIGATOR TO SECURE INSURANCE; 2 AN APPOINTMENT FOR A PRIMARY CARE PHYSICIAN; 3. AN APPOINTMENT FOR FOLLOW UP CARE.   Pt states he will be able to get his discharge prescriptions filled (MD prescribed as much as possible from $4 [employer] list).  No other CM needs were communicated. Dellie Catholic, RN 02/17/2015, 10:39 AM

## 2015-02-17 NOTE — Progress Notes (Signed)
Family Medicine Teaching Service Daily Progress Note Intern Pager: [phone]  Patient name: Matthew [name] Medical record number: [id number] Date of birth: 05-29-1956 Age: 59 y.o. Gender: male  Primary Care Provider: Lorna Few, DO Consultants: IR Code Status: Full  Pt Overview and Major Events to Date:  5/19 - Admitted with SOB, found to have recurrent right plueral effusion  Assessment and Plan: Matthew [name] is a 59 y.o. male presenting with SOB x 7-10 days. PMH is significant for afib, HTN, chronic right pleural effusion, allergies  # SOB/Pleural Effusion: Stable on RA. Pleural effusion recurrent (present for the past year s/p ICU admission - appears to be further increasing in size since admission 01/29/15). Work up last admission revealed transudative process likely 2/2 cirrhosis. Echo 01/2015: EF 91-63%, nml systolic fxn. Likely recurrent due to inadequate diuresis and continued alcohol abuse.  - s/p thoracentesis 5/19 with 1.5L of fluid removed - diuretic therapy to spironolactone 100mg , lasix 40mg  - Strict I/Os - Would try more aggressive diuretic therapy and alcohol cessation before considering aggressive intervention  # Proteinuria. UPC 0.49 here - 24 hour urine collection: Ends 5/21 @ 11am - Further work up as outpatient if positive.   # Afib: rate controlled. CHADSVASC 1  - Continue home diltiazem 120mg  daily - Aspirin daily  # HTN:  - diltiazem: Decrease to 180mg  on 5/21 given softer BPs with diuresis   # Cirrhosis. Based on abdominal CT in 2015. AST 55, ALT 32, consistent with alcohol abuse. Also likely has element of fatty liver disease.  - Per prior DC summary, attempted to have abdominal MRI while inpatient, however MRI could not accommodate patient's weight. - Will need outpatient evaluation.   # Hematochezia: It appears that patient had referral to GI for this in past years. Hgb stable 14.3. Unsure of last colonoscopy. -FOBT + -outpatient GI  evaluation  # Psych. Patient with suicide attempt last year. Also with history of alcohol abuse - Will have PCP assess and consider SSRI or psychotherapy - CIWA protocol - Thiamine, MVI, Folic acid daily  FEN/GI: Heart healthy, carb modified diet, SLIV Prophylaxis: Sub-q heparin  Disposition: Admitted pending above management, anticipate discharge tomorrow.   Subjective:  Denies trouble breathing today or difficulty ambulating.  However says he is unable to go home today due to being kicked out of his apt by his landlord due to inability to keep clean. Also reports recent loss of job and financial difficulties.   Objective: Temp:  [98.1 F (36.7 C)-98.8 F (37.1 C)] 98.8 F (37.1 C) (05/21 0815) Pulse Rate:  [77-85] 84 (05/21 0815) Resp:  [17-18] 17 (05/21 0815) BP: (106-133)/(47-74) 109/72 mmHg (05/21 0815) SpO2:  [95 %-98 %] 96 % (05/21 0815) Physical Exam: General: Awake, sitting in bedside chair in NAD Cardiovascular: irregularly irregular w/ normal rate.  no murmurs appreciated Respiratory: NWOB, Diminished breath sounds bilaterally. No wheezes or crackles.  Abdomen: Limited exam secondary to habitus, +BS, S, NT, ND. Large panus with chronic inflammation w/o signs of current infection  Extremities: WWP, 2+ edema to knees bilaterally Neuro: Alert and conversational, no focal deficits.   Laboratory/Imaging/Diagnostic Testing:  Recent Labs Lab 02/15/15 0335  WBC 9.9  HGB 14.3  HCT 42.0  PLT 374    Recent Labs Lab 02/15/15 0335 02/17/15 0430  NA 139 138  K 3.9 3.6  CL 101 100*  CO2 28 32  BUN 6 9  CREATININE 0.76 0.83  CALCIUM 8.2* 8.6*  PROT 7.5  --  BILITOT 0.8  --   ALKPHOS 129*  --   ALT 32  --   AST 55*  --   GLUCOSE 112* 91   Urine protein 33 Urine creatinine 67.86 UPC: 0.49  Olam Idler, MD 02/17/2015, 9:07 AM PGY-2, [place] Intern pager: [phone], text pages welcome

## 2015-02-18 LAB — CBC
HEMATOCRIT: 43.2 % (ref 39.0–52.0)
HEMOGLOBIN: 14.6 g/dL (ref 13.0–17.0)
MCH: 33.3 pg (ref 26.0–34.0)
MCHC: 33.8 g/dL (ref 30.0–36.0)
MCV: 98.6 fL (ref 78.0–100.0)
PLATELETS: 336 10*3/uL (ref 150–400)
RBC: 4.38 MIL/uL (ref 4.22–5.81)
RDW: 17 % — AB (ref 11.5–15.5)
WBC: 11.6 10*3/uL — ABNORMAL HIGH (ref 4.0–10.5)

## 2015-02-18 LAB — COMPREHENSIVE METABOLIC PANEL
ALT: 24 U/L (ref 17–63)
AST: 39 U/L (ref 15–41)
Albumin: 2.2 g/dL — ABNORMAL LOW (ref 3.5–5.0)
Alkaline Phosphatase: 121 U/L (ref 38–126)
Anion gap: 4 — ABNORMAL LOW (ref 5–15)
BUN: 11 mg/dL (ref 6–20)
CALCIUM: 8.5 mg/dL — AB (ref 8.9–10.3)
CO2: 33 mmol/L — ABNORMAL HIGH (ref 22–32)
CREATININE: 1.03 mg/dL (ref 0.61–1.24)
Chloride: 99 mmol/L — ABNORMAL LOW (ref 101–111)
GFR calc Af Amer: >60 mL/min (ref >60)
GFR calc non Af Amer: >60 mL/min (ref >60)
Glucose, Bld: 139 mg/dL — ABNORMAL HIGH (ref 65–99)
Potassium: 3.8 mmol/L (ref 3.5–5.1)
Sodium: 136 mmol/L (ref 135–145)
Total Bilirubin: 1.5 mg/dL — ABNORMAL HIGH (ref 0.3–1.2)
Total Protein: 7.3 g/dL (ref 6.5–8.1)

## 2015-02-18 MED ORDER — KETOCONAZOLE 2 % EX CREA
TOPICAL_CREAM | Freq: Every day | CUTANEOUS | Status: DC
  Administered 2015-02-18 – 2015-02-19 (×2): via TOPICAL
  Filled 2015-02-18 (×2): qty 15

## 2015-02-18 MED ORDER — FUROSEMIDE 40 MG PO TABS: 40.0000 mg | ORAL_TABLET | Freq: Every day | ORAL | Status: DC

## 2015-02-18 MED ORDER — SPIRONOLACTONE 100 MG PO TABS: 100.0000 mg | ORAL_TABLET | Freq: Every day | ORAL | Status: DC

## 2015-02-18 NOTE — Progress Notes (Signed)
FPTS Interim Progress Note  S: Went to assess patient this afternoon. Pt reports breathing is doing okay, reports some bleeding from "fold" in his stomach [referring to panus] and some changes in color of his legs  O: BP 136/62 mmHg  Pulse 80  Temp(Src) 97.9 F (36.6 C) (Oral)  Resp 18  Ht 5' 10.5" (1.791 m)  Wt 364 lb 6.4 oz (165.291 kg)  BMI 51.53 kg/m2  SpO2 96%   Abdomen: large abdominal panus, some blood noted lower side of pannus with peau d'orange appearance, some dried blood noted in areas of cracked skin on the left.  Skin: darkened LE bilaterally consistent with venous stasis changes. Some erythema on right anteromedial thigh where in contact with panus.  A/P: Chronic venous stasis changes: still with significant edema of both LE in addition to his abdominal panus. Again stressed to him he needs to quit drinking, go to rehab, and take his medicines as prescribed. Elevate legs above heart (pt currently sitting in chair at bedside). Candidal infection: likely chronic yeast infection underneath panus, rx for ketoconazole 2% cream daily.  Pt states he will be ready to go home tomorrow.   Leone Brand, MD 02/18/2015, 4:29 PM PGY-2, [place] Medicine Service pager [phone]

## 2015-02-18 NOTE — Progress Notes (Signed)
Family Medicine Teaching Service Daily Progress Note Intern Pager: [phone]  Patient name: Matthew [name] Medical record number: [id number] Date of birth: Aug 11, 1956 Age: 59 y.o. Gender: male  Primary Care Provider: Lorna Few, DO Consultants: IR Code Status: Full  Pt Overview and Major Events to Date:  5/19 - Admitted with SOB, found to have recurrent right plueral effusion 5/ 21: Reports not having an apt to go back to. CM gave info for homeless shelter   Assessment and Plan: Matthew [name] is a 59 y.o. male presenting with SOB x 7-10 days. PMH is significant for afib, HTN, chronic right pleural effusion, allergies  # SOB/Pleural Effusion: Stable on RA. Pleural effusion recurrent (present for the past year s/p ICU admission - appears to be further increasing in size since admission 01/29/15). Work up last admission revealed transudative process likely 2/2 cirrhosis. Echo 01/2015: EF 09-23%, nml systolic fxn. Likely recurrent due to inadequate diuresis and continued alcohol abuse.  - s/p thoracentesis 5/19 with 1.5L of fluid removed - diuretic therapy to spironolactone 100mg , lasix 40mg  - Strict I/Os - Would try more aggressive diuretic therapy and alcohol cessation before considering aggressive intervention  # Proteinuria. UPC 0.49 here - 24 hour urine collection: 440mg /day - Further work up as outpatient   # Afib: rate controlled. CHADSVASC 1  - Continue home diltiazem  May switch to Beta-blocker for cost but patient hesitant  - Aspirin daily  # HTN:  - diltiazem: Decrease to 180mg  - See Afib above  # Cirrhosis. Based on abdominal CT in 2015. AST 55, ALT 32, consistent with alcohol abuse. Also likely has element of fatty liver disease.  - Per prior DC summary, attempted to have abdominal MRI while inpatient, however MRI could not accommodate patient's weight. - Will need outpatient evaluation.   # Hematochezia: It appears that patient had referral to GI for this  in past years. Hgb stable 14.3. Unsure of last colonoscopy. -FOBT + -outpatient GI evaluation  # Psych. Patient with suicide attempt last year. Also with history of alcohol abuse - Will have PCP assess and consider SSRI or psychotherapy - CIWA protocol - Thiamine, MVI, Folic acid daily  FEN/GI: Heart healthy, carb modified diet, SLIV Prophylaxis: Sub-q heparin  Disposition: Admitted pending above management, anticipate discharge tomorrow.   Subjective:  Denies trouble breathing today or difficulty ambulating.  However says he has no where to live and is not interested in going to [facility] shelter.   Objective: Temp:  [97.9 F (36.6 C)-98.5 F (36.9 C)] 97.9 F (36.6 C) (05/22 0953) Pulse Rate:  [75-81] 80 (05/22 0953) Resp:  [17-18] 18 (05/22 0953) BP: (121-136)/(55-73) 136/62 mmHg (05/22 0953) SpO2:  [96 %-97 %] 96 % (05/22 0953) Physical Exam: General: Awake, sitting in bedside chair in NAD Cardiovascular: irregularly irregular w/ normal rate.  no murmurs appreciated Respiratory: NWOB, Diminished breath sounds bilaterally. No wheezes or crackles.  Abdomen: Limited exam secondary to habitus, +BS, S, NT, ND. Large panus with chronic inflammation w/o signs of current infection  Extremities: WWP, 2+ edema to knees bilaterally Neuro: Alert and conversational, no focal deficits.   Laboratory/Imaging/Diagnostic Testing:  Recent Labs Lab 02/15/15 0335 02/17/15 0957 02/18/15 0720  WBC 9.9 11.7* 11.6*  HGB 14.3 14.9 14.6  HCT 42.0 43.0 43.2  PLT 374 313 336    Recent Labs Lab 02/15/15 0335 02/17/15 0430 02/18/15 0720  NA 139 138 136  K 3.9 3.6 3.8  CL 101 100* 99*  CO2 28 32 33*  BUN 6 9 11   CREATININE 0.76 0.83 1.03  CALCIUM 8.2* 8.6* 8.5*  PROT 7.5  --  7.3  BILITOT 0.8  --  1.5*  ALKPHOS 129*  --  121  ALT 32  --  24  AST 55*  --  39  GLUCOSE 112* 91 139*   Urine protein 33 Urine creatinine 67.86 UPC: 0.49  Olam Idler, MD 02/18/2015, 10:18  AM PGY-2, [place] Intern pager: [phone], text pages welcome

## 2015-02-19 MED ORDER — DILTIAZEM HCL ER COATED BEADS 180 MG PO CP24: 180.0000 mg | ORAL_CAPSULE | Freq: Every day | ORAL | Status: DC

## 2015-02-19 NOTE — Discharge Instructions (Signed)
You were admitted to the hospital with a pleural effusion. This is probably due to not having enough diuretics and drinking to much alcohol. While here, we removed this fluid. We decreased your dose of diltiazem to 180mg  daily. We also increased your doses of diuretics. It is important that you follow up with your primary doctor.   Pleural Effusion The lining covering your lungs and the inside of your chest is called the pleura. Usually, the space between the two pleura contains no air and only a thin layer of fluid. A pleural effusion is an abnormal buildup of fluid in the pleural space. Fluid gathers when there is increased pressure in the lung vessels. This forces fluids out of the lungs and into the pleural space. Vessels may also leak fluids when there are infections, such as pneumonia, or other causes of soreness and redness (inflammation). Fluids leak into the lungs when protein in the blood is low or when certain vessels (lymphatics) are blocked. Finding a pleural effusion is important because it is usually caused by another disease. In order to treat a pleural effusion, your health care provider needs to find its cause. If left untreated, a large amount of fluid can build up and cause collapse of the lung. CAUSES   Heart failure.  Infections (pneumonia, tuberculosis), pulmonary embolism, pulmonary infarction.  Cancer (primary lung and metastatic), asbestosis.  Liver failure (cirrhosis).  Nephrotic syndrome, peritoneal dialysis, kidney problems (uremia).  Collagen vascular disease (systemic lupus erythematosus, rheumatoid arthritis).  Injury (trauma) to the chest or rupture of the digestive tube (esophagus).  Material in the chest or pleural space (hemothorax, chylothorax).  Pancreatitis.  Surgery.  Drug reactions. SYMPTOMS  A pleural effusion can decrease the amount of space available for breathing and make you short of breath. The fluid can become infected, which may cause  pain and fever. Often, the pain is worse when taking a deep breath. The underlying disease (heart failure, pneumonia, blood clot, tuberculosis, cancer) may also cause symptoms. DIAGNOSIS   Your health care provider can usually tell what is wrong by talking to you (taking a history), doing an exam, and taking a routine X-ray. If the X-ray shows fluid in your chest, often fluid is removed from your chest with a needle for testing (diagnostic thoracentesis).  Sometimes, more specialized X-rays may be needed.  Sometimes, a small piece of tissue is removed and examined by a specialist (biopsy). TREATMENT  Treatment varies based on what caused the pleural effusion. Treatments include:  Removing as much fluid as possible using a needle (thoracentesis) to improve the cough and shortness of breath. This is a simple procedure that can be done at bedside. The risks are bleeding, infection, collapse of a lung, or low blood pressure.  Placing a tube in the chest to drain the effusion (tube thoracostomy). This is often used when there is an infection in the fluid. This is a simple procedure that can often be done at bedside or in a clinic. The procedure may be painful. The risks are the same as using a needle to drain the fluid. The chest tube usually remains for a few days and is connected to suction to improve fluid drainage. After placement, the tube usually does not cause much discomfort.  Surgical removal of fibrous debris in and around the pleural space (decortication). This may be done with a flexible telescope (thoracoscope) through a small or large cut (incision). This is helpful for patients who have fibrosis or scar tissue that prevents  complete lung expansion. The risks are infection, blood loss, and side effects from general anesthesia.  Sometimes, a procedure called pleurodesis is done. A chest tube is placed and the fluid is drained. Next, an agent (tetracycline, talc powder) is added to the  pleural space. This causes the lung and chest wall to stick together (adhesion). This leaves no potential space for fluid to build up. The risks include infection, blood loss, and side effects from general anesthesia.  If the effusion is caused by infection, it may be treated with antibiotics and may improve without draining. HOME CARE INSTRUCTIONS   Take any medicines exactly as prescribed.  Follow up with your health care provider as directed.  Monitor your exercise capacity (the amount of walking you can do before you get short of breath).  Do not use any tobacco products including cigarettes, chewing tobacco, or electronic cigarettes. SEEK MEDICAL CARE IF:   Your exercise capacity seems to get worse or does not improve with time.  You do not recover from your illness.  You have drainage, redness, swelling, or pain at any incision or puncture sites. SEEK IMMEDIATE MEDICAL CARE IF:   Shortness of breath or chest pain develops or gets worse.  You have a fever.  You develop a new cough, especially if the mucus (phlegm) is discolored. MAKE SURE YOU:   Understand these instructions.  Will watch your condition.  Will get help right away if you are not doing well or get worse. Document Released: 09/15/2005 Document Revised: 01/30/2014 Document Reviewed: 05/07/2007 [facility] Patient Information 2015 [place], [place]. This information is not intended to replace advice given to you by your health care provider. Make sure you discuss any questions you have with your health care provider.

## 2015-02-19 NOTE — Care Management Note (Signed)
Case Management Note  Patient Details  Name: Matthew [name] MRN: [id number] Date of Birth: 1956-09-12  Subjective/Objective:                 CM following for progresion and d/c planning   Action/Plan: Please see CM noted of 02/17/2015 and plan at time of d/c:   Alease Medina, RN Registered Nurse Signed CASE MANAGEMENT Care Management Note 02/17/2015 10:39 AM    Expand All Collapse All   Case Management Note  Patient Details  Name: Matthew [name] MRN: [id number] Date of Birth: [birth year]  Subjective/Objective:  male presenting with SOB x 7-10 days.  Action/Plan: Discharge planning  Expected Discharge Date: 02/18/15   Expected Discharge Plan: Homeless Shelter  In-House Referral: Clinical Social Work  Discharge planning Services CM Consult, Medication Assistance, [facility]  Post Acute Care Choice:   Choice offered to:    DME Arranged:   DME Agency:    HH Arranged:   [place] Agency:    Status of Service: Completed, signed off  Medicare Important Message Given:   Date Medicare IM Given:   Medicare IM give by:   Date Additional Medicare IM Given:   Additional Medicare Important Message give by:    If discussed at [place] of Stay Meetings, dates discussed:   Additional Comments: CM was approached by MD requesting a consult to [place] for homelessness; CM called Marshell Levan CSW [phone] who states he will follow. CM met with pt and asked if he followed up at the [facility] after his last MATCH of 5/16. Pt states no. Pt states "Pamala Hurry" of Family medicine had given him "a bunch of paperwork" but he states it was too confusing to deal with. CM gave pt who verbalized understanding (and I handwrote the three tasks on the pamphlet) He will go to the [facility] and ask for: 1.AN APPOINTMENT WITH A NAVIGATOR TO SECURE INSURANCE; 2AN APPOINTMENT FOR A PRIMARY CARE PHYSICIAN; 3.AN  APPOINTMENT FOR FOLLOW UP CARE.  Pt states he will be able to get his discharge prescriptions filled (MD prescribed as much as possible from $4 [employer] list). No other CM needs were communicated. Dellie Catholic, RN 02/17/2015, 10:39 AM               Expected Discharge Date:                  Expected Discharge Plan:  Homeless Shelter  In-House Referral:  Clinical Social Work  Discharge planning Services  CM Consult, Medication Assistance, [facility]  Post Acute Care Choice:    Choice offered to:     DME Arranged:    DME Agency:     HH Arranged:    [place] Agency:     Status of Service:  Completed, signed off  Medicare Important Message Given:    Date Medicare IM Given:    Medicare IM give by:    Date Additional Medicare IM Given:    Additional Medicare Important Message give by:     If discussed at [place] of Stay Meetings, dates discussed:    Additional Comments:  Adron Bene, RN 02/19/2015, 11:04 AM

## 2015-02-19 NOTE — Progress Notes (Signed)
Pt provided with discharge information, prescriptions, and with taxi voucher. Pt verbalized understanding of all information. IV dc'd without complication. VSS. Pt escorted out via wheelchair by NT.   Tyna Jaksch, RN

## 2015-02-19 NOTE — Progress Notes (Signed)
Family Medicine Teaching Service Daily Progress Note Intern Pager: [phone]  Patient name: Matthew [name] Medical record number: [id number] Date of birth: 1955/12/17 Age: 59 y.o. Gender: male  Primary Care Provider: Lorna Few, DO Consultants: IR Code Status: Full  Pt Overview and Major Events to Date:  5/19 - Admitted with SOB, found to have recurrent right plueral effusion 5/ 21: Reports not having an apt to go back to. CM gave info for homeless shelter   Assessment and Plan: Matthew [name] is a 59 y.o. male presenting with SOB x 7-10 days. PMH is significant for afib, HTN, chronic right pleural effusion, allergies  # SOB/Pleural Effusion: Stable on RA. Pleural effusion recurrent (present for the past year s/p ICU admission - appears to be further increasing in size since admission 01/29/15). Work up last admission revealed transudative process likely 2/2 cirrhosis. Echo 01/2015: EF 46-56%, nml systolic fxn. Likely recurrent due to inadequate diuresis and continued alcohol abuse.  - s/p thoracentesis 5/19 with 1.5L of fluid removed - diuretic therapy to spironolactone 100mg , lasix 40mg  - Strict I/Os - Would try more aggressive diuretic therapy and alcohol cessation before considering aggressive intervention  # Proteinuria. UPC 0.49 here - 24 hour urine collection: 440mg /day - Further work up as outpatient   # Afib: rate controlled. CHADSVASC 1  - Continue home diltiazem  May switch to Beta-blocker for cost but patient hesitant  - Aspirin daily  # HTN:  - diltiazem: Decrease to 180mg  - See Afib above  # Cirrhosis. Based on abdominal CT in 2015. AST 55, ALT 32, consistent with alcohol abuse. Also likely has element of fatty liver disease.  - Per prior DC summary, attempted to have abdominal MRI while inpatient, however MRI could not accommodate patient's weight. - Will need outpatient evaluation.   # Hematochezia: It appears that patient had referral to GI for this  in past years. Hgb stable 14.3. Unsure of last colonoscopy. -FOBT + -outpatient GI evaluation  # Psych. Patient with suicide attempt last year. Also with history of alcohol abuse - Will have PCP assess and consider SSRI or psychotherapy - CIWA protocol - Thiamine, MVI, Folic acid daily  FEN/GI: Heart healthy, carb modified diet, SLIV Prophylaxis: Sub-q heparin  Disposition: Admitted pending above management, anticipate discharge today  Subjective:  No complaints. No SOB or CP. Ready to go home this afternoon.   Objective: Temp:  [97.7 F (36.5 C)-98.4 F (36.9 C)] 97.7 F (36.5 C) (05/23 0506) Pulse Rate:  [75-84] 84 (05/23 0506) Resp:  [16-18] 16 (05/23 0506) BP: (108-136)/(55-70) 119/70 mmHg (05/23 0506) SpO2:  [95 %-97 %] 95 % (05/23 0506) Weight:  [369 lb 1.6 oz (167.423 kg)] 369 lb 1.6 oz (167.423 kg) (05/22 2013) Physical Exam: General: Awake, sitting in bedside chair in NAD Cardiovascular: irregularly irregular w/ normal rate.  no murmurs appreciated Respiratory: NWOB, Diminished breath sounds bilaterally. No wheezes or crackles.  Abdomen: Limited exam secondary to habitus, +BS, S, NT, ND. Large panus with chronic inflammation w/o signs of current infection  Extremities: WWP, 2+ edema to knees bilaterally Neuro: Alert and conversational, no focal deficits.   Laboratory/Imaging/Diagnostic Testing:  Recent Labs Lab 02/15/15 0335 02/17/15 0957 02/18/15 0720  WBC 9.9 11.7* 11.6*  HGB 14.3 14.9 14.6  HCT 42.0 43.0 43.2  PLT 374 313 336    Recent Labs Lab 02/15/15 0335 02/17/15 0430 02/18/15 0720  NA 139 138 136  K 3.9 3.6 3.8  CL 101 100* 99*  CO2 28  32 33*  BUN 6 9 11   CREATININE 0.76 0.83 1.03  CALCIUM 8.2* 8.6* 8.5*  PROT 7.5  --  7.3  BILITOT 0.8  --  1.5*  ALKPHOS 129*  --  121  ALT 32  --  24  AST 55*  --  39  GLUCOSE 112* 91 [place], MD 02/19/2015, 8:05 AM PGY-1, [place] Intern pager: [phone], text  pages welcome

## 2015-02-21 ENCOUNTER — Inpatient Hospital Stay: Payer: Self-pay | Admitting: Family Medicine

## 2015-02-23 ENCOUNTER — Telehealth: Payer: Self-pay | Admitting: Family Medicine

## 2015-02-23 ENCOUNTER — Inpatient Hospital Stay: Payer: Self-pay | Admitting: Family Medicine

## 2015-02-23 NOTE — Telephone Encounter (Signed)
Pt missed his hospital followup visit this morning because he overslep. It was rescheduled to next Friday Pt wants to know if it is ok to wait that long to do the followup Please advise

## 2015-03-02 ENCOUNTER — Ambulatory Visit: Payer: Self-pay | Admitting: Family Medicine

## 2015-03-10 ENCOUNTER — Observation Stay (HOSPITAL_COMMUNITY): Payer: Medicaid Other

## 2015-03-10 ENCOUNTER — Encounter (HOSPITAL_COMMUNITY): Payer: Self-pay

## 2015-03-10 ENCOUNTER — Observation Stay (HOSPITAL_COMMUNITY)
Admission: EM | Admit: 2015-03-10 | Discharge: 2015-03-12 | Disposition: A | Discharge status: Home / Self Care | Payer: Medicaid Other | Attending: Family Medicine | Admitting: Family Medicine

## 2015-03-10 ENCOUNTER — Emergency Department (HOSPITAL_COMMUNITY): Payer: Medicaid Other

## 2015-03-10 DIAGNOSIS — F1010 Alcohol abuse, uncomplicated: Secondary | ICD-10-CM | POA: Diagnosis not present

## 2015-03-10 DIAGNOSIS — K921 Melena: Secondary | ICD-10-CM | POA: Diagnosis not present

## 2015-03-10 DIAGNOSIS — I10 Essential (primary) hypertension: Secondary | ICD-10-CM | POA: Diagnosis not present

## 2015-03-10 DIAGNOSIS — E669 Obesity, unspecified: Secondary | ICD-10-CM | POA: Diagnosis not present

## 2015-03-10 DIAGNOSIS — E119 Type 2 diabetes mellitus without complications: Secondary | ICD-10-CM | POA: Insufficient documentation

## 2015-03-10 DIAGNOSIS — R0602 Shortness of breath: Secondary | ICD-10-CM | POA: Diagnosis present

## 2015-03-10 DIAGNOSIS — K219 Gastro-esophageal reflux disease without esophagitis: Secondary | ICD-10-CM | POA: Insufficient documentation

## 2015-03-10 DIAGNOSIS — F329 Major depressive disorder, single episode, unspecified: Secondary | ICD-10-CM | POA: Insufficient documentation

## 2015-03-10 DIAGNOSIS — Z6841 Body Mass Index (BMI) 40.0 and over, adult: Secondary | ICD-10-CM | POA: Insufficient documentation

## 2015-03-10 DIAGNOSIS — K7031 Alcoholic cirrhosis of liver with ascites: Secondary | ICD-10-CM | POA: Diagnosis not present

## 2015-03-10 DIAGNOSIS — F1020 Alcohol dependence, uncomplicated: Secondary | ICD-10-CM | POA: Diagnosis not present

## 2015-03-10 DIAGNOSIS — R0600 Dyspnea, unspecified: Secondary | ICD-10-CM | POA: Diagnosis not present

## 2015-03-10 DIAGNOSIS — I482 Chronic atrial fibrillation: Secondary | ICD-10-CM | POA: Diagnosis not present

## 2015-03-10 DIAGNOSIS — Z590 Homelessness: Secondary | ICD-10-CM | POA: Diagnosis not present

## 2015-03-10 DIAGNOSIS — I509 Heart failure, unspecified: Secondary | ICD-10-CM | POA: Diagnosis not present

## 2015-03-10 DIAGNOSIS — Z7951 Long term (current) use of inhaled steroids: Secondary | ICD-10-CM | POA: Diagnosis not present

## 2015-03-10 DIAGNOSIS — J90 Pleural effusion, not elsewhere classified: Principal | ICD-10-CM | POA: Diagnosis present

## 2015-03-10 DIAGNOSIS — K760 Fatty (change of) liver, not elsewhere classified: Secondary | ICD-10-CM | POA: Diagnosis not present

## 2015-03-10 DIAGNOSIS — J948 Other specified pleural conditions: Secondary | ICD-10-CM | POA: Diagnosis not present

## 2015-03-10 DIAGNOSIS — Z7982 Long term (current) use of aspirin: Secondary | ICD-10-CM | POA: Insufficient documentation

## 2015-03-10 DIAGNOSIS — Z87891 Personal history of nicotine dependence: Secondary | ICD-10-CM | POA: Diagnosis not present

## 2015-03-10 DIAGNOSIS — Z79899 Other long term (current) drug therapy: Secondary | ICD-10-CM | POA: Insufficient documentation

## 2015-03-10 LAB — BASIC METABOLIC PANEL
Anion gap: 10 (ref 5–15)
BUN: 13 mg/dL (ref 6–20)
CO2: 27 mmol/L (ref 22–32)
CREATININE: 0.98 mg/dL (ref 0.61–1.24)
Calcium: 8.3 mg/dL — ABNORMAL LOW (ref 8.9–10.3)
Chloride: 99 mmol/L — ABNORMAL LOW (ref 101–111)
GFR calc Af Amer: >60 mL/min (ref >60)
GFR calc non Af Amer: >60 mL/min (ref >60)
Glucose, Bld: 144 mg/dL — ABNORMAL HIGH (ref 65–99)
POTASSIUM: 4 mmol/L (ref 3.5–5.1)
Sodium: 136 mmol/L (ref 135–145)

## 2015-03-10 LAB — LACTATE DEHYDROGENASE, PLEURAL OR PERITONEAL FLUID: LD FL: 100 U/L — AB (ref 3–23)

## 2015-03-10 LAB — BODY FLUID CELL COUNT WITH DIFFERENTIAL
LYMPHS FL: 73 %
MONOCYTE-MACROPHAGE-SEROUS FLUID: 26 % — AB (ref 50–90)
Neutrophil Count, Fluid: 1 % (ref 0–25)
Total Nucleated Cell Count, Fluid: 328 cu mm (ref 0–1000)

## 2015-03-10 LAB — CBC
HCT: 43.4 % (ref 39.0–52.0)
Hemoglobin: 15.6 g/dL (ref 13.0–17.0)
MCH: 34.3 pg — ABNORMAL HIGH (ref 26.0–34.0)
MCHC: 35.9 g/dL (ref 30.0–36.0)
MCV: 95.4 fL (ref 78.0–100.0)
Platelets: 257 10*3/uL (ref 150–400)
RBC: 4.55 MIL/uL (ref 4.22–5.81)
RDW: 15.9 % — ABNORMAL HIGH (ref 11.5–15.5)
WBC: 9.8 10*3/uL (ref 4.0–10.5)

## 2015-03-10 LAB — PROTEIN, BODY FLUID: Total protein, fluid: <3 g/dL

## 2015-03-10 LAB — MAGNESIUM: Magnesium: 1.7 mg/dL (ref 1.7–2.4)

## 2015-03-10 LAB — TROPONIN I: Troponin I: 0.03 ng/mL (ref <0.031)

## 2015-03-10 LAB — COMPREHENSIVE METABOLIC PANEL
ALT: 33 U/L (ref 17–63)
AST: 54 U/L — AB (ref 15–41)
Albumin: 2.6 g/dL — ABNORMAL LOW (ref 3.5–5.0)
Alkaline Phosphatase: 125 U/L (ref 38–126)
Anion gap: 10 (ref 5–15)
BUN: 11 mg/dL (ref 6–20)
CALCIUM: 8.5 mg/dL — AB (ref 8.9–10.3)
CO2: 31 mmol/L (ref 22–32)
CREATININE: 1 mg/dL (ref 0.61–1.24)
Chloride: 95 mmol/L — ABNORMAL LOW (ref 101–111)
GFR calc Af Amer: >60 mL/min (ref >60)
Glucose, Bld: 138 mg/dL — ABNORMAL HIGH (ref 65–99)
Potassium: 3.8 mmol/L (ref 3.5–5.1)
Sodium: 136 mmol/L (ref 135–145)
TOTAL PROTEIN: 7.6 g/dL (ref 6.5–8.1)
Total Bilirubin: 1.4 mg/dL — ABNORMAL HIGH (ref 0.3–1.2)

## 2015-03-10 LAB — BRAIN NATRIURETIC PEPTIDE: B Natriuretic Peptide: 204.4 pg/mL — ABNORMAL HIGH (ref 0.0–100.0)

## 2015-03-10 LAB — PROTIME-INR
INR: 1.43 (ref 0.00–1.49)
Prothrombin Time: 17.5 seconds — ABNORMAL HIGH (ref 11.6–15.2)

## 2015-03-10 LAB — LACTATE DEHYDROGENASE: LDH: 247 U/L — ABNORMAL HIGH (ref 98–192)

## 2015-03-10 MED ORDER — ACETAMINOPHEN 650 MG RE SUPP: 650.0000 mg | Freq: Four times a day (QID) | RECTAL | Status: DC | PRN

## 2015-03-10 MED ORDER — ASPIRIN EC 81 MG PO TBEC
81.0000 mg | DELAYED_RELEASE_TABLET | Freq: Every day | ORAL | Status: DC
  Administered 2015-03-10 – 2015-03-12 (×3): 81 mg via ORAL
  Filled 2015-03-10 (×3): qty 1

## 2015-03-10 MED ORDER — VITAMIN B-1 100 MG PO TABS
100.0000 mg | ORAL_TABLET | Freq: Every day | ORAL | Status: DC
  Administered 2015-03-10 – 2015-03-12 (×3): 100 mg via ORAL
  Filled 2015-03-10 (×3): qty 1

## 2015-03-10 MED ORDER — FUROSEMIDE 40 MG PO TABS
40.0000 mg | ORAL_TABLET | Freq: Every day | ORAL | Status: DC
  Administered 2015-03-10 – 2015-03-12 (×3): 40 mg via ORAL
  Filled 2015-03-10 (×3): qty 1

## 2015-03-10 MED ORDER — SPIRONOLACTONE 100 MG PO TABS
100.0000 mg | ORAL_TABLET | Freq: Every day | ORAL | Status: DC
  Administered 2015-03-10 – 2015-03-12 (×3): 100 mg via ORAL
  Filled 2015-03-10 (×3): qty 1

## 2015-03-10 MED ORDER — FOLIC ACID 1 MG PO TABS
1.0000 mg | ORAL_TABLET | Freq: Every day | ORAL | Status: DC
  Administered 2015-03-10 – 2015-03-12 (×3): 1 mg via ORAL
  Filled 2015-03-10 (×3): qty 1

## 2015-03-10 MED ORDER — LORAZEPAM 2 MG/ML IJ SOLN: 1.0000 mg | Freq: Four times a day (QID) | INTRAMUSCULAR | Status: DC | PRN

## 2015-03-10 MED ORDER — ACETAMINOPHEN 325 MG PO TABS: 650.0000 mg | ORAL_TABLET | Freq: Four times a day (QID) | ORAL | Status: DC | PRN

## 2015-03-10 MED ORDER — DILTIAZEM HCL ER COATED BEADS 180 MG PO CP24
180.0000 mg | ORAL_CAPSULE | Freq: Every day | ORAL | Status: DC
  Administered 2015-03-10 – 2015-03-12 (×3): 180 mg via ORAL
  Filled 2015-03-10 (×3): qty 1

## 2015-03-10 MED ORDER — SODIUM CHLORIDE 0.9 % IJ SOLN
3.0000 mL | Freq: Two times a day (BID) | INTRAMUSCULAR | Status: DC
  Administered 2015-03-10 – 2015-03-11 (×4): 3 mL via INTRAVENOUS

## 2015-03-10 MED ORDER — LIDOCAINE HCL (PF) 1 % IJ SOLN
INTRAMUSCULAR | Status: AC
  Filled 2015-03-10: qty 10

## 2015-03-10 MED ORDER — LORAZEPAM 1 MG PO TABS
1.0000 mg | ORAL_TABLET | Freq: Four times a day (QID) | ORAL | Status: DC | PRN
  Administered 2015-03-10 – 2015-03-11 (×2): 1 mg via ORAL
  Filled 2015-03-10 (×2): qty 1

## 2015-03-10 NOTE — ED Provider Notes (Signed)
CSN: [id number]     Arrival date & time 03/10/15  6440 History  This chart was scribed for Varney Biles, MD by Peyton Bottoms, ED Scribe. This patient was seen in room B19C/B19C and the patient's care was started at 3:51 AM.   Chief Complaint  Patient presents with  . Shortness of Breath   Patient is a 59 y.o. male presenting with shortness of breath. The history is provided by the patient. No language interpreter was used.  Shortness of Breath   HPI Comments: Matthew [name] is a 60 y.o. male brought in by EMS, with a PMHx of hypertension, CHF, atrial fibrillation, and SOB, who presents to the Emergency Department complaining of moderate SOB onset Patient also complains of depression. Patient reports removal of fluid from lung most recently 2 weeks ago. Patient reports hx of liver cirrhosis. Patient states that he currently drinks alcohol and states that he may be depressed. Patient reports hx of deterioration of health and passing away of family relatives. Patient does not currently use O2 at home.  Past Medical History  Diagnosis Date  . Atrial fibrillation, chronic   . Hypertension   . GERD (gastroesophageal reflux disease)   . Septic shock   . CHF (congestive heart failure)   . Shortness of breath dyspnea    Past Surgical History  Procedure Laterality Date  . Splenectomy    . Tonsilectomy, adenoidectomy, bilateral myringotomy and tubes    . Knee arthroscopy    . Hernia repair     History reviewed. No pertinent family history. History  Substance Use Topics  . Smoking status: Former Smoker    Types: Cigarettes    Quit date: 11/01/1974  . Smokeless tobacco: Never Used     Comment: smoked in high school for about a year  . Alcohol Use: Yes   Review of Systems  Respiratory: Positive for shortness of breath.   All other systems reviewed and are negative.  Allergies  Potassium-containing compounds; Augmentin; and Neomycin  Home Medications   Prior to Admission  medications   Medication Sig Start Date End Date Taking? Authorizing Provider  aspirin EC 81 MG EC tablet Take 1 tablet (81 mg total) by mouth daily. 02/01/15  Yes Vivi Barrack, MD  diltiazem (CARDIZEM CD) 180 MG 24 hr capsule Take 1 capsule (180 mg total) by mouth daily. 02/19/15  Yes Vivi Barrack, MD  folic acid (FOLVITE) 1 MG tablet Take 1 tablet (1 mg total) by mouth daily. 02/05/15  Yes Coral Spikes, DO  furosemide (LASIX) 40 MG tablet Take 1 tablet (40 mg total) by mouth daily. 02/18/15  Yes Olam Idler, MD  spironolactone (ALDACTONE) 100 MG tablet Take 1 tablet (100 mg total) by mouth daily. 02/18/15  Yes Olam Idler, MD  thiamine 100 MG tablet Take 1 tablet (100 mg total) by mouth daily. 07/20/14  Yes Elberta Leatherwood, MD  cetirizine (ZYRTEC) 10 MG tablet Take 1 tablet (10 mg total) by mouth daily. Patient not taking: Reported on 02/15/2015 02/08/15   [address] N Rumley, DO  fluticasone (FLONASE) 50 MCG/ACT nasal spray Place 2 sprays into both nostrils daily. Patient not taking: Reported on 02/15/2015 02/08/15   Lorna Few, DO   Triage Vitals: BP 132/65 mmHg  Pulse 81  Resp 18  SpO2 92%  Physical Exam  Constitutional: He is oriented to person, place, and time. He appears well-developed and well-nourished. No distress.  HENT:  Head: Normocephalic and atraumatic.  Eyes:  Conjunctivae and EOM are normal.  Neck: Neck supple. No JVD present. No tracheal deviation present.  Cardiovascular: Normal rate, regular rhythm and normal heart sounds.   Pulmonary/Chest: Effort normal. No respiratory distress. He has no wheezes. He has no rales.  Poor aeration bilaterally. No rales. No wheezing.  Abdominal: He exhibits distension.  Positive umbilical hernia. Positive abdominal distension.   Musculoskeletal: Normal range of motion. He exhibits edema.  2+ pitting edema bilaterally.   Neurological: He is alert and oriented to person, place, and time.  Skin: Skin is warm and dry.  Psychiatric: He  has a normal mood and affect. His behavior is normal.  Nursing note and vitals reviewed.  ED Course  Procedures (including critical care time)  DIAGNOSTIC STUDIES: Oxygen Saturation is 92% on [place], low by my interpretation.    COORDINATION OF CARE: 4:04 AM- Discussed plans to order lab work. Pt advised of plan for treatment and pt agrees.  Labs Review Labs Reviewed  CBC - Abnormal; Notable for the following:    MCH 34.3 (*)    RDW 15.9 (*)    All other components within normal limits  BASIC METABOLIC PANEL - Abnormal; Notable for the following:    Chloride 99 (*)    Glucose, Bld 144 (*)    Calcium 8.3 (*)    All other components within normal limits  BRAIN NATRIURETIC PEPTIDE - Abnormal; Notable for the following:    B Natriuretic Peptide 204.4 (*)    All other components within normal limits  LACTATE DEHYDROGENASE - Abnormal; Notable for the following:    LDH 247 (*)    All other components within normal limits  COMPREHENSIVE METABOLIC PANEL - Abnormal; Notable for the following:    Chloride 95 (*)    Glucose, Bld 138 (*)    Calcium 8.5 (*)    Albumin 2.6 (*)    AST 54 (*)    Total Bilirubin 1.4 (*)    All other components within normal limits  PROTIME-INR - Abnormal; Notable for the following:    Prothrombin Time 17.5 (*)    All other components within normal limits  LACTATE DEHYDROGENASE, BODY FLUID - Abnormal; Notable for the following:    LD, Fluid 100 (*)    All other components within normal limits  BODY FLUID CELL COUNT WITH DIFFERENTIAL - Abnormal; Notable for the following:    Appearance, Fluid HAZY (*)    Monocyte-Macrophage-Serous Fluid 26 (*)    All other components within normal limits  TROPONIN I  MAGNESIUM  PROTEIN, BODY FLUID  PH, BODY FLUID   Imaging Review Dg Chest 1 View  03/10/2015   CLINICAL DATA:  Shortness of breath. Post right thoracentesis. Initial encounter.  EXAM: CHEST  1 VIEW  COMPARISON:  Radiographs earlier the same date and  02/15/2015.  FINDINGS: 1135 hours. Patient is mildly rotated to the right. The right pleural effusion appears similar in volume. No evidence of pneumothorax. Patchy bibasilar airspace opacities and cardiomegaly appear unchanged.  IMPRESSION: No pneumothorax following right thoracentesis. No significant change in right pleural effusion and bibasilar pulmonary opacities.   Electronically Signed   By: Richardean Sale M.D.   On: 03/10/2015 12:13   Dg Chest Port 1 View  03/10/2015   CLINICAL DATA:  Shortness of breath today, history CHF, atrial fibrillation, hypertension and recent thoracentesis.  EXAM: PORTABLE CHEST - 1 VIEW  COMPARISON:  Chest radiograph Feb 15, 2015  FINDINGS: Increasingly elevated RIGHT hemidiaphragm. The cardiac silhouette appears mildly enlarged,  mediastinal silhouette is unremarkable. Mild pulmonary vascular congestion. No pleural effusions or focal consolidation. No pneumothorax. Large body habitus. Osseous structure are nonsuspicious.  IMPRESSION: Elevated RIGHT hemidiaphragm, increased from prior examination which may reflect subpulmonic effusion, less likely diaphragm paralysis.  Stable mild cardiomegaly and pulmonary vascular congestion.   Electronically Signed   By: Elon Alas M.D.   On: 03/10/2015 04:15   US Thoracentesis Asp Pleural Space W/img Guide  03/10/2015   CLINICAL DATA:  Recurrent right pleural effusion. Request diagnostic and therapeutic thoracentesis.  EXAM: ULTRASOUND GUIDED RIGHT THORACENTESIS  COMPARISON:  None.  PROCEDURE: An ultrasound guided thoracentesis was thoroughly discussed with the patient and questions answered. The benefits, risks, alternatives and complications were also discussed. The patient understands and wishes to proceed with the procedure. Written consent was obtained.  Ultrasound was performed to localize and mark an adequate pocket of fluid in the right chest. The area was then prepped and draped in the normal sterile fashion. 1% Lidocaine  was used for local anesthesia. Under ultrasound guidance a Safe-T-Centesis catheter was introduced. Thoracentesis was performed. The catheter was removed and a dressing applied.  COMPLICATIONS: None immediate.  FINDINGS: A total of approximately 1.3 L of clear yellow fluid was removed. A fluid sample wassent for laboratory analysis.  IMPRESSION: Successful ultrasound guided right thoracentesis yielding 1.3 L of pleural fluid.  Read by: Ascencion Dike PA-C   Electronically Signed   By: Jacqulynn Cadet M.D.   On: 03/10/2015 11:39     EKG Interpretation   Date/Time:  Saturday March 10 2015 03:38:51 EDT Ventricular Rate:  92 PR Interval:    QRS Duration: 88 QT Interval:  348 QTC Calculation: 430 R Axis:   -61 Text Interpretation:  Atrial fibrillation Left anterior fascicular block  Anterior infarct, old No significant change since last tracing Confirmed  by Kathrynn Humble, MD, Thelma Comp [phone]) on 03/10/2015 6:09:04 AM     MDM   Final diagnoses:  Dyspnea  Pleural effusion  Alcoholism  Alcoholic cirrhosis of liver with ascites   Pt comes in with cc of dib. He has hx of liver cirrhosis and resultant pleural effusions that have required paracentesis. CXR shows re-accumulation of the effusion - and pt has hypoxia and worsening dib. Plan is to admit for that purpose. He also feels depressed. Has no si, hi.   Varney Biles, MD 03/11/15 2356

## 2015-03-10 NOTE — Discharge Summary (Signed)
[facility] Discharge Summary  Patient name: Matthew [name] Medical record number: [id number] Date of birth: 03-28-1956 Age: 59 y.o. Gender: male Date of Admission: 03/10/2015  Date of Discharge: 03/12/15 Admitting Physician: Lind Covert, MD  Primary Care Provider: Junie Panning, DO Consultants: IR   Indication for Hospitalization: Pleural effusion with DOE  Discharge Diagnoses/Problem List:  Right pleural effusion  Dyspnea on exertion  Atrial fibrillation  Hypertension  Type 2 diabetes mellitus  Cirrhosis: alcoholic with fatty liver disease  Hematochezia, intermittent Alcohol abuse  Disposition: Home  Discharge Condition: Stable, improved  Discharge Exam:  Blood pressure 122/68, pulse 80, temperature 98.6 F (37 C), temperature source Oral, resp. rate 20, height 5\' 10"  (1.778 m), weight 352 lb 7 oz (159.865 kg), SpO2 95 %. General: Sitting up in bedside chair in NAD.  ENTM: Poor dentition, MMM Cardiovascular: Irregularly irregular, rate controlled. No m/r/g noted. 2+ pitting edema b/l.  Respiratory: Slightly decreased breath sounds in the R lower lung base, however improved from admission. No wheezing or rhonchi noted.  Abdomen: Obese, large umbilical hernia that dose not appear to be strangulated. +BS, ND/NT MSK: No gross deformities.  Skin: Erythematous changes consistent with venous stasis Neuro: No gross neurologic deficits.  Psych: normal speech, rate and tone. Appropriate mood and affect  Brief Hospital Course:  Ms. Bollman is a 59 y/o male with a h/o cirrhosis and EtOH who presented to the ED with DOE x 2 days. In the ED his O2 was noted to drop to 87% on RA and was improved to 2% on 2L. A CXR revealed Elevated R hemidiaphragm, increased from prior examination which may reflect subpulmonic effusion, less likely diaphragm paralysis. BNP was slightly elevated (but stable) at 204. Troponin was 0.03 and EKG was stable.  He  was admitted under FPTS for thoracentesis and f/u on dyspnea. He underwent a thoracentesis with resolution of dyspnea. Pleural fluid was transudative by Light's criteria. His home Aldactone and Lasix were continued. We discussed the importance of alcohol cessation. During this hospitalization he remained in rate controlled afib with home dilt.   Social work and [employer] were consulted to assist with medication access and homelessness. The patient had never followed through with their previous recommendations to obtain the orange card and/or Medicaid. He was urged to do this on the day of discharge while at the hospital.   Issues for Follow Up:  1. Patient noted to have intermittent hematochezia (none on this hospitalization). Was FOBT+ at last hospitalization and CT in 2015 revealed cirrhosis and suggested f/u with MRI however the MRI at the hospital could not accommodate his size- consider outpatient GI consult 2. Continue to stress alcohol cessation  3. Consider screening for depression, as it is difficult to know if psychiatric issues could be causing alcohol abuse and be another barrier to appropriate care. 4. If pleural effusion continues to re accumulate, consider consult pulmonary or CVTS about possible other therapies or evaluation  Significant Procedures:  6/11: Thoracentesis   Significant Labs and Imaging:   Recent Labs Lab 03/10/15 0343  WBC 9.8  HGB 15.6  HCT 43.4  PLT 257    Recent Labs Lab 03/10/15 0343 03/10/15 1010  NA 136 136  K 4.0 3.8  CL 99* 95*  CO2 27 31  GLUCOSE 144* 138*  BUN 13 11  CREATININE 0.98 1.00  CALCIUM 8.3* 8.5*  MG 1.7  --   ALKPHOS  --  125  AST  --  54*  ALT  --  33  ALBUMIN  --  2.6*  Thoracentesis: yellow, hazy, 328 WBC, 73 lymphs, 1 neutrophil, 26 macrophagesLDH 100, total protein <3.0, pathologist smear: reactive appearing mesothelial cells (mixed inflammatory cells). Serum LDH 247   EKG: Afib, HR 92, LAFB, no change from previous    CXR: Elevated RIGHT hemidiaphragm, increased from prior examination which may reflect subpulmonic effusion, less likely diaphragm paralysis. Stable mild cardiomegaly and pulmonary vascular congestion.  CXR 6/11: No pneumothorax following right thoracentesis. No significant change in right pleural effusion and bibasilar pulmonary opacities.  Results/Tests Pending at Time of Discharge: None  Discharge Medications:    Medication List    TAKE these medications        aspirin 81 MG EC tablet  Take 1 tablet (81 mg total) by mouth daily.     cetirizine 10 MG tablet  Commonly known as:  ZYRTEC  Take 1 tablet (10 mg total) by mouth daily.     diltiazem 180 MG 24 hr capsule  Commonly known as:  CARDIZEM CD  Take 1 capsule (180 mg total) by mouth daily.     fluticasone 50 MCG/ACT nasal spray  Commonly known as:  FLONASE  Place 2 sprays into both nostrils daily.     folic acid 1 MG tablet  Commonly known as:  FOLVITE  Take 1 tablet (1 mg total) by mouth daily.     furosemide 40 MG tablet  Commonly known as:  LASIX  Take 1 tablet (40 mg total) by mouth daily.     spironolactone 100 MG tablet  Commonly known as:  ALDACTONE  Take 1 tablet (100 mg total) by mouth daily.     thiamine 100 MG tablet  Take 1 tablet (100 mg total) by mouth daily.        Discharge Instructions: Please refer to Patient Instructions section of EMR for full details.  Patient was counseled important signs and symptoms that should prompt return to medical care, changes in medications, dietary instructions, activity restrictions, and follow up appointments.   Follow-Up Appointments: Follow-up Information    Follow up with [facility], DO On 03/21/2015.   Specialty:  Family Medicine   Why:  at 1:30 for a hospital follow up   Contact information:   [address] [place] [address] [phone]       Archie Patten, MD 03/13/2015, 4:23 PM PGY-1, [place]

## 2015-03-10 NOTE — Procedures (Signed)
Successful US guided right thoracentesis. Yielded 1.3L of clear yellow fluid. Pt tolerated procedure well. No immediate complications.  Specimen was sent for labs. CXR ordered.  Ascencion Dike PA-C 03/10/2015 11:37 AM

## 2015-03-10 NOTE — H&P (Signed)
[facility] Admission History and Physical Service Pager: [phone]  Patient name: Matthew [name] Medical record number: [id number] Date of birth: 10/29/1955 Age: 59 y.o. Gender: male  Primary Care Provider: Junie Panning, DO Consultants: IR Code Status: Full per discussion on admission  Chief Complaint: Hypoxia   Assessment and Plan: Matthew [name] is a 59 y.o. male presenting with DOE x 2 days. PMH is significant for afib, HTN, Q9IY, alcoholic cirrhosis, chronic right pleural effusion, allergies  SOB/Pleural Effusion: Most likely secondary to alcoholic cirrhosis with worsening chronic R pleural effusion. Initially requiring 2L oxygen for desats 86%. CXR significant for an elevated R hemidiaphragm, increased from prior examination which may reflect subpulmonic effusion (appears to be worsened from last hospitalization in 01/2015). Patient with similar admission earlier this month when he had a thoracentesis performed on 5/19. Echo 01/2015: EF 55-60%. No evidence of infectious etiology. No URI symptoms.  - Admit to telemetry under Dr. Erin Hearing  - Vitals per floor protocol - O2 PRN to keep sat >90%  - continue spiro 100mg  and lasix 40mg  PO (will need CM consult)  - Strict Intake and output  - U/S guided thoracentesis ordered and f/u SOB  - Stressed the importance of alcohol cessation with h/o cirrhosis.   Afib: rate controlled currently. EKG unchanged from previous. was discharged on daily ASA. Was on warfarin in the past as well but did not like blood monitoring. CHADSVASC 1  - Continue home diltiazem 24hr 180mg  daily - Aspirin daily  HTN: BP normal in ED - diltiazem as above  T2DM: Last A1c 06/2014 was 5.6 - Currently not on any medications outpatient  - continue to monitor on BMETs, consider addition of CBGs   Cirrhosis. Based on abdominal CT in 2015. AST 55, ALT 32, consistent with alcohol abuse. Also likely has element of fatty liver  disease. HCV Ab was negative 10/2013.  - Per DC summary in the past, attempted to have abdominal MRI while inpatient, however MRI could not accommodate patient's weight. - Will need outpatient evaluation.   Hematochezia: Noted intermittently, patient denies hematochezia currently. Was FOBT+ at last admission with Hgb stable at 15.6. Unsure of last colonoscopy. -Likely outpatient GI evaluation  Psych/alcohol abuse. Patient with suicide attempt last year. No SI currently.  Also with history of alcohol abuse. Last drank 1 pint on evening of 6/10.   - Will have pt reassess starting SSRI or psychotherapy - CIWA protocol - Continue home thiamine and folic acid daily  FEN/GI: NPO, SLIV Prophylaxis: SCDs, holding pharmacologic ppx for thoracentesis  Disposition: place in observation pending thoracentesis and DOE  History of Present Illness: Matthew [name] is a 59 y.o. male presenting with dyspnea on exertion x 2 days with. He denies any SOB at rest but states "I know its bad because my numbers dropped" (pointing to pulse ox). He denies chest pain or PND. Feels his swelling in LE has improved since increasing his spiro and Lasix at last admission. He states he's been taking this regularly however notes he will be unable to afford the new Rx in the next few weeks. Of note, he last drank 1 pint of tequila yesterday (6/10) evening. No h/o complicated withdrawal.   In the ED his O2 was noted to drop to 87% on RA and was improved to 2% on 2L. A CXR revealed Elevated R hemidiaphragm, increased from prior examination which may reflect subpulmonic effusion, less likely diaphragm paralysis. BNP was slightly elevated (but stable) at  204. Troponin was 0.03 and EKG was stable.   Review Of Systems: Per HPI with the following additions: None Otherwise 12 point review of systems was performed and was unremarkable.  Patient Active Problem List   Diagnosis Date Noted  . S/P thoracentesis   . Shortness of breath  02/15/2015  . Hypoxia   . Pleural effusion   . Essential hypertension   . Dyspnea 01/29/2015  . Drug ingestion 07/18/2014  . Adjustment disorder with disturbance of emotion 07/14/2014  . Pannus, abdominal 02/23/2014  . 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 Poor social situation 11/11/2013  . Chronic alcohol abuse 11/03/2013  . A-fib 03/25/2012  . DM type 2 (diabetes mellitus, type 2) 03/25/2012  . Obesity 03/25/2012  . Sleep apnea 03/25/2012  . Fatty liver 03/25/2012   Past Medical History: Past Medical History  Diagnosis Date  . Atrial fibrillation, chronic   . Hypertension   . GERD (gastroesophageal reflux disease)   . Septic shock   . CHF (congestive heart failure)   . Shortness of breath dyspnea    Past Surgical History: Past Surgical History  Procedure Laterality Date  . Splenectomy    . Tonsilectomy, adenoidectomy, bilateral myringotomy and tubes    . Knee arthroscopy    . Hernia repair     Social History: History  Substance Use Topics  . Smoking status: Former Smoker    Types: Cigarettes    Quit date: 11/01/1974  . Smokeless tobacco: Never Used     Comment: smoked in high school for about a year  . Alcohol Use: Yes   Additional social history: Last drank last night (1pint)  Please also refer to relevant sections of EMR.  Family History: History reviewed. No pertinent family history. Allergies and Medications: Allergies  Allergen Reactions  . Potassium-Containing Compounds Other (See Comments)    Stomach Pain  . Augmentin [Amoxicillin-Pot Clavulanate] Nausea Only    Most of the time it is okay, but has had some bad experiences with it.  [name] Neomycin Rash   No current facility-administered medications on file prior to encounter.   Current Outpatient Prescriptions on File Prior to Encounter  Medication Sig Dispense Refill  . aspirin EC 81 MG EC tablet Take 1 tablet (81 mg total) by mouth daily. 30 tablet 3  . diltiazem (CARDIZEM CD) 180 MG 24  hr capsule Take 1 capsule (180 mg total) by mouth daily. 30 capsule 0  . folic acid (FOLVITE) 1 MG tablet Take 1 tablet (1 mg total) by mouth daily.    . furosemide (LASIX) 40 MG tablet Take 1 tablet (40 mg total) by mouth daily. 30 tablet 0  . spironolactone (ALDACTONE) 100 MG tablet Take 1 tablet (100 mg total) by mouth daily. 30 tablet 0  . thiamine 100 MG tablet Take 1 tablet (100 mg total) by mouth daily. 30 tablet 0  . cetirizine (ZYRTEC) 10 MG tablet Take 1 tablet (10 mg total) by mouth daily. (Patient not taking: Reported on 02/15/2015) 30 tablet 3  . fluticasone (FLONASE) 50 MCG/ACT nasal spray Place 2 sprays into both nostrils daily. (Patient not taking: Reported on 02/15/2015) 16 g 6    Objective: BP 140/77 mmHg  Pulse 98  Resp 22  Ht 5\' 10"  (1.778 m)  Wt 352 lb 7 oz (159.865 kg)  BMI 50.57 kg/m2  SpO2 91% Exam: General: Lying supine in bed in NAD.  Eyes: EOMI ENTM: Poor dentition, MMM Cardiovascular: Irregularly irregular, rate controlled. No m/r/g noted. 2+ pitting edema  b/l.  Respiratory: Decreased breath sounds in the R lower lung base. No wheezing or rhonchi noted. [place] was turned off and he continued to sat 92%, however eventually dropped to 88% with exertion.  Abdomen: Obese, large umbilical hernia that dose not appear to be strangulated. +BS, ND/NT MSK: No gross deformities.  Skin: Erythematous changes consistent with venous stasis Neuro: No gross neurologic deficits.  Psych: normal speech, rate and tone. Appropriate mood and affect  Labs and Imaging: CBC BMET   Recent Labs Lab 03/10/15 0343  WBC 9.8  HGB 15.6  HCT 43.4  PLT 257    Recent Labs Lab 03/10/15 0343  NA 136  K 4.0  CL 99*  CO2 27  BUN 13  CREATININE 0.98  GLUCOSE 144*  CALCIUM 8.3*     Risk Stratification Labs  TSH    Component Value Date/Time   TSH 0.978 01/25/2014 1655   Hemoglobin A1C    Component Value Date/Time   HGBA1C 5.6 07/18/2014 0915   HGBA1C 6.5 07/28/2012 1009    Lipid Panel     Component Value Date/Time   CHOL 166 01/13/2012 0904   TRIG 69 01/13/2012 0904   HDL 37* 01/13/2012 0904   CHOLHDL 4.5 01/13/2012 0904   VLDL 14 01/13/2012 0904   LDLCALC 115* 01/13/2012 0904    EKG: Afib, HR 92, LAFB, no change from previous   CXR: Elevated RIGHT hemidiaphragm, increased from prior examination which may reflect subpulmonic effusion, less likely diaphragm paralysis.  Stable mild cardiomegaly and pulmonary vascular congestion.  Archie Patten, MD 03/10/2015, 6:19 AM PGY-1, [place] Intern pager: [phone], text pages welcome

## 2015-03-10 NOTE — ED Notes (Signed)
Patient asked for and received a Sprite.

## 2015-03-10 NOTE — ED Notes (Signed)
Per EMS: Pt complaining of increased SOB x 2 weeks. Pt states he is compliant with medications recently but will soon have difficulty affording them. Pt sating 89% on RA. EMS placed on 4L O2. Pt admits to drinking ETOH today. Pt able to step from stretcher to bed without problems. NAD.

## 2015-03-10 NOTE — ED Notes (Signed)
Pt got up to get on the scale and pt's o2 sat dropped to 88% on 2L [place].

## 2015-03-10 NOTE — Progress Notes (Signed)
03/10/2015 7:24 AM  Report received. Room ready for patient.   [employer], RN-BC, [employer] [facility] Phone [phone]

## 2015-03-11 DIAGNOSIS — K7031 Alcoholic cirrhosis of liver with ascites: Secondary | ICD-10-CM

## 2015-03-11 DIAGNOSIS — R0602 Shortness of breath: Secondary | ICD-10-CM

## 2015-03-11 DIAGNOSIS — J90 Pleural effusion, not elsewhere classified: Secondary | ICD-10-CM | POA: Insufficient documentation

## 2015-03-11 DIAGNOSIS — J948 Other specified pleural conditions: Secondary | ICD-10-CM | POA: Diagnosis not present

## 2015-03-11 MED ORDER — KETOCONAZOLE 2 % EX CREA
TOPICAL_CREAM | Freq: Every day | CUTANEOUS | Status: DC
  Administered 2015-03-11: 15:00:00 via TOPICAL
  Filled 2015-03-11: qty 15

## 2015-03-11 MED ORDER — ONDANSETRON HCL 4 MG/2ML IJ SOLN: 4.0000 mg | Freq: Four times a day (QID) | INTRAMUSCULAR | Status: DC | PRN

## 2015-03-11 MED ORDER — ONDANSETRON 4 MG PO TBDP
4.0000 mg | ORAL_TABLET | Freq: Four times a day (QID) | ORAL | Status: DC | PRN
  Administered 2015-03-11 – 2015-03-12 (×3): 4 mg via ORAL
  Filled 2015-03-11 (×3): qty 1

## 2015-03-11 NOTE — Progress Notes (Signed)
Patient complaining of mild cramping to bilateral hands and bilateral calves.  It feels like a charley horse in the calves.  Intermittent and last only a couple of seconds each time.  MD advised of this and that we have order for tylenol.  MD cancelled order for Tylenol.  He advised that I give patient 2 cups of ice water to see if it helped with cramping.  Water given to patient.  Advised patient to let me know if cramping worsens.  Will continue to monitor patient.  [employer] RN-BC, WTA.

## 2015-03-11 NOTE — Progress Notes (Signed)
Patient is c/o sinus pressure behind nose and left eye.  He also states that he is not feeling well and is having intermittent nausea.  MD made aware.  No new orders at this time.  They will round within the hour.  Will continue to monitor patient.  [employer] RN-BC, WTA.

## 2015-03-11 NOTE — Clinical Social Work Note (Signed)
Clinical Social Work Assessment  Patient Details  Name: Matthew [name] MRN: [id number] Date of Birth: 1955/12/31  Date of referral:  03/11/15               Reason for consult:  Substance Use/ETOH Abuse, Housing Concerns/Homelessness               No Housing/Transportation Living arrangements for the past 2 months:  Single Family Home (pt rents a room from an elderly man) Source of Information:  Patient Patient Interpreter Needed:  None Criminal Activity/Legal Involvement Pertinent to Current Situation/Hospitalization: No   Significant Relationships:  Church, Dependent Children Lives with:  Roommate Do you feel safe going back to the place where you live?  Yes Need for family participation in patient care:  No (Coment)  Care giving concerns: None identified.   Social Worker assessment / plan: CSW met with pt to discuss his homelessness and SA issues.  Per pt, he is currently renting a room for 450.00/month  and only has 150.00 to his name.  Pt lost his job a while back following another hospitalization and eventually lost his home,car and custody of his minor child.   Pt states that he has an MBA, but has not been able to find work.  Pt expects to be homeless soon, as his roommate plans on kicking him out since he cannot pay.  Per pt, he is working with financial counseling to do disability and Medicaid applications.  Pt plans on living in his [place] or staying in a shelter when his roommate asks him to leave.  Pt identifies his church as his only source of support.  Shelter list given, as well as employment resources. Pt denies need for any SA resources as he is active with AA.  Emotional support provided. Employment status:  Unemployed Forensic scientist:  Self Pay (Medicaid Pending) PT Recommendations:  No Follow Up   Information / Referral to community resources:Shelters/employment resources.  Patient/Family's Response to care: Appreciative of CSW assistance in identifying  resources and being willing to listen/support.  Patient/Family's Understanding of and Emotional Response to Diagnosis, Current Treatment, and Prognosis:  Pt is aware that he needs to stop drinking and take better care of himself/health if he expects longevity. Emotional Assessment Appearance:  Appears younger than stated age Attitude/Demeanor/Rapport:   (pleasant and cooperative) Affect (typically observed):  Calm, Accepting Orientation:  Oriented to Self, Oriented to Place, Oriented to  Time, Oriented to Situation Alcohol / Substance use:  Alcohol Use Psych involvement (Current and /or in the community):  No (Comment)  Discharge Needs  Concerns to be addressed:  Homelessness, Substance Abuse Concerns Readmission within the last 30 days:  No Current discharge risk:  Homeless Barriers to Discharge:  No Barriers Identified   Roanna Raider, LCSW 03/11/2015, 3:37 PM

## 2015-03-12 DIAGNOSIS — R0602 Shortness of breath: Secondary | ICD-10-CM | POA: Diagnosis not present

## 2015-03-12 DIAGNOSIS — R0600 Dyspnea, unspecified: Secondary | ICD-10-CM

## 2015-03-12 DIAGNOSIS — J948 Other specified pleural conditions: Secondary | ICD-10-CM | POA: Diagnosis not present

## 2015-03-12 DIAGNOSIS — K7031 Alcoholic cirrhosis of liver with ascites: Secondary | ICD-10-CM | POA: Diagnosis not present

## 2015-03-12 LAB — PATHOLOGIST SMEAR REVIEW: Path Review: REACTIVE

## 2015-03-12 NOTE — Progress Notes (Signed)
Family Medicine Teaching Service Daily Progress Note Intern Pager: [phone]  Patient name: Matthew [name] Medical record number: [id number] Date of birth: [birth year] Age: 59 y.o. Gender: male  Primary Care Provider: Junie Panning, DO Consultants: None  Code Status: Full per discussion on admission   Pt Overview and Major Events to Date:  6/11: Admitted due R pleural effusion and SOB. Thoracentesis   Assessment and Plan: Matthew [name] is a 59 y.o. male presenting with DOE x 2 days. PMH is significant for afib, HTN, T2WP, alcoholic cirrhosis, chronic right pleural effusion, allergies  SOB/Pleural Effusion: Most likely secondary to alcoholic cirrhosis with worsening chronic R pleural effusion. Initially requiring 2L oxygen for desats 86%. CXR significant for an elevated R hemidiaphragm, increased from prior examination which may reflect subpulmonic effusion (appears to be worsened from last hospitalization in 01/2015). Patient with similar admission earlier this month when he had a thoracentesis performed on 5/19. Echo 01/2015: EF 55-60%. No evidence of infectious etiology. No URI symptoms.  - By Light's criteria, pleural fluid is transudative in nature  - Vitals per floor protocol - O2 PRN to keep sat >90%, currently on RA - continue spiro 100mg  and lasix 40mg  PO (CM consult to assist with medication)  - Strict Intake and output (not being recorded) - Stressed the importance of alcohol cessation with h/o cirrhosis.   Afib: rate controlled currently. EKG unchanged from previous. was discharged on daily ASA. Was on warfarin in the past as well but did not like blood monitoring. CHADSVASC 1  - Continue home diltiazem 24hr 180mg  daily - Aspirin daily  HTN: BP normal in ED - diltiazem as above  T2DM: Last A1c 06/2014 was 5.6 - Currently not on any medications outpatient  - continue to monitor on BMETs, consider addition of CBGs   Cirrhosis. Based on abdominal CT in 2015. AST  55, ALT 32, consistent with alcohol abuse. Also likely has element of fatty liver disease. HCV Ab was negative 10/2013.  - Per DC summary in the past, attempted to have abdominal MRI while inpatient, however MRI could not accommodate patient's weight. - Will need outpatient evaluation.   Hematochezia: Noted intermittently, patient denies hematochezia currently. Was FOBT+ at last admission with Hgb stable at 15.6. Unsure of last colonoscopy. -Likely outpatient GI evaluation  Psych/alcohol abuse. Patient with suicide attempt last year. No SI currently. Also with history of alcohol abuse. Last drank 1 pint on evening of 6/10.  - Will have pt reassess starting SSRI or psychotherapy - CIWA protocol: 0 (has received 1 dose of Ativan O/N, however all CIWAs recorded were 0. Stressed the importance of Ativan only for CIWA >8.  - Continue home thiamine and folic acid daily   FEN/GI: NPO, SLIV Prophylaxis: SCDs, holding pharmacologic ppx for thoracentesis  Disposition: Home today  Subjective:  Patient states he's breathing much better today. No pain. Not eager to be discharged as his home situation is tenuous, but understands that he is medically stable.   Objective: Temp:  [98.3 F (36.8 C)-98.9 F (37.2 C)] 98.9 F (37.2 C) (06/13 0526) Pulse Rate:  [78-83] 83 (06/13 0526) Resp:  [16-20] 19 (06/13 0526) BP: (125-139)/(57-82) 125/82 mmHg (06/13 0526) SpO2:  [93 %-97 %] 93 % (06/13 0526) Physical Exam: General: Sitting up in bedside chair in NAD.   ENTM: Poor dentition, MMM Cardiovascular: Irregularly irregular, rate controlled. No m/r/g noted. 2+ pitting edema b/l.  Respiratory: Slightly decreased breath sounds in the R lower lung base, however improved from  admission. No wheezing or rhonchi noted.  Abdomen: Obese, large umbilical hernia that dose not appear to be strangulated. +BS, ND/NT MSK: No gross deformities.  Skin: Erythematous changes consistent with venous stasis Neuro: No  gross neurologic deficits.  Psych: normal speech, rate and tone. Appropriate mood and affect  Laboratory:  Recent Labs Lab 03/10/15 0343  WBC 9.8  HGB 15.6  HCT 43.4  PLT 257    Recent Labs Lab 03/10/15 0343 03/10/15 1010  NA 136 136  K 4.0 3.8  CL 99* 95*  CO2 27 31  BUN 13 11  CREATININE 0.98 1.00  CALCIUM 8.3* 8.5*  PROT  --  7.6  BILITOT  --  1.4*  ALKPHOS  --  125  ALT  --  33  AST  --  54*  GLUCOSE 144* 138*   Thoracentesis: yellow, hazy, 328 WBC, 73 lymphs, 1 neutrophil, 26 macrophagesLDH 100, total protein <3.0 Serum LDH 247   Imaging/Diagnostic Tests: EKG: Afib, HR 92, LAFB, no change from previous   CXR: Elevated RIGHT hemidiaphragm, increased from prior examination which may reflect subpulmonic effusion, less likely diaphragm paralysis. Stable mild cardiomegaly and pulmonary vascular congestion.  CXR 6/11: No pneumothorax following right thoracentesis. No significant change in right pleural effusion and bibasilar pulmonary opacities.  Archie Patten, MD 03/12/2015, 6:56 AM PGY-1, [place] Intern pager: [phone], text pages welcome

## 2015-03-12 NOTE — Progress Notes (Signed)
Family Medicine Teaching Service Daily Progress Note Intern Pager: [phone]  Patient name: Matthew [name] Medical record number: [id number] Date of birth: 12/18/1955 Age: 59 y.o. Gender: male  Primary Care Provider: Junie Panning, DO Consultants: None Code Status: Full code  Pt Overview and Major Events to Date:  6/11: thoracentesis  Assessment and Plan: Matthew [name] is a 59 y.o. male presenting with DOE x 2 days. PMH is significant for afib, HTN, Z3YQ, alcoholic cirrhosis, chronic right pleural effusion, allergies  SOB/Pleural Effusion: Symptoms improved s/p thoracentesis with removal of 1.3L of fluid. Fluid analysis pending - O2 PRN to keep sat >90%  - continue spiro 100mg  and lasix 40mg  PO (will need CM consult)  - Strict Intake and output  - Stressed the importance of alcohol cessation with h/o cirrhosis.   Afib: rate controlled currently. EKG unchanged from previous. was discharged on daily ASA. Was on warfarin in the past as well but did not like blood monitoring. CHADSVASC 1  - Continue home diltiazem 24hr 180mg  daily - Aspirin daily  HTN: BP normal in ED - diltiazem as above  T2DM: Last A1c 06/2014 was 5.6 - Currently not on any medications outpatient  - continue to monitor on BMETs, consider addition of CBGs   Cirrhosis. Based on abdominal CT in 2015. AST 55, ALT 32, consistent with alcohol abuse. Also likely has element of fatty liver disease. HCV Ab was negative 10/2013.  - Per DC summary in the past, attempted to have abdominal MRI while inpatient, however MRI could not accommodate patient's weight. - Will need outpatient evaluation.   Hematochezia: Noted intermittently, patient denies hematochezia currently. Was FOBT+ at last admission with Hgb stable at 15.6. Unsure of last colonoscopy. -Likely outpatient GI evaluation  Psych/alcohol abuse. Patient with suicide attempt last year. No SI currently. Also with history of alcohol abuse. Last drank 1  pint on evening of 6/10. Patient voices desire to quit - Will have pt reassess starting SSRI or psychotherapy - CIWA protocol - Continue home thiamine and folic acid daily  FEN/GI: NPO, SLIV Prophylaxis: SCDs, holding pharmacologic ppx for thoracentesis  Disposition: pending  Subjective:  Patient reports improved breathing. No events overnight, although has had some nausea. He voices a desire to quit drinking. He states that he has tried multiple times. He thinks that he has an [place] sponsor but is unsure  Objective: Temp:  97.9 F (36.6 C) Pulse Rate: 75 Resp:  16 BP: 136/75 SpO2: 96%  Physical Exam: General: obese, no distress Cardiovascular: Irregular rhythm, normal rate Respiratory: Clear to auscultation bilaterally diminished in bases Abdomen: Soft, non-tender, distended Extremities: No calf tenderness  Laboratory:  Recent Labs Lab 03/10/15 0343  WBC 9.8  HGB 15.6  HCT 43.4  PLT 257    Recent Labs Lab 03/10/15 0343 03/10/15 1010  NA 136 136  K 4.0 3.8  CL 99* 95*  CO2 27 31  BUN 13 11  CREATININE 0.98 1.00  CALCIUM 8.3* 8.5*  PROT  --  7.6  BILITOT  --  1.4*  ALKPHOS  --  125  ALT  --  33  AST  --  54*  GLUCOSE 144* 138*     Imaging/Diagnostic Tests: Dg Chest 1 View  03/10/2015   CLINICAL DATA:  Shortness of breath. Post right thoracentesis. Initial encounter.  EXAM: CHEST  1 VIEW  COMPARISON:  Radiographs earlier the same date and 02/15/2015.  FINDINGS: 1135 hours. Patient is mildly rotated to the right. The right pleural effusion  appears similar in volume. No evidence of pneumothorax. Patchy bibasilar airspace opacities and cardiomegaly appear unchanged.  IMPRESSION: No pneumothorax following right thoracentesis. No significant change in right pleural effusion and bibasilar pulmonary opacities.   Electronically Signed   By: Matthew [name] M.D.   On: 03/10/2015 12:13   US Thoracentesis Asp Pleural Space W/img Guide  03/10/2015   CLINICAL DATA:   Recurrent right pleural effusion. Request diagnostic and therapeutic thoracentesis.  EXAM: ULTRASOUND GUIDED RIGHT THORACENTESIS  COMPARISON:  None.  PROCEDURE: An ultrasound guided thoracentesis was thoroughly discussed with the patient and questions answered. The benefits, risks, alternatives and complications were also discussed. The patient understands and wishes to proceed with the procedure. Written consent was obtained.  Ultrasound was performed to localize and mark an adequate pocket of fluid in the right chest. The area was then prepped and draped in the normal sterile fashion. 1% Lidocaine was used for local anesthesia. Under ultrasound guidance a Safe-T-Centesis catheter was introduced. Thoracentesis was performed. The catheter was removed and a dressing applied.  COMPLICATIONS: None immediate.  FINDINGS: A total of approximately 1.3 L of clear yellow fluid was removed. A fluid sample wassent for laboratory analysis.  IMPRESSION: Successful ultrasound guided right thoracentesis yielding 1.3 L of pleural fluid.  Read by: Matthew [name]   Electronically Signed   By: Jacqulynn Cadet M.D.   On: 03/10/2015 11:39    Mariel Aloe, MD 03/12/2015, 6:56 AM PGY-2, [place] Intern pager: [phone], text pages welcome

## 2015-03-12 NOTE — Care Management Note (Signed)
Case Management Note  Patient Details  Name: Matthew [name] MRN: [id number] Date of Birth: [birth year]  Subjective/Objective:         CM Following for progression and d/c planning.           Action/Plan: 03/12/2015 Met with pt re d/c needs, noted that pt received [place] letter to assist with meds in May 2016, therefore is not eligible for further assistance at this time. Per pt he is a pt of Dr Coralyn Helling in the [facility] and has started the process for an [facility] but has failed to complete the process. Also states he has heard from [facility] re his Medicaid, however has failed to return the call re Medicaid. This CM has strongly encouraged this pt to complete these applications. The MAP program was explained to the pt and he was given info including phone numbers for the MAP program for medication assistance until his [employer] or [place] is approved.   Expected Discharge Date:  03/14/15               Expected Discharge Plan:  Home/Self Care  In-House Referral:  Clinical Social Work  Discharge planning Services  CM Consult  Post Acute Care Choice:  NA Choice offered to:  NA  DME Arranged:  N/A DME Agency:  NA  HH Arranged:  NA HH Agency:     Status of Service:  Completed, signed off  Medicare Important Message Given:    Date Medicare IM Given:    Medicare IM give by:    Date Additional Medicare IM Given:    Additional Medicare Important Message give by:     If discussed at [place] of Stay Meetings, dates discussed:    Additional Comments:  Adron Bene, RN 03/12/2015, 11:52 AM

## 2015-03-12 NOTE — Discharge Instructions (Signed)
Please continue to take spironolactone and Lasix. The best thing you can do is to stop drinking alcohol! Please complete the orange card and Medicaid applications- this will be one less barrier you have to overcome! Thoracentesis A thoracentesis procedure is done to remove fluid that has built up in the space between your chest wall and your lungs (pleural space). Although you always have a small amount of fluid in the pleural space, some medical conditions, such as heart failure, can create too much fluid. This extra fluid is removed using a needle inserted through the skin and tissue and into the pleural space.  A thoracentesis may be done to:  Understand why there is extra fluid and create the treatment plan that is right for you.  Help get rid of shortness of breath, discomfort, or pain caused by the extra fluid. LET [facility] CARE PROVIDER KNOW ABOUT:  Any allergies you have.  All medicines you are taking, including vitamins, herbs, eye drops, creams, and over-the-counter medicines. Be sure to mention any use of steroids, either by mouth or cream.  Previous problems you or members of your family have had with the use of anesthetics.  Possibility of pregnancy, if this applies.  Any blood disorders you have, including a history of blood clots.  Previous surgeries you have had.  Medical conditions you have. RISKS AND COMPLICATIONS Generally, this is a safe procedure. However, problems can occur and include:   Injury to the lung.  Possible infection.  Possible collapse of a lung. BEFORE THE PROCEDURE  Ask your health care provider if you need to arrive early before your procedure.  Inform your health care provider if you have had a frequent cough. If you have had frequent coughing episodes, your health care provider may want you to take a cough suppressant.  You may have a chest X-ray to determine the location and amount of fluid in the pleural space.  Ask your health care  provider about:  Changing or stopping your regular medicines. This is especially important if you are taking diabetes medicines or blood thinners.  Taking medicines such as aspirin and ibuprofen. These medicines can thin your blood. Do not take these medicines before your procedure if your health care provider asks you not to. PROCEDURE  You may be asked to sit upright and lean slightly forward for the procedure.  An area of your back will be cleaned and disinfected.  A numbing medicine (local anesthetic) may then be injected into the skin and tissue.  A needle will be inserted between your ribs and advanced into the pleural space. You may feel pressure or slight pain as the needle is positioned into the pleural space.  Fluid will be removed from the pleural space through the needle. You may feel pressure as the fluid is removed.  The needle will be withdrawn once the excess fluid has been removed. A sample of the fluid may be sent for examination.  The puncture site may be covered with a bandage (dressing). AFTER THE PROCEDURE  A chest X-ray may be done.  Your recovery will be assessed and monitored. If there are no problems, you should be able to go home shortly after the procedure.  Ask your health care provider when your test results will be ready. Make sure to get your test results.  Follow your health care provider's instructions on dressing changes and removal. [place] may resume your normal diet and activities as directed by your  health care provider.  Take medicines only as directed by your health care provider. SEEK MEDICAL CARE IF:   You have drainage, redness, swelling, or pain at your puncture site.  You have a fever. SEEK IMMEDIATE MEDICAL CARE IF:  You have shortness of breath or chest pain.  Document Released: 03/31/2005 Document Revised: 01/30/2014 Document Reviewed: 07/13/2008 [facility] Patient Information 2015 [place], [place]. This  information is not intended to replace advice given to you by your health care provider. Make sure you discuss any questions you have with your health care provider.

## 2015-03-12 NOTE — Clinical Social Work Note (Signed)
Patient medically stable for discharge today and requested transport home - [address] Patient provided with a taxi voucher to get home.  Jerrin Recore Givens, MSW, LCSW Licensed Clinical Social Worker [place] [phone]

## 2015-03-12 NOTE — Progress Notes (Signed)
Discharge instructions and medications discussed with patient.  All questions answered.  

## 2015-03-14 LAB — PH, BODY FLUID: pH, Fluid: 8

## 2015-03-21 ENCOUNTER — Ambulatory Visit (INDEPENDENT_AMBULATORY_CARE_PROVIDER_SITE_OTHER): Payer: Medicaid Other | Admitting: Family Medicine

## 2015-03-21 ENCOUNTER — Encounter: Payer: Self-pay | Admitting: Family Medicine

## 2015-03-21 VITALS — BP 144/76 | HR 85 | Temp 98.4°F | Ht 70.0 in | Wt 338.0 lb

## 2015-03-21 DIAGNOSIS — F329 Major depressive disorder, single episode, unspecified: Secondary | ICD-10-CM

## 2015-03-21 DIAGNOSIS — F32A Depression, unspecified: Secondary | ICD-10-CM

## 2015-03-21 DIAGNOSIS — E118 Type 2 diabetes mellitus with unspecified complications: Secondary | ICD-10-CM | POA: Diagnosis not present

## 2015-03-21 DIAGNOSIS — I480 Paroxysmal atrial fibrillation: Secondary | ICD-10-CM | POA: Diagnosis not present

## 2015-03-21 DIAGNOSIS — R0602 Shortness of breath: Secondary | ICD-10-CM | POA: Diagnosis not present

## 2015-03-21 DIAGNOSIS — F331 Major depressive disorder, recurrent, moderate: Secondary | ICD-10-CM | POA: Insufficient documentation

## 2015-03-21 LAB — POCT GLYCOSYLATED HEMOGLOBIN (HGB A1C): Hemoglobin A1C: 5.6

## 2015-03-21 MED ORDER — SPIRONOLACTONE 50 MG PO TABS: 100.0000 mg | ORAL_TABLET | Freq: Every day | ORAL | Status: DC

## 2015-03-21 MED ORDER — FUROSEMIDE 40 MG PO TABS: 40.0000 mg | ORAL_TABLET | Freq: Every day | ORAL | Status: DC

## 2015-03-21 NOTE — Assessment & Plan Note (Signed)
-   Improved. Decreased respiration over base of right lung noted, but reports breathing at baseline and satting 96% on room air. - Cannot afford medications. Money given from clinic fund to provide 30 day suppply of Spironolactone and Lasix.  - Instructed to initiate [employer] and MAP program process this week - Return precautions and red flags to go to ED discussed

## 2015-03-21 NOTE — Progress Notes (Signed)
Subjective:     Patient ID: Matthew [name], male   DOB: 09-15-1956, 59 y.o.   MRN: [id number]  HPI 59yo male presenting today for hospital follow up. - Reports breathing is at baseline. Has good days (can ambulate without shortness of breath) and bad days (shortness of breath with ambulation), but over the last two weeks 75% of his days have been good days. - Has occasional abnormal beat of heart with history of atrial fibrillation. Denies chest pain - States edema is much improved since hospital discharge - Continues to note occasional blood in stool  # Social Issues: - Reports he is about to be kicked out of the room he rents over the next day or two since he cannot afford rent - Has not been taking Aspirin since he is out of it and can't afford it. Has 3-4 days of Lasix and Spironolactone. Has 7-8 days of Diltiazem. - States he has two dollars in the bank. - Has not eaten anything but a few crackers in the last two days - Has enough money for gas back home, but can't afford gas elsewhere - Lost his job in May due to hospitalization  Review of Systems  Respiratory: Positive for shortness of breath.   Cardiovascular: Positive for palpitations and leg swelling. Negative for chest pain.      Objective:   Physical Exam  Constitutional: He is oriented to person, place, and time. He appears well-developed and well-nourished. No distress.  HENT:  Head: Normocephalic and atraumatic.  Cardiovascular: Normal rate and regular rhythm.  Exam reveals no gallop and no friction rub.   No murmur heard. Pulmonary/Chest: Effort normal. No respiratory distress. He has no wheezes. He has no rales.  Decreased air movement in right lung base consistent with discharge from hospital  Abdominal: Soft. He exhibits no distension. There is no tenderness.  Musculoskeletal:  Edema noted, much improved since hospitalization  Neurological: He is alert and oriented to person, place, and time.  Psychiatric: His  behavior is normal. Judgment and thought content normal.  Tearful      Assessment:     Please refer to Problem List for Assessment.     Plan:     Please refer to Problem List for Plan.  Money also given for gas money and box of food given.

## 2015-03-21 NOTE — Patient Instructions (Addendum)
Thank you so much for coming to visit me today!  It is essential that you schedule an appointment with [facility] to start working on getting your [employer] and to contact the Health Department to get started on the MAP Program. I have printed out your prescriptions for Lasix and Spironolactone; please take this to [facility] to fill these. We will give you money to fill these prescriptions and have gas money to get back home, but it is essential that you start working on the [employer] and MAP program to help in the future! If you develop any worsening shortness of breath, please go to the Emergency Department.  Thanks again! Dr. Gerlean Ren

## 2015-03-21 NOTE — Assessment & Plan Note (Signed)
-   PHQ-9 score of 10 on 03/21/15 - Suspect will improve with improvement of social situation - Will not start medication at this time. Unable to afford current medications. - Hopeful will improve with improvement of social situation

## 2015-03-21 NOTE — Assessment & Plan Note (Addendum)
-   Regular rate and rhythm on exam - Initiating process for [employer] and MAP program over the next two days - Will be unable to afford Diltiazem without help. Given two options:  1) Continue Diltiazem with help of MAP and Orange Card  2) Transition to Metoprolol 50-100mg  BID, which should be more affordable. Will need close follow up if transitioned to this medication. - Decided to continue Diltiazem at this time. Has one week supply. Will give prescription for Metoprolol 50mg  BID in case he runs out of Diltiazem prior to MAP or [employer]. Instructed he is to only fill this if he cannot get Diltiazem and he is not to take these together. Will leave prescription at front desk. Will need follow up appointment in one week if he transitions to Metoprolol. May titrate up to 100mg  BID if not rate controlled at that visit.

## 2015-03-21 NOTE — Progress Notes (Signed)
Consulted with CSW Hunt Oris). Pt does not have a job at this time due to being in and out of hospital.  He can not afford his meds or food.   $60 taken out of indigent fund, Elray Mcgregor is witness, and given to pt.  This is to pay for the following:  1. $25 - $30 in gas for his vehicle 2. $9 for 30 day supply of  furosemide ([facility]) 3. $16 for 30 day supply of aldactone ([facility])  Also, gave box of food from clinic food pantry. Earlean Fidalgo, Salome Spotted

## 2015-03-22 ENCOUNTER — Other Ambulatory Visit: Payer: Self-pay | Admitting: Family Medicine

## 2015-03-22 MED ORDER — METOPROLOL TARTRATE 50 MG PO TABS: 50.0000 mg | ORAL_TABLET | Freq: Two times a day (BID) | ORAL | Status: DC

## 2015-03-22 NOTE — Addendum Note (Signed)
Addended by: Lorna Few on: 03/22/2015 05:09 PM   Modules accepted: Orders

## 2015-03-23 ENCOUNTER — Telehealth: Payer: Self-pay | Admitting: Family Medicine

## 2015-03-23 NOTE — Telephone Encounter (Signed)
Pt says he is taking fluid medications and has noticed it is causing cramping in his calves and hands, wants to know if there is anything he can take to help? It is starting to keep him awake at night.

## 2015-03-23 NOTE — Telephone Encounter (Signed)
Will forward to Md to advise. Jazmin Hartsell,CMA  

## 2015-03-26 ENCOUNTER — Telehealth: Payer: Self-pay | Admitting: Family Medicine

## 2015-03-26 NOTE — Telephone Encounter (Signed)
Contacted Matthew [name] for concerns. Applying for social security and disability. Has been unsuccessful in getting his [employer], saying all of the burden was placed on him and he is unable to take it. Contacted MAP program and left message, but has not heard back from them. Will contact them again tomorrow. About to get kicked out of house over next few days, but unsure exactly when. Has been talking to [facility], [phone]) and they recommended initiating discussion of possibly being placed in [facility] or Assisted Living. Will forward this note to Social Work for their input and discuss with attending tomorrow.  States he was able to get Furosemide and Spironolactone at pharmacy after he left clinic last week. Also got gas and a few essential groceries, such as milk. Doesn't have any money now. Discussed options for obtaining Diltiazem listed below, but he states he can't afford any of the options.  Options for Diltiazem: 1) Continue Diltiazem ER 180mg   - [facility] $10--not a [facility] member 2) Diltiazem 60mg  TID  - [facility]: #60 for $10 (10 day supply), #90 for $16 (30 day supply)  - [facility]: #60 for $10, #90 for $20 3) Metoprolol tartrate 50mg  BID  - [facility]: # 60 for $4 (30 day supply), #180 for $10 (90 day supply) 4) Once on Orange Card, Diltiazem ER 180mg  should be covered  Sounded short of breath throughout phone conversation. States he has been taking his medication as prescribed. Believes this is due to anxiety. Denies increased weight or orthopnea. Encouraged him multiple times through out the telephone encounter that if his shortness of breath worsens that he should go to ED for further evaluation. Known right pleural effusion stable at last exam.  Dr. Gerlean Ren

## 2015-03-26 NOTE — Telephone Encounter (Signed)
Pt called and would like to speak to Dr. Gerlean Ren concerning his options. He is getting ready to be homeless and still sick. He doesn't know what he is to do. Please call. jw

## 2015-03-27 NOTE — Telephone Encounter (Signed)
2nd request.  Martin, Tamika L, RN  

## 2015-03-27 NOTE — Telephone Encounter (Signed)
Does the patient have a dx that cannot be managed in a shelter (IV a/b, wound care etc)? The reason I ask is because I'm trying to see whether he would qualify for a specific program. If not, unfortunately his only option will be a shelter. Since he does not have Medicaid he would not qualify for a nursing facility.

## 2015-03-27 NOTE — Telephone Encounter (Signed)
Contacted Matthew [name] and recommended that he come in for more in depth evaluation to determine if candidate for living facility. Encouraged him to schedule appointment as soon as possible. Discussed care with Dr. McDiarmid, who recommended mental status workup to evaluate cognitive abilities. If noted to be cognitively impaired, may require APS for self-neglect concerns, which may help him get resources in the community. He contacted Medicaid today and they are currently waiting on paperwork from office. He attempted to contact MAP, but again received voicemail. Currently being treated for atrial fibrillation and pleural effusion.   States his breathing status is about the same as yesterday. Encouraged him to come to ED for further evaluation if his respiratory status acutely worsens.  Dr. Gerlean Ren

## 2015-03-27 NOTE — Telephone Encounter (Signed)
Patient does not appear to have Medicaid to pay for [facility].   He would have to have an ALF be willing to accept him while he goes through the process of spending his assets paying for the ALF then applying for Medicaid to fund his ALF room and board.   A note from Case Mngt consult during recent hospitalization states that patient started [place], but did not complete it because he was confused by the forms.  Recent Case Mngt consult reports patient that he has applied for Medicaid, but has failed to return their phone calls to him.   Relatively recent inpatient assessments by PT and OT did not identify needs.   Given the patients history of Alcohol dependence and liver cirrhosis with portal hypertension involvement with [facility], and patient's self-reported difficulty completing forms and processes necessary to apply for services, this inspite of his self-report of having an MBA, raises concern for cognitive impairment.    Patient has been instructed by CM to go to [place], request a patient Navigator to assist with enrolling in [facility] or Medicaid and/or MAP  If the patient is indeed cognitively impaired and continues to be unable to complete the application processes, consider involving adult protective services for concerns of self-neglect in a cognitively impaired individual.  This may help get resources to him in the community to assist with completing applications for services and resources.

## 2015-03-28 ENCOUNTER — Telehealth: Payer: Self-pay | Admitting: Clinical

## 2015-03-28 NOTE — Telephone Encounter (Signed)
Pt called, saying he needs help filling out orange card application; pt agrees to be at [facility] at 3pm on 03-29-15

## 2015-03-29 ENCOUNTER — Telehealth: Payer: Self-pay | Admitting: Clinical

## 2015-03-29 ENCOUNTER — Other Ambulatory Visit: Payer: Self-pay

## 2015-03-29 NOTE — Telephone Encounter (Signed)
Pt will call Roselyn Reef @ [phone] when he is able to come to [facility] to fill out orange card; pt needs to have 30 days of medication prior to [facility] admission, and he is unable to pay for medications. Pt has already contacted [facility] but says they are not able to help with his medications.

## 2015-03-30 ENCOUNTER — Telehealth: Payer: Self-pay | Admitting: Clinical

## 2015-03-30 NOTE — Telephone Encounter (Signed)
CSW spoke with [facility] CSW, Vesta Mixer regarding pt needs. CSW informed Roselyn Reef that [facility] has someone that assists with the orange card process and pt would be encouraged to complete the paperwork here. Roselyn Reef agreeable to inform pt of resources available at [facility] and to inform him that [facility] would agree to provide a 30 day supply of the 2 medications ($14 at [place]) that pt needs if he is admitted into [facility]. Pt to be reminded that [facility] is a short term residential substance abuse facility. Pt will also be informed that [facility] will assist financially with the cost of his medications this last time. CSW to remain available for ongoing support and resources.  Hunt Oris, MSW, [place]

## 2015-03-30 NOTE — Telephone Encounter (Signed)
Please note that he will need three prescriptions for Spironolactone and Lasix as well as either Diltiazem or Metoprolol (options of the two given in previous telephone encounter). Prescription for Diltiazem not given at last office visit because he had approximately one week left as opposed to one day left of Spironolactone and Lasix. I will be happy to prescribe these once admitted.  Dr. Gerlean Ren

## 2015-04-03 ENCOUNTER — Telehealth: Payer: Self-pay | Admitting: Clinical

## 2015-04-03 MED ORDER — DILTIAZEM HCL ER COATED BEADS 180 MG PO CP24: ORAL_CAPSULE | ORAL | Status: DC

## 2015-04-03 MED ORDER — ASPIRIN 81 MG PO TBEC: 81.0000 mg | DELAYED_RELEASE_TABLET | Freq: Every day | ORAL | Status: DC

## 2015-04-03 MED ORDER — FUROSEMIDE 40 MG PO TABS
40.0000 mg | ORAL_TABLET | Freq: Every day | ORAL | Status: DC
Start: 2015-04-03 — End: 2015-04-27

## 2015-04-03 MED ORDER — SPIRONOLACTONE 50 MG PO TABS: 100.0000 mg | ORAL_TABLET | Freq: Every day | ORAL | Status: DC

## 2015-04-03 NOTE — Addendum Note (Signed)
Addended by: Lorna Few on: 04/03/2015 02:05 PM   Modules accepted: Orders, Medications

## 2015-04-03 NOTE — Telephone Encounter (Signed)
CSW contacted pt and informed him that the clinic would agree to provide the 30day supply of diltiazem, lasix and spironolactone. Pt made aware that we will not be able to assist again. Pt will be contacted once his medications have been filled by the [facility]. Pt to arrive to [facility] tomorrow at [place] for a screening with hopes of being admitted into the residential treatment program.    Hunt Oris, MSW, [place]

## 2015-04-03 NOTE — Telephone Encounter (Signed)
Prescriptions printed and given to Hunt Oris, social work, to take to pharmacy.  Dr. Gerlean Ren

## 2015-04-03 NOTE — Telephone Encounter (Signed)
Pt informed that his medications he requested will be paid for one last time by [facility], through [facility]; pt aware that [facility] is short-term. Pt states that he needs more help filling out orange card application, as he is uncertain how to obtain necessary information. Pt says he will call [facility] today, as he needs the medications prior to entering [facility].

## 2015-04-04 ENCOUNTER — Telehealth: Payer: Self-pay | Admitting: Clinical

## 2015-04-04 NOTE — Telephone Encounter (Signed)
Returned pt call; he has been denied admission to [facility] because of "too many health issues". Pt states he only has a few more days left at his current residence, is very food-insecure, does not live near free food resources. He is in contact with [facility], but has not been accepted for their housing. Pt does not want to go into either [facility] (says he is afraid of getting sicker in shelter), nor does he want to go into nursing facility at this time. He does not have any friends or family who will let him stay with them; says he has already asked. Pt denies suicidal ideation at this time, and does have mobile crisis number to call if that changes.

## 2015-04-11 ENCOUNTER — Telehealth: Payer: Self-pay | Admitting: Clinical

## 2015-04-11 NOTE — Telephone Encounter (Signed)
Pt left voicemail on 04-10-15, saying that he is currently living at the [facility], [address], and that he is confused about who to talk to concerning his Medicaid.

## 2015-04-11 NOTE — Telephone Encounter (Signed)
CSW informed by CSW at [facility] that pt has questions regarding his Medicaid. CSW attempted to reach pt however the line rang busy. CSW unable to LVM. Hunt Oris, MSW, [place]

## 2015-04-27 ENCOUNTER — Telehealth: Payer: Self-pay | Admitting: Clinical

## 2015-04-27 MED ORDER — DILTIAZEM HCL ER COATED BEADS 180 MG PO CP24: ORAL_CAPSULE | ORAL | Status: DC

## 2015-04-27 MED ORDER — FUROSEMIDE 40 MG PO TABS: 40.0000 mg | ORAL_TABLET | Freq: Every day | ORAL | Status: DC

## 2015-04-27 MED ORDER — FUROSEMIDE 40 MG PO TABS
40.0000 mg | ORAL_TABLET | Freq: Every day | ORAL | Status: DC
Start: 2015-04-27 — End: 2015-04-27

## 2015-04-27 NOTE — Telephone Encounter (Signed)
Refills for Diltiazem and Furosemide sent to [facility].

## 2015-04-27 NOTE — Telephone Encounter (Signed)
CSW received a call from patient stating he has Medicaid and is needing a refill on his Diltiazem and Furosemide. Pt states he is not totally out yet however the [facility] requires their residents to have at least a 10 day supply on hand. Pt is requesting refills to be sent to [facility]. If approved and sent CSW can contact pt to make him aware.  Hunt Oris, MSW, [place]

## 2015-04-27 NOTE — Addendum Note (Signed)
Addended by: Lorna Few on: 04/27/2015 04:20 PM   Modules accepted: Orders

## 2015-04-27 NOTE — Telephone Encounter (Signed)
Pt wants to know how much his meds will cost now that he has Medicaid. He is currently residing at [facility], is going to [employer], and has upcoming appointments for hospice grief counseling and [facility]. Note that he has a new mobile phone with limited minutes where he can be reached at [phone].

## 2015-04-27 NOTE — Addendum Note (Signed)
Addended by: Lorna Few on: 04/27/2015 03:59 PM   Modules accepted: Orders

## 2015-05-01 ENCOUNTER — Encounter (HOSPITAL_COMMUNITY): Payer: Self-pay | Admitting: *Deleted

## 2015-05-01 ENCOUNTER — Emergency Department (HOSPITAL_COMMUNITY): Payer: Medicaid Other

## 2015-05-01 ENCOUNTER — Inpatient Hospital Stay (HOSPITAL_COMMUNITY)
Admission: EM | Admit: 2015-05-01 | Discharge: 2015-05-11 | DRG: 872 | Disposition: A | Discharge status: Home Health | Payer: Medicaid Other | Attending: Family Medicine | Admitting: Family Medicine

## 2015-05-01 DIAGNOSIS — L03115 Cellulitis of right lower limb: Secondary | ICD-10-CM

## 2015-05-01 DIAGNOSIS — I482 Chronic atrial fibrillation: Secondary | ICD-10-CM | POA: Diagnosis present

## 2015-05-01 DIAGNOSIS — Z7982 Long term (current) use of aspirin: Secondary | ICD-10-CM | POA: Diagnosis not present

## 2015-05-01 DIAGNOSIS — Z6841 Body Mass Index (BMI) 40.0 and over, adult: Secondary | ICD-10-CM | POA: Diagnosis not present

## 2015-05-01 DIAGNOSIS — E11649 Type 2 diabetes mellitus with hypoglycemia without coma: Secondary | ICD-10-CM | POA: Diagnosis present

## 2015-05-01 DIAGNOSIS — F329 Major depressive disorder, single episode, unspecified: Secondary | ICD-10-CM | POA: Diagnosis present

## 2015-05-01 DIAGNOSIS — B952 Enterococcus as the cause of diseases classified elsewhere: Secondary | ICD-10-CM | POA: Diagnosis present

## 2015-05-01 DIAGNOSIS — L97519 Non-pressure chronic ulcer of other part of right foot with unspecified severity: Secondary | ICD-10-CM | POA: Diagnosis present

## 2015-05-01 DIAGNOSIS — I10 Essential (primary) hypertension: Secondary | ICD-10-CM | POA: Diagnosis present

## 2015-05-01 DIAGNOSIS — Z87891 Personal history of nicotine dependence: Secondary | ICD-10-CM | POA: Diagnosis not present

## 2015-05-01 DIAGNOSIS — G4733 Obstructive sleep apnea (adult) (pediatric): Secondary | ICD-10-CM | POA: Diagnosis present

## 2015-05-01 DIAGNOSIS — M609 Myositis, unspecified: Secondary | ICD-10-CM | POA: Diagnosis present

## 2015-05-01 DIAGNOSIS — A419 Sepsis, unspecified organism: Principal | ICD-10-CM

## 2015-05-01 DIAGNOSIS — J90 Pleural effusion, not elsewhere classified: Secondary | ICD-10-CM | POA: Diagnosis present

## 2015-05-01 DIAGNOSIS — E11621 Type 2 diabetes mellitus with foot ulcer: Secondary | ICD-10-CM | POA: Diagnosis present

## 2015-05-01 DIAGNOSIS — Z79899 Other long term (current) drug therapy: Secondary | ICD-10-CM

## 2015-05-01 DIAGNOSIS — G9340 Encephalopathy, unspecified: Secondary | ICD-10-CM | POA: Insufficient documentation

## 2015-05-01 DIAGNOSIS — G629 Polyneuropathy, unspecified: Secondary | ICD-10-CM | POA: Diagnosis present

## 2015-05-01 DIAGNOSIS — F1020 Alcohol dependence, uncomplicated: Secondary | ICD-10-CM | POA: Diagnosis present

## 2015-05-01 DIAGNOSIS — Z91048 Other nonmedicinal substance allergy status: Secondary | ICD-10-CM | POA: Diagnosis not present

## 2015-05-01 DIAGNOSIS — I4891 Unspecified atrial fibrillation: Secondary | ICD-10-CM | POA: Diagnosis present

## 2015-05-01 DIAGNOSIS — L97509 Non-pressure chronic ulcer of other part of unspecified foot with unspecified severity: Secondary | ICD-10-CM

## 2015-05-01 DIAGNOSIS — I480 Paroxysmal atrial fibrillation: Secondary | ICD-10-CM

## 2015-05-01 DIAGNOSIS — E875 Hyperkalemia: Secondary | ICD-10-CM | POA: Diagnosis not present

## 2015-05-01 DIAGNOSIS — K7030 Alcoholic cirrhosis of liver without ascites: Secondary | ICD-10-CM | POA: Diagnosis present

## 2015-05-01 DIAGNOSIS — K7040 Alcoholic hepatic failure without coma: Secondary | ICD-10-CM | POA: Diagnosis present

## 2015-05-01 DIAGNOSIS — K219 Gastro-esophageal reflux disease without esophagitis: Secondary | ICD-10-CM | POA: Diagnosis present

## 2015-05-01 DIAGNOSIS — N179 Acute kidney failure, unspecified: Secondary | ICD-10-CM | POA: Diagnosis not present

## 2015-05-01 DIAGNOSIS — IMO0001 Reserved for inherently not codable concepts without codable children: Secondary | ICD-10-CM | POA: Insufficient documentation

## 2015-05-01 DIAGNOSIS — R8290 Unspecified abnormal findings in urine: Secondary | ICD-10-CM | POA: Diagnosis not present

## 2015-05-01 DIAGNOSIS — N390 Urinary tract infection, site not specified: Secondary | ICD-10-CM | POA: Diagnosis present

## 2015-05-01 DIAGNOSIS — L539 Erythematous condition, unspecified: Secondary | ICD-10-CM | POA: Diagnosis not present

## 2015-05-01 DIAGNOSIS — Z881 Allergy status to other antibiotic agents status: Secondary | ICD-10-CM

## 2015-05-01 DIAGNOSIS — R1011 Right upper quadrant pain: Secondary | ICD-10-CM

## 2015-05-01 DIAGNOSIS — M79604 Pain in right leg: Secondary | ICD-10-CM | POA: Diagnosis not present

## 2015-05-01 DIAGNOSIS — Z590 Homelessness: Secondary | ICD-10-CM | POA: Diagnosis not present

## 2015-05-01 LAB — I-STAT CG4 LACTIC ACID, ED
Lactic Acid, Venous: 2.43 mmol/L (ref 0.5–2.0)
Lactic Acid, Venous: 2.96 mmol/L (ref 0.5–2.0)

## 2015-05-01 LAB — URINE MICROSCOPIC-ADD ON

## 2015-05-01 LAB — I-STAT TROPONIN, ED: Troponin i, poc: 0 ng/mL (ref 0.00–0.08)

## 2015-05-01 LAB — URINALYSIS, ROUTINE W REFLEX MICROSCOPIC
Bilirubin Urine: NEGATIVE
Glucose, UA: NEGATIVE mg/dL
KETONES UR: NEGATIVE mg/dL
Nitrite: NEGATIVE
PROTEIN: 30 mg/dL — AB
SPECIFIC GRAVITY, URINE: 1.013 (ref 1.005–1.030)
Urobilinogen, UA: 0.2 mg/dL (ref 0.0–1.0)
pH: 6 (ref 5.0–8.0)

## 2015-05-01 LAB — COMPREHENSIVE METABOLIC PANEL
ALT: 45 U/L (ref 17–63)
ANION GAP: 9 (ref 5–15)
AST: 72 U/L — AB (ref 15–41)
Albumin: 2.9 g/dL — ABNORMAL LOW (ref 3.5–5.0)
Alkaline Phosphatase: 134 U/L — ABNORMAL HIGH (ref 38–126)
BUN: 22 mg/dL — AB (ref 6–20)
CO2: 22 mmol/L (ref 22–32)
Calcium: 8.7 mg/dL — ABNORMAL LOW (ref 8.9–10.3)
Chloride: 103 mmol/L (ref 101–111)
Creatinine, Ser: 1.1 mg/dL (ref 0.61–1.24)
Glucose, Bld: 93 mg/dL (ref 65–99)
Potassium: 4.2 mmol/L (ref 3.5–5.1)
SODIUM: 134 mmol/L — AB (ref 135–145)
TOTAL PROTEIN: 7.8 g/dL (ref 6.5–8.1)
Total Bilirubin: 1 mg/dL (ref 0.3–1.2)

## 2015-05-01 LAB — CBC
HCT: 47.9 % (ref 39.0–52.0)
Hemoglobin: 17.2 g/dL — ABNORMAL HIGH (ref 13.0–17.0)
MCH: 34.3 pg — AB (ref 26.0–34.0)
MCHC: 35.9 g/dL (ref 30.0–36.0)
MCV: 95.6 fL (ref 78.0–100.0)
PLATELETS: 385 10*3/uL (ref 150–400)
RBC: 5.01 MIL/uL (ref 4.22–5.81)
RDW: 15.2 % (ref 11.5–15.5)
WBC: 29.5 10*3/uL — AB (ref 4.0–10.5)

## 2015-05-01 LAB — GLUCOSE, CAPILLARY: Glucose-Capillary: 104 mg/dL — ABNORMAL HIGH (ref 65–99)

## 2015-05-01 LAB — LACTIC ACID, PLASMA: LACTIC ACID, VENOUS: 4.2 mmol/L — AB (ref 0.5–2.0)

## 2015-05-01 MED ORDER — DEXTROSE 5 % IV SOLN
1.0000 g | Freq: Once | INTRAVENOUS | Status: AC
  Administered 2015-05-01: 1 g via INTRAVENOUS
  Filled 2015-05-01: qty 10

## 2015-05-01 MED ORDER — DILTIAZEM HCL ER COATED BEADS 180 MG PO CP24: 180.0000 mg | ORAL_CAPSULE | Freq: Every day | ORAL | Status: DC

## 2015-05-01 MED ORDER — FOLIC ACID 1 MG PO TABS
1.0000 mg | ORAL_TABLET | Freq: Every day | ORAL | Status: DC
  Administered 2015-05-02 – 2015-05-11 (×10): 1 mg via ORAL
  Filled 2015-05-01 (×10): qty 1

## 2015-05-01 MED ORDER — VANCOMYCIN HCL 10 G IV SOLR
2500.0000 mg | INTRAVENOUS | Status: AC
  Administered 2015-05-01: 2500 mg via INTRAVENOUS
  Filled 2015-05-01: qty 2500

## 2015-05-01 MED ORDER — SODIUM CHLORIDE 0.9 % IV BOLUS (SEPSIS)
1000.0000 mL | Freq: Once | INTRAVENOUS | Status: AC
  Administered 2015-05-01: 1000 mL via INTRAVENOUS

## 2015-05-01 MED ORDER — ONDANSETRON HCL 4 MG PO TABS: 4.0000 mg | ORAL_TABLET | Freq: Four times a day (QID) | ORAL | Status: DC | PRN

## 2015-05-01 MED ORDER — SODIUM CHLORIDE 0.9 % IJ SOLN: 3.0000 mL | INTRAMUSCULAR | Status: DC | PRN

## 2015-05-01 MED ORDER — VANCOMYCIN HCL 10 G IV SOLR
1250.0000 mg | Freq: Two times a day (BID) | INTRAVENOUS | Status: DC
  Filled 2015-05-01: qty 1250

## 2015-05-01 MED ORDER — FUROSEMIDE 40 MG PO TABS: 40.0000 mg | ORAL_TABLET | Freq: Every day | ORAL | Status: DC

## 2015-05-01 MED ORDER — SODIUM CHLORIDE 0.9 % IV SOLN: 250.0000 mL | INTRAVENOUS | Status: DC | PRN

## 2015-05-01 MED ORDER — SPIRONOLACTONE 25 MG PO TABS: 100.0000 mg | ORAL_TABLET | Freq: Every day | ORAL | Status: DC

## 2015-05-01 MED ORDER — ASPIRIN EC 81 MG PO TBEC
81.0000 mg | DELAYED_RELEASE_TABLET | Freq: Every day | ORAL | Status: DC
  Administered 2015-05-01 – 2015-05-11 (×11): 81 mg via ORAL
  Filled 2015-05-01 (×14): qty 1

## 2015-05-01 MED ORDER — DEXTROSE 5 % IV SOLN: 1.0000 g | INTRAVENOUS | Status: DC

## 2015-05-01 MED ORDER — ONDANSETRON HCL 4 MG/2ML IJ SOLN
4.0000 mg | Freq: Once | INTRAMUSCULAR | Status: AC | PRN
  Administered 2015-05-01: 4 mg via INTRAVENOUS
  Filled 2015-05-01: qty 2

## 2015-05-01 MED ORDER — HEPARIN SODIUM (PORCINE) 5000 UNIT/ML IJ SOLN
5000.0000 [IU] | Freq: Three times a day (TID) | INTRAMUSCULAR | Status: DC
Start: 2015-05-01 — End: 2015-05-11
  Administered 2015-05-01 – 2015-05-11 (×29): 5000 [IU] via SUBCUTANEOUS
  Filled 2015-05-01 (×38): qty 1

## 2015-05-01 MED ORDER — ACETAMINOPHEN 325 MG PO TABS
650.0000 mg | ORAL_TABLET | Freq: Once | ORAL | Status: AC
  Administered 2015-05-01: 650 mg via ORAL
  Filled 2015-05-01: qty 2

## 2015-05-01 MED ORDER — VITAMIN B-1 100 MG PO TABS
100.0000 mg | ORAL_TABLET | Freq: Every day | ORAL | Status: DC
  Administered 2015-05-01 – 2015-05-11 (×11): 100 mg via ORAL
  Filled 2015-05-01 (×12): qty 1

## 2015-05-01 MED ORDER — ONDANSETRON HCL 4 MG/2ML IJ SOLN
4.0000 mg | Freq: Four times a day (QID) | INTRAMUSCULAR | Status: DC | PRN
  Administered 2015-05-05 – 2015-05-08 (×4): 4 mg via INTRAVENOUS
  Filled 2015-05-01 (×4): qty 2

## 2015-05-01 MED ORDER — SODIUM CHLORIDE 0.9 % IJ SOLN
3.0000 mL | Freq: Two times a day (BID) | INTRAMUSCULAR | Status: DC
  Administered 2015-05-01 – 2015-05-08 (×13): 3 mL via INTRAVENOUS

## 2015-05-01 NOTE — ED Notes (Signed)
Oxygen level is 94% on ra. Pt reports always having some SOB

## 2015-05-01 NOTE — ED Notes (Signed)
Pt here today for fever and chills that began about lunchtime today.  Pt states he has nausea but no vomiting.   Has history of UTI's and he states his urine has been dark and is wondering if he could have another UTI. He is from [facility] which is an assisted living facility.

## 2015-05-01 NOTE — Progress Notes (Signed)
CRITICAL VALUE ALERT  Critical value received:  Lactic acid 4.2  Date of notification:  05/01/15  Time of notification:  2328  Critical value read back: yes  Nurse who received alert:  Val Riles, RN  MD notified (1st page):  On call  Time of first page:  2328  MD notified (2nd page): 0  Time of second page: 0  Responding MD:  Waiting on page. Handed off to RN taking over  Time MD responded:  0

## 2015-05-01 NOTE — ED Notes (Signed)
Pt not agreeable to gown

## 2015-05-01 NOTE — ED Provider Notes (Signed)
CSN: [id number]     Arrival date & time 05/01/15  1350 History   First MD Initiated Contact with Patient 05/01/15 1455     Chief Complaint  Patient presents with  . Chills     (Consider location/radiation/quality/duration/timing/severity/associated sxs/prior Treatment) HPI Comments: At homeless shelter, transitional house   Patient is a 59 y.o. male presenting with general illness.  Illness Location:  Chills Quality:  Chills, nausea Severity:  Moderate Onset quality:  Gradual Duration:  4 hours Timing:  Constant Progression:  Worsening Chronicity:  New Context:  Living at shelter for 3 weeks, no cough, no fever, no chest pain, no abdominal pain, no SOB (no more than usual), taking medicines as prescribed Relieved by:  Blanket helps Associated symptoms: fatigue (just got sleep apnea machine back), headaches (starting slowly in ED), nausea and shortness of breath (chronic "from being overweight")   Associated symptoms: no abdominal pain, no chest pain, no congestion, no cough, no diarrhea, no fever, no loss of consciousness, no rash, no rhinorrhea, no sore throat and no vomiting   Shortness of breath:    Severity:  Mild Risk factors:  Afib, chf   Past Medical History  Diagnosis Date  . Atrial fibrillation, chronic   . Hypertension   . GERD (gastroesophageal reflux disease)   . Septic shock   . CHF (congestive heart failure)   . Shortness of breath dyspnea    Past Surgical History  Procedure Laterality Date  . Splenectomy    . Tonsilectomy, adenoidectomy, bilateral myringotomy and tubes    . Knee arthroscopy    . Hernia repair     History reviewed. No pertinent family history. History  Substance Use Topics  . Smoking status: Former Smoker    Types: Cigarettes    Quit date: 11/01/1974  . Smokeless tobacco: Never Used     Comment: smoked in high school for about a year  . Alcohol Use: Yes    Review of Systems  Constitutional: Positive for fatigue (just got sleep  apnea machine back). Negative for fever.  HENT: Negative for congestion, rhinorrhea and sore throat.   Eyes: Negative for visual disturbance.  Respiratory: Positive for shortness of breath (chronic "from being overweight"). Negative for cough.   Cardiovascular: Negative for chest pain.  Gastrointestinal: Positive for nausea. Negative for vomiting, abdominal pain and diarrhea.  Genitourinary: Negative for difficulty urinating.  Musculoskeletal: Negative for back pain and neck stiffness.  Skin: Negative for rash.  Neurological: Positive for headaches (starting slowly in ED). Negative for loss of consciousness and syncope.      Allergies  Potassium-containing compounds; Augmentin; and Neomycin  Home Medications   Prior to Admission medications   Medication Sig Start Date End Date Taking? Authorizing Provider  aspirin 81 MG EC tablet Take 1 tablet (81 mg total) by mouth daily. 04/03/15  Yes [place] N Rumley, DO  diltiazem (CARDIZEM CD) 180 MG 24 hr capsule TAKE 1 CAPSULE (180 MG TOTAL) BY MOUTH DAILY. 04/27/15  Yes [place] N Rumley, DO  folic acid (FOLVITE) 1 MG tablet Take 1 tablet (1 mg total) by mouth daily. 02/05/15  Yes Coral Spikes, DO  furosemide (LASIX) 40 MG tablet Take 1 tablet (40 mg total) by mouth daily. 04/27/15  Yes [place] N Rumley, DO  ketoconazole (NIZORAL) 2 % cream Apply 1 application topically daily as needed for irritation.   Yes Historical Provider, MD  Multiple Vitamins-Minerals (CENTRUM SILVER ADULT 50+) TABS Take 125 mg by mouth daily.   Yes Historical  Provider, MD  spironolactone (ALDACTONE) 50 MG tablet Take 2 tablets (100 mg total) by mouth daily. 04/03/15  Yes [place] N Rumley, DO  thiamine 100 MG tablet Take 1 tablet (100 mg total) by mouth daily. 07/20/14  Yes Elberta Leatherwood, MD   BP 87/51 mmHg  Pulse 90  Temp(Src) 98.2 F (36.8 C) (Rectal)  Resp 20  Ht 5\' 10"  (1.778 m)  Wt 364 lb 6.7 oz (165.3 kg)  BMI 52.29 kg/m2  SpO2 95% Physical Exam  Constitutional: He  is oriented to person, place, and time. He appears well-developed and well-nourished. He appears ill. No distress.  HENT:  Head: Normocephalic and atraumatic.  Eyes: Conjunctivae and EOM are normal.  Neck: Normal range of motion.  Cardiovascular: Normal heart sounds and intact distal pulses.  An irregularly irregular rhythm present. Tachycardia present.  Exam reveals no gallop and no friction rub.   No murmur heard. Pulmonary/Chest: Effort normal and breath sounds normal. No respiratory distress. He has no wheezes. He has no rales.  Abdominal: Soft. He exhibits no distension. There is no tenderness. There is no guarding.  Periumbilical hernia, soft, nontender  Musculoskeletal: He exhibits no edema.  Neurological: He is alert and oriented to person, place, and time.  Skin: Skin is warm and dry. Rash (erythema right upper thigh, venous stasis changes bilateral lower legs with more erythema to right lower leg) noted. He is not diaphoretic.  Nursing note and vitals reviewed.   ED Course  Procedures (including critical care time) Labs Review Labs Reviewed  URINALYSIS, ROUTINE W REFLEX MICROSCOPIC (NOT AT [facility]) - Abnormal; Notable for the following:    Color, Urine AMBER (*)    APPearance CLOUDY (*)    Hgb urine dipstick LARGE (*)    Protein, ur 30 (*)    Leukocytes, UA MODERATE (*)    All other components within normal limits  COMPREHENSIVE METABOLIC PANEL - Abnormal; Notable for the following:    Sodium 134 (*)    BUN 22 (*)    Calcium 8.7 (*)    Albumin 2.9 (*)    AST 72 (*)    Alkaline Phosphatase 134 (*)    All other components within normal limits  CBC - Abnormal; Notable for the following:    WBC 29.5 (*)    Hemoglobin 17.2 (*)    MCH 34.3 (*)    All other components within normal limits  URINE MICROSCOPIC-ADD ON - Abnormal; Notable for the following:    Bacteria, UA FEW (*)    All other components within normal limits  CBC - Abnormal; Notable for the following:    WBC  43.5 (*)    MCH 34.1 (*)    All other components within normal limits  BASIC METABOLIC PANEL - Abnormal; Notable for the following:    Sodium 133 (*)    Glucose, Bld 113 (*)    BUN 27 (*)    Creatinine, Ser 2.19 (*)    Calcium 8.4 (*)    GFR calc non Af Amer 32 (*)    GFR calc Af Amer 37 (*)    All other components within normal limits  LACTIC ACID, PLASMA - Abnormal; Notable for the following:    Lactic Acid, Venous 4.2 (*)    All other components within normal limits  LACTIC ACID, PLASMA - Abnormal; Notable for the following:    Lactic Acid, Venous 3.4 (*)    All other components within normal limits  GLUCOSE, CAPILLARY - Abnormal; Notable  for the following:    Glucose-Capillary 104 (*)    All other components within normal limits  I-STAT CG4 LACTIC ACID, ED - Abnormal; Notable for the following:    Lactic Acid, Venous 2.43 (*)    All other components within normal limits  I-STAT CG4 LACTIC ACID, ED - Abnormal; Notable for the following:    Lactic Acid, Venous 2.96 (*)    All other components within normal limits  CULTURE, BLOOD (ROUTINE X 2)  URINE CULTURE  CULTURE, BLOOD (ROUTINE X 2)  I-STAT TROPOININ, ED    Imaging Review Dg Chest 2 View  05/01/2015   CLINICAL DATA:  New tachypneic, leukocytosis.  Fever and chills.  EXAM: CHEST  2 VIEW  COMPARISON:  03/10/2015  FINDINGS: Small right pleural effusion is unchanged. There is right lower lobe atelectasis/ infiltrate also unchanged.  Left lung remains clear  Heart size upper normal.  Mild vascular congestion without edema.  IMPRESSION: Right pleural effusion and right lower lobe atelectasis/ infiltrate unchanged.   Electronically Signed   By: Franchot Gallo M.D.   On: 05/01/2015 15:43     EKG Interpretation   Date/Time:  Tuesday May 01 2015 15:02:38 EDT Ventricular Rate:  119 PR Interval:    QRS Duration: 77 QT Interval:  315 QTC Calculation: 443 R Axis:   -65 Text Interpretation:  Atrial fibrillation Left anterior  fascicular block  Anterior infarct, old Rate has increased since prior EKG, however EKG is  similar to prior. Confirmed by [facility] MD, [place] ([id number]) on 05/02/2015  2:40:40 AM      MDM   Final diagnoses:  Sepsis, due to unspecified organism  UTI (lower urinary tract infection)  Cellulitis of right leg  Paroxysmal atrial fibrillation   59 year old male with a history of atrial fibrillation, hypertension, alcoholism, chronic pleural effusion, diabetes, homelessness presents with concern for chills.  On arrival to the emergency department, patient was initially afebrile, and in atrial fibrillation with rate in the 110s, normal blood pressures and normal oxygen saturation.   CBC was drawn which showed a white count 29,000, rectal temperature was done which showed temperature of 102.9, and patient had a lactate of 2.43. X-ray was done which showed an unchanged right-sided pleural effusion, and patient denies any increased shortness of breath or cough. Urinalysis was concerning for possible urinary tract infection with patient reporting some dysuria as well. He should also noted to have a right leg cellulitis including area of erythema of the lower leg as well as an area of erythema of the right upper thigh. There is no extension into the groin. He was given vancomycin for concern of cellulitis and Rocephin for concern of UTI. He was given NS and will be admitted to telemetry for further care with family medicine.      Gareth Morgan, MD 05/02/15 [phone]

## 2015-05-01 NOTE — Progress Notes (Signed)
[facility] from [facility] into room 5W08. MD notified of patient arrival.

## 2015-05-01 NOTE — H&P (Signed)
[facility] Admission History and Physical Service Pager: [phone]  Patient name: [name] [name] Medical record number: [id number] Date of birth: June 25, 1956 Age: 59 y.o. Gender: male  Primary Care Provider: Junie Panning, DO Consultants: none Code Status: Full  Chief Complaint: chills  Assessment and Plan: Matthew [name] is a 59 y.o. male presenting with sepsis secondary to UTI and cellulitis . PMH is significant for afib, HTN, E5ID, alcoholic cirrhosis, chronic right pleural effusion, OSA  # Sepsis / RLE cellulitis / possible UTI: Septic criteria for WBC 29.5, tachycardia, fever. qSOFA score 0. Vitals appear to be stable other than tachycardia, fever; both have resolved on transfer over to [facility]. UA showing moderate leuks, on microscopy few bacteria, rare yeast, WBC 11-20; he does have some complaints of burning with urination. RLE is edematous, red, warm compared to the left. He has a history of LE cellulitis. i-stat lactic acid 2.43, repeat 2.96.  - blood cultures pending - urine culture pending - continue vancomycin - continue ceftriaxone - repeat lactic acid x 3 - repeat CBC, BMP in AM  - saline lock IVF for now, bolus as needed  # Chronic right pleural effusion: known since around [facility] 2015. He has undergone multiple taps (4) showing transudative process. O2 sat 95-98% on room air. - monitor clinically  # Afib: previously on eliquis but was decided to discontinue in May 2016 and continue on daily aspirin due to [facility] score = 1.  - continue home diltiazem XR 180mg   # OSA - continue CPAP  # Cirrhosis: likely alcoholic cirrhosis, underwent workup in 2015 with CT, negative HCV; had attempted MRI but pt was unable to fit in MRI machine. - trend cmet as needed  # Alcohol abuse: states last drink was 2 months ago (which was 2 day binge, prior to that 1-2 months before) - will hold off on CIWA protocol at this time  FEN/GI: diet carb  modified Prophylaxis: heparin sq  Disposition: admit  History of Present Illness: Matthew [name] is a 59 y.o. male presenting with 1 day history of chills. He states prior to waking up this morning he felt he was doing quite well. Woke up this morning with chills (no fever), and was told by homeless shelter he needed to go to the ED. He currently feels better, but is not sure why he keeps having set backs. He does endorse some burning with urination but isn't sure how long it has been there. He does not feel any pain or signs of infection around his abdomen. He has not noticed any swelling in his legs until he was given IVF in the ED, denies any pains in the legs. He says he has been doing better since he last saw me, going to a lot of programs to help him with his alcoholism and says his last drink was 2 months ago; he also picked up walking at the [facility].  Review Of Systems: Per HPI with the following additions: no CP, no SOB, no cough Otherwise 12 point review of systems was performed and was unremarkable.  Patient Active Problem List   Diagnosis Date Noted  . Sepsis 05/01/2015  . Depression 03/21/2015  . Pleural effusion on right   . SOB (shortness of breath)   . Alcoholic cirrhosis of liver with ascites   . Alcoholism   . S/P thoracentesis   . Shortness of breath 02/15/2015  . Hypoxia   . Pleural effusion   . Essential hypertension   .  Dyspnea 01/29/2015  . Drug ingestion 07/18/2014  . Adjustment disorder with disturbance of emotion 07/14/2014  . Pannus, abdominal 02/23/2014  . 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 Poor social situation 11/11/2013  . Chronic alcohol abuse 11/03/2013  . A-fib 03/25/2012  . DM type 2 (diabetes mellitus, type 2) 03/25/2012  . Obesity 03/25/2012  . Sleep apnea 03/25/2012  . Fatty liver 03/25/2012   Past Medical History: Past Medical History  Diagnosis Date  . Atrial fibrillation, chronic   . Hypertension   . GERD (gastroesophageal  reflux disease)   . Septic shock   . CHF (congestive heart failure)   . Shortness of breath dyspnea    Past Surgical History: Past Surgical History  Procedure Laterality Date  . Splenectomy    . Tonsilectomy, adenoidectomy, bilateral myringotomy and tubes    . Knee arthroscopy    . Hernia repair     Social History: History  Substance Use Topics  . Smoking status: Former Smoker    Types: Cigarettes    Quit date: 11/01/1974  . Smokeless tobacco: Never Used     Comment: smoked in high school for about a year  . Alcohol Use: Yes   Additional social history: living at homeless shelter Please also refer to relevant sections of EMR.  Family History: History reviewed. No pertinent family history. Allergies and Medications: Allergies  Allergen Reactions  . Potassium-Containing Compounds Other (See Comments)    Stomach Pain  . Augmentin [Amoxicillin-Pot Clavulanate] Nausea Only    Most of the time it is okay, but has had some bad experiences with it.  [name] Neomycin Rash   No current facility-administered medications on file prior to encounter.   Current Outpatient Prescriptions on File Prior to Encounter  Medication Sig Dispense Refill  . aspirin 81 MG EC tablet Take 1 tablet (81 mg total) by mouth daily. 30 tablet 0  . diltiazem (CARDIZEM CD) 180 MG 24 hr capsule TAKE 1 CAPSULE (180 MG TOTAL) BY MOUTH DAILY. 30 capsule 0  . folic acid (FOLVITE) 1 MG tablet Take 1 tablet (1 mg total) by mouth daily.    . furosemide (LASIX) 40 MG tablet Take 1 tablet (40 mg total) by mouth daily. 30 tablet 0  . spironolactone (ALDACTONE) 50 MG tablet Take 2 tablets (100 mg total) by mouth daily. 60 tablet 0  . thiamine 100 MG tablet Take 1 tablet (100 mg total) by mouth daily. 30 tablet 0    Objective: BP 97/55 mmHg  Pulse 92  Temp(Src) 98.5 F (36.9 C) (Rectal)  Resp 22  Ht 5\' 10"  (1.778 m)  Wt 364 lb 6.7 oz (165.3 kg)  BMI 52.29 kg/m2  SpO2 95% Exam: General: NAD, sitting upright in  chair Eyes: PERRL, EOMI, sclera anicteric ENTM: no oropharyngeal lesions, MMM. Neck: supple, no thyromegaly appreciated with large neck girth Cardiovascular: irregular rhythm, not tachycardic, no murmur appreciated. 2+ radial pulses bilaterally. Respiratory: diminished breath sounds at the bases, normal effort Abdomen: obese, large panus, no tenderness, normal bowel sounds MSK: normal bulk/tone, no deformities Skin: significant venous stasis changes on both LE, right > left. Right lower leg is more red and is warm to touch compared to the left, mildly tender. Both LE are 2+ pitting edema Neuro: alert and oriented, no focal deficits Psych: mood normal. Speech normal. Thought content normal.  Labs and Imaging: CBC BMET   Recent Labs Lab 05/01/15 1440  WBC 29.5*  HGB 17.2*  HCT 47.9  PLT 385    Recent  Labs Lab 05/01/15 1440  NA 134*  K 4.2  CL 103  CO2 22  BUN 22*  CREATININE 1.10  GLUCOSE 93  CALCIUM 8.7*     Dg Chest 2 View  05/01/2015   CLINICAL DATA:  New tachypneic, leukocytosis.  Fever and chills.  EXAM: CHEST  2 VIEW  COMPARISON:  03/10/2015  FINDINGS: Small right pleural effusion is unchanged. There is right lower lobe atelectasis/ infiltrate also unchanged.  Left lung remains clear  Heart size upper normal.  Mild vascular congestion without edema.  IMPRESSION: Right pleural effusion and right lower lobe atelectasis/ infiltrate unchanged.   Electronically Signed   By: Franchot Gallo M.D.   On: 05/01/2015 15:43     Leone Brand, MD 05/01/2015, 5:18 PM PGY-3, [place] Intern pager: [phone], text pages welcome

## 2015-05-01 NOTE — Progress Notes (Signed)
ANTIBIOTIC CONSULT NOTE - INITIAL  Pharmacy Consult for Vancomycin Indication: Cellulitis  Allergies  Allergen Reactions  . Potassium-Containing Compounds Other (See Comments)    Stomach Pain  . Augmentin [Amoxicillin-Pot Clavulanate] Nausea Only    Most of the time it is okay, but has had some bad experiences with it.  [name] Neomycin Rash    Patient Measurements:   As of 03/21/15: Height 70 inches Weight 153 kg  Vital Signs: Temp: 98.6 F (37 C) (08/02 1400) Temp Source: Oral (08/02 1400) BP: 157/72 mmHg (08/02 1400) Pulse Rate: 114 (08/02 1400) Intake/Output from previous day:   Intake/Output from this shift:    Labs:  Recent Labs  05/01/15 1440  WBC 29.5*  HGB 17.2*  PLT 385  CREATININE 1.10   CrCl cannot be calculated (Unknown ideal weight.). No results for input(s): VANCOTROUGH, VANCOPEAK, VANCORANDOM, GENTTROUGH, GENTPEAK, GENTRANDOM, TOBRATROUGH, TOBRAPEAK, TOBRARND, AMIKACINPEAK, AMIKACINTROU, AMIKACIN in the last 72 hours.   Microbiology: No results found for this or any previous visit (from the past 720 hour(s)).  Medical History: Past Medical History  Diagnosis Date  . Atrial fibrillation, chronic   . Hypertension   . GERD (gastroesophageal reflux disease)   . Septic shock   . CHF (congestive heart failure)   . Shortness of breath dyspnea     Medications:  Scheduled:   Infusions:  . cefTRIAXone (ROCEPHIN)  IV    . vancomycin     PRN:   Assessment: 59 yo male who has been living at a shelter for 3 weeks presents with fever and chills.  Pharmacy is consulted to dose vancomycin for cellulitis of the thigh.  8/2 >> Rocephin x 1 8/2 >> Vancomycin >>  8/2 blood x 2: sent  Tmax: Afeb WBC: 29.5 Renal: SCr 1.1, CrCl ~75 ml/min/1.59m2 (normalized) Lactate: 2.43  Goal of Therapy:  Vancomycin trough level 10-15 mcg/ml  Plan:   Vancomycin 2.5g IV x 1 now, then 1250mg  IV q12h Check trough at steady state Follow up renal function &  cultures, clinical course  Peggyann Juba, PharmD, BCPS Pager: [phone] 05/01/2015,3:41 PM

## 2015-05-01 NOTE — Telephone Encounter (Signed)
CSW LVM for pt on 7/29 to inform him that prescriptions were available for pickup at the pharmacy.

## 2015-05-02 ENCOUNTER — Ambulatory Visit: Payer: Self-pay | Admitting: Family Medicine

## 2015-05-02 DIAGNOSIS — L03115 Cellulitis of right lower limb: Secondary | ICD-10-CM | POA: Insufficient documentation

## 2015-05-02 DIAGNOSIS — J948 Other specified pleural conditions: Secondary | ICD-10-CM

## 2015-05-02 LAB — BASIC METABOLIC PANEL
ANION GAP: 10 (ref 5–15)
ANION GAP: 9 (ref 5–15)
BUN: 35 mg/dL — ABNORMAL HIGH (ref 6–20)
BUN: 27 mg/dL — AB (ref 6–20)
CALCIUM: 8.4 mg/dL — AB (ref 8.9–10.3)
CO2: 22 mmol/L (ref 22–32)
CO2: 21 mmol/L — ABNORMAL LOW (ref 22–32)
CREATININE: 2.16 mg/dL — AB (ref 0.61–1.24)
Calcium: 8.2 mg/dL — ABNORMAL LOW (ref 8.9–10.3)
Chloride: 102 mmol/L (ref 101–111)
Chloride: 102 mmol/L (ref 101–111)
Creatinine, Ser: 2.19 mg/dL — ABNORMAL HIGH (ref 0.61–1.24)
GFR calc Af Amer: 37 mL/min — ABNORMAL LOW (ref >60)
GFR calc Af Amer: 37 mL/min — ABNORMAL LOW (ref >60)
GFR calc non Af Amer: 32 mL/min — ABNORMAL LOW (ref >60)
GFR, EST NON AFRICAN AMERICAN: 32 mL/min — AB (ref >60)
Glucose, Bld: 113 mg/dL — ABNORMAL HIGH (ref 65–99)
Glucose, Bld: 89 mg/dL (ref 65–99)
Potassium: 4.8 mmol/L (ref 3.5–5.1)
Potassium: 5.4 mmol/L — ABNORMAL HIGH (ref 3.5–5.1)
SODIUM: 133 mmol/L — AB (ref 135–145)
Sodium: 133 mmol/L — ABNORMAL LOW (ref 135–145)

## 2015-05-02 LAB — CBC WITH DIFFERENTIAL/PLATELET
BASOS PCT: 0 % (ref 0–1)
Basophils Absolute: 0 10*3/uL (ref 0.0–0.1)
EOS PCT: 0 % (ref 0–5)
Eosinophils Absolute: 0 10*3/uL (ref 0.0–0.7)
HEMATOCRIT: 45.2 % (ref 39.0–52.0)
Hemoglobin: 16.7 g/dL (ref 13.0–17.0)
LYMPHS ABS: 0.5 10*3/uL — AB (ref 0.7–4.0)
Lymphocytes Relative: 1 % — ABNORMAL LOW (ref 12–46)
MCH: 34.5 pg — AB (ref 26.0–34.0)
MCHC: 36.9 g/dL — ABNORMAL HIGH (ref 30.0–36.0)
MCV: 93.4 fL (ref 78.0–100.0)
MONOS PCT: 2 % — AB (ref 3–12)
Monocytes Absolute: 0.9 10*3/uL (ref 0.1–1.0)
Neutro Abs: 45.3 10*3/uL — ABNORMAL HIGH (ref 1.7–7.7)
Neutrophils Relative %: 97 % — ABNORMAL HIGH (ref 43–77)
Platelets: 267 10*3/uL (ref 150–400)
RBC: 4.84 MIL/uL (ref 4.22–5.81)
RDW: 15.6 % — ABNORMAL HIGH (ref 11.5–15.5)
WBC MORPHOLOGY: INCREASED
WBC: 46.7 10*3/uL — AB (ref 4.0–10.5)

## 2015-05-02 LAB — URINALYSIS W MICROSCOPIC (NOT AT ARMC)
GLUCOSE, UA: NEGATIVE mg/dL
Ketones, ur: 15 mg/dL — AB
Nitrite: NEGATIVE
PH: 5.5 (ref 5.0–8.0)
Protein, ur: 100 mg/dL — AB
SPECIFIC GRAVITY, URINE: 1.015 (ref 1.005–1.030)
UROBILINOGEN UA: 1 mg/dL (ref 0.0–1.0)

## 2015-05-02 LAB — MRSA PCR SCREENING: MRSA BY PCR: NEGATIVE

## 2015-05-02 LAB — CBC
HCT: 43.1 % (ref 39.0–52.0)
HEMOGLOBIN: 15.5 g/dL (ref 13.0–17.0)
MCH: 34.1 pg — ABNORMAL HIGH (ref 26.0–34.0)
MCHC: 36 g/dL (ref 30.0–36.0)
MCV: 94.9 fL (ref 78.0–100.0)
Platelets: 306 10*3/uL (ref 150–400)
RBC: 4.54 MIL/uL (ref 4.22–5.81)
RDW: 15.5 % (ref 11.5–15.5)
WBC: 43.5 10*3/uL — AB (ref 4.0–10.5)

## 2015-05-02 LAB — LACTIC ACID, PLASMA
LACTIC ACID, VENOUS: 3.4 mmol/L — AB (ref 0.5–2.0)
Lactic Acid, Venous: 2.7 mmol/L (ref 0.5–2.0)

## 2015-05-02 MED ORDER — SODIUM CHLORIDE 0.9 % IV SOLN
INTRAVENOUS | Status: DC
  Administered 2015-05-02: 08:00:00 via INTRAVENOUS

## 2015-05-02 MED ORDER — ACETAMINOPHEN 325 MG PO TABS
650.0000 mg | ORAL_TABLET | Freq: Four times a day (QID) | ORAL | Status: DC | PRN
  Administered 2015-05-02 – 2015-05-06 (×3): 650 mg via ORAL
  Filled 2015-05-02 (×3): qty 2

## 2015-05-02 MED ORDER — SODIUM CHLORIDE 0.9 % IV SOLN
1.0000 g | Freq: Three times a day (TID) | INTRAVENOUS | Status: DC
  Administered 2015-05-02 – 2015-05-06 (×13): 1 g via INTRAVENOUS
  Filled 2015-05-02 (×15): qty 1

## 2015-05-02 MED ORDER — ALUM & MAG HYDROXIDE-SIMETH 200-200-20 MG/5ML PO SUSP
15.0000 mL | ORAL | Status: DC | PRN
  Administered 2015-05-02 (×2): 15 mL via ORAL
  Filled 2015-05-02 (×2): qty 30

## 2015-05-02 MED ORDER — VANCOMYCIN HCL 10 G IV SOLR
1750.0000 mg | INTRAVENOUS | Status: DC
  Administered 2015-05-02 – 2015-05-05 (×4): 1750 mg via INTRAVENOUS
  Filled 2015-05-02 (×6): qty 1750

## 2015-05-02 MED ORDER — DILTIAZEM HCL 30 MG PO TABS
30.0000 mg | ORAL_TABLET | Freq: Four times a day (QID) | ORAL | Status: DC
  Administered 2015-05-02 – 2015-05-03 (×4): 30 mg via ORAL
  Filled 2015-05-02 (×8): qty 1

## 2015-05-02 NOTE — Progress Notes (Signed)
Family Medicine Teaching Service Daily Progress Note Intern Pager: [phone]  Patient name: Matthew [name] Medical record number: [id number] Date of birth: 1956-04-07 Age: 59 y.o. Gender: male  Primary Care Provider: Junie Panning, DO Consultants: None Code Status: Full   Pt Overview and Major Events to Date:    Assessment and Plan: Matthew [name] is a 59 y.o. male presenting with sepsis secondary to UTI and cellulitis . PMH is significant for afib, HTN, E3MO, alcoholic cirrhosis, chronic right pleural effusion, OSA  # Sepsis / RLE cellulitis / possible UTI: Septic criteria for WBC 29.5, tachycardia, fever. qSOFA score 0. Vitals appear to be stable other than tachycardia, fever; both have resolved on transfer over to [facility]. UA showing moderate leuks, on microscopy few bacteria, rare yeast, WBC 11-20; he does have some complaints of burning with urination. RLE is edematous, red, warm compared to the left. He has a history of LE cellulitis. i-stat lactic acid 2.43, repeat 2.96.  - blood cultures and urine culture pending - continue vancomycin and meropenum  - repeat lactic acid  4.2 --> 3.4, repeat latic acid this AM  - WBC 29.5 --> 43.5   - NS @ 75/ml on this AM     # AKI - On admission SCr 1.1 --> 2.19. Received NS bolus.  - Sr Cr elevated this AM significantly - NS @ 75/ml on this AM    - Will continue to watch CMP  # Chronic right pleural effusion: known since around [facility] 2015. He has undergone multiple taps (4) showing transudative process. O2 sat 95-98% on room air. - monitor clinically  # Afib: previously on eliquis but was decided to discontinue in May 2016 and continue on daily aspirin due to [facility] score = 1.  - continue home diltiazem XR 180mg   # OSA - continue CPAP  # Cirrhosis: likely alcoholic cirrhosis, underwent workup in 2015 with CT, negative HCV; had attempted MRI but pt was unable to fit in MRI machine. ALT 72  - trend CMP as needed  # Alcohol  abuse: states last drink was 2 months ago (which was 2 day binge, prior to that 1-2 months before) - will hold off on CIWA protocol at this time  FEN/GI: diet carb modified Prophylaxis: heparin sq  Disposition: FMT, dispo home   Subjective:  Patient doing well this AM. Indicates chills before hospitalization. However not currently endorsing any fever or chills. No n/v, pain,   Objective: Temp:  [98.2 F (36.8 C)-102.8 F (39.3 C)] 98.4 F (36.9 C) (08/03 0526) Pulse Rate:  [72-122] 81 (08/03 0526) Resp:  [18-22] 22 (08/03 0526) BP: (78-157)/(39-92) 101/56 mmHg (08/03 0526) SpO2:  [95 %-98 %] 97 % (08/03 0526) Weight:  [364 lb 6.7 oz (165.3 kg)] 364 lb 6.7 oz (165.3 kg) (08/02 2040) Physical Exam: General: obese, sitting up in chair eating breakfast, NAD  Cardiovascular: RRR, no m/r/g, radial pulses  Respiratory: diminished breath sound, CTAB  Abdomen: large pannus,NTND, BS+ Extremities: Cellulitis on right upper and lower extremity, erthymatous, tender and warm to touch. Marked cellulitis. Both Right and Left leg with significant venous stasis.  2+ pitting edema    Laboratory:  Recent Labs Lab 05/01/15 1440 05/02/15 0046  WBC 29.5* 43.5*  HGB 17.2* 15.5  HCT 47.9 43.1  PLT 385 306    Recent Labs Lab 05/01/15 1440 05/02/15 0046  NA 134* 133*  K 4.2 4.8  CL 103 102  CO2 22 22  BUN 22* 27*  CREATININE 1.10  2.19*  CALCIUM 8.7* 8.4*  PROT 7.8  --   BILITOT 1.0  --   ALKPHOS 134*  --   ALT 45  --   AST 72*  --   GLUCOSE 93 113*   Results for Matthew [name], Matthew [name] (MRN [id number]) as of 05/02/2015 09:23  Ref. Range 05/01/2015 15:33 05/01/2015 18:48 05/01/2015 22:15 05/02/2015 00:46  Lactic Acid, Venous Latest Ref Range: 0.5-2.0 mmol/L 2.43 (HH) 2.96 (HH) 4.2 (HH) 3.4 (HH)   Urinalysis    Component Value Date/Time   COLORURINE AMBER* 05/01/2015 1423   APPEARANCEUR CLOUDY* 05/01/2015 1423   LABSPEC 1.013 05/01/2015 1423   PHURINE 6.0 05/01/2015 1423   GLUCOSEU NEGATIVE  05/01/2015 1423   HGBUR LARGE* 05/01/2015 1423   BILIRUBINUR NEGATIVE 05/01/2015 1423   KETONESUR NEGATIVE 05/01/2015 1423   PROTEINUR 30* 05/01/2015 1423   UROBILINOGEN 0.2 05/01/2015 1423   NITRITE NEGATIVE 05/01/2015 1423   LEUKOCYTESUR MODERATE* 05/01/2015 1423     Microbiology  Blood Cultures  Urine Cultures   Imaging/Diagnostic Tests:  CXR IMPRESSION: Right pleural effusion and right lower lobe atelectasis/ infiltrate unchanged.  Matthew Cletis Media, MD 05/02/2015, 7:35 AM PGY-1, [place] Intern pager: [phone], text pages welcome

## 2015-05-02 NOTE — Progress Notes (Signed)
Utilization Review completed. Teigan Manner RN BSN CM 

## 2015-05-02 NOTE — Progress Notes (Signed)
Report called to Verdie Drown RN at [facility].

## 2015-05-02 NOTE — Progress Notes (Signed)
Pt transferred to [facility] via wheelchair. All patient's belongings gathered and taken with patient at this time.

## 2015-05-02 NOTE — Progress Notes (Signed)
CRITICAL VALUE ALERT  Critical value received: Lactic acid 3.4   Date of notification:  05/02/2015  Time of notification: 145  Critical value read back:Yes.    Nurse who received alert:  [place] [employer] officer  MD notified (1st page): Family Medicine Teaching Service   Time of first page:  [phone]  Responding MD: Dr. Lamar Benes   Time MD responded:  [phone]

## 2015-05-02 NOTE — Progress Notes (Signed)
Patient is refusing to wear CPAP at this time. Patient stated he is not feeling well and would wear it tomorrow night. RT informed patient if he changed his mind have RN contact RT and RT would assist him with putting it on.

## 2015-05-02 NOTE — Clinical Documentation Improvement (Signed)
Please identify any clinical conditions associated this admission with the prescribed Lasix 40mg  po daily and spironolactone 100mg  daily ordered and document in your future progress notes and discharge summary. (History grid pulled into H&P notes history of CHF).    Possible Clinical Conditions: -CHF (if present, please specify the acuity and type) -Other condition (please specify) -Unable to determine at present  Thank you, Mateo Flow, RN [phone] Clinical Documentation Specialist

## 2015-05-02 NOTE — Progress Notes (Signed)
Made Dr. Lamar Benes aware of pt Latic acid 4.2 and BP 87/51, HR 90. Gave telephone orders with readback to give normal saline at 70ml/hr. Will continue to monitor pt.

## 2015-05-02 NOTE — Progress Notes (Signed)
Pt BP 78/39, HR 72, Pt asymptomatic; complaining of indigestion, requesting medication for indigestion. Paged oncall MD. Awaiting callback or orders to placed. Will continue to monitor pt.

## 2015-05-02 NOTE — Progress Notes (Signed)
CRITICAL VALUE ALERT  Critical value received:  Lactic acid 2.7  Date of notification:  05/02/15  Time of notification:  0045  Critical value read back:Yes.    Nurse who received alert:  Audria Nine  MD notified (1st page):  Family Medicine Teaching Service   Time of first page:  [phone]  Responding MD:  [place]   Time MD responded:  [phone]

## 2015-05-02 NOTE — Progress Notes (Addendum)
ANTIBIOTIC CONSULT NOTE - INITIAL  Pharmacy Consult for Merrem and Vancocin Indication: rule out sepsis   Labs:  Recent Labs  05/01/15 1440 05/02/15 0046  WBC 29.5* 43.5*  HGB 17.2* 15.5  PLT 385 306  CREATININE 1.10 2.19*   Estimated Creatinine Clearance: 57.8 mL/min (by C-G formula based on Cr of 2.19).   Microbiology: Recent Results (from the past 720 hour(s))  Blood culture (routine x 2)     Status: None (Preliminary result)   Collection Time: 05/01/15  1:50 PM  Result Value Ref Range Status   Specimen Description   Final    BLOOD LEFT HAND Performed at [facility]    Special Requests BOTTLES DRAWN AEROBIC AND ANAEROBIC 5CC  Final   Culture PENDING  Incomplete   Report Status PENDING  Incomplete  Culture, Urine     Status: None (Preliminary result)   Collection Time: 05/01/15  2:23 PM  Result Value Ref Range Status   Specimen Description URINE, RANDOM Performed at [facility]   Final   Special Requests NONE  Final   Culture PENDING  Incomplete   Report Status PENDING  Incomplete      Assessment/Plan:  59yo male to broaden IV ABX started for possible sepsis.  Vanc was started yesterday for cellulitis.  Of note SCr has jumped from 1.1 yesterday to 2.19 this am.  Will change vancomycin to 1750mg  IV Q24H and start start Merrem 1g IV Q8H; monitor CBC, Cx, CrCl.  Wynona Neat, PharmD, BCPS  05/02/2015,7:53 AM

## 2015-05-02 NOTE — Clinical Documentation Improvement (Signed)
Please identify and document in your future progress notes and discharge summary any clinical conditions associated with the patient's weight 364 lb, 6.7 oz, height 5'10" and BMI 52.29 noted in current notes.  Possible Clinical Conditions: -Morbid Obesity -Obesity -Other Condition (please specify) -Unable to determine at present  Thank you, Mateo Flow, RN [phone] Clinical Documentation Specialist

## 2015-05-02 NOTE — Care Management Note (Signed)
Case Management Note  Patient Details  Name: Matthew [name] MRN: [id number] Date of Birth: 01/07/1956  Subjective/Objective:                 Patient from [place]. Has cellulitis and is declining, will be transferred today to higher level of care. CM to place SW consult for homelessness. Patient has PCP recorded as Dr Gerlean Ren and currently provides a [place] address in medical record.    Action/Plan:  Will need to verify resources, and placement after discharge.   Expected Discharge Date:   (unknown)               Expected Discharge Plan:  Homeless Shelter  In-House Referral:  Clinical Social Work  Discharge planning Services  CM Consult  Post Acute Care Choice:    Choice offered to:     DME Arranged:    DME Agency:     HH Arranged:    [place] Agency:     Status of Service:  In process, will continue to follow  Medicare Important Message Given:    Date Medicare IM Given:    Medicare IM give by:    Date Additional Medicare IM Given:    Additional Medicare Important Message give by:     If discussed at [place] of Stay Meetings, dates discussed:    Additional Comments:  Carles Collet, RN 05/02/2015, 11:49 AM

## 2015-05-03 ENCOUNTER — Inpatient Hospital Stay (HOSPITAL_COMMUNITY): Payer: Medicaid Other

## 2015-05-03 DIAGNOSIS — L97511 Non-pressure chronic ulcer of other part of right foot limited to breakdown of skin: Secondary | ICD-10-CM

## 2015-05-03 DIAGNOSIS — N179 Acute kidney failure, unspecified: Secondary | ICD-10-CM

## 2015-05-03 DIAGNOSIS — IMO0001 Reserved for inherently not codable concepts without codable children: Secondary | ICD-10-CM | POA: Insufficient documentation

## 2015-05-03 DIAGNOSIS — L97509 Non-pressure chronic ulcer of other part of unspecified foot with unspecified severity: Secondary | ICD-10-CM | POA: Insufficient documentation

## 2015-05-03 LAB — COMPREHENSIVE METABOLIC PANEL
ALK PHOS: 108 U/L (ref 38–126)
ALT: 41 U/L (ref 17–63)
AST: 73 U/L — ABNORMAL HIGH (ref 15–41)
Albumin: 1.9 g/dL — ABNORMAL LOW (ref 3.5–5.0)
Anion gap: 10 (ref 5–15)
BUN: 45 mg/dL — ABNORMAL HIGH (ref 6–20)
CO2: 21 mmol/L — AB (ref 22–32)
Calcium: 7.8 mg/dL — ABNORMAL LOW (ref 8.9–10.3)
Chloride: 99 mmol/L — ABNORMAL LOW (ref 101–111)
Creatinine, Ser: 2.17 mg/dL — ABNORMAL HIGH (ref 0.61–1.24)
GFR calc non Af Amer: 32 mL/min — ABNORMAL LOW (ref >60)
GFR, EST AFRICAN AMERICAN: 37 mL/min — AB (ref >60)
GLUCOSE: 57 mg/dL — AB (ref 65–99)
Potassium: 5 mmol/L (ref 3.5–5.1)
SODIUM: 130 mmol/L — AB (ref 135–145)
Total Bilirubin: 1 mg/dL (ref 0.3–1.2)
Total Protein: 6.1 g/dL — ABNORMAL LOW (ref 6.5–8.1)

## 2015-05-03 LAB — GLUCOSE, CAPILLARY
Glucose-Capillary: 97 mg/dL (ref 65–99)
Glucose-Capillary: 96 mg/dL (ref 65–99)
Glucose-Capillary: 93 mg/dL (ref 65–99)

## 2015-05-03 LAB — CK: CK TOTAL: 204 U/L (ref 49–397)

## 2015-05-03 LAB — CBC WITH DIFFERENTIAL/PLATELET
Basophils Absolute: 0 10*3/uL (ref 0.0–0.1)
Basophils Relative: 0 % (ref 0–1)
Eosinophils Absolute: 0 10*3/uL (ref 0.0–0.7)
Eosinophils Relative: 0 % (ref 0–5)
HCT: 42.3 % (ref 39.0–52.0)
Hemoglobin: 15.1 g/dL (ref 13.0–17.0)
LYMPHS ABS: 1.3 10*3/uL (ref 0.7–4.0)
LYMPHS PCT: 3 % — AB (ref 12–46)
MCH: 34.2 pg — ABNORMAL HIGH (ref 26.0–34.0)
MCHC: 35.7 g/dL (ref 30.0–36.0)
MCV: 95.7 fL (ref 78.0–100.0)
Monocytes Absolute: 1.8 10*3/uL — ABNORMAL HIGH (ref 0.1–1.0)
Monocytes Relative: 4 % (ref 3–12)
NEUTROS ABS: 40.9 10*3/uL — AB (ref 1.7–7.7)
Neutrophils Relative %: 93 % — ABNORMAL HIGH (ref 43–77)
Platelets: 264 10*3/uL (ref 150–400)
RBC: 4.42 MIL/uL (ref 4.22–5.81)
RDW: 15.9 % — ABNORMAL HIGH (ref 11.5–15.5)
WBC Morphology: INCREASED
WBC: 44 10*3/uL — AB (ref 4.0–10.5)

## 2015-05-03 LAB — C-REACTIVE PROTEIN: CRP: 20.9 mg/dL — ABNORMAL HIGH (ref <1.0)

## 2015-05-03 LAB — SEDIMENTATION RATE: SED RATE: 33 mm/h — AB (ref 0–16)

## 2015-05-03 MED ORDER — SODIUM CHLORIDE 0.9 % IV SOLN: Freq: Once | INTRAVENOUS | Status: DC

## 2015-05-03 NOTE — Progress Notes (Signed)
Family Medicine Teaching Service Daily Progress Note Intern Pager: [phone]  Patient name: Matthew [name] Medical record number: [id number] Date of birth: 08-31-1956 Age: 59 y.o. Gender: male  Primary Care Provider: Junie Panning, DO Consultants: None Code Status: Full   Pt Overview and Major Events to Date:   Assessment and Plan: Matthew [name] is a 59 y.o. male presenting with sepsis secondary to UTI and cellulitis . PMH is significant for afib, HTN, E7OJ, alcoholic cirrhosis, chronic right pleural effusion, OSA  # Sepsis  1. RLE cellulitis : Initial Admission -SIRS tachypenic and leukocytosis overnight. Continues to be hypotensive, however mentation is good.  RLE is edematous, red, warm compared to the left- overall less erythematous today, within lines. Patient does have an ulcer on the right foot, which has been causing him pain this AM. Latic Acid trending down from 4.2 -> 2.7  - blood cultures NGTD  - continue vancomycin and meropenum   - WBC 29.5 >43.5> 44   - Continue NS @ 75/ml on this AM   - Will get ESR/CRP - Consider MRI of foot today-r/o osteomyelitis    2. UTI - On Admission UA showing moderate leuks, on microscopy few bacteria, rare yeast, WBC 11-20; No complaints of burning with urination now.  -   Repeat Urine brown - Many bacteria, Large Hgb, Protein 100, Moderate Leukocytes yesterday.   - Urine Culture- Re-incubated for better growth  - Will get a renal ultrasound this AM  # AKI - On admission SCr 1.1 > 2.19>2.17. NS bolus x 2  -- Prerenal vs. Acute Tubular Necrosis  - Sr Cr stable elevated  - NS @ 75/ml on this AM    - Continue to watch CMP   #Hypotensive  Overnight low of 85/37. Received a bolus of 250 ml -- BP now 100/40. Continue to monitor mental status  - NS 1L bolus this AM  - Hold Diltiazem  - Continue to Monitor   # Chronic right pleural effusion: known since around [facility] 2015. He has undergone multiple taps (4) showing transudative  process. O2 sat 95-98% on room air. - monitor clinically  # Afib: previously on eliquis but was decided to discontinue in May 2016 and continue on daily aspirin due to [facility] score = 1.  - Holding home diltiazem XR 146m, patient  Blood pressure low   # OSA - continue CPAP  # Cirrhosis: likely alcoholic cirrhosis, underwent workup in 2015 with CT, negative HCV; had attempted MRI but pt was unable to fit in MRI machine. ALT 72  - trend CMP as needed  # Alcohol abuse: states last drink was 2 months ago (which was 2 day binge, prior to that 1-2 months before) - will hold off on CIWA protocol at this time  FEN/GI: diet carb modified Prophylaxis: heparin sq  Disposition: FMT, dispo home   Subjective:  Patient doing well overall. Indicates that his right foot is in a little more pain this morning compared to before. Patient is very concerned about his blood pressure reassured him that we are monitoring. Notes that urine is still brown.  Patient endorse slight nausea with eating.  Patient denies dysuria, palpations, SOB, chest pain, fever, chills   Objective: Temp:  [97.4 F (36.3 C)-98.8 F (37.1 C)] 97.4 F (36.3 C) (08/04 0401) Pulse Rate:  [64-100] 94 (08/04 0401) Resp:  [20-25] 23 (08/04 0600) BP: (82-117)/(28-99) 93/42 mmHg (08/04 0600) SpO2:  [92 %-99 %] 99 % (08/04 0401) Weight:  [369 lb  7.9 oz (167.6 kg)] 369 lb 7.9 oz (167.6 kg) (08/03 1227)  Physical Exam: General: obese, sitting up in chair eating breakfast, NAD  Cardiovascular: RRR, no m/r/g, radial pulses  Respiratory: diminished breath sound, CTAB  Abdomen: large pannus,NTND, BS+ Extremities: Cellulitis on right upper and lower extremity, erthymatous, less tender to touch this AM, warm, decreasing ertheyma. Marked cellulitis. Both Right and Left leg with significant venous stasis.  2+ pitting edema. Could not find DP pulses this AM, Ulcer on side of right foot--> possible source of infection     Laboratory:  Recent Labs Lab 05/02/15 0046 05/02/15 0935 05/03/15 0245  WBC 43.5* 46.7* 44.0*  HGB 15.5 16.7 15.1  HCT 43.1 45.2 42.3  PLT 306 267 264    Recent Labs Lab 05/01/15 1440 05/02/15 0046 05/02/15 0935 05/03/15 0245  NA 134* 133* 133* 130*  K 4.2 4.8 5.4* 5.0  CL 103 102 102 99*  CO2 22 22 21* 21*  BUN 22* 27* 35* 45*  CREATININE 1.10 2.19* 2.16* 2.17*  CALCIUM 8.7* 8.4* 8.2* 7.8*  PROT 7.8  --   --  6.1*  BILITOT 1.0  --   --  1.0  ALKPHOS 134*  --   --  108  ALT 45  --   --  41  AST 72*  --   --  73*  GLUCOSE 93 113* 89 57*    Urinalysis    Component Value Date/Time   COLORURINE BROWN* 05/02/2015 2052   APPEARANCEUR TURBID* 05/02/2015 2052   LABSPEC 1.015 05/02/2015 2052   PHURINE 5.5 05/02/2015 2052   GLUCOSEU NEGATIVE 05/02/2015 2052   HGBUR LARGE* 05/02/2015 2052   BILIRUBINUR MODERATE* 05/02/2015 2052   KETONESUR 15* 05/02/2015 2052   PROTEINUR 100* 05/02/2015 2052   UROBILINOGEN 1.0 05/02/2015 2052   NITRITE NEGATIVE 05/02/2015 2052   LEUKOCYTESUR MODERATE* 05/02/2015 2052    Microbiology  Blood Cultures - 1 day NGTD   Urine Cultures - Re-incubated for better growth   Imaging/Diagnostic Tests:  CXR IMPRESSION: Right pleural effusion and right lower lobe atelectasis/ infiltrate unchanged.  Matthew Mainwaring Cletis Media, MD 05/03/2015, 7:29 AM PGY-1, [place] Intern pager: [phone], text pages welcome

## 2015-05-04 LAB — GLUCOSE, CAPILLARY
GLUCOSE-CAPILLARY: 113 mg/dL — AB (ref 65–99)
Glucose-Capillary: 59 mg/dL — ABNORMAL LOW (ref 65–99)
Glucose-Capillary: 65 mg/dL (ref 65–99)
Glucose-Capillary: 69 mg/dL (ref 65–99)
Glucose-Capillary: 97 mg/dL (ref 65–99)
Glucose-Capillary: 74 mg/dL (ref 65–99)
Glucose-Capillary: 87 mg/dL (ref 65–99)

## 2015-05-04 LAB — COMPREHENSIVE METABOLIC PANEL
ALT: 37 U/L (ref 17–63)
ANION GAP: 10 (ref 5–15)
AST: 62 U/L — AB (ref 15–41)
Albumin: 1.9 g/dL — ABNORMAL LOW (ref 3.5–5.0)
Alkaline Phosphatase: 112 U/L (ref 38–126)
BUN: 43 mg/dL — ABNORMAL HIGH (ref 6–20)
CALCIUM: 7.8 mg/dL — AB (ref 8.9–10.3)
CHLORIDE: 105 mmol/L (ref 101–111)
CO2: 18 mmol/L — AB (ref 22–32)
CREATININE: 1.93 mg/dL — AB (ref 0.61–1.24)
GFR calc non Af Amer: 37 mL/min — ABNORMAL LOW (ref >60)
GFR, EST AFRICAN AMERICAN: 43 mL/min — AB (ref >60)
GLUCOSE: 77 mg/dL (ref 65–99)
Potassium: 4.9 mmol/L (ref 3.5–5.1)
Sodium: 133 mmol/L — ABNORMAL LOW (ref 135–145)
TOTAL PROTEIN: 6.3 g/dL — AB (ref 6.5–8.1)
Total Bilirubin: 0.9 mg/dL (ref 0.3–1.2)

## 2015-05-04 LAB — CBC WITH DIFFERENTIAL/PLATELET
BASOS PCT: 0 % (ref 0–1)
Basophils Absolute: 0 10*3/uL (ref 0.0–0.1)
Eosinophils Absolute: 0 10*3/uL (ref 0.0–0.7)
Eosinophils Relative: 0 % (ref 0–5)
HEMATOCRIT: 43 % (ref 39.0–52.0)
Hemoglobin: 15 g/dL (ref 13.0–17.0)
LYMPHS ABS: 3.5 10*3/uL (ref 0.7–4.0)
LYMPHS PCT: 14 % (ref 12–46)
MCH: 33.6 pg (ref 26.0–34.0)
MCHC: 34.9 g/dL (ref 30.0–36.0)
MCV: 96.4 fL (ref 78.0–100.0)
Monocytes Absolute: 2.3 10*3/uL — ABNORMAL HIGH (ref 0.1–1.0)
Monocytes Relative: 9 % (ref 3–12)
NEUTROS ABS: 19.4 10*3/uL — AB (ref 1.7–7.7)
Neutrophils Relative %: 77 % (ref 43–77)
Platelets: 257 10*3/uL (ref 150–400)
RBC: 4.46 MIL/uL (ref 4.22–5.81)
RDW: 16.4 % — ABNORMAL HIGH (ref 11.5–15.5)
WBC MORPHOLOGY: INCREASED
WBC: 25.2 10*3/uL — ABNORMAL HIGH (ref 4.0–10.5)

## 2015-05-04 LAB — URINE CULTURE: Culture: >=100000

## 2015-05-04 LAB — VANCOMYCIN, TROUGH: Vancomycin Tr: 14 ug/mL (ref 10.0–20.0)

## 2015-05-04 MED ORDER — TRAMADOL HCL 50 MG PO TABS
100.0000 mg | ORAL_TABLET | Freq: Four times a day (QID) | ORAL | Status: DC
  Administered 2015-05-04 – 2015-05-05 (×2): 100 mg via ORAL
  Filled 2015-05-04 (×2): qty 2

## 2015-05-04 MED ORDER — TRAMADOL HCL 50 MG PO TABS
100.0000 mg | ORAL_TABLET | Freq: Once | ORAL | Status: AC
  Administered 2015-05-04: 100 mg via ORAL
  Filled 2015-05-04: qty 2

## 2015-05-04 MED ORDER — PANTOPRAZOLE SODIUM 20 MG PO TBEC
20.0000 mg | DELAYED_RELEASE_TABLET | Freq: Every day | ORAL | Status: DC
  Administered 2015-05-04 – 2015-05-11 (×8): 20 mg via ORAL
  Filled 2015-05-04 (×8): qty 1

## 2015-05-04 NOTE — Progress Notes (Signed)
Complained of right leg pain MD aware, ultram given, continue to monitor,

## 2015-05-04 NOTE — Progress Notes (Signed)
Matthew [name] continues to feel poorly with pain in foot. Anxious about MRI and concerned that he might end up loosing his foot. Agreed that the MRI is the next step towards answers for him and agreed to proceed. States he is doing well with his alcoholism and has been sober for 3 months. Complains of GERD today. Recommend initiation of Protonix. Appreciative of inpatient team for taking care of Matthew [name] throughout his hospitalization.   Dr. Junie Panning, DO Family Medicine, PGY-2

## 2015-05-04 NOTE — Progress Notes (Addendum)
Family Medicine Teaching Service Daily Progress Note Intern Pager: [phone]  Patient name: Matthew [name] Medical record number: [id number] Date of birth: Mar 30, 1956 Age: 59 y.o. Gender: male  Primary Care Provider: Junie Panning, DO Consultants: None Code Status: Full   Pt Overview and Major Events to Date:   Assessment and Plan: Matthew [name] is a 59 y.o. male presenting with sepsis secondary to UTI and cellulitis . PMH is significant for afib, HTN, M0NU, alcoholic cirrhosis, chronic right pleural effusion, OSA  # Sepsis  1. RLE cellulitis : Initial Admission -SIRS tachypenic and leukocytosis overnight. Continues to be hypotensive, however mentation is good.  RLE is edematous, red, warm compared to the left- overall less erythematous today, within lines. Patient does have an ulcer on the right foot, which has been causing him pain this AM. Latic Acid trending down from 4.2 -> 2.7. Blood cultures showing no growth.   - continue vancomycin and meropenum   - WBC 29.5 >43.5> 44 > 25.2  - Continue NS @ 75/ml on this AM - Will need to consult wound for ulcer.    - Will get ESR 33 and CRP 20.9  - Will get  MRI of foot today-r/o osteomyelitis  - Will contact Ortho for follow up with ulcer    2. UTI - On Admission UA showing moderate leuks, on microscopy few bacteria, rare yeast, WBC 11-20; No complaints of burning with urination now.  -   Repeat Urine brown - Many bacteria, Large Hgb, Protein 100, Moderate Leukocytes yesterday.   - Urine Culture- Enterococcus, awaiting sensitivities  - Renal ultrasound -- No hydronephrosis. Benign-appearing cyst of the RIGHT kidney  # AKI - On admission SCr 1.1 > 2.19>2.17> 1.93 NS bolus x 3  - Sr Cr trending down  - NS @ 75/ml on this AM    - Continue to watch BMP   #Hypotensive: Received 2 x Bolus yesterday for blood pressure.Overnight BP 89/43.  Today BP 128/69  Continue to monitor mental status  - Holding Diltiazem  - Continue to Monitor    # Hypoglycemic- CBG 59. - Bed-time snack   # Chronic right pleural effusion: known since around [facility] 2015. He has undergone multiple taps (4) showing transudative process. O2 sat 95-98% on room air. - monitor clinically  # Afib: previously on eliquis but was decided to discontinue in May 2016 and continue on daily aspirin due to [facility] score = 1.  - Holding home diltiazem XR 152m, patient  Blood pressure low   # OSA - continue CPAP, patient continues to refuse CPAP at night   # Cirrhosis: likely alcoholic cirrhosis, underwent workup in 2015 with CT, negative HCV; had attempted MRI but pt was unable to fit in MRI machine. ALT 72  - trend CMP as needed  # Alcohol abuse: states last drink was 2 months ago (which was 2 day binge, prior to that 1-2 months before) - will hold off on CIWA protocol at this time  #FEN/GI: diet carb modified Prophylaxis: heparin sq  Disposition: FMT, dispo home   Subjective:   Patient doing well this morning. He states that leg is still a bit tender with palpation. However, otherwise patient is doing okay. Denies any other pain or other complaints. Patient states he has a good appetite   Objective: Temp:  [97.8 F (36.6 C)-98.5 F (36.9 C)] 98.1 F (36.7 C) (08/05 0345) Pulse Rate:  [76-84] 84 (08/04 2334) Resp:  [17-24] 18 (08/05 0600) BP: (76-128)/(31-97) 128/69 mmHg (  08/05 0600) SpO2:  [94 %-100 %] 98 % (08/05 0345)  Physical Exam: General: obese, sitting up in chair eating breakfast, NAD  Cardiovascular: RRR, no m/r/g, radial pulses  Respiratory: diminished breath sound, CTAB  Abdomen: large pannus,NTND, BS+ Extremities: Cellulitis on right upper and lower extremity, erthymatous, continued tenderness to touch this AM, warm, decreasing ertheyma. Marked cellulitis. Both Right and Left leg with significant venous stasis.  2+ pitting edema. Could not find DP pulses this AM, Ulcer, right side of foot-> possible source of  infection  Laboratory:  Recent Labs Lab 05/02/15 0935 05/03/15 0245 05/04/15 0343  WBC 46.7* 44.0* 25.2*  HGB 16.7 15.1 15.0  HCT 45.2 42.3 43.0  PLT 267 264 257    Recent Labs Lab 05/01/15 1440  05/02/15 0935 05/03/15 0245 05/04/15 0343  NA 134*  < > 133* 130* 133*  K 4.2  < > 5.4* 5.0 4.9  CL 103  < > 102 99* 105  CO2 22  < > 21* 21* 18*  BUN 22*  < > 35* 45* 43*  CREATININE 1.10  < > 2.16* 2.17* 1.93*  CALCIUM 8.7*  < > 8.2* 7.8* 7.8*  PROT 7.8  --   --  6.1* 6.3*  BILITOT 1.0  --   --  1.0 0.9  ALKPHOS 134*  --   --  108 112  ALT 45  --   --  41 37  AST 72*  --   --  73* 62*  GLUCOSE 93  < > 89 57* 77  < > = values in this interval not displayed.  Urinalysis    Component Value Date/Time   COLORURINE BROWN* 05/02/2015 2052   APPEARANCEUR TURBID* 05/02/2015 2052   LABSPEC 1.015 05/02/2015 2052   PHURINE 5.5 05/02/2015 2052   GLUCOSEU NEGATIVE 05/02/2015 2052   HGBUR LARGE* 05/02/2015 2052   BILIRUBINUR MODERATE* 05/02/2015 2052   KETONESUR 15* 05/02/2015 2052   PROTEINUR 100* 05/02/2015 2052   UROBILINOGEN 1.0 05/02/2015 2052   NITRITE NEGATIVE 05/02/2015 2052   LEUKOCYTESUR MODERATE* 05/02/2015 2052    Microbiology  Blood Cultures - 3 x day NGTD   Urine Cultures - >=100,000 COLONIES/mL ENTEROCOCCUS SPECIES   Imaging/Diagnostic Tests: Dg Chest 2 View  05/01/2015   CLINICAL DATA:  New tachypneic, leukocytosis.  Fever and chills.  EXAM: CHEST  2 VIEW  COMPARISON:  03/10/2015  FINDINGS: Small right pleural effusion is unchanged. There is right lower lobe atelectasis/ infiltrate also unchanged.  Left lung remains clear  Heart size upper normal.  Mild vascular congestion without edema.  IMPRESSION: Right pleural effusion and right lower lobe atelectasis/ infiltrate unchanged.   Electronically Signed   By: Franchot Gallo M.D.   On: 05/01/2015 15:43   US Renal  05/03/2015   CLINICAL DATA:  Acute renal insufficiency  EXAM: RENAL / URINARY TRACT ULTRASOUND  COMPLETE  COMPARISON:  Ultrasound 03/10/2015  FINDINGS: Right Kidney:  Length: 13.3 cm. Anechoic cyst in the upper pole measuring 6.6 cm. No hydronephrosis.  Left Kidney:  Length: 13.1 cm.  No hydronephrosis.  Bladder:  Appears normal for degree of bladder distention.  IMPRESSION: No hydronephrosis.  Benign-appearing cyst of the RIGHT kidney   Electronically Signed   By: Suzy Bouchard M.D.   On: 05/03/2015 16:30    Teiana Hajduk Cletis Media, MD 05/04/2015, 7:32 AM PGY-1, [place] Intern pager: [phone], text pages welcome

## 2015-05-04 NOTE — Progress Notes (Signed)
Rt called due to patient wanting to be placed on CPAP. Upon entering room patient decided he did not want to wear it tonight and stated he wanted to try it tomorrow night. RT informed patient if he changes his mind have RN contact RT.

## 2015-05-04 NOTE — Progress Notes (Signed)
Visited patient to assess blood pressures. Reading at 2329 was 117/45. No need for fluid bolus at this time. Patient was awaiting RT for nightly CPAP.

## 2015-05-04 NOTE — Progress Notes (Signed)
At around 8:am cbg- 65, asymptomatic, orange juice 120 ml given tolerated well, repeat cbg was -69, asymptomatic, breakfast served tolerated 100%, repeat cbg-97. md aware cont. To monitor.

## 2015-05-04 NOTE — Progress Notes (Signed)
ANTIBIOTIC CONSULT NOTE - FOLLOW UP  Pharmacy Consult:  Vancomycin Indication:  Cellulitis (possible osteomyelitis) + Possible urosepsis  Allergies  Allergen Reactions  . Potassium-Containing Compounds Other (See Comments)    Stomach Pain  . Augmentin [Amoxicillin-Pot Clavulanate] Nausea Only    Most of the time it is okay, but has had some bad experiences with it.  [name] Neomycin Rash    Patient Measurements: Height: 5\' 10"  (177.8 cm) Weight: (!) 369 lb 7.9 oz (167.6 kg) IBW/kg (Calculated) : 73  Vital Signs: Temp: 98.2 F (36.8 C) (08/05 1929) Temp Source: Oral (08/05 1929) BP: 106/57 mmHg (08/05 1929) Pulse Rate: 87 (08/05 1929) Intake/Output from previous day: 08/04 0701 - 08/05 0700 In: 5772.5 [P.O.:2932; I.V.:1540.5; IV Piggyback:1300] Out: 4710 [Urine:4710]  Labs:  Recent Labs  05/02/15 0935 05/03/15 0245 05/04/15 0343  WBC 46.7* 44.0* 25.2*  HGB 16.7 15.1 15.0  PLT 267 264 257  CREATININE 2.16* 2.17* 1.93*   Estimated Creatinine Clearance: 66.2 mL/min (by C-G formula based on Cr of 1.93).  Recent Labs  05/04/15 1901  [place] [facility]     Microbiology: Recent Results (from the past 720 hour(s))  Blood culture (routine x 2)     Status: None (Preliminary result)   Collection Time: 05/01/15  1:50 PM  Result Value Ref Range Status   Specimen Description BLOOD RIGHT ARM  Final   Special Requests BOTTLES DRAWN AEROBIC AND ANAEROBIC 5CC  Final   Culture   Final    NO GROWTH 3 DAYS Performed at [facility]    Report Status PENDING  Incomplete  Blood culture (routine x 2)     Status: None (Preliminary result)   Collection Time: 05/01/15  1:50 PM  Result Value Ref Range Status   Specimen Description BLOOD LEFT HAND  Final   Special Requests BOTTLES DRAWN AEROBIC AND ANAEROBIC 5CC  Final   Culture   Final    NO GROWTH 3 DAYS Performed at [facility]    Report Status PENDING  Incomplete  Culture, Urine     Status: None   Collection Time:  05/01/15  2:23 PM  Result Value Ref Range Status   Specimen Description URINE, RANDOM  Final   Special Requests NONE  Final   Culture   Final    >=100,000 COLONIES/mL ENTEROCOCCUS SPECIES Performed at [facility]    Report Status 05/04/2015 FINAL  Final   Organism ID, Bacteria ENTEROCOCCUS SPECIES  Final      Susceptibility   Enterococcus species - MIC*    AMPICILLIN <=2 SENSITIVE Sensitive     LEVOFLOXACIN 1 SENSITIVE Sensitive     NITROFURANTOIN <=16 SENSITIVE Sensitive     VANCOMYCIN 1 SENSITIVE Sensitive     LINEZOLID 2 SENSITIVE Sensitive     * >=100,000 COLONIES/mL ENTEROCOCCUS SPECIES  MRSA PCR Screening     Status: None   Collection Time: 05/02/15 12:18 PM  Result Value Ref Range Status   MRSA by PCR NEGATIVE NEGATIVE Final    Comment:        The GeneXpert MRSA Assay (FDA approved for NASAL specimens only), is one component of a comprehensive MRSA colonization surveillance program. It is not intended to diagnose MRSA infection nor to guide or monitor treatment for MRSA infections.       Assessment: 35 YOM started on vancomycin and meropenem for cellulitis and possible urosepsis.  Urine culture grew Enterococcus and now concerned with osteomyelitis, awaiting MRI.  Patient's AKI is improving slowly.  His vancomycin level is slightly sub-therapeutic, but it was drawn 1.5 hours late so expect the true trough to be therapeutic.  Vancomycin trough was checked prior to the 3rd maintenance dose due to concern with obesity, so expect vancomycin to accumulate as patient continues on this therapy.  Rocephin x1 on 8/2 Vanc 8/2 >> Merrem 8/3 >>  8/5 VT = 14 mcg/mL on 1750mg  q24 (SCr 1.93) - no change  8/2 BCx x2 - NGTD 8/2 UCx - Enterococcus (pan sensitive)   Goal of Therapy:  Vancomycin trough level 15-20 mcg/ml until osteo is ruled out   Plan:  - Continue vanc 1750mg  IV Q24H - Monitor renal fxn closely, repeat vanc trough as indicated - F/U MRI and adjust  vanc trough goal as appropriate    Semone Orlov D. Mina Marble, PharmD, BCPS Pager:  [phone] 05/04/2015, 8:00 PM

## 2015-05-04 NOTE — Consult Note (Signed)
Reason for Consult:  Right leg cellulitis, right foot callous/ulcer Referring Physician:  Kyle Fletke MD, FPTS Attending  Matthew [name] is an 59 y.o. male.  HPI: Patient is a 59 year old make who was seen at [facility] on Tuesday for shaking chills and nausea. He was found to have significant cellulitis of the right lower extremity and chronic venous stasis changes.  He had known history of recurrent UTIs and along with significant medical history.  He developed signs and symptoms of sepsis and was transferred to [facility] hospital for further treatment and eventually ended up in the heart unit for further monitoring and care. Ortho consult called to assess the right leg to see if foot possible source of infection.  Past Medical History  Diagnosis Date  . Atrial fibrillation, chronic   . Hypertension   . GERD (gastroesophageal reflux disease)   . Septic shock   . CHF (congestive heart failure)   . Shortness of breath dyspnea     Past Surgical History  Procedure Laterality Date  . Splenectomy    . Tonsilectomy, adenoidectomy, bilateral myringotomy and tubes    . Knee arthroscopy    . Hernia repair      History reviewed. No pertinent family history.  Social History:  reports that he quit smoking about 40 years ago. His smoking use included Cigarettes. He has never used smokeless tobacco. He reports that he drinks alcohol. His drug history is not on file.  Allergies:  Allergies  Allergen Reactions  . Potassium-Containing Compounds Other (See Comments)    Stomach Pain  . Augmentin [Amoxicillin-Pot Clavulanate] Nausea Only    Most of the time it is okay, but has had some bad experiences with it.  . Neomycin Rash    Medications: I have reviewed the patient's current medications. Currently on Heparin. IV Merrem and IV Vancomycin  Results for orders placed or performed during the hospital encounter of 05/01/15 (from the past 48 hour(s))  Urinalysis with microscopic      Status: Abnormal   Collection Time: 05/02/15  8:52 PM  Result Value Ref Range   Color, Urine BROWN (A) YELLOW    Comment: BIOCHEMICALS MAY BE AFFECTED BY COLOR   APPearance TURBID (A) CLEAR   Specific Gravity, Urine 1.015 1.005 - 1.030   pH 5.5 5.0 - 8.0   Glucose, UA NEGATIVE NEGATIVE mg/dL   Hgb urine dipstick LARGE (A) NEGATIVE   Bilirubin Urine MODERATE (A) NEGATIVE   Ketones, ur 15 (A) NEGATIVE mg/dL   Protein, ur 100 (A) NEGATIVE mg/dL   Urobilinogen, UA 1.0 0.0 - 1.0 mg/dL   Nitrite NEGATIVE NEGATIVE   Leukocytes, UA MODERATE (A) NEGATIVE   WBC, UA 11-20 <3 WBC/hpf   RBC / HPF 21-50 <3 RBC/hpf   Bacteria, UA MANY (A) RARE   Squamous Epithelial / LPF RARE RARE  CBC with Differential/Platelet     Status: Abnormal   Collection Time: 05/03/15  2:45 AM  Result Value Ref Range   WBC 44.0 (H) 4.0 - 10.5 K/uL   RBC 4.42 4.22 - 5.81 MIL/uL   Hemoglobin 15.1 13.0 - 17.0 g/dL   HCT 42.3 39.0 - 52.0 %   MCV 95.7 78.0 - 100.0 fL   MCH 34.2 (H) 26.0 - 34.0 pg   MCHC 35.7 30.0 - 36.0 g/dL   RDW 15.9 (H) 11.5 - 15.5 %   Platelets 264 150 - 400 K/uL   Neutrophils Relative % 93 (H) 43 - 77 %     Lymphocytes Relative 3 (L) 12 - 46 %   Monocytes Relative 4 3 - 12 %   Eosinophils Relative 0 0 - 5 %   Basophils Relative 0 0 - 1 %   Neutro Abs 40.9 (H) 1.7 - 7.7 K/uL   Lymphs Abs 1.3 0.7 - 4.0 K/uL   Monocytes Absolute 1.8 (H) 0.1 - 1.0 K/uL   Eosinophils Absolute 0.0 0.0 - 0.7 K/uL   Basophils Absolute 0.0 0.0 - 0.1 K/uL   WBC Morphology INCREASED BANDS (>20% BANDS)   Comprehensive metabolic panel     Status: Abnormal   Collection Time: 05/03/15  2:45 AM  Result Value Ref Range   Sodium 130 (L) 135 - 145 mmol/L   Potassium 5.0 3.5 - 5.1 mmol/L   Chloride 99 (L) 101 - 111 mmol/L   CO2 21 (L) 22 - 32 mmol/L   Glucose, Bld 57 (L) 65 - 99 mg/dL   BUN 45 (H) 6 - 20 mg/dL   Creatinine, Ser 2.17 (H) 0.61 - 1.24 mg/dL   Calcium 7.8 (L) 8.9 - 10.3 mg/dL   Total Protein 6.1 (L) 6.5 - 8.1  g/dL   Albumin 1.9 (L) 3.5 - 5.0 g/dL   AST 73 (H) 15 - 41 U/L   ALT 41 17 - 63 U/L   Alkaline Phosphatase 108 38 - 126 U/L   Total Bilirubin 1.0 0.3 - 1.2 mg/dL   GFR calc non Af Amer 32 (L) >60 mL/min   GFR calc Af Amer 37 (L) >60 mL/min    Comment: (NOTE) The eGFR has been calculated using the CKD EPI equation. This calculation has not been validated in all clinical situations. eGFR's persistently <60 mL/min signify possible Chronic Kidney Disease.    Anion gap 10 5 - 15  CK     Status: None   Collection Time: 05/03/15 11:33 AM  Result Value Ref Range   Total CK 204 49 - 397 U/L  Sedimentation rate     Status: Abnormal   Collection Time: 05/03/15 11:33 AM  Result Value Ref Range   Sed Rate 33 (H) 0 - 16 mm/hr  C-reactive protein     Status: Abnormal   Collection Time: 05/03/15 11:33 AM  Result Value Ref Range   CRP 20.9 (H) <1.0 mg/dL  Glucose, capillary     Status: None   Collection Time: 05/03/15 11:53 AM  Result Value Ref Range   Glucose-Capillary 97 65 - 99 mg/dL   Comment 1 Capillary Specimen   Glucose, capillary     Status: None   Collection Time: 05/03/15  5:22 PM  Result Value Ref Range   Glucose-Capillary 96 65 - 99 mg/dL   Comment 1 Capillary Specimen   Glucose, capillary     Status: None   Collection Time: 05/03/15  9:15 PM  Result Value Ref Range   Glucose-Capillary 93 65 - 99 mg/dL   Comment 1 Capillary Specimen   CBC with Differential/Platelet     Status: Abnormal   Collection Time: 05/04/15  3:43 AM  Result Value Ref Range   WBC 25.2 (H) 4.0 - 10.5 K/uL   RBC 4.46 4.22 - 5.81 MIL/uL   Hemoglobin 15.0 13.0 - 17.0 g/dL   HCT 43.0 39.0 - 52.0 %   MCV 96.4 78.0 - 100.0 fL   MCH 33.6 26.0 - 34.0 pg   MCHC 34.9 30.0 - 36.0 g/dL   RDW 16.4 (H) 11.5 - 15.5 %   Platelets 257 150 - 400   K/uL   Neutrophils Relative % 77 43 - 77 %   Lymphocytes Relative 14 12 - 46 %   Monocytes Relative 9 3 - 12 %   Eosinophils Relative 0 0 - 5 %   Basophils Relative 0 0  - 1 %   Neutro Abs 19.4 (H) 1.7 - 7.7 K/uL   Lymphs Abs 3.5 0.7 - 4.0 K/uL   Monocytes Absolute 2.3 (H) 0.1 - 1.0 K/uL   Eosinophils Absolute 0.0 0.0 - 0.7 K/uL   Basophils Absolute 0.0 0.0 - 0.1 K/uL   RBC Morphology TARGET CELLS    WBC Morphology INCREASED BANDS (>20% BANDS)     Comment: TOXIC GRANULATION VACUOLATED NEUTROPHILS   Comprehensive metabolic panel     Status: Abnormal   Collection Time: 05/04/15  3:43 AM  Result Value Ref Range   Sodium 133 (L) 135 - 145 mmol/L   Potassium 4.9 3.5 - 5.1 mmol/L   Chloride 105 101 - 111 mmol/L   CO2 18 (L) 22 - 32 mmol/L   Glucose, Bld 77 65 - 99 mg/dL   BUN 43 (H) 6 - 20 mg/dL   Creatinine, Ser 1.93 (H) 0.61 - 1.24 mg/dL   Calcium 7.8 (L) 8.9 - 10.3 mg/dL   Total Protein 6.3 (L) 6.5 - 8.1 g/dL   Albumin 1.9 (L) 3.5 - 5.0 g/dL   AST 62 (H) 15 - 41 U/L   ALT 37 17 - 63 U/L   Alkaline Phosphatase 112 38 - 126 U/L   Total Bilirubin 0.9 0.3 - 1.2 mg/dL   GFR calc non Af Amer 37 (L) >60 mL/min   GFR calc Af Amer 43 (L) >60 mL/min    Comment: (NOTE) The eGFR has been calculated using the CKD EPI equation. This calculation has not been validated in all clinical situations. eGFR's persistently <60 mL/min signify possible Chronic Kidney Disease.    Anion gap 10 5 - 15  Glucose, capillary     Status: Abnormal   Collection Time: 05/04/15  7:28 AM  Result Value Ref Range   Glucose-Capillary 59 (L) 65 - 99 mg/dL   Comment 1 Capillary Specimen   Glucose, capillary     Status: None   Collection Time: 05/04/15  7:29 AM  Result Value Ref Range   Glucose-Capillary 65 65 - 99 mg/dL   Comment 1 Capillary Specimen   Glucose, capillary     Status: None   Collection Time: 05/04/15  7:53 AM  Result Value Ref Range   Glucose-Capillary 69 65 - 99 mg/dL   Comment 1 Capillary Specimen   Glucose, capillary     Status: None   Collection Time: 05/04/15  8:59 AM  Result Value Ref Range   Glucose-Capillary 97 65 - 99 mg/dL   Comment 1 Capillary  Specimen   Glucose, capillary     Status: None   Collection Time: 05/04/15 11:25 AM  Result Value Ref Range   Glucose-Capillary 74 65 - 99 mg/dL   Comment 1 Capillary Specimen   Glucose, capillary     Status: None   Collection Time: 05/04/15  5:03 PM  Result Value Ref Range   Glucose-Capillary 87 65 - 99 mg/dL   Comment 1 Capillary Specimen     US Renal  05/03/2015   CLINICAL DATA:  Acute renal insufficiency  EXAM: RENAL / URINARY TRACT ULTRASOUND COMPLETE  COMPARISON:  Ultrasound 03/10/2015  FINDINGS: Right Kidney:  Length: 13.3 cm. Anechoic cyst in the upper pole measuring 6.6 cm.  No hydronephrosis.  Left Kidney:  Length: 13.1 cm.  No hydronephrosis.  Bladder:  Appears normal for degree of bladder distention.  IMPRESSION: No hydronephrosis.  Benign-appearing cyst of the RIGHT kidney   Electronically Signed   By: Stewart  Edmunds M.D.   On: 05/03/2015 16:30    ROS Blood pressure 100/52, pulse 82, temperature 97.7 F (36.5 C), temperature source Oral, resp. rate 20, height 5' 10" (1.778 m), weight 167.6 kg (369 lb 7.9 oz), SpO2 96 %. Physical Exam Patient is a morbidly obese 58 year old white male seen sitting up in a cardiac chair in the heart unit with the right leg and foot on the floor. He is alert and appears to be a good historian.  Right lower extremity with extensive erythema extending from level of right ankle to the proximal third of the right thigh.  Cellulitis is marked with pen upon admission.  Erythema has subsided slightly based upon the markings of the right leg.  The distal thigh and lower leg is warm to the touch and tender to palpation.  Pitting edema in the right leg greater than the left leg.  Moderate swelling and edema noted into the dorsum of the right foot but no significant erythema noted in the foot. Quarter sized callous noted over the base of the fifth MT of the right foot.  No erythema noted and no obvious fluctuance associated with the callous. Chronic venous  stasis changes noted on both legs.  Assessment/Plan: Right lower extremity cellulitis  The foot does not appear to be involved and the callous looks more chronic and not significantly painful on exam.  Does not appear to be the obvious source.  He does have an MRI pending at this time.  Looks more like an extensive soft tissue cellulitis in face of chronic vessel disease and stasis.  More likely urine source but will wait to see what MRI shows.  PERKINS, ALEXZANDREW 05/04/2015, 7:37 PM     I have seen and examined the patient and agree with the above note. He has a significant cellulitis RLE in the setting of chronic venous stasis and has a longstanding callous/blister plantar/lateral aspect of his right midfoot. He does not have any drainage or warmth associated with the blistered area and no associated erythema around it. He has 3 + edema in his RLE and severe cellulitis. Recommend proceeding with MRI as already ordered and surgery only if there is a drainable abscess. He needs strict elevation of the right leg on 2-3 pillows above heart level to try and decrease the edema which will help treat the cellulitis also. My partners will follow him over the weekend. 

## 2015-05-05 ENCOUNTER — Inpatient Hospital Stay (HOSPITAL_COMMUNITY): Payer: Medicaid Other

## 2015-05-05 DIAGNOSIS — I480 Paroxysmal atrial fibrillation: Secondary | ICD-10-CM

## 2015-05-05 DIAGNOSIS — L97519 Non-pressure chronic ulcer of other part of right foot with unspecified severity: Secondary | ICD-10-CM

## 2015-05-05 LAB — BASIC METABOLIC PANEL
ANION GAP: 9 (ref 5–15)
BUN: 38 mg/dL — AB (ref 6–20)
CALCIUM: 7.9 mg/dL — AB (ref 8.9–10.3)
CO2: 20 mmol/L — ABNORMAL LOW (ref 22–32)
Chloride: 105 mmol/L (ref 101–111)
Creatinine, Ser: 1.6 mg/dL — ABNORMAL HIGH (ref 0.61–1.24)
GFR calc non Af Amer: 46 mL/min — ABNORMAL LOW (ref >60)
GFR, EST AFRICAN AMERICAN: 54 mL/min — AB (ref >60)
Glucose, Bld: 82 mg/dL (ref 65–99)
Potassium: 4.9 mmol/L (ref 3.5–5.1)
Sodium: 134 mmol/L — ABNORMAL LOW (ref 135–145)

## 2015-05-05 LAB — GLUCOSE, CAPILLARY: Glucose-Capillary: 75 mg/dL (ref 65–99)

## 2015-05-05 LAB — CBC
HEMATOCRIT: 42 % (ref 39.0–52.0)
Hemoglobin: 14.9 g/dL (ref 13.0–17.0)
MCH: 33.3 pg (ref 26.0–34.0)
MCHC: 35.5 g/dL (ref 30.0–36.0)
MCV: 94 fL (ref 78.0–100.0)
Platelets: 283 10*3/uL (ref 150–400)
RBC: 4.47 MIL/uL (ref 4.22–5.81)
RDW: 16.5 % — AB (ref 11.5–15.5)
WBC: 19.3 10*3/uL — ABNORMAL HIGH (ref 4.0–10.5)

## 2015-05-05 MED ORDER — SALINE SPRAY 0.65 % NA SOLN
1.0000 | NASAL | Status: DC | PRN
  Filled 2015-05-05: qty 44

## 2015-05-05 MED ORDER — POLYETHYLENE GLYCOL 3350 17 G PO PACK
17.0000 g | PACK | Freq: Every day | ORAL | Status: DC
  Administered 2015-05-05 – 2015-05-09 (×5): 17 g via ORAL
  Filled 2015-05-05 (×7): qty 1

## 2015-05-05 MED ORDER — TRAMADOL HCL 50 MG PO TABS
100.0000 mg | ORAL_TABLET | Freq: Four times a day (QID) | ORAL | Status: DC | PRN
  Administered 2015-05-05 – 2015-05-10 (×7): 100 mg via ORAL
  Filled 2015-05-05 (×8): qty 2

## 2015-05-05 MED ORDER — CALCIUM CARBONATE ANTACID 500 MG PO CHEW
1.0000 | CHEWABLE_TABLET | Freq: Three times a day (TID) | ORAL | Status: DC
  Administered 2015-05-05 – 2015-05-11 (×16): 200 mg via ORAL
  Filled 2015-05-05 (×19): qty 1

## 2015-05-05 MED ORDER — GADOBENATE DIMEGLUMINE 529 MG/ML IV SOLN
20.0000 mL | Freq: Once | INTRAVENOUS | Status: AC | PRN
  Administered 2015-05-05: 20 mL via INTRAVENOUS

## 2015-05-05 NOTE — Progress Notes (Signed)
Placed patient on CPAP with pressure of 10.0cmH2O. Patient is tolerating it well and RT will continue to monitor.

## 2015-05-05 NOTE — Progress Notes (Signed)
Family Medicine Teaching Service Daily Progress Note Intern Pager: [phone]  Patient name: Matthew [name] Medical record number: [id number] Date of birth: 02/23/1956 Age: 59 y.o. Gender: male  Primary Care Provider: Junie Panning, DO Consultants: Ortho Code Status: Full   Pt Overview and Major Events to Date:  8/2 - admit for cellulitis 8/3 - WBC spike and transfer to SDU  Assessment and Plan: Matthew [name] is a 59 y.o. male presenting with sepsis secondary to UTI and cellulitis . PMH is significant for afib, HTN, D5HG, alcoholic cirrhosis, chronic right pleural effusion, OSA  # Sepsis 2/2 RLE cellulitis and Enterococcus UTI with hypotension: BP improving, no bolus required in >24h. WBC downtrending. Cellulitis somewhat beyond markings this AM. CRP elevated to 20.9 with ulcer over base of 5th metatarsal of R foot, concerning for possible abscess or osteomyelitis. - continue vancomycin and meropenum per pharmacy  - f/u  MRI R foot - Ortho consulted, appreciate recs - awaiting MRI - continue to monitor BP - SLIV  # AKI - Improving. Cr 1.60 (8/6) - baseline ~1. - Continue to monitor  # Hypoglycemia - Improving, likely 2/2 sepsis and lower PO intake than at home - Bed-time snack   # Chronic right pleural effusion: known since around [facility] 2015. He has undergone multiple taps (4) showing transudative process. O2 sat 95-98% on room air. - monitor clinically  # Afib: Stable, rate controlled, currently NSR. - Continue home aspirin - Holding home diltiazem XR 180mg  2/2 soft BPs - will reevaluate later today and attempt to add some back   # OSA: Patient refusing CPAP - CPAP ordered  # Cirrhosis: likely alcoholic cirrhosis, underwent workup in 2015 with CT, negative HCV; had attempted MRI but pt was unable to fit in MRI machine. ALT 72   # Alcohol abuse: Stable, no signs of withdrawal, states last drink was 2 months ago - continue to monitor, not on CIWA  currently  #FEN/GI: diet carb modified, SLIV Prophylaxis: heparin sq  Disposition: SDU, d/c pending clinical improvement and transition to PO abx.  Subjective: Patient doing well this morning. He states that leg is still a bit tender with palpation and standing. Denies other complaints.  Objective: Temp:  [97.7 F (36.5 C)-98.4 F (36.9 C)] 97.8 F (36.6 C) (08/06 0811) Pulse Rate:  [80-92] 80 (08/06 0811) Resp:  [15-24] 20 (08/06 0811) BP: (100-137)/(46-73) 122/46 mmHg (08/06 0811) SpO2:  [92 %-96 %] 94 % (08/06 0811)  Physical Exam: General: obese, sitting up in chair eating breakfast, NAD  Cardiovascular: RRR, no m/r/g, radial pulses  Respiratory: diminished breath sounds diffusely, CTAB  Abdomen: large pannus, NTND, BS+ Extremities: RLE erythematous, TTP, warm. Marked cellulitis, now beyond markings. Both Right and Left leg with significant venous stasis.  3+ pitting edema.  Ulcer over base of 5th metatarsal  Laboratory:  Recent Labs Lab 05/03/15 0245 05/04/15 0343 05/05/15 0300  WBC 44.0* 25.2* 19.3*  HGB 15.1 15.0 14.9  HCT 42.3 43.0 42.0  PLT 264 257 283    Recent Labs Lab 05/01/15 1440  05/03/15 0245 05/04/15 0343 05/05/15 0300  NA 134*  < > 130* 133* 134*  K 4.2  < > 5.0 4.9 4.9  CL 103  < > 99* 105 105  CO2 22  < > 21* 18* 20*  BUN 22*  < > 45* 43* 38*  CREATININE 1.10  < > 2.17* 1.93* 1.60*  CALCIUM 8.7*  < > 7.8* 7.8* 7.9*  PROT 7.8  --  6.1* 6.3*  --   BILITOT 1.0  --  1.0 0.9  --   ALKPHOS 134*  --  108 112  --   ALT 45  --  41 37  --   AST 72*  --  73* 62*  --   GLUCOSE 93  < > 57* 77 82  < > = values in this interval not displayed.  Urinalysis    Component Value Date/Time   COLORURINE BROWN* 05/02/2015 2052   APPEARANCEUR TURBID* 05/02/2015 2052   LABSPEC 1.015 05/02/2015 2052   PHURINE 5.5 05/02/2015 2052   GLUCOSEU NEGATIVE 05/02/2015 2052   HGBUR LARGE* 05/02/2015 2052   BILIRUBINUR MODERATE* 05/02/2015 2052   KETONESUR 15*  05/02/2015 2052   PROTEINUR 100* 05/02/2015 2052   UROBILINOGEN 1.0 05/02/2015 2052   NITRITE NEGATIVE 05/02/2015 2052   LEUKOCYTESUR MODERATE* 05/02/2015 2052    Microbiology  Blood Cultures - 3 x day NGTD   Urine Cultures - >=100,000 COLONIES/mL ENTEROCOCCUS SPECIES - pan sensitive  Imaging/Diagnostic Tests: Dg Chest 2 View  05/01/2015   CLINICAL DATA:  New tachypneic, leukocytosis.  Fever and chills.  EXAM: CHEST  2 VIEW  COMPARISON:  03/10/2015  FINDINGS: Small right pleural effusion is unchanged. There is right lower lobe atelectasis/ infiltrate also unchanged.  Left lung remains clear  Heart size upper normal.  Mild vascular congestion without edema.  IMPRESSION: Right pleural effusion and right lower lobe atelectasis/ infiltrate unchanged.   Electronically Signed   By: Franchot Gallo M.D.   On: 05/01/2015 15:43   US Renal  05/03/2015   CLINICAL DATA:  Acute renal insufficiency  EXAM: RENAL / URINARY TRACT ULTRASOUND COMPLETE  COMPARISON:  Ultrasound 03/10/2015  FINDINGS: Right Kidney:  Length: 13.3 cm. Anechoic cyst in the upper pole measuring 6.6 cm. No hydronephrosis.  Left Kidney:  Length: 13.1 cm.  No hydronephrosis.  Bladder:  Appears normal for degree of bladder distention.  IMPRESSION: No hydronephrosis.  Benign-appearing cyst of the RIGHT kidney   Electronically Signed   By: Suzy Bouchard M.D.   On: 05/03/2015 16:30    Virginia Crews, MD, MPH 05/05/2015, 8:28 AM PGY-2, [place] Intern pager: [phone], text pages welcome

## 2015-05-05 NOTE — Progress Notes (Signed)
Orthopedics Progress Note  Subjective: The leg feels better, actually the wrist/IV site hurts the worst  Objective:  Filed Vitals:   05/05/15 1229  BP: 140/103  Pulse:   Temp: 97.4 F (36.3 C)  Resp: 16    General: Awake and alert  Musculoskeletal: right leg cellulitis from the foot to the hip/groin. Ankle ROM and knee ROM without pain, no fluctuance Neurovascularly intact. Compartments supple  Lab Results  Component Value Date   WBC 19.3* 05/05/2015   HGB 14.9 05/05/2015   HCT 42.0 05/05/2015   MCV 94.0 05/05/2015   PLT 283 05/05/2015       Component Value Date/Time   NA 134* 05/05/2015 0300   K 4.9 05/05/2015 0300   CL 105 05/05/2015 0300   CO2 20* 05/05/2015 0300   GLUCOSE 82 05/05/2015 0300   BUN 38* 05/05/2015 0300   CREATININE 1.60* 05/05/2015 0300   CREATININE 0.75 01/25/2014 1655   CALCIUM 7.9* 05/05/2015 0300   GFRNONAA 46* 05/05/2015 0300   GFRAA 54* 05/05/2015 0300    Lab Results  Component Value Date   INR 1.43 03/10/2015   INR 1.42 02/15/2015   INR 1.56* 01/30/2015    Assessment/Plan: Right leg cellulitis. MRI neg for abscess. Recommend current IV antibiotic regimen and medical management  Limb elevation is key  Doran Heater. Veverly Fells, MD 05/05/2015 2:52 PM

## 2015-05-06 LAB — BASIC METABOLIC PANEL
Anion gap: 7 (ref 5–15)
BUN: 33 mg/dL — AB (ref 6–20)
CO2: 23 mmol/L (ref 22–32)
Calcium: 8.1 mg/dL — ABNORMAL LOW (ref 8.9–10.3)
Chloride: 103 mmol/L (ref 101–111)
Creatinine, Ser: 1.51 mg/dL — ABNORMAL HIGH (ref 0.61–1.24)
GFR calc Af Amer: 57 mL/min — ABNORMAL LOW (ref >60)
GFR calc non Af Amer: 50 mL/min — ABNORMAL LOW (ref >60)
Glucose, Bld: 77 mg/dL (ref 65–99)
POTASSIUM: 5.4 mmol/L — AB (ref 3.5–5.1)
Sodium: 133 mmol/L — ABNORMAL LOW (ref 135–145)

## 2015-05-06 LAB — CBC
HCT: 44.1 % (ref 39.0–52.0)
HEMOGLOBIN: 15.3 g/dL (ref 13.0–17.0)
MCH: 33.2 pg (ref 26.0–34.0)
MCHC: 34.7 g/dL (ref 30.0–36.0)
MCV: 95.7 fL (ref 78.0–100.0)
PLATELETS: 264 10*3/uL (ref 150–400)
RBC: 4.61 MIL/uL (ref 4.22–5.81)
RDW: 16.5 % — AB (ref 11.5–15.5)
WBC: 18 10*3/uL — ABNORMAL HIGH (ref 4.0–10.5)

## 2015-05-06 LAB — GLUCOSE, CAPILLARY
GLUCOSE-CAPILLARY: 75 mg/dL (ref 65–99)
GLUCOSE-CAPILLARY: 97 mg/dL (ref 65–99)
Glucose-Capillary: 68 mg/dL (ref 65–99)
Glucose-Capillary: 79 mg/dL (ref 65–99)
Glucose-Capillary: 61 mg/dL — ABNORMAL LOW (ref 65–99)
Glucose-Capillary: 112 mg/dL — ABNORMAL HIGH (ref 65–99)

## 2015-05-06 LAB — CULTURE, BLOOD (ROUTINE X 2)
CULTURE: NO GROWTH
CULTURE: NO GROWTH

## 2015-05-06 MED ORDER — DOXYCYCLINE HYCLATE 100 MG PO TABS
100.0000 mg | ORAL_TABLET | Freq: Two times a day (BID) | ORAL | Status: DC
  Administered 2015-05-06 – 2015-05-07 (×3): 100 mg via ORAL
  Filled 2015-05-06 (×5): qty 1

## 2015-05-06 MED ORDER — DILTIAZEM HCL ER 60 MG PO CP12
60.0000 mg | ORAL_CAPSULE | Freq: Two times a day (BID) | ORAL | Status: DC
Start: 2015-05-06 — End: 2015-05-11
  Administered 2015-05-06 – 2015-05-10 (×10): 60 mg via ORAL
  Filled 2015-05-06 (×12): qty 1

## 2015-05-06 NOTE — Progress Notes (Signed)
Family Medicine Teaching Service Daily Progress Note Intern Pager: [phone]  Patient name: Matthew [name] Medical record number: [id number] Date of birth: [birth year] Age: 59 y.o. Gender: male  Primary Care Provider: Junie Panning, DO Consultants: Ortho Code Status: Full   Pt Overview and Major Events to Date:  8/2 - admit for cellulitis 8/3 - WBC spike and transfer to SDU 8/7 - PO abx, tx to floor  Assessment and Plan: Matthew [name] is a 59 y.o. male presenting with sepsis secondary to UTI and cellulitis . PMH is significant for afib, HTN, F5DD, alcoholic cirrhosis, chronic right pleural effusion, OSA  # Sepsis 2/2 RLE cellulitis and Enterococcus UTI with hypotension: BP improving. WBC downtrending, afebrile. Cellulitis overall improving. No evidence of osteo on MRI. - D/c vancomycin and meropenum - Transition to Doxycycline 8/7 - Ortho consulted, appreciate recs - medical management - continue to monitor BP  # AKI - Improving. Cr 1.51 (8/6) - baseline ~1. - Continue to monitor  # Hypoglycemia - Resolved, likely 2/2 sepsis and lower PO intake than at home - Bed-time snack   # Chronic right pleural effusion: known since around [facility] 2015. He has undergone multiple taps (4) showing transudative process. O2 sat 95-98% on room air. - monitor clinically  # Afib: Stable, rate controlled, currently NSR. - Continue home aspirin - Holding home diltiazem XR 180mg  2/2 soft BPs - will add back Diltiazem SR 60mg  BID  # OSA: Patient refusing CPAP - CPAP ordered  # Cirrhosis: likely alcoholic cirrhosis, underwent workup in 2015 with CT, negative HCV; had attempted MRI but pt was unable to fit in MRI machine. ALT 72   # Alcohol abuse: Stable, no signs of withdrawal, states last drink was 2 months ago - continue to monitor, not on CIWA currently  #FEN/GI: diet carb modified, SLIV Prophylaxis: heparin sq  Disposition: Transfer to telemetry 8/7, d/c pending continued clinical  improvement on PO abx.  Subjective: Patient doing well this morning. He states that leg is still a bit tender with palpation and standing., but improving Denies other complaints.  Objective: Temp:  [97.2 F (36.2 C)-98 F (36.7 C)] 97.6 F (36.4 C) (08/07 0409) Pulse Rate:  [80-88] 88 (08/06 2337) Resp:  [11-20] 19 (08/07 0409) BP: (112-173)/(46-103) 112/60 mmHg (08/07 0409) SpO2:  [90 %-95 %] 93 % (08/07 0409) Weight:  [386 lb 11 oz (175.4 kg)] 386 lb 11 oz (175.4 kg) (08/07 0409)  Physical Exam: General: obese, sitting up in chair, NAD  Cardiovascular: RRR, no m/r/g, radial pulses  Respiratory: diminished breath sounds diffusely, CTAB  Abdomen: large pannus, NTND, BS+ Extremities: RLE erythematous, TTP, warm. Marked cellulitis, improving. Both Right and Left leg with significant venous stasis.  3+ pitting edema.  Ulcer over base of 5th metatarsal stable  Laboratory:  Recent Labs Lab 05/04/15 0343 05/05/15 0300 05/06/15 0329  WBC 25.2* 19.3* 18.0*  HGB 15.0 14.9 15.3  HCT 43.0 42.0 44.1  PLT 257 283 264    Recent Labs Lab 05/01/15 1440  05/03/15 0245 05/04/15 0343 05/05/15 0300 05/06/15 0329  NA 134*  < > 130* 133* 134* 133*  K 4.2  < > 5.0 4.9 4.9 5.4*  CL 103  < > 99* 105 105 103  CO2 22  < > 21* 18* 20* 23  BUN 22*  < > 45* 43* 38* 33*  CREATININE 1.10  < > 2.17* 1.93* 1.60* 1.51*  CALCIUM 8.7*  < > 7.8* 7.8* 7.9* 8.1*  PROT 7.8  --  6.1* 6.3*  --   --   BILITOT 1.0  --  1.0 0.9  --   --   ALKPHOS 134*  --  108 112  --   --   ALT 45  --  41 37  --   --   AST 72*  --  73* 62*  --   --   GLUCOSE 93  < > 57* 77 82 77  < > = values in this interval not displayed.  Urinalysis    Component Value Date/Time   COLORURINE BROWN* 05/02/2015 2052   APPEARANCEUR TURBID* 05/02/2015 2052   LABSPEC 1.015 05/02/2015 2052   PHURINE 5.5 05/02/2015 2052   GLUCOSEU NEGATIVE 05/02/2015 2052   HGBUR LARGE* 05/02/2015 2052   BILIRUBINUR MODERATE* 05/02/2015 2052    KETONESUR 15* 05/02/2015 2052   PROTEINUR 100* 05/02/2015 2052   UROBILINOGEN 1.0 05/02/2015 2052   NITRITE NEGATIVE 05/02/2015 2052   LEUKOCYTESUR MODERATE* 05/02/2015 2052    Microbiology  Blood Cultures - 3 x day NGTD   Urine Cultures - >=100,000 COLONIES/mL ENTEROCOCCUS SPECIES - pan sensitive  Imaging/Diagnostic Tests: Dg Chest 2 View  05/01/2015   CLINICAL DATA:  New tachypneic, leukocytosis.  Fever and chills.  EXAM: CHEST  2 VIEW  COMPARISON:  03/10/2015  FINDINGS: Small right pleural effusion is unchanged. There is right lower lobe atelectasis/ infiltrate also unchanged.  Left lung remains clear  Heart size upper normal.  Mild vascular congestion without edema.  IMPRESSION: Right pleural effusion and right lower lobe atelectasis/ infiltrate unchanged.   Electronically Signed   By: Franchot Gallo M.D.   On: 05/01/2015 15:43   US Renal  05/03/2015   CLINICAL DATA:  Acute renal insufficiency  EXAM: RENAL / URINARY TRACT ULTRASOUND COMPLETE  COMPARISON:  Ultrasound 03/10/2015  FINDINGS: Right Kidney:  Length: 13.3 cm. Anechoic cyst in the upper pole measuring 6.6 cm. No hydronephrosis.  Left Kidney:  Length: 13.1 cm.  No hydronephrosis.  Bladder:  Appears normal for degree of bladder distention.  IMPRESSION: No hydronephrosis.  Benign-appearing cyst of the RIGHT kidney   Electronically Signed   By: Suzy Bouchard M.D.   On: 05/03/2015 16:30   Mr Foot Right W Wo Contrast  05/05/2015   CLINICAL DATA:  Lateral foot ulcers and severe foot swelling.  EXAM: MRI OF THE RIGHT FOREFOOT WITHOUT AND WITH CONTRAST  TECHNIQUE: Multiplanar, multisequence MR imaging was performed both before and after administration of intravenous contrast.  CONTRAST:  27mL MULTIHANCE GADOBENATE DIMEGLUMINE 529 MG/ML IV SOLN  COMPARISON:  None.  FINDINGS: There is marked diffuse the subcutaneous soft tissue swelling/ edema/fluid most likely reflecting severe cellulitis. No focal drainable soft tissue abscess, osteomyelitis  or septic arthritis. There is mild diffuse myositis but no findings for pyomyositis.  There are degenerative changes involving the forefoot, midfoot and hindfoot/ ankle. The major tendons and ligaments appear intact.  IMPRESSION: Severe cellulitis and diffuse myositis but no discrete drainable soft tissue abscess.  No definite MR findings for septic arthritis or osteomyelitis.   Electronically Signed   By: Marijo Sanes M.D.   On: 05/05/2015 13:09    Virginia Crews, MD, MPH 05/06/2015, 7:20 AM PGY-2, [place] Intern pager: [phone], text pages welcome

## 2015-05-06 NOTE — Progress Notes (Signed)
Orthopedics Progress Note  Subjective: No change in leg pain  Objective:  Filed Vitals:   05/06/15 1215  BP: 121/60  Pulse: 76  Temp: 98.7 F (37.1 C)  Resp:     General: Awake and alert  Musculoskeletal: right foot remains moderately swollen, but no fluctuance and no drainage. Absolutely pain free DF and PF of the ankle.  No change in the whole leg swelling and redness. Neurovascularly intact  Lab Results  Component Value Date   WBC 18.0* 05/06/2015   HGB 15.3 05/06/2015   HCT 44.1 05/06/2015   MCV 95.7 05/06/2015   PLT 264 05/06/2015       Component Value Date/Time   NA 133* 05/06/2015 0329   K 5.4* 05/06/2015 0329   CL 103 05/06/2015 0329   CO2 23 05/06/2015 0329   GLUCOSE 77 05/06/2015 0329   BUN 33* 05/06/2015 0329   CREATININE 1.51* 05/06/2015 0329   CREATININE 0.75 01/25/2014 1655   CALCIUM 8.1* 05/06/2015 0329   GFRNONAA 50* 05/06/2015 0329   GFRAA 57* 05/06/2015 0329    Lab Results  Component Value Date   INR 1.43 03/10/2015   INR 1.42 02/15/2015   INR 1.56* 01/30/2015    Assessment/Plan: Patient with right leg cellulitis and no evidence of deep abscess.  Understand the plan is to change over to oral antibiotics prior to discharge.  No need for surgical intervention.  Will sign off.  Reconsult if there are any acute ortho issues. Thanks   Doran Heater. Veverly Fells, MD 05/06/2015 1:47 PM

## 2015-05-06 NOTE — Progress Notes (Signed)
05/06/2015 6:48 PM Pt. Assisted back to bed from chair. Pt. On air overlay mattress. Pt. Refusing all four side rails up at this time. Stating he cannot let his "arms stretch out" and "get comfortable" with all four side rails up. Pt. Instructed that this is against hospital policy to have less than all four rails up with an air overlay mattress and education provided to patient regarding patient safety precautions already in place. Pt. Still refusing to have all four side-rails up and agrees to have only three up. Will continue to closely monitor patient.  Flippin, Arville Lime

## 2015-05-06 NOTE — Progress Notes (Signed)
Transferred to 2 westroom 9 by bariatric bed, stable, belongings with pt. Report given to RN.

## 2015-05-06 NOTE — Progress Notes (Signed)
Hypoglycemic Event  CBG: 61  Treatment: 4 oz soda (per patient request) and dinner tray  Symptoms: "feeling bad"  Follow-up CBG: Time: 1655 CBG Result: 75  Possible Reasons for Event:  Poor PO intake  Comments/MD notified:    Matthew [name], Matthew [name]  Remember to initiate Hypoglycemia Order Set & complete

## 2015-05-07 LAB — GLUCOSE, CAPILLARY
GLUCOSE-CAPILLARY: 70 mg/dL (ref 65–99)
GLUCOSE-CAPILLARY: 74 mg/dL (ref 65–99)
GLUCOSE-CAPILLARY: 71 mg/dL (ref 65–99)
Glucose-Capillary: 82 mg/dL (ref 65–99)
Glucose-Capillary: 77 mg/dL (ref 65–99)

## 2015-05-07 LAB — BASIC METABOLIC PANEL
Anion gap: 10 (ref 5–15)
BUN: 33 mg/dL — AB (ref 6–20)
CALCIUM: 8.2 mg/dL — AB (ref 8.9–10.3)
CHLORIDE: 98 mmol/L — AB (ref 101–111)
CO2: 22 mmol/L (ref 22–32)
Creatinine, Ser: 1.43 mg/dL — ABNORMAL HIGH (ref 0.61–1.24)
GFR calc non Af Amer: 53 mL/min — ABNORMAL LOW (ref >60)
Glucose, Bld: 82 mg/dL (ref 65–99)
Potassium: 5.4 mmol/L — ABNORMAL HIGH (ref 3.5–5.1)
Sodium: 130 mmol/L — ABNORMAL LOW (ref 135–145)

## 2015-05-07 LAB — CBC
HCT: 43.6 % (ref 39.0–52.0)
HEMOGLOBIN: 15.2 g/dL (ref 13.0–17.0)
MCH: 33.1 pg (ref 26.0–34.0)
MCHC: 34.9 g/dL (ref 30.0–36.0)
MCV: 95 fL (ref 78.0–100.0)
Platelets: 272 10*3/uL (ref 150–400)
RBC: 4.59 MIL/uL (ref 4.22–5.81)
RDW: 16.5 % — ABNORMAL HIGH (ref 11.5–15.5)
WBC: 15 10*3/uL — ABNORMAL HIGH (ref 4.0–10.5)

## 2015-05-07 MED ORDER — VANCOMYCIN HCL 10 G IV SOLR
1250.0000 mg | Freq: Two times a day (BID) | INTRAVENOUS | Status: DC
  Administered 2015-05-08: 1250 mg via INTRAVENOUS
  Filled 2015-05-07 (×2): qty 1250

## 2015-05-07 MED ORDER — VANCOMYCIN HCL 10 G IV SOLR
2500.0000 mg | Freq: Once | INTRAVENOUS | Status: AC
  Administered 2015-05-07: 2500 mg via INTRAVENOUS
  Filled 2015-05-07: qty 2500

## 2015-05-07 NOTE — Progress Notes (Signed)
Pt request to have side rails down while on bariatric bed, pt educated on safety and need for all 4 bed rails to be up, pt refused to stay in bed and request to sleep in chair for the night, pt reports that he will not call for help and will go to the bathroom without assistance, pt educated on safety again and hospital policies and procedure, pt notified that a chair alarm will be placed in the chair, pt reports that the chair alarm will not stop him, pt placed in chair, chair alarm in use, pt call bell within reach, RN will continue to monitor and reiterate pt safety.   Fulton Mole, [place] 05/07/2015

## 2015-05-07 NOTE — Progress Notes (Signed)
Hypoglycemic Event  CBG: 70  Treatment: saltines and regular ginger ale (pt originally refused to eat or drink anything, pt educated)  Symptoms: pt asymptomatic  Follow-up CBG: [id number] CBG Result:82  Possible Reasons for Event: decreased oral intake during the night      [name],Twylia Oka A  05/07/2015   Remember to initiate Hypoglycemia Order Set & complete

## 2015-05-07 NOTE — Care Management (Signed)
CM Consult for medication needs; Pt has active medicaid, prescription cost will be approximately $3.

## 2015-05-07 NOTE — Progress Notes (Signed)
Pt now refuses to wear CPAP due to all of the restrictions and policies for pt safety, pt call bell within reach, chair alarm in use, RN will continue to monitor.  Matthew [name] 05/07/2015

## 2015-05-07 NOTE — Progress Notes (Signed)
Family Medicine Teaching Service Daily Progress Note Intern Pager: [phone]  Patient name: Matthew [name] Medical record number: [id number] Date of birth: [birth year] Age: 59 y.o. Gender: male  Primary Care Provider: Junie Panning, DO Consultants: Ortho Code Status: Full   Pt Overview and Major Events to Date:  8/2 - admit for cellulitis 8/3 - WBC spike and transfer to SDU 8/7 - PO abx, tx to floor   Assessment and Plan: Matthew [name] is a 59 y.o. male presenting with sepsis secondary to UTI and cellulitis . PMH is significant for afib, HTN, H8IF, alcoholic cirrhosis, chronic right pleural effusion, OSA  # Sepsis 2/2 RLE cellulitis and Enterococcus UTI: BP improving. WBC downtrending, afebrile. Cellulitis overall improving. No evidence of osteo on MRI. Ortho indicates medical management. Enterococcus UTI adequately treated. Had Vancomycin 5/5 days and Meropenem 5/5 days  - PO 100 mg Doxycycline BID 8/7 - continue to monitor BP - WBC 15.0  # AKI - Improving. Cr 1.43- baseline ~1. - Continue to monitor  # Hypoglycemia - Resolved.  - Bed-time snack   # Chronic right pleural effusion: known since around [facility] 2015. He has undergone multiple taps (4) showing transudative process. O2 sat 95-98% on room air. - monitor clinically  # Afib: Stable, rate controlled, currently NSR. - Continue home aspirin - Continue Diltiazem SR 60mg  BID  # OSA: Patient refused this AM  - CPAP ordered  # Cirrhosis: likely alcoholic cirrhosis, underwent workup in 2015 with CT, negative HCV; had attempted MRI but pt was unable to fit in MRI machine. ALT 72   # Alcohol abuse: Stable, no signs of withdrawal, states last drink was 2 months ago - continue to monitor, not on CIWA currently  #FEN/GI: diet carb modified, SLIV Prophylaxis: heparin sq  Disposition: Transfer to telemetry 8/7,dispo to home   Subjective: Patient tired this AM. He states that he did not sleep well. His RLE is still  tender to palpation.   Objective: Temp:  [97.4 F (36.3 C)-98.7 F (37.1 C)] 97.4 F (36.3 C) (08/08 0523) Pulse Rate:  [76-100] 89 (08/08 0523) Resp:  [18] 18 (08/08 0523) BP: (120-132)/(60-78) 123/68 mmHg (08/08 0523) SpO2:  [92 %-98 %] 92 % (08/08 0523)  Physical Exam: General: obese, sitting up in chair, NAD  Cardiovascular: RRR, no m/r/g, radial pulses 2+ Respiratory: diminished breath sounds diffusely, CTAB  Abdomen: large pannus, NTND, BS+ Extremities: RLE erythematous, slightly outside of marked lines, warm, tenderness to the palpation. Both Right and Left leg with significant venous stasis.  3+ pitting edema.  Ulcer over base of 5th metatarsal stable  Laboratory:  Recent Labs Lab 05/05/15 0300 05/06/15 0329 05/07/15 0332  WBC 19.3* 18.0* 15.0*  HGB 14.9 15.3 15.2  HCT 42.0 44.1 43.6  PLT 283 264 272    Recent Labs Lab 05/01/15 1440  05/03/15 0245 05/04/15 0343 05/05/15 0300 05/06/15 0329 05/07/15 0332  NA 134*  < > 130* 133* 134* 133* 130*  K 4.2  < > 5.0 4.9 4.9 5.4* 5.4*  CL 103  < > 99* 105 105 103 98*  CO2 22  < > 21* 18* 20* 23 22  BUN 22*  < > 45* 43* 38* 33* 33*  CREATININE 1.10  < > 2.17* 1.93* 1.60* 1.51* 1.43*  CALCIUM 8.7*  < > 7.8* 7.8* 7.9* 8.1* 8.2*  PROT 7.8  --  6.1* 6.3*  --   --   --   BILITOT 1.0  --  1.0 0.9  --   --   --  ALKPHOS 134*  --  108 112  --   --   --   ALT 45  --  41 37  --   --   --   AST 72*  --  73* 62*  --   --   --   GLUCOSE 93  < > 57* 77 82 77 82  < > = values in this interval not displayed.  Urinalysis    Component Value Date/Time   COLORURINE BROWN* 05/02/2015 2052   APPEARANCEUR TURBID* 05/02/2015 2052   LABSPEC 1.015 05/02/2015 2052   PHURINE 5.5 05/02/2015 2052   GLUCOSEU NEGATIVE 05/02/2015 2052   HGBUR LARGE* 05/02/2015 2052   BILIRUBINUR MODERATE* 05/02/2015 2052   KETONESUR 15* 05/02/2015 2052   PROTEINUR 100* 05/02/2015 2052   UROBILINOGEN 1.0 05/02/2015 2052   NITRITE NEGATIVE 05/02/2015  2052   LEUKOCYTESUR MODERATE* 05/02/2015 2052    Microbiology  Blood Cultures - 3 x day NGTD   Urine Cultures - >=100,000 COLONIES/mL ENTEROCOCCUS SPECIES - pan sensitive  Imaging/Diagnostic Tests: Dg Chest 2 View  05/01/2015   CLINICAL DATA:  New tachypneic, leukocytosis.  Fever and chills.  EXAM: CHEST  2 VIEW  COMPARISON:  03/10/2015  FINDINGS: Small right pleural effusion is unchanged. There is right lower lobe atelectasis/ infiltrate also unchanged.  Left lung remains clear  Heart size upper normal.  Mild vascular congestion without edema.  IMPRESSION: Right pleural effusion and right lower lobe atelectasis/ infiltrate unchanged.   Electronically Signed   By: Franchot Gallo M.D.   On: 05/01/2015 15:43   US Renal  05/03/2015   CLINICAL DATA:  Acute renal insufficiency  EXAM: RENAL / URINARY TRACT ULTRASOUND COMPLETE  COMPARISON:  Ultrasound 03/10/2015  FINDINGS: Right Kidney:  Length: 13.3 cm. Anechoic cyst in the upper pole measuring 6.6 cm. No hydronephrosis.  Left Kidney:  Length: 13.1 cm.  No hydronephrosis.  Bladder:  Appears normal for degree of bladder distention.  IMPRESSION: No hydronephrosis.  Benign-appearing cyst of the RIGHT kidney   Electronically Signed   By: Suzy Bouchard M.D.   On: 05/03/2015 16:30   Mr Foot Right W Wo Contrast  05/05/2015   CLINICAL DATA:  Lateral foot ulcers and severe foot swelling.  EXAM: MRI OF THE RIGHT FOREFOOT WITHOUT AND WITH CONTRAST  TECHNIQUE: Multiplanar, multisequence MR imaging was performed both before and after administration of intravenous contrast.  CONTRAST:  2mL MULTIHANCE GADOBENATE DIMEGLUMINE 529 MG/ML IV SOLN  COMPARISON:  None.  FINDINGS: There is marked diffuse the subcutaneous soft tissue swelling/ edema/fluid most likely reflecting severe cellulitis. No focal drainable soft tissue abscess, osteomyelitis or septic arthritis. There is mild diffuse myositis but no findings for pyomyositis.  There are degenerative changes involving the  forefoot, midfoot and hindfoot/ ankle. The major tendons and ligaments appear intact.  IMPRESSION: Severe cellulitis and diffuse myositis but no discrete drainable soft tissue abscess.  No definite MR findings for septic arthritis or osteomyelitis.   Electronically Signed   By: Marijo Sanes M.D.   On: 05/05/2015 13:09    Lamoyne Palencia Cletis Media, MD, MPH 05/07/2015, 7:04 AM PGY-1, [place] Intern pager: [phone], text pages welcome

## 2015-05-07 NOTE — Progress Notes (Signed)
ANTIBIOTIC CONSULT NOTE - INITIAL  Pharmacy Consult for Vancomycin Indication: cellulitis  Allergies  Allergen Reactions  . Potassium-Containing Compounds Other (See Comments)    Stomach Pain  . Augmentin [Amoxicillin-Pot Clavulanate] Nausea Only    Most of the time it is okay, but has had some bad experiences with it.  [name] Neomycin Rash    Patient Measurements: Height: 5\' 10"  (177.8 cm) Weight: (!) 386 lb 11 oz (175.4 kg) IBW/kg (Calculated) : 73 Adjusted Body Weight:   Vital Signs: Temp: 98 F (36.7 C) (08/08 1404) Temp Source: Oral (08/08 1404) BP: 121/69 mmHg (08/08 1404) Pulse Rate: 83 (08/08 1404) Intake/Output from previous day: 08/07 0701 - 08/08 0700 In: 440 [P.O.:440] Out: 800 [Urine:800] Intake/Output from this shift: Total I/O In: 180 [P.O.:180] Out: -   Labs:  Recent Labs  05/05/15 0300 05/06/15 0329 05/07/15 0332  WBC 19.3* 18.0* 15.0*  HGB 14.9 15.3 15.2  PLT 283 264 272  CREATININE 1.60* 1.51* 1.43*   Estimated Creatinine Clearance: 91.9 mL/min (by C-G formula based on Cr of 1.43).  Recent Labs  05/04/15 1901  [facility]     Microbiology: Recent Results (from the past 720 hour(s))  Blood culture (routine x 2)     Status: None   Collection Time: 05/01/15  1:50 PM  Result Value Ref Range Status   Specimen Description BLOOD RIGHT ARM  Final   Special Requests BOTTLES DRAWN AEROBIC AND ANAEROBIC 5CC  Final   Culture   Final    NO GROWTH 5 DAYS Performed at [facility]    Report Status 05/06/2015 FINAL  Final  Blood culture (routine x 2)     Status: None   Collection Time: 05/01/15  1:50 PM  Result Value Ref Range Status   Specimen Description BLOOD LEFT HAND  Final   Special Requests BOTTLES DRAWN AEROBIC AND ANAEROBIC 5CC  Final   Culture   Final    NO GROWTH 5 DAYS Performed at [facility]    Report Status 05/06/2015 FINAL  Final  Culture, Urine     Status: None   Collection Time: 05/01/15  2:23 PM  Result  Value Ref Range Status   Specimen Description URINE, RANDOM  Final   Special Requests NONE  Final   Culture   Final    >=100,000 COLONIES/mL ENTEROCOCCUS SPECIES Performed at [facility]    Report Status 05/04/2015 FINAL  Final   Organism ID, Bacteria ENTEROCOCCUS SPECIES  Final      Susceptibility   Enterococcus species - MIC*    AMPICILLIN <=2 SENSITIVE Sensitive     LEVOFLOXACIN 1 SENSITIVE Sensitive     NITROFURANTOIN <=16 SENSITIVE Sensitive     VANCOMYCIN 1 SENSITIVE Sensitive     LINEZOLID 2 SENSITIVE Sensitive     * >=100,000 COLONIES/mL ENTEROCOCCUS SPECIES  MRSA PCR Screening     Status: None   Collection Time: 05/02/15 12:18 PM  Result Value Ref Range Status   MRSA by PCR NEGATIVE NEGATIVE Final    Comment:        The GeneXpert MRSA Assay (FDA approved for NASAL specimens only), is one component of a comprehensive MRSA colonization surveillance program. It is not intended to diagnose MRSA infection nor to guide or monitor treatment for MRSA infections.     Medical History: Past Medical History  Diagnosis Date  . Atrial fibrillation, chronic   . Hypertension   . GERD (gastroesophageal reflux disease)   . Septic shock   .  CHF (congestive heart failure)   . Shortness of breath dyspnea     Medications:  Prescriptions prior to admission  Medication Sig Dispense Refill Last Dose  . aspirin 81 MG EC tablet Take 1 tablet (81 mg total) by mouth daily. 30 tablet 0 05/01/2015 at Unknown time  . diltiazem (CARDIZEM CD) 180 MG 24 hr capsule TAKE 1 CAPSULE (180 MG TOTAL) BY MOUTH DAILY. 30 capsule 0 05/01/2015 at Unknown time  . folic acid (FOLVITE) 1 MG tablet Take 1 tablet (1 mg total) by mouth daily.   05/01/2015 at Unknown time  . furosemide (LASIX) 40 MG tablet Take 1 tablet (40 mg total) by mouth daily. 30 tablet 0 05/01/2015 at Unknown time  . ketoconazole (NIZORAL) 2 % cream Apply 1 application topically daily as needed for irritation.   Past Month at Unknown  time  . Multiple Vitamins-Minerals (CENTRUM SILVER ADULT 50+) TABS Take 125 mg by mouth daily.   05/01/2015 at Unknown time  . spironolactone (ALDACTONE) 50 MG tablet Take 2 tablets (100 mg total) by mouth daily. 60 tablet 0 05/01/2015 at Unknown time  . thiamine 100 MG tablet Take 1 tablet (100 mg total) by mouth daily. 30 tablet 0 05/01/2015 at Unknown time   Scheduled:  . aspirin EC  81 mg Oral Daily  . calcium carbonate  1 tablet Oral TID WC  . diltiazem  60 mg Oral Q12H  . folic acid  1 mg Oral Daily  . heparin  5,000 Units Subcutaneous 3 times per day  . pantoprazole  20 mg Oral Daily  . polyethylene glycol  17 g Oral Daily  . sodium chloride  3 mL Intravenous Q12H  . thiamine  100 mg Oral Daily   Infusions:   Assessment: 59yo male with cellulitis just switched to PO doxycycline on 8/7 presenting with increased redness , edema, nausea and confusion. Pharmacy is consulted to dose vancomycin for cellulitis. Pt is afebrile, WBC 15.0, sCr 1.4.  Goal of Therapy:  Vancomycin trough level 10-15 mcg/ml  Plan:  Vancomycin 2500mg  IV load followed by 1250mg  q12h Measure antibiotic drug levels at steady state Follow up culture results, renal function and clinical course  Andrey Cota. Diona Foley, PharmD Clinical Pharmacist Pager [phone] 05/07/2015,4:56 PM

## 2015-05-08 DIAGNOSIS — Z590 Homelessness: Secondary | ICD-10-CM

## 2015-05-08 DIAGNOSIS — R4182 Altered mental status, unspecified: Secondary | ICD-10-CM

## 2015-05-08 DIAGNOSIS — B952 Enterococcus as the cause of diseases classified elsewhere: Secondary | ICD-10-CM

## 2015-05-08 DIAGNOSIS — I4891 Unspecified atrial fibrillation: Secondary | ICD-10-CM

## 2015-05-08 DIAGNOSIS — R251 Tremor, unspecified: Secondary | ICD-10-CM

## 2015-05-08 DIAGNOSIS — L539 Erythematous condition, unspecified: Secondary | ICD-10-CM

## 2015-05-08 DIAGNOSIS — F1021 Alcohol dependence, in remission: Secondary | ICD-10-CM

## 2015-05-08 DIAGNOSIS — K7030 Alcoholic cirrhosis of liver without ascites: Secondary | ICD-10-CM

## 2015-05-08 DIAGNOSIS — L97529 Non-pressure chronic ulcer of other part of left foot with unspecified severity: Secondary | ICD-10-CM

## 2015-05-08 DIAGNOSIS — R8290 Unspecified abnormal findings in urine: Secondary | ICD-10-CM

## 2015-05-08 DIAGNOSIS — J90 Pleural effusion, not elsewhere classified: Secondary | ICD-10-CM

## 2015-05-08 DIAGNOSIS — E11621 Type 2 diabetes mellitus with foot ulcer: Secondary | ICD-10-CM | POA: Insufficient documentation

## 2015-05-08 DIAGNOSIS — G9340 Encephalopathy, unspecified: Secondary | ICD-10-CM | POA: Insufficient documentation

## 2015-05-08 DIAGNOSIS — L97519 Non-pressure chronic ulcer of other part of right foot with unspecified severity: Secondary | ICD-10-CM

## 2015-05-08 LAB — GLUCOSE, CAPILLARY
GLUCOSE-CAPILLARY: 87 mg/dL (ref 65–99)
Glucose-Capillary: 77 mg/dL (ref 65–99)
Glucose-Capillary: 97 mg/dL (ref 65–99)
Glucose-Capillary: 81 mg/dL (ref 65–99)

## 2015-05-08 LAB — AMMONIA: Ammonia: 90 umol/L — ABNORMAL HIGH (ref 9–35)

## 2015-05-08 LAB — BLOOD GAS, ARTERIAL
Acid-base deficit: 0.6 mmol/L (ref 0.0–2.0)
Bicarbonate: 23.3 mEq/L (ref 20.0–24.0)
DRAWN BY: 441371
FIO2: 0.21
O2 SAT: 95.3 %
Patient temperature: 98.6
TCO2: 24.4 mmol/L (ref 0–100)
pCO2 arterial: 36.7 mmHg (ref 35.0–45.0)
pH, Arterial: 7.419 (ref 7.350–7.450)
pO2, Arterial: 80.5 mmHg (ref 80.0–100.0)

## 2015-05-08 LAB — BASIC METABOLIC PANEL
Anion gap: 11 (ref 5–15)
BUN: 40 mg/dL — AB (ref 6–20)
CALCIUM: 8.2 mg/dL — AB (ref 8.9–10.3)
CHLORIDE: 101 mmol/L (ref 101–111)
CO2: 21 mmol/L — AB (ref 22–32)
Creatinine, Ser: 1.77 mg/dL — ABNORMAL HIGH (ref 0.61–1.24)
GFR calc Af Amer: 47 mL/min — ABNORMAL LOW (ref >60)
GFR calc non Af Amer: 41 mL/min — ABNORMAL LOW (ref >60)
Glucose, Bld: 89 mg/dL (ref 65–99)
Potassium: 5.4 mmol/L — ABNORMAL HIGH (ref 3.5–5.1)
SODIUM: 133 mmol/L — AB (ref 135–145)

## 2015-05-08 LAB — CBC
HCT: 41.8 % (ref 39.0–52.0)
Hemoglobin: 15 g/dL (ref 13.0–17.0)
MCH: 33.3 pg (ref 26.0–34.0)
MCHC: 35.9 g/dL (ref 30.0–36.0)
MCV: 92.9 fL (ref 78.0–100.0)
Platelets: 358 10*3/uL (ref 150–400)
RBC: 4.5 MIL/uL (ref 4.22–5.81)
RDW: 16.2 % — ABNORMAL HIGH (ref 11.5–15.5)
WBC: 14.3 10*3/uL — ABNORMAL HIGH (ref 4.0–10.5)

## 2015-05-08 MED ORDER — SODIUM CHLORIDE 0.9 % IV SOLN
INTRAVENOUS | Status: DC
  Administered 2015-05-08 – 2015-05-09 (×2): via INTRAVENOUS

## 2015-05-08 MED ORDER — VANCOMYCIN HCL 10 G IV SOLR
1750.0000 mg | INTRAVENOUS | Status: DC
  Filled 2015-05-08: qty 1750

## 2015-05-08 MED ORDER — SODIUM CHLORIDE 0.9 % IV SOLN: 250.0000 mL | INTRAVENOUS | Status: DC | PRN

## 2015-05-08 MED ORDER — SODIUM CHLORIDE 0.9 % IV SOLN
3.0000 g | Freq: Four times a day (QID) | INTRAVENOUS | Status: DC
  Administered 2015-05-08 – 2015-05-10 (×7): 3 g via INTRAVENOUS
  Filled 2015-05-08 (×9): qty 3

## 2015-05-08 MED ORDER — LACTULOSE 10 GM/15ML PO SOLN
30.0000 g | Freq: Three times a day (TID) | ORAL | Status: DC
  Administered 2015-05-08 – 2015-05-11 (×8): 30 g via ORAL
  Filled 2015-05-08 (×10): qty 45

## 2015-05-08 NOTE — Progress Notes (Signed)
RT entered room to place patient on CPAP and patient stated he has not been wearing his CPAP at night and does not want to wear it tonight either. Rt informed patient if he changes his mind have RN contact RT.

## 2015-05-08 NOTE — Consult Note (Signed)
WOC wound consult note Reason for Consult:neuropathic foot ulceration, right LE fluid-filled blisters. Clinical CEAP 4 vs Clinical CEAP 6. Wound type:neuropathic, infectious vs. venous insufficiency v. lymphedema Pressure Ulcer POA: No Measurement: Right lateral midfoot ulcer:  2cm x 1.5cm with no appreciable depth; darker skin is presenting as overgrowth (hyperkeratosis), callous or soft eschar. Serum filled blisters on right medial LE occuply an area measuring 4cm x 4cm and on the lateral LE measuring 6cm x 4cm. Wound bed: Drainage (amount, consistency, odor)  Periwound: Right LE is erythematous and edematous, >left LE.  Left LE with hemosiderin staining consistent with venous insufficiency.  Dressing procedure/placement/frequency: Patient requires attention to toenails, please refer to podiatry upon discharge if you agree. Topical care to serum filled blisters as well as the hyperkeratotic are on the right lateral midfoot will be twice daily and PRN application of xeroform gauze. Not only will this provide some mild antimicrobial effect, the bismuth will act as an astringent and support drying of the over-moist tissues.  Antibiotic coverage is ordered/overseen by ID. Orders for elevation and for pressure redistribution boots to both elevate heels and support correction of the lateral rotation of the feet is provided today by this Probation officer. Thank you for this consultation. The [place] nursing team will not follow, but will remain available to this patient, the nursing and medical teams.  Please re-consult if needed. Thanks, Maudie Flakes, MSN, RN, [place], [place], [place] [phone])

## 2015-05-08 NOTE — Evaluation (Signed)
Physical Therapy Evaluation Patient Details Name: Matthew [name] MRN: [id number] DOB: 1956-02-13 Today's Date: 05/08/2015   History of Present Illness  59 y.o. male admitted to [facility] on 05/01/15 for sepsis due to UTI and cellulitis.  Also, of note to have AMS, AKI, and chrrhosis (long standing).  Pt with significant PMHx of A-fib, HTN, CHF, SOB, knee arthroscopy (unknown side- not indicated in chart).    Clinical Impression  Pt with significant cognitive deficits and weakness when up on his feet.  He is a very high fall risk at this time.  I recommend he use a RW full time and he will need f/u therapy at discharge.  At this point SNF level rehab recommended.   PT to follow acutely for deficits listed below.       Follow Up Recommendations SNF    Equipment Recommendations  Rolling walker with 5" wheels;Other (comment) (bariatric RW)    Recommendations for Other Services   NA    Precautions / Restrictions Precautions Precautions: Fall      Mobility     Transfers Overall transfer level: Needs assistance Equipment used: None Transfers: Sit to/from Stand Sit to Stand: Min assist         General transfer comment: Min assist to support trunk for balance during transitions.   Ambulation/Gait Ambulation/Gait assistance: Min assist Ambulation Distance (Feet): 55 Feet Assistive device: 1 person hand held assist (and IV pole) Gait Pattern/deviations: Step-through pattern;Wide base of support;Staggering left;Staggering right Gait velocity: decreased Gait velocity interpretation: Below normal speed for age/gender General Gait Details: pt with slow, staggering gait speed.  He would benefit from RW use next session.          Balance Overall balance assessment: Needs assistance         Standing balance support: Single extremity supported;Bilateral upper extremity supported Standing balance-Leahy Scale: Poor                               Pertinent Vitals/Pain  Pain Assessment: No/denies pain    Home Living Family/patient expects to be discharged to:: Shelter/Homeless               Home Equipment: None Additional Comments: In a transitional care home, he could not relay much to me about the layout.     Prior Function Level of Independence: Independent         Comments: per pt he does not use an assistive device.         Extremity/Trunk Assessment   Upper Extremity Assessment: Defer to OT evaluation           Lower Extremity Assessment: Generalized weakness      Cervical / Trunk Assessment: Normal  Communication   Communication: No difficulties  Cognition Arousal/Alertness: Lethargic Behavior During Therapy: Agitated;Anxious (paranoid) Overall Cognitive Status: Impaired/Different from baseline Area of Impairment: Safety/judgement;Awareness;Problem solving     Memory: Decreased short-term memory   Safety/Judgement: Decreased awareness of deficits;Decreased awareness of safety Awareness: Intellectual Problem Solving: Slow processing;Decreased initiation;Difficulty sequencing;Requires verbal cues;Requires tactile cues General Comments: Pt is aware that he is confused, has significant issues problem solvong as he was walking backwards after getting out of the bathroom all the way over to the chair despite high risk of falling walking backwards.     General Comments General comments (skin integrity, edema, etc.): Pt very confused, having problems problem solving even basic mobility tasks.  He had a significant incontinent episode  in the bathroom and urinated all over the floor.  He kept saying, "what is wrong with me?"          Assessment/Plan    PT Assessment Patient needs continued PT services  PT Diagnosis Difficulty walking;Abnormality of gait;Generalized weakness;Altered mental status   PT Problem List Decreased strength;Decreased activity tolerance;Decreased balance;Decreased mobility;Decreased  cognition;Decreased knowledge of use of DME;Decreased coordination;Decreased safety awareness;Decreased knowledge of precautions;Obesity  PT Treatment Interventions DME instruction;Stair training;Gait training;Functional mobility training;Therapeutic activities;Therapeutic exercise;Balance training;Neuromuscular re-education;Cognitive remediation;Patient/family education   PT Goals (Current goals can be found in the Care Plan section) Acute Rehab PT Goals Patient Stated Goal: unable to state PT Goal Formulation: Patient unable to participate in goal setting Time For Goal Achievement: 05/22/15 Potential to Achieve Goals: Good    Frequency Min 2X/week   Barriers to discharge Decreased caregiver support pt is in a transitional care home.  Per RN CM notes he needs to essentially be independent to return there.        End of Session   Activity Tolerance: Patient limited by fatigue;Patient limited by lethargy Patient left: in chair;with call bell/phone within reach;with chair alarm set           Time: 3094-0768 PT Time Calculation (min) (ACUTE ONLY): 28 min   Charges:   PT Evaluation $Initial PT Evaluation Tier I: 1 Procedure PT Treatments $Gait Training: 8-22 mins        Truxton Stupka B. [place], [place], DPT [phone]   05/08/2015, 4:39 PM

## 2015-05-08 NOTE — Progress Notes (Signed)
FPTS Interim Progress Note  S: Went to check on patient for evening evaluation approx 2130. Patient last evaluated by FPTS this afternoon approx 1600 with persistent confusion and AMS. Additionally evaluated by ID at similar time with same concerns. ABG and Ammonia were checked this afternoon. Discussed care with his nurse, reports mental status is similar to previous. Transfer orders were placed this morning to transfer to SDU, pending bed availability still.  Patient sitting up in chair at bedside. He has no active complaints at the moment. Makes several requests for his room phone and call button, distracted by these items as we talked. He states that he does feel better from yesterday, but describes he was sick initially and able to verbalize that his leg seems about the same. Stated he has not gotten out of chair much. - Admits decreased appetite, but drinking well - Denies any active pain. Nausea / vomiting, abdominal pain, HA, CP, SOB  O: BP 149/79 mmHg  Pulse 87  Temp(Src) 98 F (36.7 C) (Oral)  Resp 18  Ht 5\' 10"  (1.778 m)  Wt 393 lb 8.3 oz (178.5 kg)  BMI 56.46 kg/m2  SpO2 96%   Gen - sitting in chair at bedside, appears comfortable, mostly cooperative, NAD Abd - morbidly obese, chronic pannus, non-tender Ext - RLE essentially unchanged from earlier (compared to pictures in chart) with significant edema and diffuse erythema, dressing to be applied by RN per [place] recommendations Neuro - awake, somewhat distracted but conversant slightly tangenital, oriented x2 (name, [place] to year 1957 (DOB) and time/month), good eye contact. Intact muscle str grip 5/5. Extended arms with some slight left hand twitching/flapping on exam. Intact distal to light touch. Gait not tested  A/P: Briefly, Ascher Schroepfer is a 59 year old M with complicated extensive PMH notable for chronic alcohol cirrhosis, HTN, D2M OSA. Admitted for extensive RLE cellulitis with sepsis,  additionally enterococcus UTI.  # RLE Cellulitis (in setting of DM2 foot ulcer) and Enterococcus UTI with Sepsis - Multiple antibiotic courses, now seems gradual improvement, still impressive erythema on RLE - Vitals improved now with stable BP 140/60s, without hypotension - ID consulted today with prior failure of abx. Transitioned to Unasyn today (discontinued Vanc 8/9, thought to be less likely MRSA). Additionally Enterococcus UTI covered with prior abx vanc, meropenem and now Unasyn) - Additionally ID ordered further eval of lower ext with ABI  # Acute Encephalopathy, in setting of acute infection (cellulitis / UTI),  with known chronic insult with alcohol cirrhosis - Remains clinically altered mental status, mainly with confusion / disorientation, poor ability to focus, worsening from baseline (from previous hospital encounters within past 6 mo), seems consistent with likely delirium (hospitalized, poor sleep in hospital / off CPAP, sepsis) vs hepatic encephalopathy (now confirmed elevated Ammonia 90, no prior comparison). Also checked ABG earlier given concern for hypercarbia with refusal of CPAP for OSA, however pt not COPD, ABG with pH 7.4, CO2 30s, otherwise oxygenating normal. - Transfer to SDU pending bed availability, discussed with charge nurse - Continue antibiotics for infection per ID - ID additionally added HIV, evaluation with confusion - Start Lactulose 30g PO TID, first dose tonight for coverage of potential hepatic encephalopathy - Refused CPAP again tonight. Concern for worsening delirium if poor sleep. - Close monitoring mental status  # HTN, with previous hypotension - BP improved now 140s/60s - Poor PO intake, continue IVF - Consider resuming Arlyce Harman, Lasix tomorrow if BPs improved  Olin Hauser, DO  05/08/2015, 9:44 PM PGY-3, [place] [facility] Service pager [phone]

## 2015-05-08 NOTE — Progress Notes (Signed)
Family Medicine Teaching Service Daily Progress Note Intern Pager: [phone]  Patient name: Matthew [name] Medical record number: [id number] Date of birth: 1956-01-01 Age: 59 y.o. Gender: male  Primary Care Provider: Junie Panning, DO Consultants: None  Code Status: Full   Pt Overview and Major Events to Date:  8/2 - admit for cellulitis 8/3 - WBC spike and transfer to SDU 8/7 - PO abx, tx to floor 8/9 -  Failed PO, continue vancomycin   Assessment and Plan: Matthew [name] is a 59 y.o. male presenting with sepsis secondary to UTI and cellulitis . PMH is significant for afib, HTN, M3NT, alcoholic cirrhosis, chronic right pleural effusion, OSA  # Sepsis 2/2 RLE cellulitis and Enterococcus UTI: BP improving. WBC downtrending, afebrile. Patient unable to keep doxycycline down, RLE still very erythematous.  No evidence of osteo on MRI , imaging showing severe cellulitis and diffuse myositis but no discrete drainable soft tissue abscess. Ortho indicates medical management. Enterococcus UTI adequately treated with vancomycin 5/5 days and Meropenem 5/5 days. Received 1 day of doxycyline on 8/7.  - Failed PO doxycycline, continue vancomycin - Will consult ID for further recommendations.  - Monitor vitals  - WBC this AM   # AKI - Improving. Cr 1.43- baseline ~1. - Continue to monitor - BMP this AM   # Hypoglycemia - Resolved.  - Bed-time snack   # Chronic right pleural effusion: known since around [facility] 2015. He has undergone multiple taps (4) showing transudative process. O2 sat 95-98% on room air. - monitor clinically  # Afib: Stable, rate controlled, currently NSR. - Continue home aspirin - Continue Diltiazem SR 60mg  BID  # OSA: Patient has refused CPAP for several nights. BP slightly elevated at 145/68 - Discussed CPAP refusal, patient going to     - CPAP ordered   # Cirrhosis: likely alcoholic cirrhosis, underwent workup in 2015 with CT, negative HCV; had attempted MRI  but pt was unable to fit in MRI machine. ALT 72   # Alcohol abuse: Stable, no signs of withdrawal, states last drink was 2 months ago - continue to monitor, not on CIWA currently  #FEN/GI: diet carb modified, SLIV Prophylaxis: heparin sq  Disposition: Transferred to step-down ,dispo to home   Subjective: Patient states that overall his leg is feeling better. He feels very sleepy this morning. Over several days, he has not been using his CPAP machine. He states he has not been using the machine because of all the "rules." I explained to him the importance of the CPAP machine and he agreed to start using it, in order to get better rest. Otherwise, patient denies fevers, chills, chest pain, n/v, SOB, abdominal pain.    Objective: Temp:  [97.7 F (36.5 C)-98 F (36.7 C)] 97.9 F (36.6 C) (08/09 0428) Pulse Rate:  [82-92] 92 (08/09 0428) Resp:  [18] 18 (08/09 0428) BP: (121-145)/(68-77) 145/68 mmHg (08/09 0428) SpO2:  [95 %-98 %] 97 % (08/09 0428)  Physical Exam: General: obese, sitting up in chair, very tired this AM, NAD  Cardiovascular: RRR, no m/r/g, radial pulses 2+ Respiratory: normal work of breathing, CTAB  Abdomen: NTND, BS+ Extremities: RLE erythematous, cellulitis continues to remain outside of marked lines - unchanged from yesterday, warm, tender to the palpation. Both Right and Left leg with significant venous stasis.  3+ pitting edema.  Ulcer over base of 5th metatarsal -stable   Laboratory:  Recent Labs Lab 05/05/15 0300 05/06/15 0329 05/07/15 0332  WBC 19.3* 18.0* 15.0*  HGB 14.9 15.3 15.2  HCT 42.0 44.1 43.6  PLT 283 264 272    Recent Labs Lab 05/01/15 1440  05/03/15 0245 05/04/15 0343 05/05/15 0300 05/06/15 0329 05/07/15 0332  NA 134*  < > 130* 133* 134* 133* 130*  K 4.2  < > 5.0 4.9 4.9 5.4* 5.4*  CL 103  < > 99* 105 105 103 98*  CO2 22  < > 21* 18* 20* 23 22  BUN 22*  < > 45* 43* 38* 33* 33*  CREATININE 1.10  < > 2.17* 1.93* 1.60* 1.51* 1.43*   CALCIUM 8.7*  < > 7.8* 7.8* 7.9* 8.1* 8.2*  PROT 7.8  --  6.1* 6.3*  --   --   --   BILITOT 1.0  --  1.0 0.9  --   --   --   ALKPHOS 134*  --  108 112  --   --   --   ALT 45  --  41 37  --   --   --   AST 72*  --  73* 62*  --   --   --   GLUCOSE 93  < > 57* 77 82 77 82  < > = values in this interval not displayed.  Urinalysis    Component Value Date/Time   COLORURINE BROWN* 05/02/2015 2052   APPEARANCEUR TURBID* 05/02/2015 2052   LABSPEC 1.015 05/02/2015 2052   PHURINE 5.5 05/02/2015 2052   GLUCOSEU NEGATIVE 05/02/2015 2052   HGBUR LARGE* 05/02/2015 2052   BILIRUBINUR MODERATE* 05/02/2015 2052   KETONESUR 15* 05/02/2015 2052   PROTEINUR 100* 05/02/2015 2052   UROBILINOGEN 1.0 05/02/2015 2052   NITRITE NEGATIVE 05/02/2015 2052   LEUKOCYTESUR MODERATE* 05/02/2015 2052    Microbiology  Blood Cultures - 3 x day NGTD   Urine Cultures - >=100,000 COLONIES/mL ENTEROCOCCUS SPECIES - pan sensitive  Imaging/Diagnostic Tests: Dg Chest 2 View  05/01/2015   CLINICAL DATA:  New tachypneic, leukocytosis.  Fever and chills.  EXAM: CHEST  2 VIEW  COMPARISON:  03/10/2015  FINDINGS: Small right pleural effusion is unchanged. There is right lower lobe atelectasis/ infiltrate also unchanged.  Left lung remains clear  Heart size upper normal.  Mild vascular congestion without edema.  IMPRESSION: Right pleural effusion and right lower lobe atelectasis/ infiltrate unchanged.   Electronically Signed   By: Franchot Gallo M.D.   On: 05/01/2015 15:43   US Renal  05/03/2015   CLINICAL DATA:  Acute renal insufficiency  EXAM: RENAL / URINARY TRACT ULTRASOUND COMPLETE  COMPARISON:  Ultrasound 03/10/2015  FINDINGS: Right Kidney:  Length: 13.3 cm. Anechoic cyst in the upper pole measuring 6.6 cm. No hydronephrosis.  Left Kidney:  Length: 13.1 cm.  No hydronephrosis.  Bladder:  Appears normal for degree of bladder distention.  IMPRESSION: No hydronephrosis.  Benign-appearing cyst of the RIGHT kidney   Electronically  Signed   By: Suzy Bouchard M.D.   On: 05/03/2015 16:30   Mr Foot Right W Wo Contrast  05/05/2015   CLINICAL DATA:  Lateral foot ulcers and severe foot swelling.  EXAM: MRI OF THE RIGHT FOREFOOT WITHOUT AND WITH CONTRAST  TECHNIQUE: Multiplanar, multisequence MR imaging was performed both before and after administration of intravenous contrast.  CONTRAST:  24mL MULTIHANCE GADOBENATE DIMEGLUMINE 529 MG/ML IV SOLN  COMPARISON:  None.  FINDINGS: There is marked diffuse the subcutaneous soft tissue swelling/ edema/fluid most likely reflecting severe cellulitis. No focal drainable soft tissue abscess, osteomyelitis or septic arthritis. There is  mild diffuse myositis but no findings for pyomyositis.  There are degenerative changes involving the forefoot, midfoot and hindfoot/ ankle. The major tendons and ligaments appear intact.  IMPRESSION: Severe cellulitis and diffuse myositis but no discrete drainable soft tissue abscess.  No definite MR findings for septic arthritis or osteomyelitis.   Electronically Signed   By: Marijo Sanes M.D.   On: 05/05/2015 13:09    Lakiesha Ralphs Cletis Media, MD, MPH 05/08/2015, 7:46 AM PGY-1, [place] Intern pager: [phone], text pages welcome

## 2015-05-08 NOTE — Plan of Care (Signed)
Problem: Phase II Progression Outcomes Goal: Progress activity as tolerated unless otherwise ordered Outcome: Progressing Pt able to ambulate to the bathroom with 1 person stand by assist. Pt is able to call for assistance to ambulate.

## 2015-05-08 NOTE — Progress Notes (Signed)
ANTIBIOTIC CONSULT NOTE - Follow-Up  Pharmacy Consult for Vancomycin Indication: cellulitis  Allergies  Allergen Reactions  . Potassium-Containing Compounds Other (See Comments)    Stomach Pain  . Augmentin [Amoxicillin-Pot Clavulanate] Nausea Only    Most of the time it is okay, but has had some bad experiences with it.  [name] Neomycin Rash    Patient Measurements: Height: 5\' 10"  (177.8 cm) Weight: (!) 393 lb 8.3 oz (178.5 kg) IBW/kg (Calculated) : 73 Adjusted Body Weight:   Vital Signs: Temp: 97.3 F (36.3 C) (08/09 1038) Temp Source: Oral (08/09 1038) BP: 137/69 mmHg (08/09 1038) Pulse Rate: 86 (08/09 1038) Intake/Output from previous day: 08/08 0701 - 08/09 0700 In: 900 [P.O.:900] Out: 700 [Urine:700] Intake/Output from this shift: Total I/O In: 240 [P.O.:240] Out: -   Labs:  Recent Labs  05/06/15 0329 05/07/15 0332 05/08/15 1030  WBC 18.0* 15.0* 14.3*  HGB 15.3 15.2 15.0  PLT 264 272 358  CREATININE 1.51* 1.43* 1.77*   Estimated Creatinine Clearance: 75 mL/min (by C-G formula based on Cr of 1.77). No results for input(s): VANCOTROUGH, VANCOPEAK, VANCORANDOM, GENTTROUGH, GENTPEAK, GENTRANDOM, TOBRATROUGH, TOBRAPEAK, TOBRARND, AMIKACINPEAK, AMIKACINTROU, AMIKACIN in the last 72 hours.   Microbiology: Recent Results (from the past 720 hour(s))  Blood culture (routine x 2)     Status: None   Collection Time: 05/01/15  1:50 PM  Result Value Ref Range Status   Specimen Description BLOOD RIGHT ARM  Final   Special Requests BOTTLES DRAWN AEROBIC AND ANAEROBIC 5CC  Final   Culture   Final    NO GROWTH 5 DAYS Performed at [facility]    Report Status 05/06/2015 FINAL  Final  Blood culture (routine x 2)     Status: None   Collection Time: 05/01/15  1:50 PM  Result Value Ref Range Status   Specimen Description BLOOD LEFT HAND  Final   Special Requests BOTTLES DRAWN AEROBIC AND ANAEROBIC 5CC  Final   Culture   Final    NO GROWTH 5 DAYS Performed at  [facility]    Report Status 05/06/2015 FINAL  Final  Culture, Urine     Status: None   Collection Time: 05/01/15  2:23 PM  Result Value Ref Range Status   Specimen Description URINE, RANDOM  Final   Special Requests NONE  Final   Culture   Final    >=100,000 COLONIES/mL ENTEROCOCCUS SPECIES Performed at [facility]    Report Status 05/04/2015 FINAL  Final   Organism ID, Bacteria ENTEROCOCCUS SPECIES  Final      Susceptibility   Enterococcus species - MIC*    AMPICILLIN <=2 SENSITIVE Sensitive     LEVOFLOXACIN 1 SENSITIVE Sensitive     NITROFURANTOIN <=16 SENSITIVE Sensitive     VANCOMYCIN 1 SENSITIVE Sensitive     LINEZOLID 2 SENSITIVE Sensitive     * >=100,000 COLONIES/mL ENTEROCOCCUS SPECIES  MRSA PCR Screening     Status: None   Collection Time: 05/02/15 12:18 PM  Result Value Ref Range Status   MRSA by PCR NEGATIVE NEGATIVE Final    Comment:        The GeneXpert MRSA Assay (FDA approved for NASAL specimens only), is one component of a comprehensive MRSA colonization surveillance program. It is not intended to diagnose MRSA infection nor to guide or monitor treatment for MRSA infections.     Medical History: Past Medical History  Diagnosis Date  . Atrial fibrillation, chronic   . Hypertension   .  GERD (gastroesophageal reflux disease)   . Septic shock   . CHF (congestive heart failure)   . Shortness of breath dyspnea     Medications:  Prescriptions prior to admission  Medication Sig Dispense Refill Last Dose  . aspirin 81 MG EC tablet Take 1 tablet (81 mg total) by mouth daily. 30 tablet 0 05/01/2015 at Unknown time  . diltiazem (CARDIZEM CD) 180 MG 24 hr capsule TAKE 1 CAPSULE (180 MG TOTAL) BY MOUTH DAILY. 30 capsule 0 05/01/2015 at Unknown time  . folic acid (FOLVITE) 1 MG tablet Take 1 tablet (1 mg total) by mouth daily.   05/01/2015 at Unknown time  . furosemide (LASIX) 40 MG tablet Take 1 tablet (40 mg total) by mouth daily. 30 tablet 0  05/01/2015 at Unknown time  . ketoconazole (NIZORAL) 2 % cream Apply 1 application topically daily as needed for irritation.   Past Month at Unknown time  . Multiple Vitamins-Minerals (CENTRUM SILVER ADULT 50+) TABS Take 125 mg by mouth daily.   05/01/2015 at Unknown time  . spironolactone (ALDACTONE) 50 MG tablet Take 2 tablets (100 mg total) by mouth daily. 60 tablet 0 05/01/2015 at Unknown time  . thiamine 100 MG tablet Take 1 tablet (100 mg total) by mouth daily. 30 tablet 0 05/01/2015 at Unknown time   Scheduled:  . aspirin EC  81 mg Oral Daily  . calcium carbonate  1 tablet Oral TID WC  . diltiazem  60 mg Oral Q12H  . folic acid  1 mg Oral Daily  . heparin  5,000 Units Subcutaneous 3 times per day  . pantoprazole  20 mg Oral Daily  . polyethylene glycol  17 g Oral Daily  . sodium chloride  3 mL Intravenous Q12H  . thiamine  100 mg Oral Daily  . [START ON 05/09/2015] vancomycin  1,750 mg Intravenous Q24H   Infusions:   Assessment: 59 yo male who has been living at a shelter for 3 weeks presents with fever and chills.    PMH: afib, HTN, V0JJ, alcoholic cirrhosis, chronic right pleural effusion, OSA  ID: Cellulitis and possible sepsis/UTI. AF, WBC 25.2>19.3,  LA 2.7 and trend down.  MRI of foot shows no osteo  Rocephin x1 on 8/2 Vanc 8/2 >>8/6, 8/8>> Merrem 8/3 >>8/7 Doxy 8/7>>8/8  8/5 VT = 14 mcg/mL on 1750mg  q24 (SCr 1.93) - no change  8/2 BCx x2 - NGTD 8/2 UCx - Enterococcus (pan sensitive)  Nephro: SCr 1.7 (noted 1.1 on 8/2). CrCl ~ 80 (normalized ~ 45).   Goal of Therapy:  Vancomycin trough level 10-15 mcg/ml  Plan:  -Change vanc to 1750 mg q24h (pt previously therapeutic on this dose).  -F/U clinical status, renal function, VT prn, and ID recs (to see today?)  Levester Fresh, PharmD, [facility] Clinical Pharmacist Pager [phone] 05/08/2015 12:17 PM

## 2015-05-08 NOTE — Progress Notes (Signed)
Pt refuses to wear CPAP tonight.

## 2015-05-08 NOTE — Consult Note (Signed)
[facility] for [facility]ious Disease    Date of Admission:  05/01/2015   Total days of antibiotics 8        Day 2 Vanc (had prior course from 8/2-8/6)               Reason for Consult: RLE cellulitis    Referring Physician: Hensel Primary Care Physician:   Principal Problem:   Cellulitis of right lower leg Active Problems:   A-fib   Essential hypertension   Alcoholism   Pleural effusion on right   Sepsis   UTI (lower urinary tract infection)   Cellulitis of right leg   AKI (acute kidney injury)   Blood poisoning   Ulcer of foot   Paroxysmal atrial fibrillation   . ampicillin-sulbactam (UNASYN) IV  3 g Intravenous Q6H  . aspirin EC  81 mg Oral Daily  . calcium carbonate  1 tablet Oral TID WC  . diltiazem  60 mg Oral Q12H  . folic acid  1 mg Oral Daily  . heparin  5,000 Units Subcutaneous 3 times per day  . pantoprazole  20 mg Oral Daily  . polyethylene glycol  17 g Oral Daily  . thiamine  100 mg Oral Daily    Recommendations: 1. Obtain ABG and ammonia level to r/o CO2 retention vs hepatic encephalopathy as a cause of his change in mental status 2. D/C Vanc and switch to Unasyn 3. Obtain prealbumin, HIV, vascular study of lower extremities. 4. Consult diabetes coordinator, nutrition, case management and social work, particularly since he is homeless.   Assessment: [name] was slightly confused but mostly agitated this AM. This afternoon when ID team came to reassess, his mental status was worse. He was having difficult with word finding and could not provide a good history on his medical conditions. He is noted to have twitching in both hands at the wrist c/w asterixis. Notes from RT state that he has refused his CPAP for multiple nights. His change in mental status is concerning for CO2 retention as well as possible DT's or hepatic encephalopathy. His RLE has progressively worsened according to notes and patient report from this AM. He has not had any  purulent drainage. He does have a diabetic ulcer to the lateral side of his right foot. MRI of foot shows severe cellulitis without signs of osteomyelitis. Given his progressive cellulitis, would like to D/C Vanc and start on Ampicillin since he reports this had just upset his stomach and he did not have an allergic reaction to this.    HPI: Matthew [name] is a 59 y.o. homeless male with PMH of ETOH abuse (last drink was 2 months ago), cirrhosis, chronic right pleural effusion, DMII, afib and OSA who presented to [facility] on 8/2 with sepsis secondary to UTI and RLE cellulitis. Outpatient he had been put on oral doxy for cellulitis (present x3 weeks) and failed therapy. He was initially started on Vanc and Ceftriaxone on admission. Blood cultures were neg. Urine cultures grew out pan-sensitive  Enterococcus. He was switched from Ceftriaxone to Meropenem. He finished 4 days of Meropenem and 5 days of Vanc. He was started on one day of doxy but failed to tolerate. He was restarted on IV Vanc yesterday for worsening RLE cellulitis ID was consulted for recommendations on further management and workup r/t change in mental status this AM.    Review of Systems: Pt poor historian due to mental status States his leg  is worse, unable to recount when the onset of his cellulitis began. Does state that he feels confused.   Past Medical History  Diagnosis Date  . Atrial fibrillation, chronic   . Hypertension   . GERD (gastroesophageal reflux disease)   . Septic shock   . CHF (congestive heart failure)   . Shortness of breath dyspnea     History  Substance Use Topics  . Smoking status: Former Smoker    Types: Cigarettes    Quit date: 11/01/1974  . Smokeless tobacco: Never Used     Comment: smoked in high school for about a year  . Alcohol Use: Yes    History reviewed. No pertinent family history. Allergies  Allergen Reactions  . Potassium-Containing Compounds Other (See Comments)    Stomach Pain  .  Augmentin [Amoxicillin-Pot Clavulanate] Nausea Only    Most of the time it is okay, but has had some bad experiences with it.  [name] Neomycin Rash    OBJECTIVE: Blood pressure 147/68, pulse 74, temperature 97.7 F (36.5 C), temperature source Oral, resp. rate 17, height 5\' 10"  (1.778 m), weight 393 lb 8.3 oz (178.5 kg), SpO2 95 %. General: Pt very confused and having difficulty word finding. He is oriented to person, place and situation.  Skin: RLE: Diabetic ulcer with dark tissue to lateral side of right foot. Erythema from foot to top of leg. Skin very tight and forming blisters. See pictures.  Lungs: Diminished. Cor: Distant heart sounds. RLE edematous +4. Abdomen: Obese. Bowel sounds active X4.  Neuro: Confused. Agitated this AM. Diminished sensation to bilateral feet.   Lab Results Lab Results  Component Value Date   WBC 14.3* 05/08/2015   HGB 15.0 05/08/2015   HCT 41.8 05/08/2015   MCV 92.9 05/08/2015   PLT 358 05/08/2015    Lab Results  Component Value Date   CREATININE 1.77* 05/08/2015   BUN 40* 05/08/2015   NA 133* 05/08/2015   K 5.4* 05/08/2015   CL 101 05/08/2015   CO2 21* 05/08/2015    Lab Results  Component Value Date   ALT 37 05/04/2015   AST 62* 05/04/2015   ALKPHOS 112 05/04/2015   BILITOT 0.9 05/04/2015     Microbiology: Recent Results (from the past 240 hour(s))  Blood culture (routine x 2)     Status: None   Collection Time: 05/01/15  1:50 PM  Result Value Ref Range Status   Specimen Description BLOOD RIGHT ARM  Final   Special Requests BOTTLES DRAWN AEROBIC AND ANAEROBIC 5CC  Final   Culture   Final    NO GROWTH 5 DAYS Performed at [facility]    Report Status 05/06/2015 FINAL  Final  Blood culture (routine x 2)     Status: None   Collection Time: 05/01/15  1:50 PM  Result Value Ref Range Status   Specimen Description BLOOD LEFT HAND  Final   Special Requests BOTTLES DRAWN AEROBIC AND ANAEROBIC 5CC  Final   Culture   Final    NO  GROWTH 5 DAYS Performed at [facility]    Report Status 05/06/2015 FINAL  Final  Culture, Urine     Status: None   Collection Time: 05/01/15  2:23 PM  Result Value Ref Range Status   Specimen Description URINE, RANDOM  Final   Special Requests NONE  Final   Culture   Final    >=100,000 COLONIES/mL ENTEROCOCCUS SPECIES Performed at [facility]    Report Status 05/04/2015  FINAL  Final   Organism ID, Bacteria ENTEROCOCCUS SPECIES  Final      Susceptibility   Enterococcus species - MIC*    AMPICILLIN <=2 SENSITIVE Sensitive     LEVOFLOXACIN 1 SENSITIVE Sensitive     NITROFURANTOIN <=16 SENSITIVE Sensitive     VANCOMYCIN 1 SENSITIVE Sensitive     LINEZOLID 2 SENSITIVE Sensitive     * >=100,000 COLONIES/mL ENTEROCOCCUS SPECIES  MRSA PCR Screening     Status: None   Collection Time: 05/02/15 12:18 PM  Result Value Ref Range Status   MRSA by PCR NEGATIVE NEGATIVE Final    Comment:        The GeneXpert MRSA Assay (FDA approved for NASAL specimens only), is one component of a comprehensive MRSA colonization surveillance program. It is not intended to diagnose MRSA infection nor to guide or monitor treatment for MRSA infections.     Tilden Fossa, NP student [facility] for Infectious Disease [facility] 05/08/2015, 5:47 PM  INFECTIOUS DISEASE ATTENDING ADDENDUM:     [place] [facility] for Infectious Disease   Date: 05/08/2015  Patient name: Matthew [name]  Medical record number: [id number]  Date of birth: 1956/01/13    This patient has been seen and discussed with the NP. Please see her note for complete details. I concur with their findings with the following additions/corrections:  My exam this afternoon the patient was fairly somnolent and confused.   He was having trouble with word finding and was tremulous. He is morbidly obese and unsteady on his feet.  Examination of his lower extremities to about discloses intense erythema  of his right lower extremity in particular he also has a ulcer on the lateral aspect of his right foot see pictures below 05/08/2015:        #1 Cellulitis with intense erythema, nonpurulent but with a diabetic foot ulcer laterally:  His presentation seems much more consistent with a cellulitis due to a streptococcal species. This seems especially true given the fact that he did not respond to doxycycline therapy and that he worsened when narrowed back to doxycycline.  Therefore I'm discontinuing his vancomycin therapy as I see no need for MRSA coverage. Instead will use ampicillin sulbactam which will give [place] excellent coverage of a potential group A or group B streptococcal cellulitis and maintain activity against methicillin sensitive staph aureus. This antibody could also be active against the enterococcus that was in his urine.  I have instituted DIabetic Foot Focused Order Set.  #2 questionable enterococcal urinary tract infection: He does not have much in the way of symptoms describing some intermittent dysuria over a month. He'll be covered regardless with Unasyn.  #3 confusion: I ordered arterial blood gas which fortunately did not show extreme CO2 retention. I wonder if he is suffering from some degree of encephalopathy related to his liver disease and whether he may need institution of lactulose.  Regardless I am concerned about his safety and would recommend transfer to step down unit.  Rhina Br[place] 05/08/2015, 6:15 PM

## 2015-05-08 NOTE — Progress Notes (Signed)
Pt has been in a-fib rate in the 70s to 80s this shift, but rate goes up to the 110s when pt ambulating. Pt is usually asymptomatic. At rest, rate goes back to the 80s. Continue to monitor.

## 2015-05-08 NOTE — Evaluation (Addendum)
Occupational Therapy Evaluation Patient Details Name: Matthew [name] MRN: [id number] DOB: 10-15-1955 Today's Date: 05/08/2015    History of Present Illness 59 y.o. male admitted to [facility] on 05/01/15 for sepsis due to UTI and cellulitis.  Also, of note to have AMS, AKI, and chrrhosis (long standing).  Pt with significant PMHx of A-fib, HTN, CHF, SOB, knee arthroscopy (unknown side- not indicated in chart).     Clinical Impression   Pt admitted with above. Pt independent with ADLs, PTA. Feel pt will benefit from acute OT to increase independence prior to d/c. Recommending SNF for rehab.    Follow Up Recommendations  SNF    Equipment Recommendations  Other (comment) (defer to next venue)    Recommendations for Other Services       Precautions / Restrictions Precautions Precautions: Fall Restrictions Weight Bearing Restrictions: No      Mobility Bed Mobility               General bed mobility comments: not assessed  Transfers Overall transfer level: Needs assistance Equipment used: None Transfers: Sit to/from Stand Sit to Stand: Min assist;Min guard         General transfer comment: assist to boost 1x.    Balance Balance not formally assessed.                             ADL Overall ADL's : Needs assistance/impaired Eating/Feeding: Sitting;Modified independent Eating/Feeding Details (indicate cue type and reason): drank water out of cup Grooming: Wash/dry face;Minimal assistance;Brushing hair;Applying deodorant;Sitting  (OT moved gown out of the way)                  Lower Body Dressing: Maximal assistance;Sit to/from stand   Toilet Transfer: Min guard;Minimal assistance (sit to stand from chair)             General ADL Comments: Pt unable to reach to don/doff socks.      Vision     Perception     Praxis      Pertinent Vitals/Pain Pain Assessment: 0-10 Pain Score:  (7-8) Pain Location: RLE Pain Descriptors / Indicators:  Sore Pain Intervention(s): Repositioned;Monitored during session     Hand Dominance     Extremity/Trunk Assessment Upper Extremity Assessment Upper Extremity Assessment: RUE deficits/detail;LUE deficits/detail RUE Deficits / Details: jerky movements RUE Coordination: decreased fine motor;decreased gross motor LUE Deficits / Details: jerky movements LUE Coordination: decreased fine motor;decreased gross motor   Lower Extremity Assessment Lower Extremity Assessment: Defer to PT evaluation   Cervical / Trunk Assessment Cervical / Trunk Assessment: Normal   Communication Communication Communication: Expressive difficulties   Cognition Arousal/Alertness: Lethargic Behavior During Therapy: Agitated Overall Cognitive Status: No family/caregiver present to determine baseline cognitive functioning Area of Impairment: Problem solving      Problem Solving: Slow processing     General Comments       Exercises       Shoulder Instructions      Home Living Family/patient expects to be discharged to:: Shelter/Homeless                             Home Equipment: None   Additional Comments: In a transitional care home, he could not relay much about the layout.       Prior Functioning/Environment Level of Independence: Independent        Comments: per pt he does  not use an assistive device.     OT Diagnosis: Altered mental status;Acute pain   OT Problem List: Obesity;Impaired UE functional use;Pain;Decreased coordination;Decreased cognition;Decreased safety awareness;Decreased knowledge of use of DME or AE;Decreased knowledge of precautions;Decreased activity tolerance;Decreased range of motion;Decreased strength   OT Treatment/Interventions: Self-care/ADL training;Therapeutic exercise;DME and/or AE instruction;Therapeutic activities;Cognitive remediation/compensation;Patient/family education;Balance training    OT Goals(Current goals can be found in the care  plan section) Acute Rehab OT Goals Patient Stated Goal: get back to work/go home OT Goal Formulation: With patient (unsure he fully understood as he had apparent decreased cognition) Time For Goal Achievement: 05/15/15 Potential to Achieve Goals: Good ADL Goals Pt Will Perform Lower Body Bathing: sit to/from stand;with adaptive equipment;with supervision;with set-up Pt Will Perform Lower Body Dressing: with adaptive equipment;sit to/from stand;with supervision;with set-up Pt Will Transfer to Toilet: ambulating;with supervision Pt Will Perform Toileting - Clothing Manipulation and hygiene: with set-up;with supervision;sit to/from stand  OT Frequency: Min 2X/week   Barriers to D/C:            Co-evaluation              End of Session Equipment Utilized During Treatment: Gait belt  Activity Tolerance: Patient limited by lethargy and had just walked with PT Patient left: in chair;with call bell/phone within reach;with chair alarm set   Time: 9937-1696 OT Time Calculation (min): 11 min Charges:  OT General Charges $OT Visit: 1 Procedure OT Evaluation $Initial OT Evaluation Tier I: 1 Procedure G-CodesBenito Mccreedy OTR/L [id number] 05/08/2015, 5:24 PM

## 2015-05-08 NOTE — Progress Notes (Signed)
Patient up and down to bed to chair and to bathroom. Try to enc him to sleep in bed . He  does not know where he wants to sleep back and forth to bed and chair. Enc to keep feet up in recliner chair. He first wanted CPap and now refuses.

## 2015-05-08 NOTE — Progress Notes (Signed)
Patient was still acting tired and somewhat confused this afternoon. An ABG and ammonia order and still pending. ID transitioned patient to Unasyn this afternoon from vancomycin. Currently still waiting on step-down placement. Will check on patient in the next couple of hours

## 2015-05-08 NOTE — Clinical Social Work Note (Addendum)
CSW spoke with patient who confirmed that he is from [facility] and is not homeless, he is planning to return.  CSW contacted [facility] to confirm that he can return once he is medically ready for discharge, left message on voice mail awaiting call back.  CSW to continue to follow patient's progress.  1:30pm  CSW received phone call back from [place], who said patient can return back to [facility], as long as he does not have extensive medical needs and does not require home health.  CSW informed house manager that PT and OT were going to see patient to give recommendation.  House manager stated that if home health is recommended, they can not see him at [facility] based on the transitional environment for the residents.  CSW informed case manager and said that patient would most likely have to go to SNF if he needs therapy.  House manager also stated that once patient is ready to discharge, she needs to be contacted by CSW at [phone] ext. [phone] to discuss what needs patient has to make sure they can be met.  CSW to continue to follow patient's discharge planning.    Jones Broom. [place], MSW, [place] 05/08/2015 12:15 PM

## 2015-05-09 ENCOUNTER — Encounter (HOSPITAL_COMMUNITY): Payer: Self-pay

## 2015-05-09 DIAGNOSIS — G9340 Encephalopathy, unspecified: Secondary | ICD-10-CM

## 2015-05-09 DIAGNOSIS — E1142 Type 2 diabetes mellitus with diabetic polyneuropathy: Secondary | ICD-10-CM

## 2015-05-09 DIAGNOSIS — R410 Disorientation, unspecified: Secondary | ICD-10-CM

## 2015-05-09 LAB — GLUCOSE, CAPILLARY
Glucose-Capillary: 65 mg/dL (ref 65–99)
Glucose-Capillary: 73 mg/dL (ref 65–99)
Glucose-Capillary: 77 mg/dL (ref 65–99)
Glucose-Capillary: 95 mg/dL (ref 65–99)
Glucose-Capillary: 91 mg/dL (ref 65–99)
Glucose-Capillary: 94 mg/dL (ref 65–99)

## 2015-05-09 LAB — COMPREHENSIVE METABOLIC PANEL
ALBUMIN: 1.9 g/dL — AB (ref 3.5–5.0)
ALT: 29 U/L (ref 17–63)
ANION GAP: 10 (ref 5–15)
AST: 46 U/L — ABNORMAL HIGH (ref 15–41)
Alkaline Phosphatase: 147 U/L — ABNORMAL HIGH (ref 38–126)
BILIRUBIN TOTAL: 0.9 mg/dL (ref 0.3–1.2)
BUN: 38 mg/dL — ABNORMAL HIGH (ref 6–20)
CO2: 22 mmol/L (ref 22–32)
Calcium: 8.2 mg/dL — ABNORMAL LOW (ref 8.9–10.3)
Chloride: 105 mmol/L (ref 101–111)
Creatinine, Ser: 1.72 mg/dL — ABNORMAL HIGH (ref 0.61–1.24)
GFR, EST AFRICAN AMERICAN: 49 mL/min — AB (ref >60)
GFR, EST NON AFRICAN AMERICAN: 42 mL/min — AB (ref >60)
Glucose, Bld: 62 mg/dL — ABNORMAL LOW (ref 65–99)
Potassium: 5.2 mmol/L — ABNORMAL HIGH (ref 3.5–5.1)
Sodium: 137 mmol/L (ref 135–145)
TOTAL PROTEIN: 7 g/dL (ref 6.5–8.1)

## 2015-05-09 LAB — HIV ANTIBODY (ROUTINE TESTING W REFLEX)
HIV SCREEN 4TH GENERATION: NONREACTIVE
HIV Screen 4th Generation wRfx: NONREACTIVE

## 2015-05-09 LAB — CBC
HCT: 41.4 % (ref 39.0–52.0)
Hemoglobin: 14.7 g/dL (ref 13.0–17.0)
MCH: 33.6 pg (ref 26.0–34.0)
MCHC: 35.5 g/dL (ref 30.0–36.0)
MCV: 94.7 fL (ref 78.0–100.0)
Platelets: 393 10*3/uL (ref 150–400)
RBC: 4.37 MIL/uL (ref 4.22–5.81)
RDW: 16.3 % — ABNORMAL HIGH (ref 11.5–15.5)
WBC: 16 10*3/uL — AB (ref 4.0–10.5)

## 2015-05-09 LAB — PREALBUMIN: Prealbumin: 5.8 mg/dL — ABNORMAL LOW (ref 18–38)

## 2015-05-09 MED ORDER — ENSURE ENLIVE PO LIQD
237.0000 mL | Freq: Two times a day (BID) | ORAL | Status: DC
  Administered 2015-05-09 – 2015-05-11 (×4): 237 mL via ORAL

## 2015-05-09 MED ORDER — FUROSEMIDE 10 MG/ML PO SOLN
40.0000 mg | Freq: Every day | ORAL | Status: DC
Start: 2015-05-09 — End: 2015-05-11
  Administered 2015-05-09 – 2015-05-11 (×3): 40 mg via ORAL
  Filled 2015-05-09 (×4): qty 4

## 2015-05-09 MED ORDER — PRO-STAT SUGAR FREE PO LIQD
30.0000 mL | Freq: Two times a day (BID) | ORAL | Status: DC
  Administered 2015-05-10 – 2015-05-11 (×2): 30 mL via ORAL
  Filled 2015-05-09 (×5): qty 30

## 2015-05-09 NOTE — Progress Notes (Signed)
FMTS Attending Daily Note:  S:  Still confused this AM. Sitting in chair.  Doesn't remember seeing me yesterday.  No real complaints.  Exam:  GEn: Obese male, in chair.  NAD.  Groggy appearing Heart:  RRR Lungs:  Poor inspiratory effort Abd:  Obese/NT Ext:  LLE with chronic venous stasis noted.  RLL with erythema that is now improving from yesterday.  Still warm, still edematous.   Neuro:  Oriented only to person and hospital.  Not date or reason he's here.  Asterixis noted BL  Imp/Plan: 1. Altered mental status: - likely secondary to cirrhosis. - lactulose administered, no BM as of yet - awaiting stepdown bed, none available currently. - ABG without evidence of acute hypercarbia yesterday.  2.  RLL cellulitis: - on Unasyn currently.  Agree likely strep as etiologic agent.  - Greatly appreciate ID input and recommendations.  - afebrile, leukocytosis up.   - awaiting vascular imaging of leg.   3.  Cirrhosis: - restarting furosemide to help with fluid overload (this is home med) - hold on aldactone due to hyperkalemia - consider rifaximine if not much response with lactulose.    4. OSA: - encouraged CPAP   Alveda Reasons, MD 05/09/2015 9:54 AM

## 2015-05-09 NOTE — Progress Notes (Signed)
Hypoglycemic Event  CBG: 65 at 0614   Treatment: 2 orange juice and graham crackers  Symptoms: pt asymptomatic  Follow-up CBG: Time: 0648 CBG Result: 77  Possible Reasons for Event: lack of PO intake during day and night shift     [name],Matthew [name] A  05/09/2015   Remember to initiate Hypoglycemia Order Set & complete

## 2015-05-09 NOTE — Progress Notes (Signed)
Initial Nutrition Assessment  DOCUMENTATION CODES:   Morbid obesity  INTERVENTION:   Ensure Enlive po BID, each supplement provides 350 kcal and 20 grams of protein  Prostat liquid protein po 30 ml BID with meals, each supplement provides 100 kcal, 15 grams protein  NUTRITION DIAGNOSIS:   Increased nutrient needs related to wound healing as evidenced by estimated needs  GOAL:   Patient will meet greater than or equal to 90% of their needs  MONITOR:   PO intake, Supplement acceptance, Labs, Weight trends, Skin, I & O's  REASON FOR ASSESSMENT:   Consult Wound healing  ASSESSMENT:   59 y.o. Male presented with sepsis secondary to UTI and cellulitis . PMH is significant for afib, HTN, I6EV, alcoholic cirrhosis, chronic right pleural effusion, OSA.  Pt sleepy, confused upon RD visit.  PO intake 50% this AM at breakfast.  CWOCN note reviewed.  Pt with neuropathic foot ulceration, R lower extremity with fluid-filled blisters.  Would benefit from addition of oral nutrition supplements.  RD to order.  RD unable to complete Nutrition Focused Physical Exam at this time.  Diet Order:  Diet Carb Modified Fluid consistency:: Thin; Room service appropriate?: Yes  Skin:  Wound (see comment) (Right lateral midfoot ulcer)  Last BM:  PTA  Height:   Ht Readings from Last 1 Encounters:  05/02/15 5\' 10"  (1.778 m)    Weight:   Wt Readings from Last 1 Encounters:  05/09/15 383 lb 12.8 oz (174.091 kg)    Ideal Body Weight:  75.4 kg  BMI:  Body mass index is 55.07 kg/(m^2).  Estimated Nutritional Needs:   Kcal:  2000-2200  Protein:  120-130 gm  Fluid:  2.0-2.2 L  CBG (last 3)   Recent Labs  05/09/15 0614 05/09/15 0648 05/09/15 1118  GLUCAP 65 77 95    EDUCATION NEEDS:   No education needs identified at this time  Arthur Holms, RD, LDN Pager #: [phone] Pager #: [phone]

## 2015-05-09 NOTE — Progress Notes (Signed)
Notified Rapid response with concerns of continued AMS and lactic acid value of 90. Pt still has had no BM after one dose of lactulose. Rapid not available to come to the bedside (in another event), but reccommended continuous pulse ox and earlier dose of lactulose. Pt sats 93-99 on room air. MD notified concerning Rapid response suggestions and no new orders were given at this time. Continuous pulse ox applied.   Pt still confused and alert to self only.   Will continue to monitor.   Earlie Lou

## 2015-05-09 NOTE — Progress Notes (Signed)
Family Medicine Teaching Service Daily Progress Note Intern Pager: [phone]  Patient name: Matthew [name] Medical record number: [id number] Date of birth: March 18, 1956 Age: 59 y.o. Gender: male  Primary Care Provider: Junie Panning, DO Consultants: None  Code Status: Full   Pt Overview and Major Events to Date:  8/2 - admit for cellulitis 8/3 - WBC spike and transfer to SDU 8/7 - PO abx, tx to floor 8/9 -  Failed PO, continue vancomycin 8/7-8/8 - Hypoglycemia    Assessment and Plan: [name] is a 59 y.o. male presenting with sepsis secondary to UTI and cellulitis . PMH is significant for afib, HTN, Y7CW, alcoholic cirrhosis, chronic right pleural effusion, OSA  # Sepsis 2/2 RLE cellulitis and Enterococcus UTI: BP improving. WBC downtrending, afebrile. RLE remains  erythematous.  No evidence of osteo on MRI , imaging showing severe cellulitis and diffuse myositis but no discrete drainable soft tissue abscess. Ortho indicates medical management. Enterococcus UTI adequately treated with vancomycin 6/6 days and Meropenem 5/5 days. Received 1 day of doxycyline on 8/7. ID consulted for failed PO antibiotics.  - Started Unasyn per ID recs, most like streptococcus pyogenes due to lack of pus associated with infection  - Monitor vitals, stable overnight  - WBC 16.0< 14.3, slightly up-trending   # AKI - Improving. SCr 1.72, downtrending- baseline ~1. - Patient SCr trending up - stop fluids, patient PO intake improved this AM.  - Continue to monitor, BMPs  # Altered Mental Status - Patient seemed more confused yesterday afternoon. However, patient has been increasingly tired over the past two days as he has not slept well Orientated to self and place.However stated the year was August 2004. Concerns included hypercapnia (due to OSA) vs. Hepatic encephalopathy (hx of cirrhosis), vs. Worsening infection. ABG ph 7.41 pCo2 36.7 pO2 80.5 normal, therefore unlikely hypercapnia,  Ammonia 90.  Received a dose of Lactulose and had BM. Patient also slept better.  - Physical Exam positive for teleangiectasia, palmar erythema, jaundice,  - Patient this AM signficantly better, orientated to self, place, time (August 2007) - Negative Asterixis this AM, overnight was positive  - Will consider consulting GI  - Lactulose 30 grams TID - Will consider Rifaximin, this afternoon if still altered - Started 40 mg Lasix daily  - Will continue to monitor   # Hypoglycemia - Resolved.  - Bed-time snack  - Continue CBGs  # Chronic right pleural effusion: known since around [facility] 2015. He has undergone multiple taps (4) showing transudative process. O2 sat 97%- 100% on room air. - monitor clinically  # Afib: Stable, rate controlled, currently NSR. - Continue home aspirin - Continue Diltiazem SR 60mg  BID  # OSA: Patient continues to refuse CPAP, despite several conversations with patient about the need to use CPAP at night in order to get rest.  BP 136/79 - Discussed CPAP refusal, patient states he was just very confused last night.  - CPAP ordered   # Cirrhosis: likely alcoholic cirrhosis, underwent workup in 2015 with CT, negative HCV; had attempted MRI but pt was unable to fit in MRI machine. AST/ALT 46/29 on 8/10 - Started Lasix 40 mg daily   # Alcohol abuse: Stable, no signs of withdrawal, states last drink was 2 months ago - continue to monitor, not on CIWA currently  #FEN/GI: diet carb modified, SLIV Prophylaxis: heparin sq  Disposition: Consider med-surg ,dispo to home   Subjective: Patient is a lot more alert and orientated this AM. He states he  feels a lot less confused then yesterday. He indicates that his right knee feels significantly more swollen. Discussed elevating leg with patient, helped him this morning to put is right leg up. Denies fevers, chills, chest pain, n/v, SOB, abdominal pain.   Objective: Temp:  [97.3 F (36.3 C)-98 F (36.7 C)] 97.9 F (36.6 C)  (08/10 0650) Pulse Rate:  [74-95] 95 (08/10 0650) Resp:  [16-18] 17 (08/10 0650) BP: (136-149)/(68-79) 136/79 mmHg (08/10 0650) SpO2:  [95 %-100 %] 100 % (08/10 0650) Weight:  [174.091 kg (383 lb 12.8 oz)-178.5 kg (393 lb 8.3 oz)] 174.091 kg (383 lb 12.8 oz) (08/10 0650)  Physical Exam: General: obese, sitting up in chair, very tired this AM, NAD  Cardiovascular: RRR, no m/r/g, radial pulses 2+,  Respiratory: normal work of breathing, CTAB  Abdomen: NTND, BS+ Extremities: RLE erythematous, thigh much less erythematous today, very swollen LE , no tenderness to palpation. Both Right and Left leg with significant venous stasis.  3+ pitting edema.  Ulcer over base of 5th metatarsal -stable  Skin- teleangiectasia on chest, palmar erythema   Laboratory:  Recent Labs Lab 05/07/15 0332 05/08/15 1030 05/09/15 0511  WBC 15.0* 14.3* 16.0*  HGB 15.2 15.0 14.7  HCT 43.6 41.8 41.4  PLT 272 358 393    Recent Labs Lab 05/03/15 0245 05/04/15 0343  05/06/15 0329 05/07/15 0332 05/08/15 1030  NA 130* 133*  < > 133* 130* 133*  K 5.0 4.9  < > 5.4* 5.4* 5.4*  CL 99* 105  < > 103 98* 101  CO2 21* 18*  < > 23 22 21*  BUN 45* 43*  < > 33* 33* 40*  CREATININE 2.17* 1.93*  < > 1.51* 1.43* 1.77*  CALCIUM 7.8* 7.8*  < > 8.1* 8.2* 8.2*  PROT 6.1* 6.3*  --   --   --   --   BILITOT 1.0 0.9  --   --   --   --   ALKPHOS 108 112  --   --   --   --   ALT 41 37  --   --   --   --   AST 73* 62*  --   --   --   --   GLUCOSE 57* 77  < > 77 82 89  < > = values in this interval not displayed.  Urinalysis    Component Value Date/Time   COLORURINE BROWN* 05/02/2015 2052   APPEARANCEUR TURBID* 05/02/2015 2052   LABSPEC 1.015 05/02/2015 2052   PHURINE 5.5 05/02/2015 2052   GLUCOSEU NEGATIVE 05/02/2015 2052   HGBUR LARGE* 05/02/2015 2052   BILIRUBINUR MODERATE* 05/02/2015 2052   KETONESUR 15* 05/02/2015 2052   PROTEINUR 100* 05/02/2015 2052   UROBILINOGEN 1.0 05/02/2015 2052   NITRITE NEGATIVE  05/02/2015 2052   LEUKOCYTESUR MODERATE* 05/02/2015 2052    Microbiology  Blood Cultures - 3 x day NGTD   Urine Cultures - >=100,000 COLONIES/mL ENTEROCOCCUS SPECIES - pan sensitive  Imaging/Diagnostic Tests: Dg Chest 2 View  05/01/2015   CLINICAL DATA:  New tachypneic, leukocytosis.  Fever and chills.  EXAM: CHEST  2 VIEW  COMPARISON:  03/10/2015  FINDINGS: Small right pleural effusion is unchanged. There is right lower lobe atelectasis/ infiltrate also unchanged.  Left lung remains clear  Heart size upper normal.  Mild vascular congestion without edema.  IMPRESSION: Right pleural effusion and right lower lobe atelectasis/ infiltrate unchanged.   Electronically Signed   By: Franchot Gallo M.D.  On: 05/01/2015 15:43   US Renal  05/03/2015   CLINICAL DATA:  Acute renal insufficiency  EXAM: RENAL / URINARY TRACT ULTRASOUND COMPLETE  COMPARISON:  Ultrasound 03/10/2015  FINDINGS: Right Kidney:  Length: 13.3 cm. Anechoic cyst in the upper pole measuring 6.6 cm. No hydronephrosis.  Left Kidney:  Length: 13.1 cm.  No hydronephrosis.  Bladder:  Appears normal for degree of bladder distention.  IMPRESSION: No hydronephrosis.  Benign-appearing cyst of the RIGHT kidney   Electronically Signed   By: Suzy Bouchard M.D.   On: 05/03/2015 16:30   Mr Foot Right W Wo Contrast  05/05/2015   CLINICAL DATA:  Lateral foot ulcers and severe foot swelling.  EXAM: MRI OF THE RIGHT FOREFOOT WITHOUT AND WITH CONTRAST  TECHNIQUE: Multiplanar, multisequence MR imaging was performed both before and after administration of intravenous contrast.  CONTRAST:  58mL MULTIHANCE GADOBENATE DIMEGLUMINE 529 MG/ML IV SOLN  COMPARISON:  None.  FINDINGS: There is marked diffuse the subcutaneous soft tissue swelling/ edema/fluid most likely reflecting severe cellulitis. No focal drainable soft tissue abscess, osteomyelitis or septic arthritis. There is mild diffuse myositis but no findings for pyomyositis.  There are degenerative changes  involving the forefoot, midfoot and hindfoot/ ankle. The major tendons and ligaments appear intact.  IMPRESSION: Severe cellulitis and diffuse myositis but no discrete drainable soft tissue abscess.  No definite MR findings for septic arthritis or osteomyelitis.   Electronically Signed   By: Marijo Sanes M.D.   On: 05/05/2015 13:09    Quanasia Defino Cletis Media, MD, MPH 05/09/2015, 6:59 AM PGY-1, [place] Intern pager: [phone], text pages welcome

## 2015-05-09 NOTE — Progress Notes (Signed)
[facility] for Infectious Disease    Date of Admission:  05/01/2015   Total days of antibiotics: 9       Day 2 Unasyn  Principal Problem:   Cellulitis of right lower leg Active Problems:   A-fib   Essential hypertension   Alcoholism   Pleural effusion on right   Sepsis   UTI (lower urinary tract infection)   Cellulitis of right leg   AKI (acute kidney injury)   Blood poisoning   Ulcer of foot   Paroxysmal atrial fibrillation   Diabetic ulcer of right foot associated with type 2 diabetes mellitus   Acute encephalopathy   Alcoholic cirrhosis of liver without ascites   . ampicillin-sulbactam (UNASYN) IV  3 g Intravenous Q6H  . aspirin EC  81 mg Oral Daily  . calcium carbonate  1 tablet Oral TID WC  . diltiazem  60 mg Oral Q12H  . folic acid  1 mg Oral Daily  . heparin  5,000 Units Subcutaneous 3 times per day  . lactulose  30 g Oral TID  . pantoprazole  20 mg Oral Daily  . polyethylene glycol  17 g Oral Daily  . thiamine  100 mg Oral Daily    Subjective: Mr. Wymer continues to be slightly confused, stating his brain feels like it is in a fog. However, this is improved from his exam yesterday. He states that his leg is getting a little better. He denies pain. Also reports several BM's this AM r/t Lactulose.   Review of Systems: Constitutional: Denies fevers, chills and pain Respiratory: refuses to wear CPAP, denies shortness of breath Cardiovascular: Denies chest pain, palpitations Gastrointestinal: Reports several BM's since receiving lactulose Genitourinary:Denies urinary urgency, denies burning with urination Integument/breast: States redness is slightly improving.  Neurological: Reports feeling like his brain is foggy and feels confused, but improving from yesterday  Past Medical History  Diagnosis Date  . Atrial fibrillation, chronic   . Hypertension   . GERD (gastroesophageal reflux disease)   . Septic shock   . CHF (congestive heart failure)    . Shortness of breath dyspnea     Social History  Substance Use Topics  . Smoking status: Former Smoker    Types: Cigarettes    Quit date: 11/01/1974  . Smokeless tobacco: Never Used     Comment: smoked in high school for about a year  . Alcohol Use: Yes    History reviewed. No pertinent family history. Allergies  Allergen Reactions  . Potassium-Containing Compounds Other (See Comments)    Stomach Pain  . Augmentin [Amoxicillin-Pot Clavulanate] Nausea Only    Most of the time it is okay, but has had some bad experiences with it.  [name] Neomycin Rash    OBJECTIVE: Blood pressure 136/79, pulse 95, temperature 97.9 F (36.6 C), temperature source Oral, resp. rate 17, height 5\' 10"  (1.778 m), weight 383 lb 12.8 oz (174.091 kg), SpO2 100 %. General: NAD, falls asleep through exam Skin: RLE erythematous. Upper right leg is less erythematous than yesterday. Diabetic foot ulcer with eschar over lateral side of right foot present. Brown discoloration to LLE. Chronic lymphedema skin changes to abdomen.  Lungs: CTA, diminished Cor: heart sounds diminished. +4 edema to RLE.  Abdomen: Obese, bowel sounds hyperactive.  Neuro: Diminished sensation to light touch bilateral feet. Asterixis to bilateral hands.    Lab Results Lab Results  Component Value Date   WBC 16.0* 05/09/2015   HGB 14.7  05/09/2015   HCT 41.4 05/09/2015   MCV 94.7 05/09/2015   PLT 393 05/09/2015    Lab Results  Component Value Date   CREATININE 1.72* 05/09/2015   BUN 38* 05/09/2015   NA 137 05/09/2015   K 5.2* 05/09/2015   CL 105 05/09/2015   CO2 22 05/09/2015    Lab Results  Component Value Date   ALT 29 05/09/2015   AST 46* 05/09/2015   ALKPHOS 147* 05/09/2015   BILITOT 0.9 05/09/2015     Microbiology: Recent Results (from the past 240 hour(s))  Blood culture (routine x 2)     Status: None   Collection Time: 05/01/15  1:50 PM  Result Value Ref Range Status   Specimen Description BLOOD RIGHT ARM   Final   Special Requests BOTTLES DRAWN AEROBIC AND ANAEROBIC 5CC  Final   Culture   Final    NO GROWTH 5 DAYS Performed at [facility]    Report Status 05/06/2015 FINAL  Final  Blood culture (routine x 2)     Status: None   Collection Time: 05/01/15  1:50 PM  Result Value Ref Range Status   Specimen Description BLOOD LEFT HAND  Final   Special Requests BOTTLES DRAWN AEROBIC AND ANAEROBIC 5CC  Final   Culture   Final    NO GROWTH 5 DAYS Performed at [facility]    Report Status 05/06/2015 FINAL  Final  Culture, Urine     Status: None   Collection Time: 05/01/15  2:23 PM  Result Value Ref Range Status   Specimen Description URINE, RANDOM  Final   Special Requests NONE  Final   Culture   Final    >=100,000 COLONIES/mL ENTEROCOCCUS SPECIES Performed at [facility]    Report Status 05/04/2015 FINAL  Final   Organism ID, Bacteria ENTEROCOCCUS SPECIES  Final      Susceptibility   Enterococcus species - MIC*    AMPICILLIN <=2 SENSITIVE Sensitive     LEVOFLOXACIN 1 SENSITIVE Sensitive     NITROFURANTOIN <=16 SENSITIVE Sensitive     VANCOMYCIN 1 SENSITIVE Sensitive     LINEZOLID 2 SENSITIVE Sensitive     * >=100,000 COLONIES/mL ENTEROCOCCUS SPECIES  MRSA PCR Screening     Status: None   Collection Time: 05/02/15 12:18 PM  Result Value Ref Range Status   MRSA by PCR NEGATIVE NEGATIVE Final    Comment:        The GeneXpert MRSA Assay (FDA approved for NASAL specimens only), is one component of a comprehensive MRSA colonization surveillance program. It is not intended to diagnose MRSA infection nor to guide or monitor treatment for MRSA infections.     Assessment/Plan: 1. Right lower extremity nonpurulent cellulitis: Mr. Ruffolo seems to have a slight improvement in the appearance of his right lower extremity as it is less red than it was yesterday. He remains afebrile. He does have some small blisters over his RLE, non weeping and no purulent drainage  present. He had previously been on doxy but failed therapy as his leg became worse on this regimen. Based on this history and the appearance of his leg, we will continue with strep coverage as well as MSSA coverage with Unasyn. This would also cover potential enterococcal infection of urine as he had recently been treated for this and is currently asymptomatic. - Cont Unasyn for strep/staph/enterococcal coverage - HIV neg  2. Diabetic foot ulcer to lateral side of right foot: Mr. Kirk diabetic foot ulcer  does have eschar covering the top. There is no drainage. He does have neuropathy to his feet. Will need diabetic foot ulcer work up. Wound care did consult and ordered topical treatment and Prevalon boot for pressure relief.  - Pending lower extremity ABI's - Nutrition consult - Wound care consulted and rec's given  - Social work and CM to consult  3. Acute hepatic encephalopathy: Yesterday morning he was distractable and slightly agitated with exam. By the afternoon, he had significant changes to his exam which warranted further workup to include r/o hepatic encephalopathy r/t cirrhosis of liver, ETOH abuse vs hypercapnea r/t OSA and refusing CPAP. Based on his exam and PMH, we ordered ABG's and a serum ammonia level. ABG had no significant finding but his ammonia returned at 90. Lactulose was started and his exam has improved this AM, although he is still easily distracted with word finding difficulties. He reports several BM's this morning and states that he does feel less cloudy in the head.  - Family medicine has ordered Lactulose TID. - Hep C neg in 10/2013 - He has been continued on Furosemide to help with fluid overload    Tilden Fossa, NP student [facility] 05/09/2015, 11:30 AM   INFECTIOUS DISEASE ATTENDING ADDENDUM:     [facility] for Infectious Disease   Date: 05/09/2015  Patient name: DARUS HERSHMAN  Medical record number: [id number]  Date of birth: [birth year]    This patient has been seen and discussed with the NP. Please see her note for complete details. I concur with their findings with the following additions/corrections:   Patient much more clear today and asking RN to write down phone numbers of his friends.  He is still leaking urine frequently  Erythema in leg seems improved, certainly stable  [birth year]:    05/09/15:      #1 Cellulitis: continue unasyn, wound care, ? Need a foley catheter or condom catheter to avoid urine draining down leg  If he continues to improve with re to his cellulitis would dc on augmentin to complete 3 week course of abx (including that which he has received in the hospital  Leg elevation will help  #2 DFU: would get complete XRAY right foot when able DF focused order set  #3 confusion: seems due to hepatic encephalopathy.   I will sign off for now please call with further questions.    Rhina Brackett [place] 05/09/2015, 8:11 PM

## 2015-05-09 NOTE — Progress Notes (Signed)
Dressing change done again on the right lower leg.  Patient's dressings do not remain dry because of the way he insists on urinating. Urine runs down his legs every time he gets up to the bedside commode. Patient's skin on his legs remain constantly moist on his lower extremities.[name]

## 2015-05-09 NOTE — Progress Notes (Signed)
Patient stated that he hasn't been wearing his CPAP and does not want to wear it tonight. RT will continue to monitor.

## 2015-05-10 ENCOUNTER — Inpatient Hospital Stay (HOSPITAL_COMMUNITY): Payer: Medicaid Other

## 2015-05-10 DIAGNOSIS — M79604 Pain in right leg: Secondary | ICD-10-CM

## 2015-05-10 LAB — CBC
HCT: 44 % (ref 39.0–52.0)
Hemoglobin: 15.5 g/dL (ref 13.0–17.0)
MCH: 33.4 pg (ref 26.0–34.0)
MCHC: 35.2 g/dL (ref 30.0–36.0)
MCV: 94.8 fL (ref 78.0–100.0)
Platelets: 370 10*3/uL (ref 150–400)
RBC: 4.64 MIL/uL (ref 4.22–5.81)
RDW: 16.3 % — AB (ref 11.5–15.5)
WBC: 15.7 10*3/uL — ABNORMAL HIGH (ref 4.0–10.5)

## 2015-05-10 LAB — GLUCOSE, CAPILLARY
GLUCOSE-CAPILLARY: 79 mg/dL (ref 65–99)
GLUCOSE-CAPILLARY: 115 mg/dL — AB (ref 65–99)
Glucose-Capillary: 136 mg/dL — ABNORMAL HIGH (ref 65–99)
Glucose-Capillary: 122 mg/dL — ABNORMAL HIGH (ref 65–99)

## 2015-05-10 LAB — COMPREHENSIVE METABOLIC PANEL
ALBUMIN: 1.8 g/dL — AB (ref 3.5–5.0)
ALT: 28 U/L (ref 17–63)
AST: 42 U/L — AB (ref 15–41)
Alkaline Phosphatase: 135 U/L — ABNORMAL HIGH (ref 38–126)
Anion gap: 10 (ref 5–15)
BUN: 31 mg/dL — AB (ref 6–20)
CO2: 25 mmol/L (ref 22–32)
CREATININE: 1.8 mg/dL — AB (ref 0.61–1.24)
Calcium: 8.2 mg/dL — ABNORMAL LOW (ref 8.9–10.3)
Chloride: 105 mmol/L (ref 101–111)
GFR calc Af Amer: 46 mL/min — ABNORMAL LOW (ref >60)
GFR calc non Af Amer: 40 mL/min — ABNORMAL LOW (ref >60)
Glucose, Bld: 87 mg/dL (ref 65–99)
Potassium: 4.6 mmol/L (ref 3.5–5.1)
Sodium: 140 mmol/L (ref 135–145)
Total Bilirubin: 1 mg/dL (ref 0.3–1.2)
Total Protein: 7.5 g/dL (ref 6.5–8.1)

## 2015-05-10 LAB — HEMOGLOBIN A1C
HEMOGLOBIN A1C: 6.2 % — AB (ref 4.8–5.6)
MEAN PLASMA GLUCOSE: 131 mg/dL

## 2015-05-10 MED ORDER — MORPHINE SULFATE 2 MG/ML IJ SOLN
2.0000 mg | INTRAMUSCULAR | Status: DC | PRN
  Administered 2015-05-10 (×2): 2 mg via INTRAVENOUS
  Filled 2015-05-10 (×2): qty 1

## 2015-05-10 MED ORDER — AMOXICILLIN-POT CLAVULANATE 875-125 MG PO TABS
1.0000 | ORAL_TABLET | Freq: Two times a day (BID) | ORAL | Status: DC
  Administered 2015-05-10 – 2015-05-11 (×2): 1 via ORAL
  Filled 2015-05-10 (×3): qty 1

## 2015-05-10 MED ORDER — MORPHINE SULFATE 2 MG/ML IJ SOLN
2.0000 mg | Freq: Once | INTRAMUSCULAR | Status: AC
  Administered 2015-05-10: 2 mg via INTRAVENOUS
  Filled 2015-05-10: qty 1

## 2015-05-10 MED ORDER — AMOXICILLIN-POT CLAVULANATE 875-125 MG PO TABS: 1.0000 | ORAL_TABLET | Freq: Two times a day (BID) | ORAL | Status: DC

## 2015-05-10 NOTE — Progress Notes (Signed)
VASCULAR LAB PRELIMINARY  ARTERIAL  ABI completed:    RIGHT    LEFT    PRESSURE WAVEFORM  PRESSURE WAVEFORM  BRACHIAL 132 Triphasic BRACHIAL 135 Triphasic  DP 136 Triphasic DP 126 Triphasic  PT 137 Triphasic PT 134 Triphasic    RIGHT LEFT  ABI 1.01 0.99   ABIs and Doppler waveforms are within normal limits bilaterally at rest.  Chardai Gangemi, RVS 05/10/2015, 12:34 PM

## 2015-05-10 NOTE — Clinical Social Work Note (Signed)
Clinical Social Work Assessment  Patient Details  Name: SUBHAN HOOPES MRN: [id number] Date of Birth: [birth year]  Date of referral:  05/10/15               Reason for consult:  Facility Placement                Permission sought to share information with:    Permission granted to share information::  Yes, Verbal Permission Granted  Name::     [place]::  SNF admissions  Relationship::     Contact Information:     Housing/Transportation Living arrangements for the past 2 months:  [place] of Information:  Patient Patient Interpreter Needed:  None Criminal Activity/Legal Involvement Pertinent to Current Situation/Hospitalization:  No - Comment as needed Significant Relationships:  None Lives with:  Self Do you feel safe going back to the place where you live?  Yes (Patient will need short term rehab in order to return back home.) Need for family participation in patient care:  No (Coment) (Patient does not have any family or friends.)  Care giving concerns:  Patient is from [facility] and will need some short term rehab and nursing needs before he can return to [facility] [employer] transitional living.   Social Worker assessment / plan:  Patient is a 59 year old male who is obese and alert and oriented x3.  Patient has moments of confusion but is able to make his own decisions.  Patient states he is [employer] which is a transitional living home, and he has assistance with his medications and assistance with disability process.  Patient did not want to talk very much, but did agree to CSW to start the SNF placement process.  Patient states he is on Medicaid and is receiving assistance at [facility] to help him have a safe place for home.  Patient has been seen in the past by CSW at the clinic.  Patient has current medical concerns, and will need to go to SNF for short term rehab and wound care.  Employment status:  Disabled  (Comment on whether or not currently receiving Disability) Insurance information:  Medicaid In [place] PT Recommendations:  [place] / Referral to community resources:  [place]  Patient/Family's Response to care:  Patient in agreement to going to SNF for short term rehab and nursing wound care.  Patient/Family's Understanding of and Emotional Response to Diagnosis, Current Treatment, and Prognosis:  Patient at times is mildly confused.  Emotional Assessment Appearance:  Appears older than stated age Attitude/Demeanor/Rapport:  Lethargic Affect (typically observed):  Restless, Stable Orientation:  Oriented to Self, Oriented to Place, Oriented to  Time Alcohol / Substance use:  Alcohol Use Psych involvement (Current and /or in the community):  Yes (Comment)  Discharge Needs  Concerns to be addressed:  Discharge Planning Concerns, Lack of Support Readmission within the last 30 days:  No Current discharge risk:  Lack of support system Barriers to Discharge:  No Barriers Identified   Ross Ludwig, LCSWA 05/10/2015, 4:55 PM

## 2015-05-10 NOTE — Progress Notes (Signed)
FPTS Interim Progress Note  S: This afternoon patient complaining of 9/10 intermittent abdominal pain.  He states the pain is sharp in nature. The pain is everywhere in his abdomen, but worse in the RUQ. He states that the pain started last night after his pannus was manipulated in an attempt to place a foley. He has a good appetite, and has not noticed any association of pain with food. While patient has been having flatus, he does not believe that this pain is associated with flatus. No n/v. Patient getting lactulose   O: BP 142/74 mmHg  Pulse 80  Temp(Src) 98 F (36.7 C) (Oral)  Resp 17  Ht 5\' 10"  (1.778 m)  Wt 382 lb 12.8 oz (173.637 kg)  BMI 54.93 kg/m2  SpO2 95%  Physical Exam  General: Patient sitting up in bed, uncomfortable.  Lungs: CTAB, no increased WOB  Abdomen: BS+, tenderness to palpation in upper right quadrant, no guarding, no fluid wave, however patient is morbidly obese  A/P: Abdominal Pain- Hx of Alcoholic Cirrhosis and Chronic Pleural Effusions. No Acute Abdomen. CMP within normal limits (Concern for Gallbladder less likely) vs. Pancreatis (currently eating without issue) vs. Ascites vs. Gastritis vs Flatus  - RUQ U/S  - 2 mg IV Morphine  - Continue tramadol PRN  - Consider lipase in future  - Will continue to monitor   July Nickson Cletis Media, MD 05/10/2015, 2:40 PM PGY-1, [place] Medicine Service pager [phone]

## 2015-05-10 NOTE — Clinical Social Work Note (Addendum)
Patient has been faxed out looking for SNFs in [facility], and surrounding counties.  Patient needs a level 2 passar, 30 day note and FL2 on chart awaiting signature for physician.  Jones Broom. [place], MSW, [place] 05/10/2015 5:46 PM

## 2015-05-10 NOTE — Progress Notes (Signed)
Family Medicine Teaching Service Daily Progress Note Intern Pager: [phone]  Patient name: Matthew [name] Medical record number: [id number] Date of birth: 1956-02-05 Age: 59 y.o. Gender: male  Primary Care Provider: Junie Panning, DO Consultants: None  Code Status: Full    Pt Overview and Major Events to Date:  8/2 - admit for cellulitis 8/3 - WBC spike and transfer to SDU 8/7 - PO abx, tx to floor 8/9 -  Failed PO, continue vancomycin 8/7-8/8 - Hypoglycemia- continue to check CBGs, gets a bed time snack    Assessment and Plan: Matthew [name] is a 59 y.o. male presenting with sepsis secondary to UTI and cellulitis . PMH is significant for afib, HTN, X4JO, alcoholic cirrhosis, chronic right pleural effusion, OSA  # Sepsis 2/2 RLE cellulitis/Diabetic Foot Ulcer and Enterococcus UTI: BP improving. WBC downtrending, afebrile. RLE remains  erythematous.  No evidence of osteo on MRI , imaging showing severe cellulitis and diffuse myositis but no discrete drainable soft tissue abscess. Ortho indicates medical management. Enterococcus UTI adequately treated with vancomycin 6/6 days and Meropenem 5/5 days. Received 1 day of doxycyline on 8/7. ID consulted for failed PO antibiotics.  - Continue Unasyn - strep/ MSSA/enterococcus coverage  - Monitor vitals, stable overnight  - WBC 15.7, down trending   - Vascular imaging of leg to determine perfusion antibiotic   - Wound Care - xenform gauze, dressing on on RLE,  toenail requiring podiatry post-discharge   # Altered Mental Status - Confusion with asterixis on 8/9- 8/10 due hepatic encephalopathy 2/2 a hx of alcoholic cirrhosis. Ammonia 90 on 8/9. Normal ABG ph 7.41 pCo2 36.7 pO2 80.5. Orientated to self, time, and place this morning  - Lactulose 30 grams TID, 4x BM  - Holding on Rifaximin, patient no longer altered  - Will continue to monitor   # AKI - Improving. SCr 1.8, downtrending- baseline ~1. - Patient SCr trending up - Not on  fluids, Lasix started yesterday  - Continue to monitor, BMPs  # Chronic right pleural effusion: known since around [facility] 2015. He has undergone multiple taps (4) showing transudative process. O2 sat 97%- 100% on room air. - monitor clinically  # Afib: Stable, rate controlled, currently NSR. - Continue home aspirin - Continue Diltiazem SR 60mg  BID  # OSA: Patient continues to refuse CPAP, despite several conversations with patient about the need to use CPAP at night in order to get rest.  BP 141/94 - CPAP ordered   # Cirrhosis: likely alcoholic cirrhosis, underwent workup in 2015 with CT, negative HCV; had attempted MRI but pt was unable to fit in MRI machine. AST/ALT 46/29 on 8/10 - Continue Lasix 40 mg daily   # Alcohol abuse: Stable, no signs of withdrawal, states last drink was 2 months ago - continue to monitor, not on CIWA currently  #FEN/GI: diet carb modified, per Nutrition Ensure Enlive BID, Prostat liquid protein BID, Bed time snack 2/2 resolved hypoglycemia   Prophylaxis: heparin sq  Disposition:Continue on Med-Surg, PT/OT recommends SNF - Social work consulted   Subjective: Patient continues to improve this AM, much less confused. Not able to answers more complex questions.  He feels his leg is improving, and would really like to go home.   Denies fevers, chills, chest pain, n/v, SOB, abdominal pain.   Objective: Temp:  [97.6 F (36.4 C)-98 F (36.7 C)] 97.6 F (36.4 C) (08/11 0604) Pulse Rate:  [75-76] 76 (08/11 0604) Resp:  [17-18] 18 (08/11 0604) BP: (131-146)/(67-94) 141/94 mmHg (  08/11 0604) SpO2:  [96 %-98 %] 98 % (08/11 0604) Weight:  [382 lb 12.8 oz (173.637 kg)] 382 lb 12.8 oz (173.637 kg) (08/11 0604)  Physical Exam: General: obese, sitting up in chair eating breakfast, NAD  Cardiovascular: RRR, no m/r/g, radial pulses 2+,  Respiratory: normal work of breathing, CTAB  Abdomen: NTND, BS+ Extremities: Right thigh much less erythematous today,RLE  wrapped this AM,  no tenderness to palpation. Ulcer over base of 5th metatarsal -stable  Skin- teleangiectasia on chest, palmar erythema Neuro - Alert and orientated this AM (name, place, and date)    Laboratory:  Recent Labs Lab 05/08/15 1030 05/09/15 0511 05/10/15 0459  WBC 14.3* 16.0* 15.7*  HGB 15.0 14.7 15.5  HCT 41.8 41.4 44.0  PLT 358 393 370    Recent Labs Lab 05/04/15 0343  05/08/15 1030 05/09/15 0511 05/10/15 0459  NA 133*  < > 133* 137 140  K 4.9  < > 5.4* 5.2* 4.6  CL 105  < > 101 105 105  CO2 18*  < > 21* 22 25  BUN 43*  < > 40* 38* 31*  CREATININE 1.93*  < > 1.77* 1.72* 1.80*  CALCIUM 7.8*  < > 8.2* 8.2* 8.2*  PROT 6.3*  --   --  7.0 7.5  BILITOT 0.9  --   --  0.9 1.0  ALKPHOS 112  --   --  147* 135*  ALT 37  --   --  29 28  AST 62*  --   --  46* 42*  GLUCOSE 77  < > 89 62* 87  < > = values in this interval not displayed.  Urinalysis    Component Value Date/Time   COLORURINE BROWN* 05/02/2015 2052   APPEARANCEUR TURBID* 05/02/2015 2052   LABSPEC 1.015 05/02/2015 2052   PHURINE 5.5 05/02/2015 2052   GLUCOSEU NEGATIVE 05/02/2015 2052   HGBUR LARGE* 05/02/2015 2052   BILIRUBINUR MODERATE* 05/02/2015 2052   KETONESUR 15* 05/02/2015 2052   PROTEINUR 100* 05/02/2015 2052   UROBILINOGEN 1.0 05/02/2015 2052   NITRITE NEGATIVE 05/02/2015 2052   LEUKOCYTESUR MODERATE* 05/02/2015 2052    Microbiology  Blood Cultures - 3 x day NGTD   Urine Cultures - >=100,000 COLONIES/mL ENTEROCOCCUS SPECIES - pan sensitive  Imaging/Diagnostic Tests: Dg Chest 2 View  05/01/2015   CLINICAL DATA:  New tachypneic, leukocytosis.  Fever and chills.  EXAM: CHEST  2 VIEW  COMPARISON:  03/10/2015  FINDINGS: Small right pleural effusion is unchanged. There is right lower lobe atelectasis/ infiltrate also unchanged.  Left lung remains clear  Heart size upper normal.  Mild vascular congestion without edema.  IMPRESSION: Right pleural effusion and right lower lobe atelectasis/  infiltrate unchanged.   Electronically Signed   By: Franchot Gallo M.D.   On: 05/01/2015 15:43   US Renal  05/03/2015   CLINICAL DATA:  Acute renal insufficiency  EXAM: RENAL / URINARY TRACT ULTRASOUND COMPLETE  COMPARISON:  Ultrasound 03/10/2015  FINDINGS: Right Kidney:  Length: 13.3 cm. Anechoic cyst in the upper pole measuring 6.6 cm. No hydronephrosis.  Left Kidney:  Length: 13.1 cm.  No hydronephrosis.  Bladder:  Appears normal for degree of bladder distention.  IMPRESSION: No hydronephrosis.  Benign-appearing cyst of the RIGHT kidney   Electronically Signed   By: Suzy Bouchard M.D.   On: 05/03/2015 16:30   Mr Foot Right W Wo Contrast  05/05/2015   CLINICAL DATA:  Lateral foot ulcers and severe foot swelling.  EXAM: MRI  OF THE RIGHT FOREFOOT WITHOUT AND WITH CONTRAST  TECHNIQUE: Multiplanar, multisequence MR imaging was performed both before and after administration of intravenous contrast.  CONTRAST:  42mL MULTIHANCE GADOBENATE DIMEGLUMINE 529 MG/ML IV SOLN  COMPARISON:  None.  FINDINGS: There is marked diffuse the subcutaneous soft tissue swelling/ edema/fluid most likely reflecting severe cellulitis. No focal drainable soft tissue abscess, osteomyelitis or septic arthritis. There is mild diffuse myositis but no findings for pyomyositis.  There are degenerative changes involving the forefoot, midfoot and hindfoot/ ankle. The major tendons and ligaments appear intact.  IMPRESSION: Severe cellulitis and diffuse myositis but no discrete drainable soft tissue abscess.  No definite MR findings for septic arthritis or osteomyelitis.   Electronically Signed   By: Marijo Sanes M.D.   On: 05/05/2015 13:09    Tannen Vandezande Cletis Media, MD, MPH 05/10/2015, 9:30 AM PGY-1, [place] Intern pager: [phone], text pages welcome

## 2015-05-11 DIAGNOSIS — R1011 Right upper quadrant pain: Secondary | ICD-10-CM

## 2015-05-11 LAB — CBC
HEMATOCRIT: 42.5 % (ref 39.0–52.0)
Hemoglobin: 14.3 g/dL (ref 13.0–17.0)
MCH: 32.4 pg (ref 26.0–34.0)
MCHC: 33.6 g/dL (ref 30.0–36.0)
MCV: 96.2 fL (ref 78.0–100.0)
Platelets: 397 10*3/uL (ref 150–400)
RBC: 4.42 MIL/uL (ref 4.22–5.81)
RDW: 16.5 % — AB (ref 11.5–15.5)
WBC: 15.3 10*3/uL — AB (ref 4.0–10.5)

## 2015-05-11 LAB — COMPREHENSIVE METABOLIC PANEL
ALBUMIN: 1.7 g/dL — AB (ref 3.5–5.0)
ALK PHOS: 144 U/L — AB (ref 38–126)
ALT: 28 U/L (ref 17–63)
AST: 41 U/L (ref 15–41)
Anion gap: 8 (ref 5–15)
BUN: 27 mg/dL — ABNORMAL HIGH (ref 6–20)
CALCIUM: 8.3 mg/dL — AB (ref 8.9–10.3)
CO2: 26 mmol/L (ref 22–32)
Chloride: 104 mmol/L (ref 101–111)
Creatinine, Ser: 1.66 mg/dL — ABNORMAL HIGH (ref 0.61–1.24)
GFR calc non Af Amer: 44 mL/min — ABNORMAL LOW (ref >60)
GFR, EST AFRICAN AMERICAN: 51 mL/min — AB (ref >60)
Glucose, Bld: 93 mg/dL (ref 65–99)
POTASSIUM: 4.1 mmol/L (ref 3.5–5.1)
SODIUM: 138 mmol/L (ref 135–145)
TOTAL PROTEIN: 7.3 g/dL (ref 6.5–8.1)
Total Bilirubin: 1 mg/dL (ref 0.3–1.2)

## 2015-05-11 LAB — GLUCOSE, CAPILLARY
GLUCOSE-CAPILLARY: 89 mg/dL (ref 65–99)
Glucose-Capillary: 125 mg/dL — ABNORMAL HIGH (ref 65–99)

## 2015-05-11 MED ORDER — LACTULOSE 10 GM/15ML PO SOLN: 30.0000 g | Freq: Three times a day (TID) | ORAL | Status: DC

## 2015-05-11 MED ORDER — AMOXICILLIN-POT CLAVULANATE 875-125 MG PO TABS: 1.0000 | ORAL_TABLET | Freq: Two times a day (BID) | ORAL | Status: AC

## 2015-05-11 MED ORDER — DILTIAZEM HCL ER 90 MG PO CP12
90.0000 mg | ORAL_CAPSULE | Freq: Two times a day (BID) | ORAL | Status: DC
  Administered 2015-05-11: 90 mg via ORAL
  Filled 2015-05-11 (×2): qty 1

## 2015-05-11 NOTE — Progress Notes (Signed)
Patient refused CPAP for tonight 

## 2015-05-11 NOTE — Progress Notes (Signed)
Occupational Therapy Treatment Patient Details Name: Matthew [name] MRN: [id number] DOB: 1956/01/22 Today's Date: 05/11/2015    History of present illness 59 y.o. male admitted to [facility] on 05/01/15 for sepsis due to UTI and cellulitis.  Also, of note to have AMS, AKI, and chrrhosis (long standing).  Pt with significant PMHx of A-fib, HTN, CHF, SOB, knee arthroscopy (unknown side- not indicated in chart).     OT comments  Pt demonstrates poor safety awareness and lack of awareness to incontinence of stool. Pt becoming agitated with attempts to complete adl task with OT and terminating prematurely. Pt remains fall risk and placed on chair alarm. RN in room passing medication at end of session .    Follow Up Recommendations  SNF    Equipment Recommendations   (defer)    Recommendations for Other Services      Precautions / Restrictions Precautions Precautions: Fall       Mobility Bed Mobility               General bed mobility comments: exiting bathroom alone on arrival without DME  Transfers Overall transfer level: Needs assistance Equipment used: 1 person hand held assist Transfers: Sit to/from Stand Sit to Stand: Min assist         General transfer comment: pt reaching for therapist and atetmpting to pull on therapist due to inability to complete transfer. Pt pushing up with bil UE on chair arm rest. Pt requires rocking momentum to complete task    Balance Overall balance assessment: Needs assistance             Standing balance comment: Pt observed holding door frame, door handle , sink surface and chair to help furniture walk back to chair from bathroom. Pt railed bathroom rail to complete toilet transfer                   ADL Overall ADL's : Needs assistance/impaired             Lower Body Bathing: Total assistance           Toilet Transfer: Min Psychiatric nurse Details (indicate cue type and reason): exiting bathroom on arrival.  Note towels on the floor surrounding toilet due to inability to void into toilet  without missing. pt states "my toilet works fine at home " pt voiding bowels on chair and unaware upon standing. pt reports having diarrhea since being here. pt total (A) for peri care.          Functional mobility during ADLs: Min guard (holding onto environmental supports) General ADL Comments: Pt attempting to sit eob to fix socks like home becomes agitated due to inability to complete task and states "i am tired of this! I am done" Pt states "i am going home tomorrow so I can do it all there. i am tired of everyone telling me I can't do stuff. " Pt again educated on the need for OT to help maximize independence and the goal of helping work together so patient can get back to basic daily care. Pt becoming frustrated with attempting and terminating task.       Vision                     Perception     Praxis      Cognition   Behavior During Therapy: Restless Overall Cognitive Status: Impaired/Different from baseline Area of Impairment: Problem solving  Safety/Judgement: Decreased awareness of safety Awareness: Emergent Problem Solving: Slow processing General Comments: pt was unable to problem solve placing the pulse ox cords back together and how to check on current lunch status. Pt required mod questioning cues to help problem solve task    Extremity/Trunk Assessment               Exercises     Shoulder Instructions       General Comments      Pertinent Vitals/ Pain       Pain Assessment: No/denies pain  Home Living                                          Prior Functioning/Environment              Frequency Min 2X/week     Progress Toward Goals  OT Goals(current goals can now be found in the care plan section)  Progress towards OT goals: Progressing toward goals  Acute Rehab OT Goals Patient Stated Goal: get back to work/go  home OT Goal Formulation: With patient Time For Goal Achievement: 05/15/15 Potential to Achieve Goals: Good ADL Goals Pt Will Perform Lower Body Bathing: sit to/from stand;with adaptive equipment;with supervision;with set-up Pt Will Perform Lower Body Dressing: with adaptive equipment;sit to/from stand;with supervision;with set-up Pt Will Transfer to Toilet: ambulating;with supervision Pt Will Perform Toileting - Clothing Manipulation and hygiene: with set-up;with supervision;sit to/from stand  Plan Discharge plan remains appropriate    Co-evaluation                 End of Session     Activity Tolerance Patient tolerated treatment well   Patient Left in chair;with call bell/phone within reach;with nursing/sitter in room (placed on chair alarm due to fall risk)   Nurse Communication Mobility status;Precautions        Time: 0865-7846 OT Time Calculation (min): 12 min  Charges: OT General Charges $OT Visit: 1 Procedure OT Treatments $Self Care/Home Management : 8-22 mins  Parke Poisson B 05/11/2015, 10:55 AM   Jeri Modena   OTR/L Pager: [phone] Office: [phone] .

## 2015-05-11 NOTE — Progress Notes (Signed)
Family Medicine Teaching Service Daily Progress Note Intern Pager: [phone]  Patient name: Matthew [name] Medical record number: [id number] Date of birth: [birth year] Age: 59 y.o. Gender: male  Primary Care Provider: Junie Panning, DO Consultants: None  Code Status: Full    Pt Overview and Major Events to Date:  8/2 - admit for cellulitis 8/3 - WBC spike and transfer to SDU 8/7 - PO abx, tx to floor 8/9 -  Failed PO, continue vancomycin 8/7-8/8 - Hypoglycemia- continue to check CBGs, gets a bed time snack    Assessment and Plan: MINER KORAL is a 59 y.o. male presenting with sepsis secondary to UTI and cellulitis . PMH is significant for afib, HTN, L9FX, alcoholic cirrhosis, chronic right pleural effusion, OSA  # Sepsis 2/2 RLE cellulitis/Diabetic Foot Ulcer and Enterococcus UTI: BP improving. WBC downtrending, afebrile. RLE remains  erythematous.  No evidence of osteo on MRI , imaging showing severe cellulitis and diffuse myositis but no discrete drainable soft tissue abscess. Ortho indicates medical management. Enterococcus UTI adequately treated with vancomycin 6/6 days and Meropenem 5/5 days. Received 1 day of doxycyline on 8/7. ID consulted for failed PO antibiotics.  - Continue  Augmentin PO 7/24 days - strep/ MSSA/enterococcus coverage  - Monitor vitals, stable overnight   - WBC 15.3, down trending   - Vascular imaging - ABI, Right 1.01/ Left 0.99  - Wound Care - xenform gauze, dressing on on RLE,  toenail requiring podiatry post-discharge   # Altered Mental Status - Confusion with asterixis on 8/9- 8/10 due hepatic encephalopathy 2/2 a hx of alcoholic cirrhosis. Ammonia 90 on 8/9. Normal ABG ph 7.41 pCo2 36.7 pO2 80.5. Orientated to self, time, and place this morning  - Lactulose 30 grams TID, 1x BM  - Holding on Rifaximin, patient no longer altered  - Will continue to monitor   # Right Upper Quadrant Abdominal Pain. Alkaline Phospate 144 (slightly elevated).RUQ  Ultrasound- hepatic steatosis, Portions of the common bile duct are obscured by gas. No gallbladder pathology is seen. Note that there is overlying gas which makes evaluation of the gallbladder somewhat less than optimal on this study. - Continue monitor   # AKI - Improving. SCr 1.66, downtrending- baseline ~1. Continue lasix   - Continue to monitor, BMPs  # Chronic right pleural effusion: known since around [facility] 2015. He has undergone multiple taps (4) showing transudative process. O2 sat 97%- 100% on room air. - monitor clinically  # Afib: Stable, rate controlled, currently NSR. - Continue home aspirin - Increase Diltiazem SR 90 mg BID- home dose  # OSA: Patient continues to refuse CPAP, despite several conversations with patient about the need to use CPAP at night in order to get rest.  BP 141/94 - CPAP ordered   # Cirrhosis: likely alcoholic cirrhosis, underwent workup in 2015 with CT, negative HCV; had attempted MRI but pt was unable to fit in MRI machine. AST/ALT 46/29 on 8/10 - Continue Lasix 40 mg daily   # Alcohol abuse: Stable, no signs of withdrawal, states last drink was 2 months ago - continue to monitor, not on CIWA currently  #FEN/GI: diet carb modified, per Nutrition Ensure Enlive BID, Prostat liquid protein BID, Bed time snack 2/2 resolved hypoglycemia    Prophylaxis: heparin sq  Disposition:Continue on Med-Surg, PT/OT recommends SNF - Social work consulted   Subjective: Patient doing well this morning. We discussed possible SNF placement due to patient's need for PT rehab. He states that he does not feel  that he needs this, he was up walking around. Denies any abdominal pain, thinks he just had flatus yesterday.   Objective: Temp:  [97.5 F (36.4 C)-98.1 F (36.7 C)] 97.5 F (36.4 C) (08/12 0533) Pulse Rate:  [74-86] 83 (08/12 0533) Resp:  [17-18] 18 (08/12 0533) BP: (135-142)/(69-91) 135/79 mmHg (08/12 0533) SpO2:  [95 %-100 %] 100 % (08/12  0533) Weight:  [385 lb 9.4 oz (174.9 kg)] 385 lb 9.4 oz (174.9 kg) (08/12 0533)  Physical Exam: General: obese, sitting up in chair, NAD  Cardiovascular: RRR, no m/r/g, radial pulses 2+,  Respiratory: normal work of breathing, CTAB  Abdomen: NTND, BS+ Extremities: Right thigh improving,RLE wrapped this AM, much less warm,  no tenderness to palpation. Ulcer over base of 5th metatarsal -stable  Skin- teleangiectasia on chest, palmar erythema Neuro - Alert and orientated this AM (name, place, and date)    Laboratory:  Recent Labs Lab 05/09/15 0511 05/10/15 0459 05/11/15 0422  WBC 16.0* 15.7* 15.3*  HGB 14.7 15.5 14.3  HCT 41.4 44.0 42.5  PLT 393 370 397    Recent Labs Lab 05/09/15 0511 05/10/15 0459 05/11/15 0422  NA 137 140 138  K 5.2* 4.6 4.1  CL 105 105 104  CO2 22 25 26   BUN 38* 31* 27*  CREATININE 1.72* 1.80* 1.66*  CALCIUM 8.2* 8.2* 8.3*  PROT 7.0 7.5 7.3  BILITOT 0.9 1.0 1.0  ALKPHOS 147* 135* 144*  ALT 29 28 28   AST 46* 42* 41  GLUCOSE 62* 87 93    Urinalysis    Component Value Date/Time   COLORURINE BROWN* 05/02/2015 2052   APPEARANCEUR TURBID* 05/02/2015 2052   LABSPEC 1.015 05/02/2015 2052   PHURINE 5.5 05/02/2015 2052   GLUCOSEU NEGATIVE 05/02/2015 2052   HGBUR LARGE* 05/02/2015 2052   BILIRUBINUR MODERATE* 05/02/2015 2052   KETONESUR 15* 05/02/2015 2052   PROTEINUR 100* 05/02/2015 2052   UROBILINOGEN 1.0 05/02/2015 2052   NITRITE NEGATIVE 05/02/2015 2052   LEUKOCYTESUR MODERATE* 05/02/2015 2052    Microbiology  Blood Cultures - 3 x day NGTD   Urine Cultures - >=100,000 COLONIES/mL ENTEROCOCCUS SPECIES - pan sensitive  Imaging/Diagnostic Tests: Dg Chest 2 View  05/01/2015   CLINICAL DATA:  New tachypneic, leukocytosis.  Fever and chills.  EXAM: CHEST  2 VIEW  COMPARISON:  03/10/2015  FINDINGS: Small right pleural effusion is unchanged. There is right lower lobe atelectasis/ infiltrate also unchanged.  Left lung remains clear  Heart size  upper normal.  Mild vascular congestion without edema.  IMPRESSION: Right pleural effusion and right lower lobe atelectasis/ infiltrate unchanged.   Electronically Signed   By: Franchot Gallo M.D.   On: 05/01/2015 15:43   US Renal  05/03/2015   CLINICAL DATA:  Acute renal insufficiency  EXAM: RENAL / URINARY TRACT ULTRASOUND COMPLETE  COMPARISON:  Ultrasound 03/10/2015  FINDINGS: Right Kidney:  Length: 13.3 cm. Anechoic cyst in the upper pole measuring 6.6 cm. No hydronephrosis.  Left Kidney:  Length: 13.1 cm.  No hydronephrosis.  Bladder:  Appears normal for degree of bladder distention.  IMPRESSION: No hydronephrosis.  Benign-appearing cyst of the RIGHT kidney   Electronically Signed   By: Suzy Bouchard M.D.   On: 05/03/2015 16:30   Mr Foot Right W Wo Contrast  05/05/2015   CLINICAL DATA:  Lateral foot ulcers and severe foot swelling.  EXAM: MRI OF THE RIGHT FOREFOOT WITHOUT AND WITH CONTRAST  TECHNIQUE: Multiplanar, multisequence MR imaging was performed both before and after administration  of intravenous contrast.  CONTRAST:  31mL MULTIHANCE GADOBENATE DIMEGLUMINE 529 MG/ML IV SOLN  COMPARISON:  None.  FINDINGS: There is marked diffuse the subcutaneous soft tissue swelling/ edema/fluid most likely reflecting severe cellulitis. No focal drainable soft tissue abscess, osteomyelitis or septic arthritis. There is mild diffuse myositis but no findings for pyomyositis.  There are degenerative changes involving the forefoot, midfoot and hindfoot/ ankle. The major tendons and ligaments appear intact.  IMPRESSION: Severe cellulitis and diffuse myositis but no discrete drainable soft tissue abscess.  No definite MR findings for septic arthritis or osteomyelitis.   Electronically Signed   By: Marijo Sanes M.D.   On: 05/05/2015 13:09   US Abdomen Limited Ruq  05/11/2015   CLINICAL DATA:  Right upper quadrant pain  EXAM: US ABDOMEN LIMITED - RIGHT UPPER QUADRANT  COMPARISON:  None.  FINDINGS: Gallbladder:  No  gallstones or wall thickening visualized. No pericholecystic fluid. No sonographic Murphy sign noted.  Common bile duct:  Diameter: 5 mm. No appreciable intrahepatic or extrahepatic biliary duct dilatation. Portions of the common bile duct are obscured by gas.  Liver:  No focal lesion identified. Liver echogenicity is increased diffusely.  IMPRESSION: Increased liver echogenicity, a finding most likely due to hepatic steatosis. While no focal liver lesions are identified, it must be cautioned that the sensitivity of ultrasound for focal liver lesions is diminished in this circumstance.  Portions of the common bile duct are obscured by gas. Visualized portion of the common bile duct is not enlarged.  No gallbladder pathology is seen. Note that there is overlying gas which makes evaluation of the gallbladder somewhat less than optimal on this study.   Electronically Signed   By: Lowella Grip III M.D.   On: 05/11/2015 00:13    Weslee Fogg Cletis Media, MD, MPH 05/11/2015, 7:21 AM PGY-1, [place] Intern pager: [phone], text pages welcome

## 2015-05-11 NOTE — Care Management Note (Signed)
Case Management Note  Patient Details  Name: Matthew [name] MRN: [id number] Date of Birth: 20-Sep-1956  Subjective/Objective:            Pt admitted with cellulitis of right leg        Action/Plan:  Pt is from group home, CSW consulted.  PT recommended SNF, pt refused.  Pt will discharge back to [facility] per CSW.   Expected Discharge Date:   (unknown)               Expected Discharge Plan:  Homeless Shelter  In-House Referral:  Clinical Social Work  Discharge planning Services  CM Consult  Post Acute Care Choice:    Choice offered to:     DME Arranged:  Bariatric Conservation officer, nature DME Agency:  [place]:  RN for disease managment HH Agency:  [facility]  Status of Service:  Completed, signed off  Medicare Important Message Given:    Date Medicare IM Given:    Medicare IM give by:    Date Additional Medicare IM Given:    Additional Medicare Important Message give by:     If discussed at [place] of Stay Meetings, dates discussed:    Additional Comments: CM offered choice, pt chose [facility], agency contacted.  CM informed that pts insurance will not cover [place] ordered PT nor OT.  MD contacted and made aware.   MD ordered RN for disease management and bariatric rolling walker, agency contacted and referral was accepted.  Agency made aware of pts correct address:  [facility] [place] [address] RN at facility [place] [phone] Facility main number [phone]  Maryclare Labrador, RN 05/11/2015, 2:52 PM

## 2015-05-11 NOTE — Progress Notes (Signed)
PT Cancellation Note  Patient Details Name: GEARY RUFO MRN: [id number] DOB: [birth year]   Cancelled Treatment:    Reason Eval/Treat Not Completed: Fatigue/lethargy limiting ability to participate. Pt requested PT check back another day.    Weston Anna, MPT Pager: [phone]

## 2015-05-11 NOTE — Clinical Social Work Note (Addendum)
CSW spoke to [facility] nurse manager Thurnell Garbe [phone] who said that patient can return today, and can receive home health at group home.  Patient stated he does not want to go to SNF and just wants to return back to [facility].  Nurse manager stated he can pick patient up once he is ready for discharge and to have someone contact him.  CSW gave patient's bed side nurse contact information to contact [facility] once patient's discharge orders have been received.  Patient was informed that he can return to group home today, patient stated he was relieved that he can return and would like home health, case manager was notified.  CSW to sign off please reconsult if other social work needs arise.  Jones Broom. [place], MSW, [place] 05/11/2015 3:40 PM

## 2015-05-11 NOTE — Discharge Instructions (Signed)
You were admitted for a urinary tract infection and for cellulitis of your right extremity. You were started on antibiotics for this infection. You will need to take Augmentin 875 mg PO once at nigh and once in the morning until 8/23. You are also going to need to start taking lactulose three times a day. This will help stop confusion that can occur due to the effects of your liver disease.    Cellulitis Cellulitis is an infection of the skin and the tissue beneath it. The infected area is usually red and tender. Cellulitis occurs most often in the arms and lower legs.  CAUSES  Cellulitis is caused by bacteria that enter the skin through cracks or cuts in the skin. The most common types of bacteria that cause cellulitis are staphylococci and streptococci. SIGNS AND SYMPTOMS   Redness and warmth.  Swelling.  Tenderness or pain.  Fever. DIAGNOSIS  Your health care provider can usually determine what is wrong based on a physical exam. Blood tests may also be done. TREATMENT  Treatment usually involves taking an antibiotic medicine. HOME CARE INSTRUCTIONS   Take your antibiotic medicine as directed by your health care provider. Finish the antibiotic even if you start to feel better.  Keep the infected arm or leg elevated to reduce swelling.  Apply a warm cloth to the affected area up to 4 times per day to relieve pain.  Take medicines only as directed by your health care provider.  Keep all follow-up visits as directed by your health care provider. SEEK MEDICAL CARE IF:   You notice red streaks coming from the infected area.  Your red area gets larger or turns dark in color.  Your bone or joint underneath the infected area becomes painful after the skin has healed.  Your infection returns in the same area or another area.  You notice a swollen bump in the infected area.  You develop new symptoms.  You have a fever. SEEK IMMEDIATE MEDICAL CARE IF:   You feel very  sleepy.  You develop vomiting or diarrhea.  You have a general ill feeling (malaise) with muscle aches and pains. MAKE SURE YOU:   Understand these instructions.  Will watch your condition.  Will get help right away if you are not doing well or get worse. Document Released: 06/25/2005 Document Revised: 01/30/2014 Document Reviewed: 12/01/2011 [facility] Patient Information 2015 [place], [place]. This information is not intended to replace advice given to you by your health care provider. Make sure you discuss any questions you have with your health care provider.

## 2015-05-11 NOTE — Progress Notes (Signed)
Pt DC. Was provided with instructions, education, and followup appt. Transferred outside via [facility] by RN

## 2015-05-11 NOTE — Discharge Summary (Signed)
[facility] Discharge Summary  Patient name: Matthew [name] Medical record number: [id number] Date of birth: 06-27-1956 Age: 59 y.o. Gender: male Date of Admission: 05/01/2015  Date of Discharge: 05/11/2015 Admitting Physician: Zenia Resides, MD  Primary Care Provider: Junie Panning, DO Consultants: ID   Indication for Hospitalization: Sepsis 2/2 Right Lower Extremity Cellulitis and Enterococcus UTI   Discharge Diagnoses/Problem List:  Sepsis 2/2 RLE Cellulitis and Enterococcus UTI  Hepatic Encephalopathy  AKI  Chronic Pleural effusions Atrial fibrillation  Cirrhosis  OSA   Disposition: [place]  Discharge Condition: Good, stable   Discharge Exam:  Physical Exam: General: obese, sitting up in chair, NAD  Cardiovascular: RRR, no m/r/g, radial pulses 2+,  Respiratory: normal work of breathing, CTAB  Abdomen: NTND, BS+ Extremities: Right thigh improving,RLE wrapped this AM, much less warm, no tenderness to palpation. Ulcer over base of 5th metatarsal -stable  Skin- teleangiectasia on chest, palmar erythema Neuro - Alert and orientated this AM (name, place, and date)   Brief Hospital Course:  Matthew [name] was admitted to [facility] for sepsis due to right lower extremity cellulitis and enteroccocus UTI. On admission his WBC 29.5. He was tachycardic and febrile. His RLE is edematous, erythematous , warm compared to the left. He also was complaining of dysuria. Patient was started on broad spectrum antibiotics, vancomycin and meropenum for 5/5 days. Lactic acid trend up to 4.2 and WBC 44. Patient was found to have right foot diabetic ulcer which was eschar over, an investigation of possible osteomyelitis was consider. However MRI was negative for osteo, signifcant for severe cellulitis and diffuse myositis. Patient was transitioned to doxycycline, failed, and was restarted on vancomycin. ID was consulted, indicated this was  like and strep pyogenes infection and recommended Unasyn. Patient was then transitioned to Augmentin to complete a total of 3 weeks of antibiotics with stop date being August 23rd.   Patient had an AKI on admission with SCr 2.19, this was likely prerenal as patient was initial hypotensive due to sepsis. Over the period of his stay, patient's SCr improved with hydration and control of his infection. At discharge, SCr was 1.66 trend down towards baseline of 1.    During stay, on 8/9 patient was acting confused. He was orientated to only self and place. Patient had been refusing is CPAP at night and had been increasingly tired so there was concern for hypercapnia (due to OSA), however patient ABG was wnl ( ph 7.41 pCo2 36.7 pO2 80.5 ). Hepatic encephalopathy due to hx of cirrhosis was considered and patient was found to have an ammonia 90 as well as asterixis on exam. Patient was started on Lactulose TID on 8/10. Patient's confusion and asterixis completely resolved by 8/11. Patient was continued on lactulose TID as an outpatient.   Disposition- PT/OT recommended SNF placement, where patient could receive PT/OT rehab. However patient was not amenable to this, and indicated that he wanted to return to the [facility] home. Home health nursing services were provided upon discharge for follow up with cellulitis and patient was discharged with a Bariatric Rolling Walker.   Issues for Follow Up:  1. F/U on patient's cellulitis, patient to be on Augmentin until 8/23  2. F/U on patient's hepatic encephalopathy, patient started on lactulose during admission  3. F/U on home health nursing services, any issues with this.  4. Recheck BMP and CBC to insure continue downtrend of SCr and WBC count   Significant Procedures: None  Significant Labs and Imaging:   Recent Labs Lab 05/09/15 0511 05/10/15 0459 05/11/15 0422  WBC 16.0* 15.7* 15.3*  HGB 14.7 15.5 14.3  HCT 41.4 44.0 42.5  PLT 393 370 397    Recent  Labs Lab 05/07/15 0332 05/08/15 1030 05/09/15 0511 05/10/15 0459 05/11/15 0422  NA 130* 133* 137 140 138  K 5.4* 5.4* 5.2* 4.6 4.1  CL 98* 101 105 105 104  CO2 22 21* 22 25 26   GLUCOSE 82 89 62* 87 93  BUN 33* 40* 38* 31* 27*  CREATININE 1.43* 1.77* 1.72* 1.80* 1.66*  CALCIUM 8.2* 8.2* 8.2* 8.2* 8.3*  ALKPHOS  --   --  147* 135* 144*  AST  --   --  46* 42* 41  ALT  --   --  29 28 28   ALBUMIN  --   --  1.9* 1.8* 1.7*    Dg Chest 2 View  05/01/2015   CLINICAL DATA:  New tachypneic, leukocytosis.  Fever and chills.  EXAM: CHEST  2 VIEW  COMPARISON:  03/10/2015  FINDINGS: Small right pleural effusion is unchanged. There is right lower lobe atelectasis/ infiltrate also unchanged.  Left lung remains clear  Heart size upper normal.  Mild vascular congestion without edema.  IMPRESSION: Right pleural effusion and right lower lobe atelectasis/ infiltrate unchanged.   Electronically Signed   By: Franchot Gallo M.D.   On: 05/01/2015 15:43   US Renal  05/03/2015   CLINICAL DATA:  Acute renal insufficiency  EXAM: RENAL / URINARY TRACT ULTRASOUND COMPLETE  COMPARISON:  Ultrasound 03/10/2015  FINDINGS: Right Kidney:  Length: 13.3 cm. Anechoic cyst in the upper pole measuring 6.6 cm. No hydronephrosis.  Left Kidney:  Length: 13.1 cm.  No hydronephrosis.  Bladder:  Appears normal for degree of bladder distention.  IMPRESSION: No hydronephrosis.  Benign-appearing cyst of the RIGHT kidney   Electronically Signed   By: Suzy Bouchard M.D.   On: 05/03/2015 16:30   Mr Foot Right W Wo Contrast  05/05/2015   CLINICAL DATA:  Lateral foot ulcers and severe foot swelling.  EXAM: MRI OF THE RIGHT FOREFOOT WITHOUT AND WITH CONTRAST  TECHNIQUE: Multiplanar, multisequence MR imaging was performed both before and after administration of intravenous contrast.  CONTRAST:  94mL MULTIHANCE GADOBENATE DIMEGLUMINE 529 MG/ML IV SOLN  COMPARISON:  None.  FINDINGS: There is marked diffuse the subcutaneous soft tissue swelling/  edema/fluid most likely reflecting severe cellulitis. No focal drainable soft tissue abscess, osteomyelitis or septic arthritis. There is mild diffuse myositis but no findings for pyomyositis.  There are degenerative changes involving the forefoot, midfoot and hindfoot/ ankle. The major tendons and ligaments appear intact.  IMPRESSION: Severe cellulitis and diffuse myositis but no discrete drainable soft tissue abscess.  No definite MR findings for septic arthritis or osteomyelitis.   Electronically Signed   By: Marijo Sanes M.D.   On: 05/05/2015 13:09   US Abdomen Limited Ruq  05/11/2015   CLINICAL DATA:  Right upper quadrant pain  EXAM: US ABDOMEN LIMITED - RIGHT UPPER QUADRANT  COMPARISON:  None.  FINDINGS: Gallbladder:  No gallstones or wall thickening visualized. No pericholecystic fluid. No sonographic Murphy sign noted.  Common bile duct:  Diameter: 5 mm. No appreciable intrahepatic or extrahepatic biliary duct dilatation. Portions of the common bile duct are obscured by gas.  Liver:  No focal lesion identified. Liver echogenicity is increased diffusely.  IMPRESSION: Increased liver echogenicity, a finding most likely due to hepatic steatosis. While no focal  liver lesions are identified, it must be cautioned that the sensitivity of ultrasound for focal liver lesions is diminished in this circumstance.  Portions of the common bile duct are obscured by gas. Visualized portion of the common bile duct is not enlarged.  No gallbladder pathology is seen. Note that there is overlying gas which makes evaluation of the gallbladder somewhat less than optimal on this study.   Electronically Signed   By: Lowella Grip III M.D.   On: 05/11/2015 00:13     Results/Tests Pending at Time of Discharge: None   Discharge Medications:    Medication List    TAKE these medications        amoxicillin-clavulanate 875-125 MG per tablet  Commonly known as:  AUGMENTIN  Take 1 tablet by mouth every 12 (twelve)  hours.     aspirin 81 MG EC tablet  Take 1 tablet (81 mg total) by mouth daily.     CENTRUM SILVER ADULT 50+ Tabs  Take 125 mg by mouth daily.     diltiazem 180 MG 24 hr capsule  Commonly known as:  CARDIZEM CD  TAKE 1 CAPSULE (180 MG TOTAL) BY MOUTH DAILY.     folic acid 1 MG tablet  Commonly known as:  FOLVITE  Take 1 tablet (1 mg total) by mouth daily.     furosemide 40 MG tablet  Commonly known as:  LASIX  Take 1 tablet (40 mg total) by mouth daily.     ketoconazole 2 % cream  Commonly known as:  NIZORAL  Apply 1 application topically daily as needed for irritation.     lactulose 10 GM/15ML solution  Commonly known as:  CHRONULAC  Take 45 mLs (30 g total) by mouth 3 (three) times daily.     spironolactone 50 MG tablet  Commonly known as:  ALDACTONE  Take 2 tablets (100 mg total) by mouth daily.     thiamine 100 MG tablet  Take 1 tablet (100 mg total) by mouth daily.        Discharge Instructions: Please refer to Patient Instructions section of EMR for full details.  Patient was counseled important signs and symptoms that should prompt return to medical care, changes in medications, dietary instructions, activity restrictions, and follow up appointments.   Follow-Up Appointments:     Follow-up Information    Follow up with [facility], DO. Go on 05/14/2015.   Specialty:  Family Medicine   Why:  @ 3:45- Hospital follow-up   Contact information:   [address] [place] [address] [phone]       Asiyah Cletis Media, MD 05/11/2015, 2:51 PM PGY-1, [place]

## 2015-05-14 ENCOUNTER — Ambulatory Visit (INDEPENDENT_AMBULATORY_CARE_PROVIDER_SITE_OTHER): Payer: Medicaid Other | Admitting: Family Medicine

## 2015-05-14 VITALS — BP 119/72 | HR 70 | Temp 98.2°F | Ht 70.0 in | Wt 384.6 lb

## 2015-05-14 DIAGNOSIS — F329 Major depressive disorder, single episode, unspecified: Secondary | ICD-10-CM

## 2015-05-14 DIAGNOSIS — N179 Acute kidney failure, unspecified: Secondary | ICD-10-CM | POA: Diagnosis not present

## 2015-05-14 DIAGNOSIS — L03115 Cellulitis of right lower limb: Secondary | ICD-10-CM

## 2015-05-14 DIAGNOSIS — F32A Depression, unspecified: Secondary | ICD-10-CM

## 2015-05-14 MED ORDER — XEROFORM PETROLAT GAUZE 5"X9" EX MISC
CUTANEOUS | Status: DC
Start: 2015-05-14 — End: 2016-05-17

## 2015-05-14 MED ORDER — CITALOPRAM HYDROBROMIDE 20 MG PO TABS: 20.0000 mg | ORAL_TABLET | Freq: Every day | ORAL | Status: DC

## 2015-05-14 MED ORDER — KERLIX BANDAGE ROLL MISC: Status: DC

## 2015-05-14 NOTE — Patient Instructions (Signed)
Please continue to take your antibiotics as indicated. We put in a prescription for dressing for your changes and for a wound care referral. We have also arranged for you to have a urine hat. Your PHQ-9 improved from 10 to 7, which is considered mild depression. We checked your CBC (for the infection cells) and a BMP (to check out your kidneys and potassium). I will let you when these results are back!  Please let me know if there's anything else I can do for you! I'm glad you are doing so well!  Dr. Gerlean Ren

## 2015-05-15 ENCOUNTER — Telehealth: Payer: Self-pay | Admitting: Family Medicine

## 2015-05-15 DIAGNOSIS — N179 Acute kidney failure, unspecified: Secondary | ICD-10-CM

## 2015-05-15 LAB — BASIC METABOLIC PANEL
BUN: 24 mg/dL (ref 7–25)
CALCIUM: 8.7 mg/dL (ref 8.6–10.3)
CO2: 28 mmol/L (ref 20–31)
CREATININE: 1.82 mg/dL — AB (ref 0.70–1.33)
Chloride: 101 mmol/L (ref 98–110)
Glucose, Bld: 92 mg/dL (ref 65–99)
Potassium: 4.1 mmol/L (ref 3.5–5.3)
Sodium: 137 mmol/L (ref 135–146)

## 2015-05-15 LAB — CBC
HCT: 37.8 % — ABNORMAL LOW (ref 39.0–52.0)
Hemoglobin: 13.3 g/dL (ref 13.0–17.0)
MCH: 33.2 pg (ref 26.0–34.0)
MCHC: 35.2 g/dL (ref 30.0–36.0)
MCV: 94.3 fL (ref 78.0–100.0)
MPV: 10.6 fL (ref 8.6–12.4)
PLATELETS: 290 10*3/uL (ref 150–400)
RBC: 4.01 MIL/uL — AB (ref 4.22–5.81)
RDW: 14.9 % (ref 11.5–15.5)
WBC: 15.1 10*3/uL — AB (ref 4.0–10.5)

## 2015-05-15 NOTE — Telephone Encounter (Signed)
Contacted facility concerning lab results. Slight increase in creatinine from 1.66 to 1.82. WBC stable at 15.1. Currently on Lasix 40mg  and Spironolactone 100mg , which could be contributing to renal injury. Will continue current medications at this time and recheck BMP next week. If creatinine continues to rise, will half medications to Lasix 20mg  and Spironolactone 50mg . Will be cautious with cutting back or discontinuing medication given how easily Matthew [name] becomes fluid overloaded.

## 2015-05-18 NOTE — Assessment & Plan Note (Signed)
Will recheck CMP today.  

## 2015-05-18 NOTE — Assessment & Plan Note (Addendum)
-   PHQ9 improved to score of 7 from 10 in June - Gave Matthew [name] option to continue to see if improves or starting on anti-depressant. Weighed options and decided to try antidepressant for at least 6 weeks. - Initiated Celexa. Will recheck PHQ9 in 6 weeks

## 2015-05-18 NOTE — Assessment & Plan Note (Signed)
-   Wound consult placed. Orders placed for materials needed to dress leg - Will obtain CBC today to monitor WBC - Continue course of Augmentin - Follow up in 2-3 weeks if unable to follow with Wound Care in that time. Otherwise, return in 6 weeks.

## 2015-05-18 NOTE — Progress Notes (Signed)
Subjective:     Patient ID: Matthew [name], male   DOB: 1956/02/26, 59 y.o.   MRN: [id number]  HPI Matthew [name] is a 59yo male presenting for hospital follow up. # Hospital Summary: - Initially presented on 8/2 following abrupt onset of fever and chills and right leg cellulitis. Initiated on Vancomycin and Ceftriaxone. Noted to be hypotensive. - On 8/3 was transitioned to Vancomycin and Meropenum. Also noted to have AKI. - On 8/4 as noted to have UTI. Urinalysis on admission unimpressive. Repeat urinalysis showed many bacteria and moderate leukocytes. Urine culture obtained and grew Enterococcus. - MRI obtained on 8/6 and showed no signs of osteomyelitis but did show severe cellulitis and diffuse myositis - Attempted to transition to Doxycycline on 8/7, but failed transition and was restarted on Vancomycin. ID contacted and recommended transition to Unasyn instead for possible Strep pyogenes infection. Transitioned to Augmentin on 8/9 to complete three week course to end on 05/22/15. - Became disoriented on 8/9, suspected to be secondary to history of cirrhosis. Ammonia 90 and asterixis noted on exam. Started on Lactulose TID on 8/10. - Discharged on 8/12 to complete course of Augmentin. Creatinine improved to 1.66 at discharge.   #Progres Since Discharge: - Presents with nurse from [facility] he has been doing well since discharge. Cellulitis has continued to improve on oral antibiotics - Continues to take Augmentin as prescribed - Concerned about possible cellulitis under pannus. States redness, tenderness, and warmth has not changed, he's just paranoid about getting something under his pannus - Denies fevers - Has been eating well at [facility] Nurse feels that he is eating his feeling. Would like to consider starting anti-depressant - Has been going AA meetings - Requests urine hat. Has to do occasional drug testing at [facility] and he is unable to pee  into cup they give him - Requests Wound Care Consult. Was given nursing visits to help with dressing changes, however they are only able to come out for three total visits and he continues to need dressing changes.  - Denies shortness of breath or chest pain. Denies fever.   Review of Systems  Constitutional: Negative for fever.  Respiratory: Negative for shortness of breath.   Cardiovascular: Negative for chest pain.  Skin: Positive for rash.       Objective:   Physical Exam  Constitutional: He appears well-developed and well-nourished. No distress.  Cardiovascular: Normal rate and regular rhythm.  Exam reveals no gallop and no friction rub.   No murmur heard. Pulmonary/Chest: Effort normal. No respiratory distress. He has no wheezes. He has no rales.  Decreased air movement on right consistent with prior  Abdominal: Soft. He exhibits no distension. There is no tenderness.  Obese  Musculoskeletal: He exhibits edema.  Ambulating with walker  Skin:  Cellulitis noted on right lower extremity improving from previous boundary markings placed in hospital. Dressing in place. No cellulitis noted under pannus, moist and raw       Assessment:     Please refer to Problem List for Assessment.    Plan:     Please refer to Problem List for Plan.

## 2015-05-22 ENCOUNTER — Telehealth: Payer: Self-pay | Admitting: *Deleted

## 2015-05-22 MED ORDER — KETOCONAZOLE 2 % EX CREA: 1.0000 "application " | TOPICAL_CREAM | Freq: Every day | CUTANEOUS | Status: DC | PRN

## 2015-05-22 NOTE — Telephone Encounter (Signed)
Bethany for [facility] calling, states patient need refill on Ketaconazole cream. Has been using it on abd. and leg, wants to know if a larger quantity can be prescribed.

## 2015-05-22 NOTE — Telephone Encounter (Signed)
Prescription sent to pharmacy.

## 2015-05-22 NOTE — Telephone Encounter (Signed)
[facility] calling will be placing una boot for patient until he is seen at the Wound care center next week, since the Kerlix they have been wrapping leg with keeps falling off. FYI to PCP

## 2015-05-30 ENCOUNTER — Encounter (HOSPITAL_BASED_OUTPATIENT_CLINIC_OR_DEPARTMENT_OTHER): Payer: Medicaid Other

## 2015-06-05 DIAGNOSIS — R1011 Right upper quadrant pain: Secondary | ICD-10-CM | POA: Insufficient documentation

## 2015-06-13 ENCOUNTER — Other Ambulatory Visit: Payer: Self-pay | Admitting: Family Medicine

## 2015-06-13 DIAGNOSIS — L03115 Cellulitis of right lower limb: Secondary | ICD-10-CM

## 2015-06-13 NOTE — Telephone Encounter (Signed)
Pt called because he said the [facility] has been calling us for refill approval on his Furosemide and Thiamine. jw

## 2015-06-14 ENCOUNTER — Telehealth: Payer: Self-pay | Admitting: Family Medicine

## 2015-06-14 ENCOUNTER — Other Ambulatory Visit: Payer: Self-pay | Admitting: *Deleted

## 2015-06-14 MED ORDER — FUROSEMIDE 40 MG PO TABS: 40.0000 mg | ORAL_TABLET | Freq: Every day | ORAL | Status: DC

## 2015-06-14 MED ORDER — DILTIAZEM HCL ER COATED BEADS 180 MG PO CP24: ORAL_CAPSULE | ORAL | Status: DC

## 2015-06-14 MED ORDER — THIAMINE HCL 100 MG PO TABS: 100.0000 mg | ORAL_TABLET | Freq: Every day | ORAL | Status: DC

## 2015-06-14 NOTE — Telephone Encounter (Signed)
Prescriptions sent to pharmacy. Note that no requests for refills were received prior to today.

## 2015-06-14 NOTE — Telephone Encounter (Signed)
Pt called about his refills.  Is out of his meds

## 2015-06-14 NOTE — Telephone Encounter (Signed)
Pt calling once more, and he states that he is in need of diltiazem and furosemide. He states that the pharmacy was supposed to send the request on Monday and that we "have had more than 48 hours". Pt states that he is out of his meds and also says that "this is unlike Dr. Gerlean Ren". He expresses frustration to a high degree and demanded for me to put Dr. Andria Frames on the phone. I explained that I would be happy to send this message on and he responded with "I don't want this electronic crap, I would like for you to go get Dr. Andria Frames or put him on the phone." When I explained that I will process his request to the best of my ability but Dr. Andria Frames is unavailable at the moment he states "Wait until I see him again."  Sending to RN Team as well as [employer]. Thank you, Fonda Kinder, ASA

## 2015-06-14 NOTE — Telephone Encounter (Signed)
Pt called back because the pharmacy said that they have not received any request from [place]. He is now out of medication. jw

## 2015-06-15 NOTE — Telephone Encounter (Signed)
error 

## 2015-06-19 ENCOUNTER — Encounter (HOSPITAL_BASED_OUTPATIENT_CLINIC_OR_DEPARTMENT_OTHER): Payer: Medicaid Other | Attending: General Surgery

## 2015-06-19 DIAGNOSIS — G4730 Sleep apnea, unspecified: Secondary | ICD-10-CM | POA: Insufficient documentation

## 2015-06-19 DIAGNOSIS — I10 Essential (primary) hypertension: Secondary | ICD-10-CM | POA: Insufficient documentation

## 2015-06-19 DIAGNOSIS — K7030 Alcoholic cirrhosis of liver without ascites: Secondary | ICD-10-CM | POA: Diagnosis not present

## 2015-06-19 DIAGNOSIS — I499 Cardiac arrhythmia, unspecified: Secondary | ICD-10-CM | POA: Diagnosis not present

## 2015-06-19 DIAGNOSIS — E11621 Type 2 diabetes mellitus with foot ulcer: Secondary | ICD-10-CM | POA: Diagnosis not present

## 2015-06-19 DIAGNOSIS — L97411 Non-pressure chronic ulcer of right heel and midfoot limited to breakdown of skin: Secondary | ICD-10-CM | POA: Diagnosis present

## 2015-06-19 DIAGNOSIS — I509 Heart failure, unspecified: Secondary | ICD-10-CM | POA: Diagnosis not present

## 2015-06-26 DIAGNOSIS — K7030 Alcoholic cirrhosis of liver without ascites: Secondary | ICD-10-CM | POA: Diagnosis not present

## 2015-06-26 DIAGNOSIS — G4730 Sleep apnea, unspecified: Secondary | ICD-10-CM | POA: Diagnosis not present

## 2015-06-26 DIAGNOSIS — E11621 Type 2 diabetes mellitus with foot ulcer: Secondary | ICD-10-CM | POA: Diagnosis not present

## 2015-06-26 DIAGNOSIS — L97411 Non-pressure chronic ulcer of right heel and midfoot limited to breakdown of skin: Secondary | ICD-10-CM | POA: Diagnosis not present

## 2015-07-03 ENCOUNTER — Encounter (HOSPITAL_BASED_OUTPATIENT_CLINIC_OR_DEPARTMENT_OTHER): Payer: Medicaid Other | Attending: General Surgery

## 2015-07-03 DIAGNOSIS — E11621 Type 2 diabetes mellitus with foot ulcer: Secondary | ICD-10-CM | POA: Diagnosis not present

## 2015-07-03 DIAGNOSIS — K7030 Alcoholic cirrhosis of liver without ascites: Secondary | ICD-10-CM | POA: Diagnosis not present

## 2015-07-03 DIAGNOSIS — G4730 Sleep apnea, unspecified: Secondary | ICD-10-CM | POA: Diagnosis not present

## 2015-07-03 DIAGNOSIS — L97411 Non-pressure chronic ulcer of right heel and midfoot limited to breakdown of skin: Secondary | ICD-10-CM | POA: Insufficient documentation

## 2015-07-03 DIAGNOSIS — Z87891 Personal history of nicotine dependence: Secondary | ICD-10-CM | POA: Insufficient documentation

## 2015-07-03 DIAGNOSIS — I509 Heart failure, unspecified: Secondary | ICD-10-CM | POA: Diagnosis not present

## 2015-07-03 DIAGNOSIS — I10 Essential (primary) hypertension: Secondary | ICD-10-CM | POA: Insufficient documentation

## 2015-07-10 DIAGNOSIS — I10 Essential (primary) hypertension: Secondary | ICD-10-CM | POA: Diagnosis not present

## 2015-07-10 DIAGNOSIS — K7030 Alcoholic cirrhosis of liver without ascites: Secondary | ICD-10-CM | POA: Diagnosis not present

## 2015-07-10 DIAGNOSIS — L97411 Non-pressure chronic ulcer of right heel and midfoot limited to breakdown of skin: Secondary | ICD-10-CM | POA: Diagnosis not present

## 2015-07-10 DIAGNOSIS — E11621 Type 2 diabetes mellitus with foot ulcer: Secondary | ICD-10-CM | POA: Diagnosis not present

## 2015-07-17 ENCOUNTER — Telehealth: Payer: Self-pay | Admitting: Family Medicine

## 2015-07-17 ENCOUNTER — Telehealth: Payer: Self-pay | Admitting: *Deleted

## 2015-07-17 NOTE — Telephone Encounter (Signed)
Varney Biles, RN with [facility] called completed a home visit today for the right lateral foot wound.  Patient's right lower leg was warm to touch, swollen and red again.  Patient did not have a fever today and no drainage.  When she first started the home visit; RLL did have some drainage. Patient was diagnosis with cellulitis.  Varney Biles is requesting if possible if the provider would prescribe antibiotic therapy.  Patient was prescribed Penicillin in the past.  Varney Biles stated patient has a very hard time getting transportation.  Please give her a call at [phone] with questions.  Derl Barrow, RN

## 2015-07-17 NOTE — Telephone Encounter (Signed)
Suzzanne Cloud, Therapist, sports. Right later foot wound warm to touch, swollen, and red. History of prior cellulitis in same foot. Transportation difficulties. Stressed importance of being seen by physician to evaluate given prolonged hospitalization for same complaint. Scheduled for appointment with Dr. Andria Frames on 10/20 at 11:30. States she will contact Mr. Reeves and give him the above time and have him call and verify. Discussed if symptoms worsen or he is unable to wait to be seen, to go to Urgent Care or Emergency Department for evaluation. No further concerns.

## 2015-07-17 NOTE — Telephone Encounter (Signed)
Pt called because he said that he needs an antibiotic called in. He said that [facility] nurse should be calling us to tell us which one since this is an ongoing problem. jw

## 2015-07-19 ENCOUNTER — Ambulatory Visit (INDEPENDENT_AMBULATORY_CARE_PROVIDER_SITE_OTHER): Payer: Medicaid Other | Admitting: Family Medicine

## 2015-07-19 ENCOUNTER — Encounter: Payer: Self-pay | Admitting: Family Medicine

## 2015-07-19 VITALS — BP 152/80 | HR 75 | Temp 98.1°F | Ht 70.0 in | Wt 337.1 lb

## 2015-07-19 DIAGNOSIS — Z23 Encounter for immunization: Secondary | ICD-10-CM

## 2015-07-19 DIAGNOSIS — L03115 Cellulitis of right lower limb: Secondary | ICD-10-CM | POA: Diagnosis not present

## 2015-07-19 DIAGNOSIS — F4329 Adjustment disorder with other symptoms: Secondary | ICD-10-CM

## 2015-07-19 DIAGNOSIS — F1010 Alcohol abuse, uncomplicated: Secondary | ICD-10-CM

## 2015-07-19 DIAGNOSIS — F1011 Alcohol abuse, in remission: Secondary | ICD-10-CM

## 2015-07-19 MED ORDER — DOXYCYCLINE HYCLATE 100 MG PO TABS: 100.0000 mg | ORAL_TABLET | Freq: Two times a day (BID) | ORAL | Status: DC

## 2015-07-19 NOTE — Assessment & Plan Note (Signed)
Trigger is chronic venous insufficiency.  I believe we have caught this early.

## 2015-07-19 NOTE — Assessment & Plan Note (Signed)
Nicely stable 

## 2015-07-19 NOTE — Progress Notes (Signed)
   Subjective:    Patient ID: Matthew [name], male    DOB: 1956-02-27, 59 y.o.   MRN: [id number]  HPI Acute visit.  Known chronic venous insufficiency.  Previous cellulitis of Right leg.  Red and woarm again x 48 hours.  No fever or systemic symptoms.   Sober for several months.  Depression is OK.  No SI or HI.  Residing at [employer].    Review of Systems     Objective:   Physical Exam He is at his dry weight. Left leg edema and chronic venous changes. Right leg edema and acute redness.  No weeping.  Affect, good for him       Assessment & Plan:

## 2015-07-19 NOTE — Assessment & Plan Note (Signed)
Stable.  Would like to see wt loss.  Difficult to tell true wt loss with wide fluctuations with fluid weight.

## 2015-07-19 NOTE — Patient Instructions (Signed)
My favorite bible story is the Book of Job. I sent in antibiotics. If you are getting better, see her next Friday if things are going well.  Sooner if not improving.

## 2015-07-19 NOTE — Assessment & Plan Note (Signed)
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  Attending [place] meetings.  Struggling with step 2

## 2015-07-24 DIAGNOSIS — E11621 Type 2 diabetes mellitus with foot ulcer: Secondary | ICD-10-CM | POA: Diagnosis not present

## 2015-07-24 DIAGNOSIS — I10 Essential (primary) hypertension: Secondary | ICD-10-CM | POA: Diagnosis not present

## 2015-07-24 DIAGNOSIS — L97411 Non-pressure chronic ulcer of right heel and midfoot limited to breakdown of skin: Secondary | ICD-10-CM | POA: Diagnosis not present

## 2015-07-24 DIAGNOSIS — K7030 Alcoholic cirrhosis of liver without ascites: Secondary | ICD-10-CM | POA: Diagnosis not present

## 2015-07-27 ENCOUNTER — Ambulatory Visit (INDEPENDENT_AMBULATORY_CARE_PROVIDER_SITE_OTHER): Payer: Medicaid Other | Admitting: Family Medicine

## 2015-07-27 ENCOUNTER — Encounter: Payer: Self-pay | Admitting: Family Medicine

## 2015-07-27 VITALS — BP 135/68 | HR 72 | Temp 98.1°F | Ht 70.0 in | Wt 341.2 lb

## 2015-07-27 DIAGNOSIS — N179 Acute kidney failure, unspecified: Secondary | ICD-10-CM | POA: Diagnosis not present

## 2015-07-27 DIAGNOSIS — L03115 Cellulitis of right lower limb: Secondary | ICD-10-CM | POA: Diagnosis present

## 2015-07-27 DIAGNOSIS — E1169 Type 2 diabetes mellitus with other specified complication: Secondary | ICD-10-CM

## 2015-07-27 LAB — BASIC METABOLIC PANEL WITH GFR
BUN: 24 mg/dL (ref 7–25)
CO2: 27 mmol/L (ref 20–31)
Calcium: 9.2 mg/dL (ref 8.6–10.3)
Chloride: 100 mmol/L (ref 98–110)
Creat: 1.33 mg/dL (ref 0.70–1.33)
GFR, Est African American: 68 mL/min (ref >=60)
GFR, Est Non African American: 59 mL/min — ABNORMAL LOW (ref >=60)
GLUCOSE: 80 mg/dL (ref 65–99)
POTASSIUM: 4.3 mmol/L (ref 3.5–5.3)
SODIUM: 134 mmol/L — AB (ref 135–146)

## 2015-07-27 LAB — POCT GLYCOSYLATED HEMOGLOBIN (HGB A1C): Hemoglobin A1C: 5.2

## 2015-07-27 MED ORDER — DOXYCYCLINE HYCLATE 100 MG PO TABS: 100.0000 mg | ORAL_TABLET | Freq: Two times a day (BID) | ORAL | Status: DC

## 2015-07-27 NOTE — Patient Instructions (Addendum)
Thank you so much for coming to visit today! We will check your kidney function today! Your A1C is 5.2 today, improved from 6.2 when you were in the hospital. Please continue to work on diet and exercise. We will extend you Doxycycline for another 7 days. Please follow up here next week as scheduled. If you develop fevers or the cellulitis worsens, please call the office to come in sooner.  Thanks again and keep up the good work! Dr. Samule Ohm 6:25-34

## 2015-07-31 ENCOUNTER — Encounter (HOSPITAL_BASED_OUTPATIENT_CLINIC_OR_DEPARTMENT_OTHER): Payer: Medicaid Other | Attending: General Surgery

## 2015-07-31 DIAGNOSIS — L0390 Cellulitis, unspecified: Secondary | ICD-10-CM | POA: Insufficient documentation

## 2015-07-31 DIAGNOSIS — Z8631 Personal history of diabetic foot ulcer: Secondary | ICD-10-CM | POA: Insufficient documentation

## 2015-07-31 DIAGNOSIS — L859 Epidermal thickening, unspecified: Secondary | ICD-10-CM | POA: Diagnosis not present

## 2015-07-31 DIAGNOSIS — K7030 Alcoholic cirrhosis of liver without ascites: Secondary | ICD-10-CM | POA: Insufficient documentation

## 2015-07-31 DIAGNOSIS — I110 Hypertensive heart disease with heart failure: Secondary | ICD-10-CM | POA: Diagnosis not present

## 2015-07-31 DIAGNOSIS — L97511 Non-pressure chronic ulcer of other part of right foot limited to breakdown of skin: Secondary | ICD-10-CM | POA: Insufficient documentation

## 2015-07-31 DIAGNOSIS — L02611 Cutaneous abscess of right foot: Secondary | ICD-10-CM | POA: Diagnosis not present

## 2015-07-31 DIAGNOSIS — I509 Heart failure, unspecified: Secondary | ICD-10-CM | POA: Insufficient documentation

## 2015-07-31 DIAGNOSIS — G4730 Sleep apnea, unspecified: Secondary | ICD-10-CM | POA: Diagnosis not present

## 2015-07-31 DIAGNOSIS — Z87891 Personal history of nicotine dependence: Secondary | ICD-10-CM | POA: Diagnosis not present

## 2015-07-31 DIAGNOSIS — E11621 Type 2 diabetes mellitus with foot ulcer: Secondary | ICD-10-CM | POA: Insufficient documentation

## 2015-07-31 NOTE — Assessment & Plan Note (Signed)
-   A1C 5.2 today - Continue management with diet and exercise. No medications indicated - Recheck A1C in one year

## 2015-07-31 NOTE — Assessment & Plan Note (Signed)
-   Dr. Andria Frames, who saw Mr. Leiner at last visit, asked to evaluate leg since he examined it at initial visit. Reports significant improvement in erythema and warmth. - Additional seven days of Doxycycline prescribed. - Follow up in one week - Refuses hospitalization for cellulitis. State he will only consider hospitalization if sepsis develops. - Call if fevers or worsening develops

## 2015-07-31 NOTE — Progress Notes (Signed)
Subjective:     Patient ID: Matthew [name], male   DOB: 08-11-1956, 59 y.o.   MRN: [id number]  HPI Matthew [name] is a 59yo male presenting today for follow up of cellulitis and diabetes management. # Cellulitis: - Located of right lower extremity - Initiated on Doxycycline on 07/19/2015 to complete a ten day course. Course to be completed on 10/30. - Notes some improvement in cellulitis. States it took a few days to work, but cellulitis has been improving over the last several days. - Continues to note some redness and warmth. - Denies fever  # Diabetes: - Not currently on medications - A1C 6.2 in 04/2015 - Does not check blood sugars  - Does not smoke. History of alcohol abuse, but currently doing well with sobriety and active in [place].  Review of Systems Per HPI    Objective:   Physical Exam  Constitutional: He appears well-developed and well-nourished. No distress.  Cardiovascular: Normal rate and regular rhythm.  Exam reveals no gallop and no friction rub.   No murmur heard. Pulmonary/Chest: Effort normal. No respiratory distress. He has no wheezes. He has no rales.  Slightly decreased air movement in right lung base, improved from prior  Musculoskeletal:  Warmth and erythema of right lower extremity noted. Chronic edematous changes noted bilaterally in lower extremities, but much improved from last exam  Psychiatric: He has a normal mood and affect. His behavior is normal.       Assessment and Plan:    DM type 2 (diabetes mellitus, type 2) - A1C 5.2 today - Continue management with diet and exercise. No medications indicated - Recheck A1C in one year  Cellulitis of right lower leg - Dr. Andria Frames, who saw Matthew [name] at last visit, asked to evaluate leg since he examined it at initial visit. Reports significant improvement in erythema and warmth. - Additional seven days of Doxycycline prescribed. - Follow up in one week - Refuses hospitalization for cellulitis. State he  will only consider hospitalization if sepsis develops. - Call if fevers or worsening develops

## 2015-07-31 NOTE — Assessment & Plan Note (Signed)
Recheck BMP.

## 2015-08-03 ENCOUNTER — Encounter: Payer: Self-pay | Admitting: Family Medicine

## 2015-08-03 ENCOUNTER — Ambulatory Visit (INDEPENDENT_AMBULATORY_CARE_PROVIDER_SITE_OTHER): Payer: Medicaid Other | Admitting: Family Medicine

## 2015-08-03 VITALS — BP 127/67 | HR 69 | Temp 98.2°F | Ht 70.0 in | Wt 348.0 lb

## 2015-08-03 DIAGNOSIS — L03115 Cellulitis of right lower limb: Secondary | ICD-10-CM | POA: Diagnosis present

## 2015-08-03 DIAGNOSIS — R600 Localized edema: Secondary | ICD-10-CM | POA: Diagnosis not present

## 2015-08-03 NOTE — Patient Instructions (Addendum)
Thank you so much for coming to visit me today. Please complete your course of antibiotics as prescribed. Please increase your Furosemide to 40mg  twice a day for the next two days; you may then decrease the dose back to 40mg  daily. Please keep your legs elevated when possible. Please contact the office if your swelling, redness, or breathing worsens, however I am hopeful that you will continue to improve.  Thanks again! Dr. Gerlean Ren

## 2015-08-05 DIAGNOSIS — R600 Localized edema: Secondary | ICD-10-CM | POA: Insufficient documentation

## 2015-08-05 NOTE — Progress Notes (Signed)
Subjective:     Patient ID: Matthew [name], male   DOB: May 31, 1956, 59 y.o.   MRN: [id number]  HPI Mr. Anding is a 59yo male presenting today for follow up of his cellulitis of right lower extremity.  # Cellulitis: - Was previously located on right lower extremity - Has been taking Doxycycline as prescribed. Last dose tomorrow. - Notes significant improvement in erythema and warmth since last visit - Denies fevers - Denies rash elsewhere  # Leg Edema: - Notes worsening lower extremity edema. Improved with elevation, but states the facility he is at does not allow him to sit and elevate his legs as needed - Requests letter to facility to allow him to elevate legs - Notes some shortness of breath with exertion, but states he has recently started back to walking and thinks he is out of shape. States he can normally tell when he has fluid on his lungs and does not think that's what is going on.  # Social History Update: - Only has one more month left at facility - Currently looking for an apartment and job - Considering going back to college to obtain Masters in Industrial/product designer. - Does not smoke. Currently sober and attends AA meetings.  Review of Systems Per HPI    Objective:   Physical Exam  Cardiovascular: Normal rate and regular rhythm.  Exam reveals no gallop and no friction rub.   No murmur heard. Pulmonary/Chest: Effort normal. No respiratory distress. He has no wheezes. He has no rales.  No crackles noted. Decreased movement of right lung base at baseline  Musculoskeletal:  2+ pitting edema bilaterally  Skin:  Chronic skin changes noted in legs bilaterally secondary to edema, erythema and warmth noted last week resolved       Assessment and Plan:     Cellulitis of right lower leg - Resolved - Complete course of antibiotics - Follow up if symptoms worsen  Leg edema - No evidence of CHF on recent echocardiogram. Suspect venous insufficiency - Letter given to allow leg  elevation at facility - Increase lasix to 40mg  BID for two days; then decrease back down to 40mg  daily - If shortness of breath worsens, contact office or call 911 depending on severity - Followed by wound clinic. Consider TED hose or Unna boots if fails to resolve. - Monitor weight

## 2015-08-05 NOTE — Assessment & Plan Note (Addendum)
-   No evidence of CHF on recent echocardiogram. Suspect venous insufficiency - Letter given to allow leg elevation at facility - Increase lasix to 40mg  BID for two days; then decrease back down to 40mg  daily - If shortness of breath worsens, contact office or call 911 depending on severity - Followed by wound clinic. Consider TED hose or Unna boots if fails to resolve. - Monitor weight

## 2015-08-05 NOTE — Assessment & Plan Note (Signed)
-   Resolved - Complete course of antibiotics - Follow up if symptoms worsen

## 2015-08-07 DIAGNOSIS — L97511 Non-pressure chronic ulcer of other part of right foot limited to breakdown of skin: Secondary | ICD-10-CM | POA: Diagnosis not present

## 2015-08-07 DIAGNOSIS — E11621 Type 2 diabetes mellitus with foot ulcer: Secondary | ICD-10-CM | POA: Diagnosis not present

## 2015-08-07 DIAGNOSIS — K7030 Alcoholic cirrhosis of liver without ascites: Secondary | ICD-10-CM | POA: Diagnosis not present

## 2015-08-07 DIAGNOSIS — L859 Epidermal thickening, unspecified: Secondary | ICD-10-CM | POA: Diagnosis not present

## 2015-08-14 DIAGNOSIS — E11621 Type 2 diabetes mellitus with foot ulcer: Secondary | ICD-10-CM | POA: Diagnosis not present

## 2015-08-14 DIAGNOSIS — L97511 Non-pressure chronic ulcer of other part of right foot limited to breakdown of skin: Secondary | ICD-10-CM | POA: Diagnosis not present

## 2015-08-14 DIAGNOSIS — K7030 Alcoholic cirrhosis of liver without ascites: Secondary | ICD-10-CM | POA: Diagnosis not present

## 2015-08-14 DIAGNOSIS — L859 Epidermal thickening, unspecified: Secondary | ICD-10-CM | POA: Diagnosis not present

## 2015-08-21 DIAGNOSIS — K7030 Alcoholic cirrhosis of liver without ascites: Secondary | ICD-10-CM | POA: Diagnosis not present

## 2015-08-21 DIAGNOSIS — L97511 Non-pressure chronic ulcer of other part of right foot limited to breakdown of skin: Secondary | ICD-10-CM | POA: Diagnosis not present

## 2015-08-21 DIAGNOSIS — E11621 Type 2 diabetes mellitus with foot ulcer: Secondary | ICD-10-CM | POA: Diagnosis not present

## 2015-08-21 DIAGNOSIS — L859 Epidermal thickening, unspecified: Secondary | ICD-10-CM | POA: Diagnosis not present

## 2015-08-26 ENCOUNTER — Telehealth: Payer: Self-pay | Admitting: Family Medicine

## 2015-08-26 NOTE — Telephone Encounter (Signed)
Paged to [facility] Emergency Line at approx 1450 by patient Matthew [name]. He reports concern for recurrent episode of RLE cellulitis. Reports chronic recurrent episodes with chronic venous stasis as likely trigger. Most recently had RLE cellulitis treated on 07/19/15 with a 10 day course of Doxycycline for concern for MRSA. He then followed up with PCP on 07/27/15 and some improvement, but extended Doxycycline course by 7 days, total 17 days given (note he declined hospitalization for cellulitis). Next follow-up 07/31/15, he had resolved vs significantly improved cellulitis by PCP and no further doxycyline prescribed. Increased Lasix at that time for edema.  Today he reports cellulitis seems to have returned without past 24 hours, now complains of red and warmth of RLE same as before. No significant pain or change of swelling, has chronic swelling. No recent other courses of antibiotics other than the above. He is requesting outpatient rx for antibiotic course. I advised him that we do not typically do this over the phone, and offered him a 24 hour follow-up apt in [facility] tomorrow 11/28 at 9:45am, he agrees to this follow-up plan. He is not interested in having this cellulitis evaluated at other locations today including urgent care or ED. He has declined hospitalization for this in the past. He has important follow-up on Tuesday at [facility] by his report.  Matthew [name], [place], PGY-3

## 2015-08-27 ENCOUNTER — Ambulatory Visit (INDEPENDENT_AMBULATORY_CARE_PROVIDER_SITE_OTHER): Payer: Medicaid Other | Admitting: Family Medicine

## 2015-08-27 ENCOUNTER — Encounter: Payer: Self-pay | Admitting: Family Medicine

## 2015-08-27 VITALS — BP 141/67 | HR 73 | Temp 99.1°F | Ht 70.0 in | Wt 354.0 lb

## 2015-08-27 DIAGNOSIS — L03115 Cellulitis of right lower limb: Secondary | ICD-10-CM | POA: Diagnosis present

## 2015-08-27 MED ORDER — CEFTRIAXONE SODIUM 1 G IJ SOLR
1.0000 g | Freq: Once | INTRAMUSCULAR | Status: AC
  Administered 2015-08-27: 1 g via INTRAMUSCULAR

## 2015-08-27 MED ORDER — DOXYCYCLINE HYCLATE 100 MG PO TABS: 100.0000 mg | ORAL_TABLET | Freq: Two times a day (BID) | ORAL | Status: DC

## 2015-08-27 NOTE — Assessment & Plan Note (Signed)
Worsening cellulitis today. Patient refused admission to the hospital for IV antibiotics. Will give 1g of ceftriaxone in the office and start a course of doxycycline. Also with High Well's Criteria for DVT. Discussed obtaining an [place] to rule this out, however patient declined. Stated "I just don't want to go back to the hospital." Will follow up in 2 days (patient stated that he would not be able to come in tomorrow). May need further discussion for IV antibiotics and [place] if not improving. Return precautions reviewed.

## 2015-08-27 NOTE — Addendum Note (Signed)
Addended by: Charlton Haws on: 08/27/2015 10:56 AM   Modules accepted: Orders

## 2015-08-27 NOTE — Patient Instructions (Signed)
We will give you a shot of antibiotics today. Please start taking the doxycycline today. If you notice the redness worsening, please let us know as soon as possible. Please come back to the clinic in 2 days for a recheck.   Blood clots can also look like the swelling and redness in your leg. We need to get an ultrasound to have this ruled out.   Take care,  Dr Jerline Pain

## 2015-08-27 NOTE — Progress Notes (Signed)
    Subjective:  Matthew [name] is a 59 y.o. male who presents to the [facility] today for same day appointment with a chief complaint of RLE cellulitis.   HPI:  RLE Cellulitis Patient has a history of recurrent RLE cellulitis. He had previously been treated over the past month with a total 17 day course of doxycyline. He finished his course of antibiotics stopped "a couple weeks ago." Yesterday morning he started noticing pain in the back of his right leg and then started noticing redness all over his right lower leg. Redness has continued to worsen into this morning with more swelling. Patient denies any recent trauma or skin lesions. 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 He had slight chills yesterday morning that have now improved. No chest pain or shortness of breath. Mild nausea after eating with no vomiting. No weakness or numbness. No history of blood clots or prolonged immobilizations.   ROS: Per HPI  Objective:  Physical Exam: BP 141/67 mmHg  Pulse 73  Temp(Src) 99.1 F (37.3 C) (Oral)  Ht 5\' 10"  (1.778 m)  Wt 354 lb (160.573 kg)  BMI 50.79 kg/m2  Gen: NAD, resting comfortably CV: RRR with no murmurs appreciated Lungs: NWOB, CTAB with no crackles, wheezes, or rhonchi GI: Normal bowel sounds present. Soft, Nontender, Nondistended. MSK: - RLE: FROM. Normal strength. Tender to palpation along calf.  Skin:  - RLE: confluent area of erythema circumferential around lower leg, approximately 25cm in diameter. Blanchable. No open wounds or lesions noted. No areas of fluctuance noted. Wound on plantar aspect of right foot with dressing in place. Clean, dry, and intact.  Neuro: grossly normal, moves all extremities Psych: Normal affect and thought content  Assessment/Plan:  Cellulitis of right lower leg Worsening cellulitis today. Patient refused admission to the hospital for IV antibiotics. Will give 1g of ceftriaxone in  the office and start a course of doxycycline. Also with High Well's Criteria for DVT. Discussed obtaining an [place] to rule this out, however patient declined. Stated "I just don't want to go back to the hospital." Will follow up in 2 days (patient stated that he would not be able to come in tomorrow). May need further discussion for IV antibiotics and [place] if not improving. Return precautions reviewed.     Algis Greenhouse. Jerline Pain, [place] Resident PGY-2 08/27/2015 10:44 AM

## 2015-08-28 DIAGNOSIS — E11621 Type 2 diabetes mellitus with foot ulcer: Secondary | ICD-10-CM | POA: Diagnosis not present

## 2015-08-28 DIAGNOSIS — L97511 Non-pressure chronic ulcer of other part of right foot limited to breakdown of skin: Secondary | ICD-10-CM | POA: Diagnosis not present

## 2015-08-28 DIAGNOSIS — K7030 Alcoholic cirrhosis of liver without ascites: Secondary | ICD-10-CM | POA: Diagnosis not present

## 2015-08-28 DIAGNOSIS — L859 Epidermal thickening, unspecified: Secondary | ICD-10-CM | POA: Diagnosis not present

## 2015-08-29 ENCOUNTER — Ambulatory Visit (INDEPENDENT_AMBULATORY_CARE_PROVIDER_SITE_OTHER): Payer: Medicaid Other | Admitting: Family Medicine

## 2015-08-29 VITALS — BP 131/58 | HR 72 | Temp 98.5°F | Wt 356.0 lb

## 2015-08-29 DIAGNOSIS — R600 Localized edema: Secondary | ICD-10-CM

## 2015-08-29 DIAGNOSIS — L03115 Cellulitis of right lower limb: Secondary | ICD-10-CM | POA: Diagnosis not present

## 2015-08-29 MED ORDER — CEFTRIAXONE SODIUM 1 G IJ SOLR
1.0000 g | Freq: Once | INTRAMUSCULAR | Status: AC
  Administered 2015-08-29: 1 g via INTRAMUSCULAR

## 2015-08-29 NOTE — Patient Instructions (Signed)
Thank you so much for coming to visit today! We will give you another dose of Ceftriaxone today. Please continue your antibiotics as prescribed. I have ordered an ultrasound of your right leg to make sure there is not a clot. Please follow up on Friday as scheduled. If you develop any worsening of the redness, pain, or swelling, or if you develop fevers, please go to the Emergency Department.  Thanks again! Dr. Gerlean Ren

## 2015-08-29 NOTE — Progress Notes (Signed)
Subjective:     Patient ID: Matthew [name], male   DOB: May 23, 1956, 59 y.o.   MRN: [id number]  HPI Matthew [name] is a 59yo male presenting today for follow up of his cellulitis. - First noted redness and swelling of right lower extremity on 11/27. Presented to office on 11/28 for same day visit and prescribed Doxycycline x14day course. Also given 1g Ceftriaxone. - Unsure if redness has improved. Swelling improved. 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 States he has too many appointments and plans until 11/10. If needs to be hospitalized after 11/10 he will consider it. - Followed by wound care. States right foot ulcer almost healed.  Review of Systems Per HPI    Objective:   Physical Exam  Constitutional: He appears well-developed and well-nourished.  Musculoskeletal:  2+ pitting edema of right lower extremity noted. Mild edema of left lower extremity. Negative Homan's. Posterior calf very tight and edematous.  Skin:  - Erythema improved from demarcations. Blisters healing on posterior portion of calf. Please refer to pictures.  Psychiatric: He has a normal mood and affect. His behavior is normal.           Assessment and Plan:     Cellulitis of right lower leg - Refuses hospitalization - Ultrasound of right lower extremity for possible DVT. - Continue Doxycycline as prescribed. Complete 14day course - Follow up in two days for continued monitoring.  - Present to ED if worsening redness, pain, edema, shortness of breath, or fevers occur

## 2015-08-29 NOTE — Assessment & Plan Note (Signed)
-   Refuses hospitalization - Ultrasound of right lower extremity for possible DVT. - Continue Doxycycline as prescribed. Complete 14day course - Follow up in two days for continued monitoring.  - Present to ED if worsening redness, pain, edema, shortness of breath, or fevers occur

## 2015-08-30 ENCOUNTER — Inpatient Hospital Stay (HOSPITAL_COMMUNITY): Admission: RE | Admit: 2015-08-30 | Payer: Self-pay | Source: Ambulatory Visit

## 2015-08-31 ENCOUNTER — Ambulatory Visit (HOSPITAL_COMMUNITY)
Admission: RE | Admit: 2015-08-31 | Discharge: 2015-08-31 | Disposition: A | Discharge status: Home / Self Care | Payer: Medicaid Other | Source: Ambulatory Visit | Attending: Cardiology | Admitting: Cardiology

## 2015-08-31 ENCOUNTER — Ambulatory Visit (INDEPENDENT_AMBULATORY_CARE_PROVIDER_SITE_OTHER): Payer: Medicaid Other | Admitting: Family Medicine

## 2015-08-31 VITALS — BP 130/72 | HR 91 | Temp 98.1°F | Wt 357.0 lb

## 2015-08-31 DIAGNOSIS — I10 Essential (primary) hypertension: Secondary | ICD-10-CM | POA: Diagnosis not present

## 2015-08-31 DIAGNOSIS — L03115 Cellulitis of right lower limb: Secondary | ICD-10-CM

## 2015-08-31 DIAGNOSIS — E119 Type 2 diabetes mellitus without complications: Secondary | ICD-10-CM | POA: Diagnosis not present

## 2015-08-31 DIAGNOSIS — R600 Localized edema: Secondary | ICD-10-CM | POA: Diagnosis not present

## 2015-08-31 NOTE — Progress Notes (Signed)
Subjective:     Patient ID: Matthew [name], male   DOB: 1956/08/07, 59 y.o.   MRN: [id number]  HPI Matthew [name] is a 59yo male presenting today for follow up of cellulitis of right lower extremity.  - Notes significant improvement of erythema, redness, and tenderness - Denies fever - Has continued course of Doxycycline as prescribed - Ultrasound of right lower extremity scheduled for today at 3:30pm - Denies any further complaints.  Review of Systems Per HPI    Objective:   Physical Exam  Constitutional: He appears well-developed and well-nourished. No distress.  Skin:  Significant improvement in edema, erythema, and tenderness of right lower extremity, however 1+ pitting edema still noted. Negative Homans. Normal gait.  Psychiatric: He has a normal mood and affect. His behavior is normal.      Assessment and Plan:     Cellulitis of right lower leg - Improving - Continue current course of antibiotics - Ultrasound of right lower extremity to rule out clot - Follow up in one week

## 2015-08-31 NOTE — Patient Instructions (Signed)
Thank you so much for coming to visit me! I'm so glad you are doing better. Please go for utlrasound today and continue to take your antibiotics as prescribed. Follow up in one week as scheduled or sooner if needed.  Thanks again! Dr. Gerlean Ren

## 2015-09-01 NOTE — Assessment & Plan Note (Signed)
-   Improving - Continue current course of antibiotics - Ultrasound of right lower extremity to rule out clot - Follow up in one week

## 2015-09-03 ENCOUNTER — Other Ambulatory Visit: Payer: Self-pay | Admitting: Family Medicine

## 2015-09-04 ENCOUNTER — Other Ambulatory Visit: Payer: Self-pay | Admitting: Family Medicine

## 2015-09-04 ENCOUNTER — Encounter (HOSPITAL_BASED_OUTPATIENT_CLINIC_OR_DEPARTMENT_OTHER): Payer: Medicaid Other | Attending: General Surgery

## 2015-09-04 DIAGNOSIS — I509 Heart failure, unspecified: Secondary | ICD-10-CM | POA: Diagnosis not present

## 2015-09-04 DIAGNOSIS — E11621 Type 2 diabetes mellitus with foot ulcer: Secondary | ICD-10-CM | POA: Diagnosis not present

## 2015-09-04 DIAGNOSIS — G4730 Sleep apnea, unspecified: Secondary | ICD-10-CM | POA: Insufficient documentation

## 2015-09-04 DIAGNOSIS — I110 Hypertensive heart disease with heart failure: Secondary | ICD-10-CM | POA: Diagnosis not present

## 2015-09-04 DIAGNOSIS — K7030 Alcoholic cirrhosis of liver without ascites: Secondary | ICD-10-CM | POA: Diagnosis not present

## 2015-09-04 DIAGNOSIS — L97511 Non-pressure chronic ulcer of other part of right foot limited to breakdown of skin: Secondary | ICD-10-CM | POA: Insufficient documentation

## 2015-09-04 DIAGNOSIS — Z8631 Personal history of diabetic foot ulcer: Secondary | ICD-10-CM | POA: Insufficient documentation

## 2015-09-07 ENCOUNTER — Ambulatory Visit (INDEPENDENT_AMBULATORY_CARE_PROVIDER_SITE_OTHER): Payer: Medicaid Other | Admitting: Family Medicine

## 2015-09-07 ENCOUNTER — Encounter: Payer: Self-pay | Admitting: Family Medicine

## 2015-09-07 VITALS — BP 128/80 | HR 77 | Temp 98.4°F | Ht 70.0 in | Wt 362.0 lb

## 2015-09-07 DIAGNOSIS — L03115 Cellulitis of right lower limb: Secondary | ICD-10-CM

## 2015-09-07 MED ORDER — DOXYCYCLINE HYCLATE 100 MG PO TABS: 100.0000 mg | ORAL_TABLET | Freq: Two times a day (BID) | ORAL | Status: DC

## 2015-09-07 MED ORDER — CEFTRIAXONE SODIUM 1 G IJ SOLR
250.0000 mg | Freq: Once | INTRAMUSCULAR | Status: AC
  Administered 2015-09-07: 250 mg via INTRAMUSCULAR

## 2015-09-07 NOTE — Patient Instructions (Signed)
Thank you so much for coming to visit me today! Please follow up in one week for follow up of cellulitis so we can monitor your progress. I will give you an additional injection of antibiotics today and extend your antibiotic course by seven more days.   Thanks again! Dr. Gerlean Ren

## 2015-09-10 NOTE — Progress Notes (Signed)
Subjective:     Patient ID: Matthew [name], male   DOB: 08-May-1956, 59 y.o.   MRN: [id number]  HPI Matthew [name] is a 58yo male presenting today for follow up of right lower extremity cellulitis. - Note has been refusing hospitalization throughout course of cellulitis - Notes significant improvement in redness, pain, and swelling, however notes slight extension of erythema over anterior ankle - Continues to deny fever - Doppler without signs of clot - Following with wound care for right foot ulcer  Review of Systems Per HPI    Objective:   Physical Exam  Constitutional: He appears well-developed and well-nourished. No distress.  Musculoskeletal: He exhibits edema.  Skin:  Significant improvement in erythema overall, however slight extension of erythema over anterior ankle  Psychiatric: He has a normal mood and affect. His behavior is normal.       Assessment:     Cellulitis of right lower leg - Overall improvement, however concern of possible extension of erythema over anterior ankle - Ceftriaxone IM today - Extend course of doxycycline by seven days - Follow up in one week - Continues to refuse hospitalization

## 2015-09-10 NOTE — Assessment & Plan Note (Signed)
-   Overall improvement, however concern of possible extension of erythema over anterior ankle - Ceftriaxone IM today - Extend course of doxycycline by seven days - Follow up in one week - Continues to refuse hospitalization

## 2015-09-11 DIAGNOSIS — Z8631 Personal history of diabetic foot ulcer: Secondary | ICD-10-CM | POA: Diagnosis not present

## 2015-09-11 DIAGNOSIS — L97511 Non-pressure chronic ulcer of other part of right foot limited to breakdown of skin: Secondary | ICD-10-CM | POA: Diagnosis not present

## 2015-09-11 DIAGNOSIS — K7030 Alcoholic cirrhosis of liver without ascites: Secondary | ICD-10-CM | POA: Diagnosis not present

## 2015-09-11 DIAGNOSIS — E11621 Type 2 diabetes mellitus with foot ulcer: Secondary | ICD-10-CM | POA: Diagnosis not present

## 2015-09-14 ENCOUNTER — Ambulatory Visit (INDEPENDENT_AMBULATORY_CARE_PROVIDER_SITE_OTHER): Payer: Medicaid Other | Admitting: Family Medicine

## 2015-09-14 ENCOUNTER — Encounter: Payer: Self-pay | Admitting: Family Medicine

## 2015-09-14 VITALS — BP 138/68 | HR 73 | Temp 98.1°F | Ht 70.0 in | Wt 366.0 lb

## 2015-09-14 DIAGNOSIS — R079 Chest pain, unspecified: Secondary | ICD-10-CM | POA: Diagnosis not present

## 2015-09-14 DIAGNOSIS — L03115 Cellulitis of right lower limb: Secondary | ICD-10-CM

## 2015-09-14 DIAGNOSIS — S43422A Sprain of left rotator cuff capsule, initial encounter: Secondary | ICD-10-CM

## 2015-09-14 DIAGNOSIS — Z8631 Personal history of diabetic foot ulcer: Secondary | ICD-10-CM | POA: Diagnosis not present

## 2015-09-14 DIAGNOSIS — E11621 Type 2 diabetes mellitus with foot ulcer: Secondary | ICD-10-CM | POA: Diagnosis not present

## 2015-09-14 DIAGNOSIS — L97511 Non-pressure chronic ulcer of other part of right foot limited to breakdown of skin: Secondary | ICD-10-CM | POA: Diagnosis not present

## 2015-09-14 DIAGNOSIS — K7030 Alcoholic cirrhosis of liver without ascites: Secondary | ICD-10-CM | POA: Diagnosis not present

## 2015-09-14 NOTE — Patient Instructions (Signed)
Thank you so much for coming to visit me today! You may stop taking your antibiotics after you complete this course. Please let wound care know they may start doing compression. I have written a prescription for compression stockings for you to take to the medical supply store. If you develop chest pain that does not improve, please call 911.  Thanks again! Merry Christmas!!!! Dr. Gerlean Ren

## 2015-09-15 DIAGNOSIS — S43429A Sprain of unspecified rotator cuff capsule, initial encounter: Secondary | ICD-10-CM | POA: Insufficient documentation

## 2015-09-15 NOTE — Assessment & Plan Note (Signed)
-   Appears resolved, but exam somewhat difficult given chronic venous stasis dermatitis changes. - Recommend compression. Prescription for compression stockings given. Wound care to consider other compression methods. - Stressed importance of elevation - Complete current course of antibiotics. - Follow up in two weeks. To contact office if redness, swelling, pain appears to be worsening

## 2015-09-15 NOTE — Assessment & Plan Note (Addendum)
-   EKG obtained due to occasional radiation to left chest. Unchanged. - Suspected to be secondary to moving over the last several weeks. Today is last day moving, so should be able to rest left shoulder. - May use over the counter pain medications as needed - Variant angina in differential given occasional radiation to left chest occuring at night and some anxiety over recent move. Consider nitroglycerin if no improvement. Note refusal for trip to ED for evaluation of occasional chest pain. - Discussed if chest pain occurs that does not resolve to call 911

## 2015-09-15 NOTE — Progress Notes (Signed)
Subjective:     Patient ID: Matthew [name], male   DOB: [birth year], 59 y.o.   MRN: [id number]  HPI Mr. Model is a 59yo male presenting today for follow up of cellulitis. - Reports continued improvement since last week - Continues to follow with wound care - Has been having trouble elevating legs because he is in process of moving. Today should be his last day moving and he agrees to improve elevation starting tomorrow. - Denies fever - Reports two and a half days of antibiotics left  # Left Shoulder Pain: - Reports occasional shoulder pain from left upper arm, shoulder, and occasionally over into left chest - Present for approximately three weeks - Mostly occurs at night. Occasionally occurs with movement of left arm. - Never notes chest pain with exertion or activity - Denies diaphoresis, nausea, shortness of breath - No trauma noted, however states pain started shortly after he started moving and was carrying large boxes. - History of atrial fibrillation noted  Review of Systems Per HPI    Objective:   Physical Exam  Constitutional: He appears well-developed and well-nourished. No distress.  HENT:  Head: Normocephalic and atraumatic.  Cardiovascular: Normal rate and regular rhythm.  Exam reveals no gallop and no friction rub.   No murmur heard. Pulmonary/Chest: Effort normal. No respiratory distress. He has no wheezes. He has no rales.  Musculoskeletal: He exhibits edema.  Shoulders symmetric bilaterally. Passive and active ROM intact in shoulders bilaterally. Muscle strength 5/5 in upper extremities bilaterally. Rotator cuff appears intact, however slight ain noted with supraspinatus resistance.  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 Warmth symmetric bilaterally, suspected to be secondary to venous stasis. No well demarcated areas of erythema, resolved from last office visit. Pedal pulses noted.  Psychiatric: He has a normal mood and  affect. His behavior is normal.   EKG unchanged.     Assessment and Plan:     Rotator cuff (capsule) sprain - EKG obtained due to occasional radiation to left chest. Unchanged. - Suspected to be secondary to moving over the last several weeks. Today is last day moving, so should be able to rest left shoulder. - May use over the counter pain medications as needed - Variant angina in differential given occasional radiation to left chest occuring at night and some anxiety over recent move. Consider nitroglycerin if no improvement. Note refusal for trip to ED for evaluation of occasional chest pain. - Discussed if chest pain occurs that does not resolve to call 911  Cellulitis of right lower leg - Appears resolved, but exam somewhat difficult given chronic venous stasis dermatitis changes. - Recommend compression. Prescription for compression stockings given. Wound care to consider other compression methods. - Stressed importance of elevation - Complete current course of antibiotics. - Follow up in two weeks. To contact office if redness, swelling, pain appears to be worsening

## 2015-09-25 ENCOUNTER — Encounter: Payer: Self-pay | Admitting: Family Medicine

## 2015-09-25 ENCOUNTER — Ambulatory Visit (INDEPENDENT_AMBULATORY_CARE_PROVIDER_SITE_OTHER): Payer: Medicaid Other | Admitting: Family Medicine

## 2015-09-25 VITALS — BP 117/64 | HR 76 | Temp 97.9°F | Wt 365.8 lb

## 2015-09-25 DIAGNOSIS — E11621 Type 2 diabetes mellitus with foot ulcer: Secondary | ICD-10-CM | POA: Diagnosis not present

## 2015-09-25 DIAGNOSIS — S43422A Sprain of left rotator cuff capsule, initial encounter: Secondary | ICD-10-CM | POA: Diagnosis present

## 2015-09-25 DIAGNOSIS — L03115 Cellulitis of right lower limb: Secondary | ICD-10-CM

## 2015-09-25 DIAGNOSIS — K219 Gastro-esophageal reflux disease without esophagitis: Secondary | ICD-10-CM | POA: Diagnosis not present

## 2015-09-25 DIAGNOSIS — K7030 Alcoholic cirrhosis of liver without ascites: Secondary | ICD-10-CM | POA: Diagnosis not present

## 2015-09-25 DIAGNOSIS — R0602 Shortness of breath: Secondary | ICD-10-CM

## 2015-09-25 DIAGNOSIS — L97511 Non-pressure chronic ulcer of other part of right foot limited to breakdown of skin: Secondary | ICD-10-CM | POA: Diagnosis not present

## 2015-09-25 DIAGNOSIS — Z8631 Personal history of diabetic foot ulcer: Secondary | ICD-10-CM | POA: Diagnosis not present

## 2015-09-25 NOTE — Patient Instructions (Signed)
Thanks for coming to see me today! Please double your lasix for the next two days. I recommend an over the counter medication, such as Protonix, for your abdominal pain. Please try to shoulder exercises for your shoulder pain. We can also try an injection or physical therapy if you continue to have pain. Please let me know if your shortness of breath or abdominal pain fail to improve over the next 2 weeks.  Thanks again! Dr. Gerlean Ren

## 2015-09-27 DIAGNOSIS — K219 Gastro-esophageal reflux disease without esophagitis: Secondary | ICD-10-CM | POA: Insufficient documentation

## 2015-09-27 DIAGNOSIS — R0602 Shortness of breath: Secondary | ICD-10-CM | POA: Insufficient documentation

## 2015-09-27 NOTE — Assessment & Plan Note (Addendum)
-   Last Echo 01/2015 with EF 55-60%. Consider repeat if no improvement. - Improvement noted with doubling of lasix on 12/24-25. Double dose for another two days before returning to prescribed dose.  - Encouraged to pick up compression stockings to help with edema. Elevate legs as much as possible at home - Red flags discussed - Follow up if no improvement

## 2015-09-27 NOTE — Assessment & Plan Note (Signed)
-   Prior history noted.  - Encouraged restarting PPI. Wishes to obtain OTC instead of via prescription.

## 2015-09-27 NOTE — Assessment & Plan Note (Signed)
-   Completed course of antibiotics - Resolved

## 2015-09-27 NOTE — Assessment & Plan Note (Signed)
-   Refuses injection or physical therapy - Exercises given - OTC pain management. Counseled on effects of pain medications on liver and kidneys

## 2015-09-27 NOTE — Progress Notes (Signed)
Subjective:     Patient ID: Matthew [name], male   DOB: 1956-06-03, 59 y.o.   MRN: [id number]  HPI Matthew [name] is a 59yo male presenting today for follow up of cellulitis. # Cellulitis: - Completed course of Doxycycline - Denies redness, warmth, tenderness - Symptoms resolved - Denies fever  # Shortness of Breath: - Notes increased shortness of breath with ambulation over the last week - Denies chest pain, cough, wheezing, diaphoresis - Chronic bilateral lower extremity edema, improving. Compression stockings ordered at last visit, but has not yet picked these up. - EKG without change at last office visit - Echo from 01/2015 with EF 55-60% - Doubled dose of lasix on 12/24 and 12/25 with significant improvement  # Left Shoulder Pain: - Continues to note left shoulder pain with movement and at night - Does not use medication for this - Started shortly after beginning move and carrying heavy boxes - Denies weakness, numbness, neck pain  # Epigastric Abdominal Pain: - History of GERD noted. Has not been taking PPI which has helped in past - Denies nausea, vomiting - Similar to prior episodes, which comes and go   - Nonsmoker. Continues sobriety with previous history of alcohol abuse. Past medical history discussed.  Review of Systems Per HPI    Objective:   Physical Exam  Constitutional: He appears well-developed and well-nourished. No distress.  Cardiovascular: Normal rate.  Exam reveals no gallop and no friction rub.   No murmur heard. Pulmonary/Chest: Effort normal. No respiratory distress. He has no wheezes. He has no rales.  Decreased air movement in right lower lobe consistent with prior exams  Abdominal: Soft. He exhibits no distension.  Epigastric tenderness noted  Musculoskeletal:  2+ pitting edema bilaterally  Skin:  Stasis dermatitis of bilateral lower extremities, no increased warmth or erythema  Psychiatric: He has a normal mood and affect. His behavior is  normal.      Assessment and Plan:     Rotator cuff (capsule) sprain - Refuses injection or physical therapy - Exercises given - OTC pain management. Counseled on effects of pain medications on liver and kidneys  Cellulitis of right lower leg - Completed course of antibiotics - Resolved  Shortness of breath - Last Echo 01/2015 with EF 55-60%. Consider repeat if no improvement. - Improvement noted with doubling of lasix on 12/24-25. Double dose for another two days before returning to prescribed dose.  - Encouraged to pick up compression stockings to help with edema. Elevate legs as much as possible at home - Red flags discussed - Follow up if no improvement  GERD (gastroesophageal reflux disease) - Prior history noted.  - Encouraged restarting PPI. Wishes to obtain OTC instead of via prescription.

## 2015-10-02 ENCOUNTER — Encounter (HOSPITAL_BASED_OUTPATIENT_CLINIC_OR_DEPARTMENT_OTHER): Payer: Medicaid Other | Attending: Internal Medicine

## 2015-10-02 DIAGNOSIS — G4730 Sleep apnea, unspecified: Secondary | ICD-10-CM | POA: Diagnosis not present

## 2015-10-02 DIAGNOSIS — I509 Heart failure, unspecified: Secondary | ICD-10-CM | POA: Diagnosis not present

## 2015-10-02 DIAGNOSIS — E11621 Type 2 diabetes mellitus with foot ulcer: Secondary | ICD-10-CM | POA: Insufficient documentation

## 2015-10-02 DIAGNOSIS — Z8631 Personal history of diabetic foot ulcer: Secondary | ICD-10-CM | POA: Diagnosis not present

## 2015-10-02 DIAGNOSIS — I110 Hypertensive heart disease with heart failure: Secondary | ICD-10-CM | POA: Insufficient documentation

## 2015-10-02 DIAGNOSIS — L97511 Non-pressure chronic ulcer of other part of right foot limited to breakdown of skin: Secondary | ICD-10-CM | POA: Insufficient documentation

## 2015-10-02 DIAGNOSIS — K7030 Alcoholic cirrhosis of liver without ascites: Secondary | ICD-10-CM | POA: Insufficient documentation

## 2015-10-05 ENCOUNTER — Other Ambulatory Visit: Payer: Self-pay | Admitting: Family Medicine

## 2015-10-05 ENCOUNTER — Ambulatory Visit: Payer: Self-pay | Admitting: Family Medicine

## 2015-10-05 MED ORDER — ALBUTEROL SULFATE HFA 108 (90 BASE) MCG/ACT IN AERS: 2.0000 | INHALATION_SPRAY | RESPIRATORY_TRACT | Status: DC | PRN

## 2015-10-05 NOTE — Telephone Encounter (Signed)
Contacted concerning refill. States it is bronchitis, which he normally gets 1-2 times a year. Knows to call 911 or go to ED if his breathing gets too labored. Refuses ED visit at this time.

## 2015-10-05 NOTE — Telephone Encounter (Signed)
Patient unable to make appt this morning, he states that he just doesn't feel well enough to come out. Pt is asking for an Rx for an inhaler. He states that he cannot breathe. Pt informed that he would not be able to be diagnosed and treated over the phone. Please advise. Sadie Reynolds, ASA

## 2015-10-05 NOTE — Telephone Encounter (Signed)
If he is having a hard time breathing, I recommend going to the Emergency Department for evaluation. He also had increased shortness of breath at the last office visit as well. Was on albuterol in the past so I will refill this medication, but I highly recommend being evaluated.

## 2015-10-15 MED FILL — SPIRONOLACTONE 50 MG TABLET: 50 | 30 days supply | Qty: 60 | Fill #1

## 2015-10-15 MED FILL — PROAIR HFA 90 MCG INHALER: 108 (90 BAS | 17 days supply | Qty: 9 | Fill #0

## 2015-10-15 MED FILL — FUROSEMIDE 40 MG TABLET: 40 | 90 days supply | Qty: 90 | Fill #1

## 2015-10-16 DIAGNOSIS — E11621 Type 2 diabetes mellitus with foot ulcer: Secondary | ICD-10-CM | POA: Diagnosis not present

## 2015-10-19 ENCOUNTER — Ambulatory Visit: Payer: Self-pay | Admitting: Family Medicine

## 2015-10-22 ENCOUNTER — Encounter: Payer: Self-pay | Admitting: Family Medicine

## 2015-10-22 ENCOUNTER — Ambulatory Visit: Payer: Self-pay | Admitting: Family Medicine

## 2015-10-22 ENCOUNTER — Ambulatory Visit (INDEPENDENT_AMBULATORY_CARE_PROVIDER_SITE_OTHER): Payer: Medicaid Other | Admitting: Family Medicine

## 2015-10-22 VITALS — BP 136/56 | HR 75 | Temp 98.3°F | Ht 70.0 in | Wt 368.1 lb

## 2015-10-22 DIAGNOSIS — J209 Acute bronchitis, unspecified: Secondary | ICD-10-CM | POA: Diagnosis not present

## 2015-10-22 DIAGNOSIS — R509 Fever, unspecified: Secondary | ICD-10-CM | POA: Diagnosis not present

## 2015-10-22 DIAGNOSIS — E1169 Type 2 diabetes mellitus with other specified complication: Secondary | ICD-10-CM | POA: Diagnosis present

## 2015-10-22 DIAGNOSIS — R319 Hematuria, unspecified: Secondary | ICD-10-CM | POA: Diagnosis not present

## 2015-10-22 LAB — POCT URINALYSIS DIPSTICK
Bilirubin, UA: NEGATIVE
GLUCOSE UA: NEGATIVE
KETONES UA: NEGATIVE
Leukocytes, UA: NEGATIVE
Nitrite, UA: NEGATIVE
Protein, UA: NEGATIVE
SPEC GRAV UA: 1.015
Urobilinogen, UA: 1
pH, UA: 7

## 2015-10-22 LAB — POCT UA - MICROSCOPIC ONLY

## 2015-10-22 LAB — POCT GLYCOSYLATED HEMOGLOBIN (HGB A1C): HEMOGLOBIN A1C: 5.6

## 2015-10-22 MED ORDER — LEVOFLOXACIN 500 MG PO TABS: 500.0000 mg | ORAL_TABLET | Freq: Every day | ORAL | Status: DC

## 2015-10-22 MED FILL — levoFLOXacin 500 MG TABS: 500 | 7 days supply | Qty: 7 | Fill #0

## 2015-10-22 NOTE — Patient Instructions (Signed)
Thank you so much for coming to visit today! I have sent an antibiotic to your pharmacy for you to take daily for 7 days. Please let me know if you continue to have shortness of breath or cough and we will re-evaluate. Your urine showed no signs of infection, but did show large blood, so I would consider workup by a urologist. As always, if your shortness of breath significantly worsens, please call 911 or go to the ED.  Thanks again! Dr. Gerlean Ren

## 2015-10-23 NOTE — Progress Notes (Signed)
Subjective:     Patient ID: ORVALL SHINABARGER, male   DOB: 1956/06/15, 60 y.o.   MRN: [id number]  HPI Mr. Zamor is a 60yo male presenting today for cough. - States a [place] noted cough and change in lung exam early last week. Reported temperature of 100F. Recommended he be seen for suspected bronchitis. - States he normally gets bronchitis at least once a year. Feels like prior exacerbations. - Would also like to be checked for UTI. States he normally only has increased temperature without other symptoms. Denies dysuria and frequency. Reports urgency, but suspects this is due to lasix. - Has noted symptoms of cough for approximately 2-3 weeks. Occasionally productive. - Notes occasional shortness of breath, but not consistently with ambulation like he does with increased fluid on his lungs - Last echocardiogram 01/2015 with EF 55-60%  - Nonsmoker  Review of Systems Per HPI. Other systems negative.    Objective:   Physical Exam  Constitutional: He appears well-developed and well-nourished. No distress.  HENT:  Head: Normocephalic and atraumatic.  Cardiovascular: Normal rate and regular rhythm.  Exam reveals no gallop and no friction rub.   No murmur heard. Pulmonary/Chest: Effort normal. No respiratory distress. He has no wheezes.  Abdominal: Soft. He exhibits no distension. There is no tenderness.  Musculoskeletal:  Edema noted in left lower extremity. Edema improved in right lower extremity.  Skin:  Venous stasis dermatitis in lower extremities bilaterally  Psychiatric: He has a normal mood and affect. His behavior is normal.      Assessment and Plan:     1. Type 2 diabetes mellitus with other specified complication ([place]) - POCT A1C 5.6, slightly worsened from 5.2 in 06/2015. - Continue to work on diet and exercise. States he has been eating poorly over the holidays and will continue to work on it.  2. Acute bronchitis, unspecified organism - Will give course of Levaquin  given respiratory and urinary complaints, however urinary infection unlikely.  - Risk of QT prolongation discussed with Pharmacist due to use of Celexa with Levaquin. Risk low and last QT normal. Will monitor. - To follow up if no improvement or if shortness of breath worsens.  3. Hematuria - Noted on Urinalysis, with chart review shows consistently present since 2015 - Referral to Urology placed.

## 2015-10-29 ENCOUNTER — Telehealth: Payer: Self-pay | Admitting: Family Medicine

## 2015-10-29 DIAGNOSIS — R0602 Shortness of breath: Secondary | ICD-10-CM

## 2015-10-29 NOTE — Telephone Encounter (Signed)
Will forward to Dr. Gerlean Ren to advise. Jazmin Hartsell,CMA

## 2015-10-29 NOTE — Telephone Encounter (Signed)
Pt called and would like to know if he needs a chest X-ray. He is also not feeling to much better and wanted to know if the doctor can call in something else in for him. The other issue is he would like a antiacid that is prescribed because OTC cost about 20.00.

## 2015-10-30 MED ORDER — PANTOPRAZOLE SODIUM 40 MG PO TBEC: 40.0000 mg | DELAYED_RELEASE_TABLET | Freq: Every day | ORAL | Status: DC

## 2015-10-30 MED FILL — PANTOPRAZOLE SOD DR 40 MG T: 40 | 30 days supply | Qty: 30 | Fill #0

## 2015-10-30 NOTE — Telephone Encounter (Signed)
I will order a chest xray given his history and last exam without improvement with treatment. He may go to [facility] to have this done. I have also sent a prescription to his pharmacy for Protonix. Please have him come to the clinic for an office visit since he has not improved with treatment.

## 2015-10-31 NOTE — Telephone Encounter (Signed)
Spoke to Matthew [name]. I gave him the info below, he was upset that Dr. Gerlean Ren could not see him before Dec 10, 2014. He said he would be dead by then. I explained that he can call the front desk and make an appointment with another Dr. He said he will get the X-ray and call us back. Ottis Stain, CMA

## 2015-11-26 ENCOUNTER — Encounter (HOSPITAL_BASED_OUTPATIENT_CLINIC_OR_DEPARTMENT_OTHER): Payer: Medicaid Other | Attending: Internal Medicine

## 2015-11-26 DIAGNOSIS — I509 Heart failure, unspecified: Secondary | ICD-10-CM | POA: Insufficient documentation

## 2015-11-26 DIAGNOSIS — I110 Hypertensive heart disease with heart failure: Secondary | ICD-10-CM | POA: Insufficient documentation

## 2015-11-26 DIAGNOSIS — K7030 Alcoholic cirrhosis of liver without ascites: Secondary | ICD-10-CM | POA: Insufficient documentation

## 2015-11-26 DIAGNOSIS — G4730 Sleep apnea, unspecified: Secondary | ICD-10-CM | POA: Insufficient documentation

## 2015-11-26 DIAGNOSIS — L97511 Non-pressure chronic ulcer of other part of right foot limited to breakdown of skin: Secondary | ICD-10-CM | POA: Insufficient documentation

## 2015-11-26 DIAGNOSIS — E11621 Type 2 diabetes mellitus with foot ulcer: Secondary | ICD-10-CM | POA: Diagnosis not present

## 2015-11-26 DIAGNOSIS — Z8631 Personal history of diabetic foot ulcer: Secondary | ICD-10-CM | POA: Insufficient documentation

## 2015-11-28 MED FILL — SPIRONOLACTONE 50 MG TABLET: 50 | 30 days supply | Qty: 60 | Fill #2

## 2015-11-28 MED FILL — CITALOPRAM HBR 20 MG TABLET: 20 | 90 days supply | Qty: 90 | Fill #1

## 2015-11-28 MED FILL — DILTIAZEM 24HR ER 180 MG CA: 180 | 90 days supply | Qty: 90 | Fill #2

## 2015-11-29 MED FILL — PANTOPRAZOLE SOD DR 40 MG T: 40 | 30 days supply | Qty: 30 | Fill #1

## 2015-11-30 MED FILL — SULFAMETHOXAZOLE/TMP DS TAB: 800-160 | 10 days supply | Qty: 20 | Fill #0

## 2015-12-03 ENCOUNTER — Encounter (HOSPITAL_BASED_OUTPATIENT_CLINIC_OR_DEPARTMENT_OTHER): Payer: Medicaid Other | Attending: Internal Medicine

## 2015-12-03 DIAGNOSIS — L97511 Non-pressure chronic ulcer of other part of right foot limited to breakdown of skin: Secondary | ICD-10-CM | POA: Diagnosis not present

## 2015-12-03 DIAGNOSIS — L84 Corns and callosities: Secondary | ICD-10-CM | POA: Diagnosis not present

## 2015-12-03 DIAGNOSIS — Z8631 Personal history of diabetic foot ulcer: Secondary | ICD-10-CM | POA: Insufficient documentation

## 2015-12-03 DIAGNOSIS — G4730 Sleep apnea, unspecified: Secondary | ICD-10-CM | POA: Diagnosis not present

## 2015-12-03 DIAGNOSIS — B9562 Methicillin resistant Staphylococcus aureus infection as the cause of diseases classified elsewhere: Secondary | ICD-10-CM | POA: Insufficient documentation

## 2015-12-03 DIAGNOSIS — I509 Heart failure, unspecified: Secondary | ICD-10-CM | POA: Diagnosis not present

## 2015-12-03 DIAGNOSIS — E11621 Type 2 diabetes mellitus with foot ulcer: Secondary | ICD-10-CM | POA: Diagnosis not present

## 2015-12-03 DIAGNOSIS — K7030 Alcoholic cirrhosis of liver without ascites: Secondary | ICD-10-CM | POA: Diagnosis not present

## 2015-12-03 DIAGNOSIS — I110 Hypertensive heart disease with heart failure: Secondary | ICD-10-CM | POA: Diagnosis not present

## 2015-12-04 ENCOUNTER — Other Ambulatory Visit: Payer: Self-pay | Admitting: Internal Medicine

## 2015-12-04 DIAGNOSIS — M86171 Other acute osteomyelitis, right ankle and foot: Secondary | ICD-10-CM

## 2015-12-10 ENCOUNTER — Ambulatory Visit (HOSPITAL_COMMUNITY)
Admission: RE | Admit: 2015-12-10 | Discharge: 2015-12-10 | Disposition: A | Discharge status: Home / Self Care | Payer: Medicaid Other | Source: Ambulatory Visit | Attending: Family Medicine | Admitting: Family Medicine

## 2015-12-10 ENCOUNTER — Ambulatory Visit (HOSPITAL_COMMUNITY)
Admission: RE | Admit: 2015-12-10 | Discharge: 2015-12-10 | Disposition: A | Discharge status: Home / Self Care | Payer: Medicaid Other | Source: Ambulatory Visit | Attending: Internal Medicine | Admitting: Internal Medicine

## 2015-12-10 DIAGNOSIS — R918 Other nonspecific abnormal finding of lung field: Secondary | ICD-10-CM | POA: Diagnosis not present

## 2015-12-10 DIAGNOSIS — E11621 Type 2 diabetes mellitus with foot ulcer: Secondary | ICD-10-CM | POA: Diagnosis not present

## 2015-12-10 DIAGNOSIS — I517 Cardiomegaly: Secondary | ICD-10-CM | POA: Insufficient documentation

## 2015-12-10 DIAGNOSIS — M86171 Other acute osteomyelitis, right ankle and foot: Secondary | ICD-10-CM

## 2015-12-10 DIAGNOSIS — R0602 Shortness of breath: Secondary | ICD-10-CM | POA: Diagnosis present

## 2015-12-10 DIAGNOSIS — I509 Heart failure, unspecified: Secondary | ICD-10-CM | POA: Diagnosis not present

## 2015-12-10 DIAGNOSIS — M7989 Other specified soft tissue disorders: Secondary | ICD-10-CM | POA: Insufficient documentation

## 2015-12-11 MED FILL — PROAIR HFA 90 MCG INHALER: 108 (90 BAS | 17 days supply | Qty: 9 | Fill #1

## 2015-12-12 ENCOUNTER — Telehealth: Payer: Self-pay | Admitting: Family Medicine

## 2015-12-12 DIAGNOSIS — R319 Hematuria, unspecified: Secondary | ICD-10-CM

## 2015-12-12 DIAGNOSIS — R0602 Shortness of breath: Secondary | ICD-10-CM

## 2015-12-12 NOTE — Telephone Encounter (Signed)
Contacted concerning xray results. Will order right lateral decubitus films to better differentiate plerual effusion vs. lobar infiltrate. Reports he may have difficulty obtaining films before next office visit, but he will do his best. To follow up with Dr. Andria Frames on 12/13/14. Pending xray, may require CT scan, hopefully with contrast. Will recheck CMP tomorrow to follow up kidney and liver function.  Also of note, no Urology referral placed as noted in last office note. Will order again due to persistent hematuria.

## 2015-12-13 ENCOUNTER — Encounter: Payer: Self-pay | Admitting: Family Medicine

## 2015-12-13 ENCOUNTER — Ambulatory Visit (HOSPITAL_COMMUNITY)
Admission: RE | Admit: 2015-12-13 | Discharge: 2015-12-13 | Disposition: A | Discharge status: Home / Self Care | Payer: Medicaid Other | Source: Ambulatory Visit | Attending: Family Medicine | Admitting: Family Medicine

## 2015-12-13 ENCOUNTER — Ambulatory Visit (INDEPENDENT_AMBULATORY_CARE_PROVIDER_SITE_OTHER): Payer: Medicaid Other | Admitting: Family Medicine

## 2015-12-13 VITALS — BP 158/63 | HR 78 | Temp 97.8°F | Wt >= 6400 oz

## 2015-12-13 DIAGNOSIS — F331 Major depressive disorder, recurrent, moderate: Secondary | ICD-10-CM

## 2015-12-13 DIAGNOSIS — R0602 Shortness of breath: Secondary | ICD-10-CM | POA: Diagnosis present

## 2015-12-13 DIAGNOSIS — I10 Essential (primary) hypertension: Secondary | ICD-10-CM | POA: Diagnosis not present

## 2015-12-13 DIAGNOSIS — F339 Major depressive disorder, recurrent, unspecified: Secondary | ICD-10-CM | POA: Insufficient documentation

## 2015-12-13 DIAGNOSIS — J90 Pleural effusion, not elsewhere classified: Secondary | ICD-10-CM

## 2015-12-13 DIAGNOSIS — K7031 Alcoholic cirrhosis of liver with ascites: Secondary | ICD-10-CM

## 2015-12-13 DIAGNOSIS — E1100 Type 2 diabetes mellitus with hyperosmolarity without nonketotic hyperglycemic-hyperosmolar coma (NKHHC): Secondary | ICD-10-CM | POA: Diagnosis not present

## 2015-12-13 LAB — CBC
HCT: 41.8 % (ref 39.0–52.0)
HEMOGLOBIN: 14.3 g/dL (ref 13.0–17.0)
MCH: 31.6 pg (ref 26.0–34.0)
MCHC: 34.2 g/dL (ref 30.0–36.0)
MCV: 92.3 fL (ref 78.0–100.0)
MPV: 9.5 fL (ref 8.6–12.4)
PLATELETS: 425 10*3/uL — AB (ref 150–400)
RBC: 4.53 MIL/uL (ref 4.22–5.81)
RDW: 14.8 % (ref 11.5–15.5)
WBC: 8.1 10*3/uL (ref 4.0–10.5)

## 2015-12-13 LAB — PROTIME-INR
INR: 1.37 (ref <1.50)
PROTHROMBIN TIME: 17 s — AB (ref 11.6–15.2)

## 2015-12-13 LAB — COMPLETE METABOLIC PANEL WITH GFR
ALT: 31 U/L (ref 9–46)
AST: 40 U/L — ABNORMAL HIGH (ref 10–35)
Albumin: 2.7 g/dL — ABNORMAL LOW (ref 3.6–5.1)
Alkaline Phosphatase: 123 U/L — ABNORMAL HIGH (ref 40–115)
BUN: 18 mg/dL (ref 7–25)
CO2: 28 mmol/L (ref 20–31)
Calcium: 9.1 mg/dL (ref 8.6–10.3)
Chloride: 99 mmol/L (ref 98–110)
Creat: 0.86 mg/dL (ref 0.70–1.33)
GFR, Est African American: >89 mL/min (ref >=60)
GFR, Est Non African American: >89 mL/min (ref >=60)
Glucose, Bld: 146 mg/dL — ABNORMAL HIGH (ref 65–99)
Potassium: 4.3 mmol/L (ref 3.5–5.3)
Sodium: 135 mmol/L (ref 135–146)
Total Bilirubin: 0.7 mg/dL (ref 0.2–1.2)
Total Protein: 7.4 g/dL (ref 6.1–8.1)

## 2015-12-13 MED ORDER — FUROSEMIDE 40 MG PO TABS: 40.0000 mg | ORAL_TABLET | Freq: Three times a day (TID) | ORAL | Status: DC

## 2015-12-13 MED FILL — FUROSEMIDE 40 MG TABLET: 40 | 90 days supply | Qty: 270 | Fill #0

## 2015-12-13 NOTE — Patient Instructions (Signed)
See Dr. Gerlean Ren next week. I will call Monday with the blood test and X ray results You MUST change your diet.  You have gained 48 lbs in less than 2 months.   Start taking lasix three times per day. Keep up the counseling.

## 2015-12-13 NOTE — Assessment & Plan Note (Signed)
A1C was OK in Jan before his profound wt gain.  Will check only with the random blood sugar on his cmp.

## 2015-12-13 NOTE — Progress Notes (Signed)
   Subjective:    Patient ID: Matthew [name], male    DOB: [birth year], 60 y.o.   MRN: [id number]  HPI FU, shortness of breath.  Oh my, Matthew [name] has gained 48 lbs in less than two months.  He admits to bad dietary habits - he is ordering pizza and subs almost every night.  States lasix still works giving a brisk diuresis for an hour or so after taking.   Depression is real.  He feels isolated.  Still sober after one year.  Sister, who was a family 42, is seriously ill with sepsis and may be dying.  No SI or HI.  He wants to live especially for his 34 year old son.   No dyspnea at rest.  Sig dyspnea on exertion.  Had chest x ray showing likely large right sided effusion.  Decubitis film ordered but not yet done. No recent creat, coags, alb, or LFTs.    Review of Systems     Objective:   Physical Exam 48 lb weight gain!!! Diminished breath sounds in bases bilaterally Rt>Lt Abd non tender - enormous Ext 4 + edema, chronic venous stasis changes bilaterally.  No skin breakdown.       Assessment & Plan:

## 2015-12-13 NOTE — Assessment & Plan Note (Signed)
In counseling.  Clearly contributing to his non compliance.  Sister's unexpected illness weighs heavily on him

## 2015-12-13 NOTE — Assessment & Plan Note (Signed)
Markedly volume overloaded which likely accounts for most of the 50 lb wt gain.  Start aggressive diuresis and

## 2015-12-13 NOTE — Assessment & Plan Note (Signed)
Almost certainly due to severe cirrhosis.  Await decub film to confirm.

## 2015-12-18 ENCOUNTER — Telehealth: Payer: Self-pay | Admitting: Family Medicine

## 2015-12-18 NOTE — Telephone Encounter (Signed)
Pt called because he was wanting his x-ray results. Please call to let him know. He would also like to speak to Dr. Gerlean Ren about his fluid pills. jw

## 2015-12-19 NOTE — Telephone Encounter (Signed)
Contacted patient to let him know xray results. Has been taking Lasix as instructed and notes increased urination. States there is no way he can come to appointment tomorrow, reporting his legs are too swollen to drive and he doesn't have anyone who can bring him to the appointment. Would like to be seen on Monday, 3/27 by either myself, Dr. Andria Frames, or Dr. Lonny Prude. Except for appointments this week, soonest availability is on Wednesday, 3/29 with Dr. Andria Frames. Offered appointments with a variety of providers on 3/27 but refuses to be seen by anyone else. Worried that he will end up in the hospital if he waits too long to be seen.   Denies any worsening in swelling, stating it is mildly improved from his last office visit. Denies any changes in shortness of breath.

## 2015-12-20 ENCOUNTER — Ambulatory Visit: Payer: Self-pay | Admitting: Family Medicine

## 2015-12-25 ENCOUNTER — Telehealth: Payer: Self-pay | Admitting: Family Medicine

## 2015-12-25 DIAGNOSIS — K7031 Alcoholic cirrhosis of liver with ascites: Secondary | ICD-10-CM

## 2015-12-25 NOTE — Telephone Encounter (Signed)
Need to speak with Dr. Andria Frames regarding appt he have with him tomorrow.  Please call back at earliest convenience.

## 2015-12-25 NOTE — Telephone Encounter (Signed)
Want to know if provider would put in order for labs and schedule appt to see nurse for bp check

## 2015-12-25 NOTE — Telephone Encounter (Signed)
He has an appointment with me tomorrow - but may have trouble keeping it due to transportation issues.   He remains quite short of breath.   I don't have reliable information to tell if he has lost weight. Stressed how important it is for him to keep the appointment.  A poor second best option is for him to come in for a nurse visit when transport is available.  He would need VS check including weight and pulse ox.  Also need labs: BMP to check creat and K.  I will enter these as future orders if he cannot make the appointment.

## 2015-12-26 ENCOUNTER — Ambulatory Visit: Payer: Self-pay | Admitting: Family Medicine

## 2015-12-26 NOTE — Telephone Encounter (Signed)
During nurse visit, please document weight and vital signs.  Lab orders entered.

## 2015-12-31 ENCOUNTER — Encounter (HOSPITAL_BASED_OUTPATIENT_CLINIC_OR_DEPARTMENT_OTHER): Payer: Self-pay | Attending: Internal Medicine

## 2015-12-31 DIAGNOSIS — I110 Hypertensive heart disease with heart failure: Secondary | ICD-10-CM | POA: Insufficient documentation

## 2015-12-31 DIAGNOSIS — Z87891 Personal history of nicotine dependence: Secondary | ICD-10-CM | POA: Insufficient documentation

## 2015-12-31 DIAGNOSIS — L03119 Cellulitis of unspecified part of limb: Secondary | ICD-10-CM | POA: Insufficient documentation

## 2015-12-31 DIAGNOSIS — I509 Heart failure, unspecified: Secondary | ICD-10-CM | POA: Insufficient documentation

## 2015-12-31 DIAGNOSIS — G4730 Sleep apnea, unspecified: Secondary | ICD-10-CM | POA: Insufficient documentation

## 2015-12-31 DIAGNOSIS — E11621 Type 2 diabetes mellitus with foot ulcer: Secondary | ICD-10-CM | POA: Insufficient documentation

## 2015-12-31 DIAGNOSIS — L97511 Non-pressure chronic ulcer of other part of right foot limited to breakdown of skin: Secondary | ICD-10-CM | POA: Insufficient documentation

## 2016-01-01 MED FILL — PANTOPRAZOLE SOD DR 40 MG T: 40 | 30 days supply | Qty: 30 | Fill #2

## 2016-01-02 ENCOUNTER — Ambulatory Visit: Payer: Self-pay | Admitting: Family Medicine

## 2016-01-02 ENCOUNTER — Telehealth: Payer: Self-pay | Admitting: Family Medicine

## 2016-01-02 MED FILL — SPIRONOLACTONE 50 MG TABLET: 50 | 30 days supply | Qty: 60 | Fill #3

## 2016-01-02 NOTE — Telephone Encounter (Signed)
Pt would like to speak to Dr. Andria Frames. He had an appointment today and canceled. He said that his Medicaid had been canceled. jw

## 2016-01-02 NOTE — Telephone Encounter (Signed)
Called.  He will reapply.  Strongly encouraged to get seen ASAP.  I/we care more about his health and keeping him out of hospital than about reimbursement.

## 2016-01-03 ENCOUNTER — Encounter: Payer: Self-pay | Admitting: Family Medicine

## 2016-01-03 ENCOUNTER — Ambulatory Visit (INDEPENDENT_AMBULATORY_CARE_PROVIDER_SITE_OTHER): Payer: Self-pay | Admitting: Family Medicine

## 2016-01-03 VITALS — BP 141/60 | HR 80 | Temp 97.7°F | Ht 70.0 in | Wt >= 6400 oz

## 2016-01-03 DIAGNOSIS — I10 Essential (primary) hypertension: Secondary | ICD-10-CM

## 2016-01-03 DIAGNOSIS — J90 Pleural effusion, not elsewhere classified: Secondary | ICD-10-CM

## 2016-01-03 DIAGNOSIS — F331 Major depressive disorder, recurrent, moderate: Secondary | ICD-10-CM

## 2016-01-03 DIAGNOSIS — K7031 Alcoholic cirrhosis of liver with ascites: Secondary | ICD-10-CM

## 2016-01-03 LAB — BASIC METABOLIC PANEL
BUN: 15 mg/dL (ref 7–25)
CO2: 33 mmol/L — AB (ref 20–31)
CREATININE: 0.87 mg/dL (ref 0.70–1.33)
Calcium: 8.9 mg/dL (ref 8.6–10.3)
Chloride: 99 mmol/L (ref 98–110)
Glucose, Bld: 94 mg/dL (ref 65–99)
Potassium: 4.2 mmol/L (ref 3.5–5.3)
SODIUM: 140 mmol/L (ref 135–146)

## 2016-01-03 MED ORDER — TORSEMIDE 100 MG PO TABS: 100.0000 mg | ORAL_TABLET | Freq: Two times a day (BID) | ORAL | Status: DC

## 2016-01-03 MED FILL — TORSEMIDE 100 MG TABLET: 100 | 30 days supply | Qty: 60 | Fill #0

## 2016-01-03 NOTE — Assessment & Plan Note (Signed)
Driving many of his non compliant behaviors.  His strategy for improving is to get back into counseling and have more social contact.

## 2016-01-03 NOTE — Patient Instructions (Signed)
Stop the lasix/furosemide.  Start the torsemide - a more potent fluid pill. I will call with the blood work results. Start going back to [employer] and counseling. Your actions need to start matching your words.

## 2016-01-03 NOTE — Progress Notes (Signed)
   Subjective:    Patient ID: Matthew [name], male    DOB: April 16, 1956, 60 y.o.   MRN: [id number]  HPI After a couple of missed appointments, Matthew [name] finally returns for recheck of his anasarca from [facility] and his right pleural effusion.  He is contrite as usual.  Admits to significant dietary indiscretion both in total calories and especially in sodium  He remains alcohol free.    He is not quite due for A1C but last DM showed great control.  I gently confronted him on the issue that he says all the right things in the office, and then goes home and has non compliant, self-destructive behavior.  He wants to live for his son.  He has socially isolated himself.  He has stopped going to [employer].  He admits that depression is strongly driving these behaviors.  He often feels hopeless which then fuels dietary indiscretion.  Not suicidal or homicidal.      Review of Systems     Objective:   Physical Exam  Affect OK in office.   Wt is even worse than one month ago and represents an 80 lb wt gain since last Oct.  Most is fluid although with his diet of pizza and subs, I am sure some is true adipose wt gain. Lungs dull in right base Cardiac RRR without m or g Abd massive distention, ascites. Legs 3+ edema with chronic hemosiderin changes.    > 50% counseling.  Duration of visit was 35 minutes.       Assessment & Plan:

## 2016-01-03 NOTE — Assessment & Plan Note (Signed)
Worse.  Although how much worse is difficult to tell with anasarca.

## 2016-01-03 NOTE — Assessment & Plan Note (Signed)
I am certain still present since no significant diuresis since last visit.

## 2016-01-03 NOTE — Assessment & Plan Note (Signed)
Worse.  Desperately needs diuresis.  Switch furosemide to torsemide.

## 2016-01-17 ENCOUNTER — Ambulatory Visit (INDEPENDENT_AMBULATORY_CARE_PROVIDER_SITE_OTHER): Payer: Self-pay | Admitting: Family Medicine

## 2016-01-17 ENCOUNTER — Encounter: Payer: Self-pay | Admitting: Family Medicine

## 2016-01-17 VITALS — BP 135/63 | HR 87 | Temp 98.6°F | Ht 70.0 in | Wt 397.1 lb

## 2016-01-17 DIAGNOSIS — K7031 Alcoholic cirrhosis of liver with ascites: Secondary | ICD-10-CM

## 2016-01-17 DIAGNOSIS — L03115 Cellulitis of right lower limb: Secondary | ICD-10-CM

## 2016-01-17 DIAGNOSIS — I10 Essential (primary) hypertension: Secondary | ICD-10-CM

## 2016-01-17 DIAGNOSIS — L97519 Non-pressure chronic ulcer of other part of right foot with unspecified severity: Secondary | ICD-10-CM

## 2016-01-17 DIAGNOSIS — E1100 Type 2 diabetes mellitus with hyperosmolarity without nonketotic hyperglycemic-hyperosmolar coma (NKHHC): Secondary | ICD-10-CM

## 2016-01-17 DIAGNOSIS — E11621 Type 2 diabetes mellitus with foot ulcer: Secondary | ICD-10-CM

## 2016-01-17 LAB — BASIC METABOLIC PANEL
BUN: 27 mg/dL — ABNORMAL HIGH (ref 7–25)
CALCIUM: 8.5 mg/dL — AB (ref 8.6–10.3)
CO2: 27 mmol/L (ref 20–31)
Chloride: 95 mmol/L — ABNORMAL LOW (ref 98–110)
Creat: 1.13 mg/dL (ref 0.70–1.33)
GLUCOSE: 100 mg/dL — AB (ref 65–99)
Potassium: 3.6 mmol/L (ref 3.5–5.3)
SODIUM: 137 mmol/L (ref 135–146)

## 2016-01-17 LAB — POCT GLYCOSYLATED HEMOGLOBIN (HGB A1C): HEMOGLOBIN A1C: 6

## 2016-01-17 MED ORDER — CLINDAMYCIN HCL 300 MG PO CAPS: 300.0000 mg | ORAL_CAPSULE | Freq: Four times a day (QID) | ORAL | Status: DC

## 2016-01-17 MED FILL — CLINDAMYCIN HCL 300 MG CAP: 300 | 10 days supply | Qty: 40 | Fill #0

## 2016-01-17 NOTE — Patient Instructions (Signed)
Keep appointment tomorrow with wound care. I sent in rx for antibiotic.  Call if spreading redness or fever. Continue other meds the same Keep working on diet, decreased calories, decreased salt/sodium.

## 2016-01-17 NOTE — Assessment & Plan Note (Signed)
Great control. 

## 2016-01-17 NOTE — Progress Notes (Signed)
   Subjective:    Patient ID: EMERIC WINHAM, male    DOB: 08/21/1956, 60 y.o.   MRN: [id number]  HPI Multiple issues: ESLD with cirrhosis causing anasarca.  Finally getting some diuresis with torsemide.  He is still 30 lbs above his dry weight. Wound of right foot due to pressure ulcer/DM.  [facility] one [facility].  Tight and uncomfortable on leg.  Also marked increase redness above wrap.  "It looks like I have started my cycle of cellulitis again."  Has appointment with wound care tomorrow. Still SOB.  Hx of right pleural effusion.  While he is down 18 lbs, he remains 30 lbs above his dry wt. Staying sobor.   DM due for A1C, which is nicely at goal.     Review of Systems     Objective:   Physical Exam Anasarca.   Diminished breath sounds at right base.   Left leg, chronic venous insufficiency changes. Right leg.  Wrap removed.  Significant redness over an above his chronic venous insufficiency.  Wound has minimal drainage.          Assessment & Plan:

## 2016-01-17 NOTE — Assessment & Plan Note (Signed)
Treat with oral clinda.  Emphasized that he needs to keep appointment with wound care.

## 2016-01-17 NOTE — Assessment & Plan Note (Signed)
Ulcer looks OK with decent granulation tissue.  Emphasized to keep appointment with wound care.

## 2016-01-17 NOTE — Assessment & Plan Note (Signed)
Finally some diuresis.  Need to check BMP to make sure not harming renal function.

## 2016-01-17 NOTE — Assessment & Plan Note (Signed)
States that he is now watching his diet.  He thinks some of weight loss is lean body mass.

## 2016-01-22 ENCOUNTER — Telehealth: Payer: Self-pay | Admitting: *Deleted

## 2016-01-22 NOTE — Telephone Encounter (Signed)
LVM for pt to call office back to remind him of an upcoming appointment with Dr. Andria Frames on 01/24/2016.  Katharina Caper, Xian Apostol D, [place]

## 2016-01-24 ENCOUNTER — Encounter: Payer: Self-pay | Admitting: Family Medicine

## 2016-01-24 ENCOUNTER — Ambulatory Visit (INDEPENDENT_AMBULATORY_CARE_PROVIDER_SITE_OTHER): Payer: Self-pay | Admitting: Family Medicine

## 2016-01-24 DIAGNOSIS — E11621 Type 2 diabetes mellitus with foot ulcer: Secondary | ICD-10-CM

## 2016-01-24 DIAGNOSIS — L03115 Cellulitis of right lower limb: Secondary | ICD-10-CM

## 2016-01-24 DIAGNOSIS — K7031 Alcoholic cirrhosis of liver with ascites: Secondary | ICD-10-CM

## 2016-01-24 DIAGNOSIS — F331 Major depressive disorder, recurrent, moderate: Secondary | ICD-10-CM

## 2016-01-24 DIAGNOSIS — L97519 Non-pressure chronic ulcer of other part of right foot with unspecified severity: Secondary | ICD-10-CM

## 2016-01-24 MED ORDER — HYDROCHLOROTHIAZIDE 12.5 MG PO TABS: 12.5000 mg | ORAL_TABLET | Freq: Every day | ORAL | Status: DC

## 2016-01-24 MED FILL — HYDROCHLOROTHIAZIDE 12.5 MG: 12.5 | 90 days supply | Qty: 90 | Fill #0

## 2016-01-24 NOTE — Progress Notes (Signed)
Dr. Andria Frames requested a [place].   Presenting Issue:  Matthew [name] is in a bad place physically and psychosocially.  This is the "saddest" Dr. Andria Frames has seen him.  Handoff took place in the room.  Suspect reason was to help identify any potential places for improvement in psychosocial functioning.  Report of symptoms:  Sad.  Short of breath.    Duration of CURRENT symptoms:  Sounds like downward spiral of alcoholism and other serious health problems (septic shock, cirrhosis) started in 2014.  Has had some better moments since then but at another low point with not being able to get this fluid cleared.  Age of onset of first mood disturbance:  Did not assess.  Impact on function:  Sounds like he has trouble functioning at all.  Did not assess specific things today although Dr. Andria Frames mentioned he can not cook for himself.  He hasn't driven for over a month.  Does not go anywhere.    Psychiatric History - Diagnoses: Depression and alcohol abuse per chart - Hospitalizations: Was treated at [facility] but was discharged secondary to being too (physically) ill.  Multiple hospitalizations at [facility].  Was admitted on 07/18/2014 for intentional overdose.  He was getting evicted from his house and felt abandoned by people.  Reported that he was trying to get attention and did not want to kill himself.   - Pharmacotherapy: Celexa 20 mg is on his med list - Outpatient therapy: Saw a counselor through [employer].  Did not think she had the experience (drug / alcohol related) to truly understand him.  Found some of what she provided helpful.  Was disappointed that she did not follow-up with him, especially after he called her when his sister died.    Family history of psychiatric issues:  Unclear.  Sounds like family relationships were good.  He is divorced.  Got custody of his son when he was 3.  After his father died, his mom moved in with him and helped him raise his son.   She died two years ago.     Family:  59 year old son is a Freight forwarder at a [employer].  This is Matthew [name]'s reason for being.  Son deteriorated considerably after the death of his grandmother.  When Matthew [name] lost his house and car, he had his son move to [place] to live with his mother.  Not sure of this status now.    Current and history of substance use:  Denies current use of alcohol.  Significant alcohol use previously.  Medical conditions that might explain or contribute to symptoms:  Marked.  See Dr. Andria Frames note.    PHQ-9:  PHQ-2 given when roomed was 0 which tells me he did not want to discuss it with clinic staff.  Would suspect he is experiencing depressed mood and anhedonia every day as well as a host of other depressive symptoms.    Assessment / Plan / Recommendations: He is limited in so many ways.  The shortness of breath makes it especially hard to move around it sounds like.  I would like a clearer sense of how he spends his days and what we might be able to do there that could bring him a bit more sense of purpose.  Based on his report, he was an "affluent" person prior to losing his job when he got so ill.  This seems relatively recent to me although the alcohol abuse surely predated this.  Some personality characteristics  may make therapy a challenge - especially short-term therapy like [facility] provides.  I wonder about home based therapy or services that might bring him some connection.  Will check with Casimer Lanius, LCSW about this.    He is to follow with Dr. Andria Frames 5/3 at 2:30.  I will plan to meet with him around that time as well.

## 2016-01-24 NOTE — Patient Instructions (Signed)
One more fluid pill at the pharmacy.  Stay on all the same meds. See me or Dr. Gerlean Ren in one week.

## 2016-01-25 NOTE — Assessment & Plan Note (Signed)
Add HCTZ for further diuresis.  Need BMP next visit.

## 2016-01-25 NOTE — Assessment & Plan Note (Signed)
I am concerned with the helplessness, hopelessness.  Appreciate behavioral med help.

## 2016-01-25 NOTE — Assessment & Plan Note (Signed)
Dressed and followed by wound care. Did not undress

## 2016-01-25 NOTE — Assessment & Plan Note (Signed)
Wt gain is both fluid and likely adipose due to dietary indiscretion.

## 2016-01-25 NOTE — Progress Notes (Signed)
   Subjective:    Patient ID: Matthew [name], male    DOB: 10-05-1955, 60 y.o.   MRN: [id number]  HPI I am not sure if I am taking two steps forward and one step back or if it is more on step forward and two back.   I thought we we making progress las visit with decreased weight and stable(ish) renal function on demadex.  Yes, he had cellulitis of right leg.    Today, his weight is back up 10 lbs.  SOB is real.  Dietary indiscretion is worse than last week.  ("It is difficult for me to prepare food when I am short of breath.")  He has not asked for low sodium, heart healthy menu options at the sub shop that delivers for him.    And he is more depressed.  Death of mother and sister have hit him hard.  The last two years have been both and emotional struggle and a physical one.  He remains sober.  He is not suicidal but states he wished he lived in a state that allow physician assisted suicide.  I asked integrated psychologist to come in during visit.  At one point, he said he had no one.  Then admitted that he had his son, who needed him.  Right leg is less painful and swollen.  He has a couple of days of the clinda left.  Denies diarrhea.    Review of Systems     Objective:   Physical Exam Anasarca.   Lungs diminished breath sounds in both bases.   Right leg is less red - returning to appearance of chronic venous insufficiency.       Assessment & Plan:

## 2016-01-25 NOTE — Assessment & Plan Note (Signed)
Improving with antibiotics.  With anasarca and open wound, at high risk of recurrance.

## 2016-01-30 ENCOUNTER — Encounter: Payer: Self-pay | Admitting: Family Medicine

## 2016-01-30 ENCOUNTER — Ambulatory Visit (INDEPENDENT_AMBULATORY_CARE_PROVIDER_SITE_OTHER): Payer: Self-pay | Admitting: Family Medicine

## 2016-01-30 VITALS — BP 144/66 | HR 74 | Temp 98.1°F | Ht 70.0 in | Wt 389.2 lb

## 2016-01-30 DIAGNOSIS — I10 Essential (primary) hypertension: Secondary | ICD-10-CM

## 2016-01-30 DIAGNOSIS — R0602 Shortness of breath: Secondary | ICD-10-CM

## 2016-01-30 DIAGNOSIS — F331 Major depressive disorder, recurrent, moderate: Secondary | ICD-10-CM

## 2016-01-30 DIAGNOSIS — K7031 Alcoholic cirrhosis of liver with ascites: Secondary | ICD-10-CM

## 2016-01-30 LAB — BASIC METABOLIC PANEL
BUN: 29 mg/dL — ABNORMAL HIGH (ref 7–25)
CALCIUM: 9.5 mg/dL (ref 8.6–10.3)
CO2: 37 mmol/L — ABNORMAL HIGH (ref 20–31)
Chloride: 92 mmol/L — ABNORMAL LOW (ref 98–110)
Creat: 1.14 mg/dL (ref 0.70–1.33)
Glucose, Bld: 135 mg/dL — ABNORMAL HIGH (ref 65–99)
POTASSIUM: 4.3 mmol/L (ref 3.5–5.3)
SODIUM: 137 mmol/L (ref 135–146)
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
## 2016-01-30 NOTE — Patient Instructions (Signed)
I will call with lab results.  If your kidneys are OK, you will stay on all the same medications.   Congrats on the diet changes, they are working.   See me in two weeks.

## 2016-01-31 NOTE — Assessment & Plan Note (Signed)
Improved

## 2016-01-31 NOTE — Progress Notes (Signed)
   Subjective:    Patient ID: Matthew [name], male    DOB: 05-11-1956, 60 y.o.   MRN: [id number]  HPI  The yo yo continues.  Fortunately, we are on a big upswing this week.  He has gotten serious about diet.  He is taking all his diuretics.  He feels better, less short of breath.  He is also emotionally in a better state.  He believes he can continue this upswing  Dry wt is uncertain - somewhere between 330 and 360.  We are not there yet  No SI or HI    Review of Systems     Objective:   Physical Exam Lungs clear,  Decreased BS in bases Ext still 3+ edema.        Assessment & Plan:

## 2016-01-31 NOTE — Assessment & Plan Note (Signed)
Still not near dry wt but improved.   FU 2 weeks, check BMP today to monitor renal function.

## 2016-02-01 ENCOUNTER — Encounter (HOSPITAL_BASED_OUTPATIENT_CLINIC_OR_DEPARTMENT_OTHER): Payer: Self-pay | Attending: Internal Medicine

## 2016-02-01 DIAGNOSIS — G4730 Sleep apnea, unspecified: Secondary | ICD-10-CM | POA: Insufficient documentation

## 2016-02-01 DIAGNOSIS — L97511 Non-pressure chronic ulcer of other part of right foot limited to breakdown of skin: Secondary | ICD-10-CM | POA: Insufficient documentation

## 2016-02-01 DIAGNOSIS — I110 Hypertensive heart disease with heart failure: Secondary | ICD-10-CM | POA: Insufficient documentation

## 2016-02-01 DIAGNOSIS — Z79899 Other long term (current) drug therapy: Secondary | ICD-10-CM | POA: Insufficient documentation

## 2016-02-01 DIAGNOSIS — I509 Heart failure, unspecified: Secondary | ICD-10-CM | POA: Insufficient documentation

## 2016-02-01 DIAGNOSIS — E11621 Type 2 diabetes mellitus with foot ulcer: Secondary | ICD-10-CM | POA: Insufficient documentation

## 2016-02-01 DIAGNOSIS — L84 Corns and callosities: Secondary | ICD-10-CM | POA: Insufficient documentation

## 2016-02-01 DIAGNOSIS — K7030 Alcoholic cirrhosis of liver without ascites: Secondary | ICD-10-CM | POA: Insufficient documentation

## 2016-02-01 DIAGNOSIS — Z87891 Personal history of nicotine dependence: Secondary | ICD-10-CM | POA: Insufficient documentation

## 2016-02-04 ENCOUNTER — Other Ambulatory Visit: Payer: Self-pay | Admitting: Family Medicine

## 2016-02-04 MED FILL — TORSEMIDE 100 MG TABLET: 100 | 30 days supply | Qty: 60 | Fill #1

## 2016-02-04 MED FILL — SPIRONOLACTONE 50 MG TABLET: 50 | 30 days supply | Qty: 60 | Fill #0

## 2016-02-14 ENCOUNTER — Ambulatory Visit (INDEPENDENT_AMBULATORY_CARE_PROVIDER_SITE_OTHER): Payer: Self-pay | Admitting: Family Medicine

## 2016-02-14 ENCOUNTER — Encounter: Payer: Self-pay | Admitting: Family Medicine

## 2016-02-14 VITALS — BP 128/63 | HR 80 | Temp 98.2°F | Ht 70.0 in | Wt 393.7 lb

## 2016-02-14 DIAGNOSIS — E1100 Type 2 diabetes mellitus with hyperosmolarity without nonketotic hyperglycemic-hyperosmolar coma (NKHHC): Secondary | ICD-10-CM

## 2016-02-14 DIAGNOSIS — E11621 Type 2 diabetes mellitus with foot ulcer: Secondary | ICD-10-CM

## 2016-02-14 DIAGNOSIS — R0602 Shortness of breath: Secondary | ICD-10-CM

## 2016-02-14 DIAGNOSIS — I480 Paroxysmal atrial fibrillation: Secondary | ICD-10-CM

## 2016-02-14 DIAGNOSIS — F331 Major depressive disorder, recurrent, moderate: Secondary | ICD-10-CM

## 2016-02-14 DIAGNOSIS — Z23 Encounter for immunization: Secondary | ICD-10-CM

## 2016-02-14 DIAGNOSIS — L97519 Non-pressure chronic ulcer of other part of right foot with unspecified severity: Secondary | ICD-10-CM

## 2016-02-14 NOTE — Progress Notes (Signed)
Reason for follow-up:  Continued work on coping, finding and identity, and managing serious chronic illness.  Issues discussed:  His relationship with his son.  In my mind - he is being less than honest with himself and his son.  A more honest (genuine, authentic) relationship, while potentially painful, will ultimately be more rewarding.  I think he agrees with this.  His identity.  He named father.  I named "patient."  He would like others.  Searching for one may be tricky.  Identifying what he needs (support system) might lead to one (friend).    Identified goals:  Three goals identified.  See patient instructions.  Will see back in two weeks on heels of [facility] appointment.

## 2016-02-14 NOTE — Patient Instructions (Addendum)
See me in two weeks - Appointment scheduled for June 1st at 10:00 - to see Matthew [name] afterward (around 10:30). You will get a pneumovax today. Stop the aspirin Keep working on the diet.   Stay on all your same meds.  Per Dr. Gwenlyn [name]: 1.  What is a broader definition of "It will be okay."  2.  Getting a support system  - what steps to take to make it happen? 3.  Plan for son - communication / what you hope for him remembering that ultimately, he is in charge of the outcome for his life

## 2016-02-15 NOTE — Assessment & Plan Note (Signed)
Still DOE but at a stable level for last 3 weeks.

## 2016-02-15 NOTE — Assessment & Plan Note (Signed)
We discussed this.  He is on ASA.  Also reviewed that he has mild hepatic coagulopathy. (INR 1.3 with typical goal INR of a fib being 2-3.)  I am concerned about bleeding.  Stop ASA.  Consider anticoagulated due to liver disease.

## 2016-02-15 NOTE — Assessment & Plan Note (Signed)
Unchanged.  Once again emphasized diet.

## 2016-02-15 NOTE — Assessment & Plan Note (Signed)
See Dr. Gwenlyn Saran note of integrated care.

## 2016-02-15 NOTE — Progress Notes (Signed)
   Subjective:    Patient ID: Matthew [name], male    DOB: [birth year], 60 y.o.   MRN: [id number]  HPI FU cirrhosis, ascites, anasarca.   Patient states he has been largely compliant with his diet.  He is using up some likely high sodium lunch meats from his fridge.  He states he cannot afford to throw anything away.  He is depressed that he has gained wt since last visit 2 weeks ago.  States he has been compliant with meds.  Not ordering out high sodium food.  Not drinking.  Sober for 1 year.  BMP last visit, OK. Depression, still present.  He is seen by integrated care today.  Not actively suicidal.  Hoping to get his son back living with him this summer.   Right leg wound, cellulitis.  Followed by wound care.  Has una boot on.  No fever or redness about una boot.  Leg discomfort is at his baseline.     Review of Systems     Objective:   Physical Exam Weight up 4 lbs. Lungs clear Massive panus, ascites 4+ bilateral edema. Right leg una boot with no redness above.  Toes pink with good cap refill       Assessment & Plan:

## 2016-02-15 NOTE — Assessment & Plan Note (Signed)
Seems stable.  Followed by wound care.  No evidence of active cellulitis off antibiotics.

## 2016-02-28 ENCOUNTER — Encounter: Payer: Self-pay | Admitting: Family Medicine

## 2016-02-28 ENCOUNTER — Ambulatory Visit (INDEPENDENT_AMBULATORY_CARE_PROVIDER_SITE_OTHER): Payer: Self-pay | Admitting: Family Medicine

## 2016-02-28 ENCOUNTER — Encounter (HOSPITAL_BASED_OUTPATIENT_CLINIC_OR_DEPARTMENT_OTHER): Payer: Self-pay | Attending: Internal Medicine

## 2016-02-28 VITALS — BP 112/54 | HR 80 | Temp 98.3°F | Ht 70.0 in | Wt 393.6 lb

## 2016-02-28 DIAGNOSIS — N179 Acute kidney failure, unspecified: Secondary | ICD-10-CM

## 2016-02-28 DIAGNOSIS — L97519 Non-pressure chronic ulcer of other part of right foot with unspecified severity: Secondary | ICD-10-CM

## 2016-02-28 DIAGNOSIS — Z8631 Personal history of diabetic foot ulcer: Secondary | ICD-10-CM | POA: Insufficient documentation

## 2016-02-28 DIAGNOSIS — E11621 Type 2 diabetes mellitus with foot ulcer: Secondary | ICD-10-CM | POA: Insufficient documentation

## 2016-02-28 DIAGNOSIS — R600 Localized edema: Secondary | ICD-10-CM

## 2016-02-28 DIAGNOSIS — I480 Paroxysmal atrial fibrillation: Secondary | ICD-10-CM

## 2016-02-28 DIAGNOSIS — K7031 Alcoholic cirrhosis of liver with ascites: Secondary | ICD-10-CM

## 2016-02-28 DIAGNOSIS — K7030 Alcoholic cirrhosis of liver without ascites: Secondary | ICD-10-CM | POA: Insufficient documentation

## 2016-02-28 DIAGNOSIS — G4730 Sleep apnea, unspecified: Secondary | ICD-10-CM | POA: Insufficient documentation

## 2016-02-28 DIAGNOSIS — R0602 Shortness of breath: Secondary | ICD-10-CM

## 2016-02-28 DIAGNOSIS — L97511 Non-pressure chronic ulcer of other part of right foot limited to breakdown of skin: Secondary | ICD-10-CM | POA: Insufficient documentation

## 2016-02-28 DIAGNOSIS — Z87891 Personal history of nicotine dependence: Secondary | ICD-10-CM | POA: Insufficient documentation

## 2016-02-28 DIAGNOSIS — L84 Corns and callosities: Secondary | ICD-10-CM | POA: Insufficient documentation

## 2016-02-28 DIAGNOSIS — I10 Essential (primary) hypertension: Secondary | ICD-10-CM | POA: Insufficient documentation

## 2016-02-28 DIAGNOSIS — F331 Major depressive disorder, recurrent, moderate: Secondary | ICD-10-CM

## 2016-02-28 LAB — PROTIME-INR
INR: 1.28 (ref <1.50)
Prothrombin Time: 16.2 seconds — ABNORMAL HIGH (ref 11.6–15.2)

## 2016-02-28 NOTE — Assessment & Plan Note (Signed)
Seems improved despite wt being unchanged.  Three possible explanations: 1. I am not remembering correctly and the edema is actually unchanged 2. He has worsening obesity and that his dry wt is higher than I currently suspect 3. He has less peripheral edema but more central edema (ascites).  Very difficult to tell by exam.

## 2016-02-28 NOTE — Patient Instructions (Addendum)
Stay on the same medicine.   I will call with the lab results. If things seem to be going well, we can stretch the next visit to four weeks.  Make it two weeks if there any problem.  From Dr. Gwenlyn Saran:  Doristine Devoid progress with your goals.  Sounds like you have a plan regarding how you will communicate with [name].  With regards to support system, you agreed to attend an [place] next week to dip your toe back in that water.  I am excited to hear how it went. Please watch the Ted Talk by Almond Lint on vulnerability.  I think it will be useful.  We can discuss next time I see you which is June 15th at 10:15.

## 2016-02-28 NOTE — Assessment & Plan Note (Signed)
Improved

## 2016-02-28 NOTE — Assessment & Plan Note (Signed)
Recheck INR to make sure that I know his baseline from the liver disease.

## 2016-02-28 NOTE — Assessment & Plan Note (Signed)
Improved.  Likely improved pleural effusion.  Will not check CXR

## 2016-02-28 NOTE — Assessment & Plan Note (Addendum)
Recheck labs.  Is this stable, improving or slowly worsening.  He has not been taking his lactulose and has not had a recurrance of his encephalopathy. Given rise in creat, will decrease HCTZ to every third day.   Hepatocellular function seems OK.  May need to repeat ultrasound to reassess the cirrhosis/portal hypertension.

## 2016-02-28 NOTE — Progress Notes (Signed)
Reason for follow-up:  Continued to work on mood / identity management and goal identification.  Issues discussed:  He remembered his homework from last time.  Forgot his journal at home.  His revised statement of "what does 'okay' mean" was assuring his son that he [name]) will do whatever he can to stay around as long as he can.  His hope is to hlep get Matthew [name] a [employer] after the chaos of the last few years.  Social support.  Matthew [name]'s plan is to reengage with the [place] community.  [name], an acquaintance, is heavily involved and he thinks she will drive him to a meeting this next week.     Finally, we revisited the integrated care model.  He thinks he does best with a "free flowing" therapy.  Not entirely sure what that means but it sounds more like a traditional model than what is offered in the [facility].  I provided some more education about our model and suggested that for now at least, it is a good starting point.  We will need to talk about a referral to more traditional therapy if he thinks that is what he needs.    Identified goals:  Attend an [place] meeting.  [place] talk.

## 2016-02-28 NOTE — Progress Notes (Signed)
   Subjective:    Patient ID: Matthew [name], male    DOB: May 20, 1956, 60 y.o.   MRN: [id number]  HPI FU anasarca, depression and right leg wound. Anasarca 2nd to cirrhosis.  Wt is stable but seems well above his dry wt.  Dry wt is difficult to determine but likely in the 360 range.  No side effects with meds.  DOE seems better. Depression: Has improved.  States he is committed to being around for his son.  To be seen by Dr. Gwenlyn Saran with integrated care today. Right leg: No pain or fever.  Wearing una boot.  To be seen by wound care this afternoon    Review of Systems     Objective:   Physical ExamVS noted, wt unchanged. No resp distress at rest. Lungs Decreased BS both bases.   Cardiac RRR without m or g Abd large panus.  Ascites likely but difficult exam Ext Left leg chronic venous changes.  Seems less edema Right leg una boot - No erythema about boot top.  Seems less edema.        Assessment & Plan:

## 2016-02-28 NOTE — Assessment & Plan Note (Signed)
Did not unwrap una boot since wound care this afternoon.

## 2016-02-29 LAB — COMPLETE METABOLIC PANEL WITH GFR
ALT: 22 U/L (ref 9–46)
AST: 29 U/L (ref 10–35)
Albumin: 3 g/dL — ABNORMAL LOW (ref 3.6–5.1)
Alkaline Phosphatase: 152 U/L — ABNORMAL HIGH (ref 40–115)
BILIRUBIN TOTAL: 1.2 mg/dL (ref 0.2–1.2)
BUN: 28 mg/dL — ABNORMAL HIGH (ref 7–25)
CHLORIDE: 89 mmol/L — AB (ref 98–110)
CO2: 29 mmol/L (ref 20–31)
Calcium: 9.5 mg/dL (ref 8.6–10.3)
Creat: 1.53 mg/dL — ABNORMAL HIGH (ref 0.70–1.33)
GFR, EST NON AFRICAN AMERICAN: 49 mL/min — AB (ref >=60)
GFR, Est African American: 57 mL/min — ABNORMAL LOW (ref >=60)
GLUCOSE: 173 mg/dL — AB (ref 65–99)
Potassium: 3.5 mmol/L (ref 3.5–5.3)
SODIUM: 131 mmol/L — AB (ref 135–146)
Total Protein: 8.1 g/dL (ref 6.1–8.1)

## 2016-02-29 MED ORDER — HYDROCHLOROTHIAZIDE 12.5 MG PO TABS: 12.5000 mg | ORAL_TABLET | ORAL | Status: DC

## 2016-02-29 NOTE — Assessment & Plan Note (Signed)
Called with bump in creat.  Will decrease HCTZ

## 2016-02-29 NOTE — Addendum Note (Signed)
Addended by: Zenia Resides on: 02/29/2016 10:55 AM   Modules accepted: Orders

## 2016-03-06 MED FILL — SPIRONOLACTONE 50 MG TABLET: 50 | 30 days supply | Qty: 60 | Fill #1

## 2016-03-06 MED FILL — CARTIA XT 180 MG CAPSULE SA: 180 | 30 days supply | Qty: 30 | Fill #3

## 2016-03-06 MED FILL — TORSEMIDE 100 MG TABLET: 100 | 30 days supply | Qty: 60 | Fill #2

## 2016-03-13 ENCOUNTER — Ambulatory Visit: Payer: Self-pay | Admitting: Family Medicine

## 2016-03-13 ENCOUNTER — Telehealth: Payer: Self-pay | Admitting: Family Medicine

## 2016-03-13 ENCOUNTER — Ambulatory Visit: Payer: Self-pay

## 2016-03-13 DIAGNOSIS — K7031 Alcoholic cirrhosis of liver with ascites: Secondary | ICD-10-CM

## 2016-03-13 NOTE — Telephone Encounter (Signed)
Will forward to MD to advise. Jazmin Hartsell,CMA  

## 2016-03-13 NOTE — Telephone Encounter (Signed)
Called Doll to check-in.  Left a VM.

## 2016-03-13 NOTE — Telephone Encounter (Signed)
LVM for pt to call back to inform him of below and see about scheduling him for a nurse visit. Katharina Caper, April D, [place]

## 2016-03-13 NOTE — Telephone Encounter (Signed)
OK to come in for nurse/lab visit.   Needs wt and BMP Future order for BMP entered

## 2016-03-13 NOTE — Telephone Encounter (Signed)
PT cancelled his appt today because he had no water and was waiting for a plumber. Before rescheduling, he wants to know if he needs to just come in for blood work to check his kidney function or does dr Andria Frames want to see him? Please advise

## 2016-03-13 NOTE — Telephone Encounter (Signed)
Forwarding to Dr. Andria Frames.

## 2016-03-13 NOTE — Telephone Encounter (Signed)
Checked pt appt desk and he has already scheduled the lab /nurse visit. Katharina Caper, April D, [place]

## 2016-03-13 NOTE — Telephone Encounter (Signed)
Kainen called back.  We discussed his desire for a "not brief" therapy experience like we offer through [facility].  He has insurance issues and also has some specific requirements regarding a therapist (trainees are not likely to be good matches) so he is limited as to where he can go.  Suggested we continue to meet until he finds what he is looking for elsewhere.  Checked out any barriers to continuing to see me and he denied any.  He stated he would call to schedule after he gets his blood drawn and figures out a follow-up for Dr. Andria Frames.

## 2016-03-17 ENCOUNTER — Other Ambulatory Visit: Payer: Self-pay

## 2016-03-19 ENCOUNTER — Telehealth: Payer: Self-pay | Admitting: Psychology

## 2016-03-19 NOTE — Telephone Encounter (Signed)
Patient called to request an appointment.  Scheduled for 11:00 tomorrow.  Told him his appointment is at 10:45 because he says he runs 15 minutes late chronically.  He was able to repeat back appointment time.

## 2016-03-20 ENCOUNTER — Ambulatory Visit (INDEPENDENT_AMBULATORY_CARE_PROVIDER_SITE_OTHER): Payer: Self-pay | Admitting: *Deleted

## 2016-03-20 ENCOUNTER — Other Ambulatory Visit: Payer: Self-pay

## 2016-03-20 ENCOUNTER — Ambulatory Visit (INDEPENDENT_AMBULATORY_CARE_PROVIDER_SITE_OTHER): Payer: Self-pay | Admitting: Psychology

## 2016-03-20 VITALS — BP 136/60 | HR 73 | Wt >= 6400 oz

## 2016-03-20 DIAGNOSIS — Z136 Encounter for screening for cardiovascular disorders: Secondary | ICD-10-CM

## 2016-03-20 DIAGNOSIS — F331 Major depressive disorder, recurrent, moderate: Secondary | ICD-10-CM

## 2016-03-20 DIAGNOSIS — Z0130 Encounter for examination of blood pressure without abnormal findings: Secondary | ICD-10-CM

## 2016-03-20 DIAGNOSIS — K7031 Alcoholic cirrhosis of liver with ascites: Secondary | ICD-10-CM

## 2016-03-20 LAB — BASIC METABOLIC PANEL WITH GFR
BUN: 21 mg/dL (ref 7–25)
CHLORIDE: 95 mmol/L — AB (ref 98–110)
CO2: 31 mmol/L (ref 20–31)
CREATININE: 1.32 mg/dL (ref 0.70–1.33)
Calcium: 8.7 mg/dL (ref 8.6–10.3)
GFR, Est African American: 68 mL/min (ref >=60)
GFR, Est Non African American: 59 mL/min — ABNORMAL LOW (ref >=60)
GLUCOSE: 188 mg/dL — AB (ref 65–99)
POTASSIUM: 3.9 mmol/L (ref 3.5–5.3)
Sodium: 137 mmol/L (ref 135–146)

## 2016-03-20 NOTE — Progress Notes (Signed)
   Patient in nurse clinic for weight check and blood pressure check.  Derl Barrow, RN  Today's Vitals   03/20/16 1210  BP: 136/60  Pulse: 73  Weight: 401 lb 12.8 oz (182.255 kg)  SpO2: 94%

## 2016-03-20 NOTE — Assessment & Plan Note (Signed)
PHQ-9 of 6 today and "somewhat difficult."  Hard to say if this is a decrease because there PHQ-2 readings are 0 (he is not sure why).  He does state he has been in a good place the last few days or so.  Affect is bright today but he did get tearful when talking about some end of life things.  He admits to being tangential.  With our therapy time, we need to be a bit focused.  It felt like a reasonable mix today.  I think he benefits from humanistic side of psychology:  Genuineness, acceptance, and empathy.  I could see him back in one week or three weeks.  He chose the former.  Will forward to Dr. Andria Frames as Joffrey is wondering when he needs to be seen again by him.

## 2016-03-20 NOTE — Progress Notes (Signed)
Reason for follow-up:  Treat depression and provide support around chronic illness.  He had homework:  Attend an [place] meeting and watch a You Tube video.  He did neither.  Issues discussed:  Barriers to completing homework.  He does not know if he is a good fit for AA anymore and a piece of him feels like it is a step backward.  Discussed (again) his plan to attend [employer] and get a Master's in social work.  Explored what made this important to bring up, the likelihood of it coming to fruition, and the alternatives he has.  He has been listening to music and "resolving some things" in his mind.  Did not get a chance to pursue further.  Identified goals:  Continue to consider pros and cons of re-upping at AA.  Also - watch You Tube video and be ready to discuss.

## 2016-03-20 NOTE — Progress Notes (Signed)
Called.  To see me next week.  He will call for appointment.

## 2016-03-20 NOTE — Patient Instructions (Signed)
Please schedule a follow up for:  June 29th at 10:00. You seem ambivalent about AA meetings.  This is to be expected.  Continue to weigh the pros and cons and perhaps you will get to a tipping point.   You don't have a clear idea about what kept you from watching the You Tube on vulnerability.  Consider this some more.

## 2016-03-21 ENCOUNTER — Telehealth: Payer: Self-pay | Admitting: *Deleted

## 2016-03-21 NOTE — Telephone Encounter (Signed)
Patient calling requesting to speak with Dr. Andria Frames regarding recent [place].

## 2016-03-24 NOTE — Telephone Encounter (Signed)
Called and gave results.  No change in meds for now.  I will see in 2 days (has appointment) and make further adjustments then.

## 2016-03-26 ENCOUNTER — Ambulatory Visit (INDEPENDENT_AMBULATORY_CARE_PROVIDER_SITE_OTHER): Payer: Self-pay | Admitting: Family Medicine

## 2016-03-26 ENCOUNTER — Encounter: Payer: Self-pay | Admitting: Family Medicine

## 2016-03-26 VITALS — BP 125/60 | HR 85 | Temp 98.1°F | Ht 70.0 in | Wt >= 6400 oz

## 2016-03-26 DIAGNOSIS — K7031 Alcoholic cirrhosis of liver with ascites: Secondary | ICD-10-CM

## 2016-03-26 DIAGNOSIS — R3911 Hesitancy of micturition: Secondary | ICD-10-CM | POA: Insufficient documentation

## 2016-03-26 DIAGNOSIS — N179 Acute kidney failure, unspecified: Secondary | ICD-10-CM

## 2016-03-26 DIAGNOSIS — F331 Major depressive disorder, recurrent, moderate: Secondary | ICD-10-CM

## 2016-03-26 LAB — POCT URINALYSIS DIPSTICK
Bilirubin, UA: NEGATIVE
Glucose, UA: NEGATIVE
KETONES UA: NEGATIVE
Nitrite, UA: NEGATIVE
PROTEIN UA: NEGATIVE
Spec Grav, UA: 1.015
Urobilinogen, UA: 0.2
pH, UA: 6.5

## 2016-03-26 LAB — POCT UA - MICROSCOPIC ONLY

## 2016-03-26 NOTE — Patient Instructions (Signed)
Once you have Medicare on Aug 1, we need to get a liver ultrasound to check for scarring and you need a colonoscopy. I would like to check you kidneys again in 2-3 weeks. I want to check a urine to see if you have an infection and don't know it.

## 2016-03-27 ENCOUNTER — Ambulatory Visit (INDEPENDENT_AMBULATORY_CARE_PROVIDER_SITE_OTHER): Payer: Self-pay | Admitting: Psychology

## 2016-03-27 DIAGNOSIS — F331 Major depressive disorder, recurrent, moderate: Secondary | ICD-10-CM

## 2016-03-27 NOTE — Assessment & Plan Note (Signed)
States he is now committed to healthy diet.

## 2016-03-27 NOTE — Progress Notes (Signed)
Reason for follow-up:  Continued work on mood issues in the context of serious and chronic medical issues.  Issues discussed:  AA.  He made a pro / con list.  His take away is that he needs to attend - it is the healthiest thing for him but he is not yet ready to pull the trigger.  He thinks a tipping point might be if he gets close to relapsing.  Discussed.  He would like me to bring this up again.  Video / vulnerability.  Reports he watched the video four times and read the transcript.  Took a bunch of notes which he read from his notebook.    Identified goals:  Managing guilt over past decisions.

## 2016-03-27 NOTE — Assessment & Plan Note (Signed)
Continue current diuretic dose.  Recheck labs in two weeks.

## 2016-03-27 NOTE — Assessment & Plan Note (Signed)
Improved.  More upbeat and positive today.

## 2016-03-27 NOTE — Assessment & Plan Note (Signed)
Will get ultrasound to assess ascites and portal hypertension at next visit.

## 2016-03-27 NOTE — Assessment & Plan Note (Signed)
No PHQ-9 today.  Affect within normal limits.  He was tearful on one occasion but otherwise, more intellectual today.  I think the work on vulnerability and shame is very scary for him so he avoided really talking about it by reading from his journal.  Reflected that and will revisit next meeting.  He asked a question at the end of our time that I think is related and potentially useful.  See patient instructions for further plan.

## 2016-03-27 NOTE — Patient Instructions (Signed)
Please follow-up for:  July 13th at 11:00. About this guilt...what are the drawbacks to keeping it around?  Who would you be or what would be different if it weren't there?    We will follow-up on AA stuff next time as well.

## 2016-03-27 NOTE — Progress Notes (Signed)
   Subjective:    Patient ID: Matthew [name], male    DOB: 1956-08-16, 60 y.o.   MRN: [id number]  HPI Ongoing issues with anasarca and liver disease. 1. Anasarca.  Wt is 402 and was 360 earlier this year, even though he is on much higher doses of diuretics.  I have had to back off the diuretics some due to bump in creat.   Unclear if increase in weight is fluid (i.e. Worsening of cirrhosis and ascites) or fat (significant dietary indiscretion.)  Want to get ultrasound.  See finances below. 2. He has had pleural effusion in the past.  Feels less SOB currently. 3. ESLD.  Blood work suggest less inflammation and modest improve hepatocellular function (albumin and INR improved.)  Likely worsening edema.  Worried about worsening cirhosiss and portal hypertension. 4. AKI backed off HCTZ.  Recheck in two weeks. 5. Finances remain problematic.  He will have Medicare (per patient beginning Aug 1.)  Hold off on everything but not emergent testing until then. 6 Depression improved. 7. Funny hx of urinary stream/diuresis being better on antibiiotics.  Has had hx of urosepsis in the past.  Check UA 8. Skin breakdown of right leg from edema.  Followed by wound center.  Redid dressing today because it was wet due to a burst pipe in his home.    Review of Systems     Objective:   Physical ExamLungs clear abd protruberant. Ext 4+ edema.  No skin breakdown.          Assessment & Plan:

## 2016-04-04 MED FILL — TORSEMIDE 100 MG TABLET: 100 | 30 days supply | Qty: 60 | Fill #3

## 2016-04-04 MED FILL — CARTIA XT 180 MG CAPSULE SA: 180 | 30 days supply | Qty: 30 | Fill #4

## 2016-04-04 MED FILL — SPIRONOLACTONE 50 MG TABLET: 50 | 30 days supply | Qty: 60 | Fill #2

## 2016-04-08 IMAGING — US US THORACENTESIS ASP PLEURAL SPACE W/IMG GUIDE
1 series · 1 of 1 positions shown · non-contrast
Comparison: None.

MEDICATIONS:
10 cc 1% lidocaine

COMPLICATIONS:
None immediate

INDICATION: Symptomatic R sided pleural effusion

EXAM:
US THORACENTESIS ASP PLEURAL SPACE W/IMG GUIDE
TECHNIQUE: Informed written consent was obtained from the patient after a
discussion of the risks, benefits and alternatives to treatment. A
timeout was performed prior to the initiation of the procedure.

[Series 1: us thoracentesis asp pleural space w/img guide · 0.29mm/px · 1 of 1 slices shown]
[im 1/1]
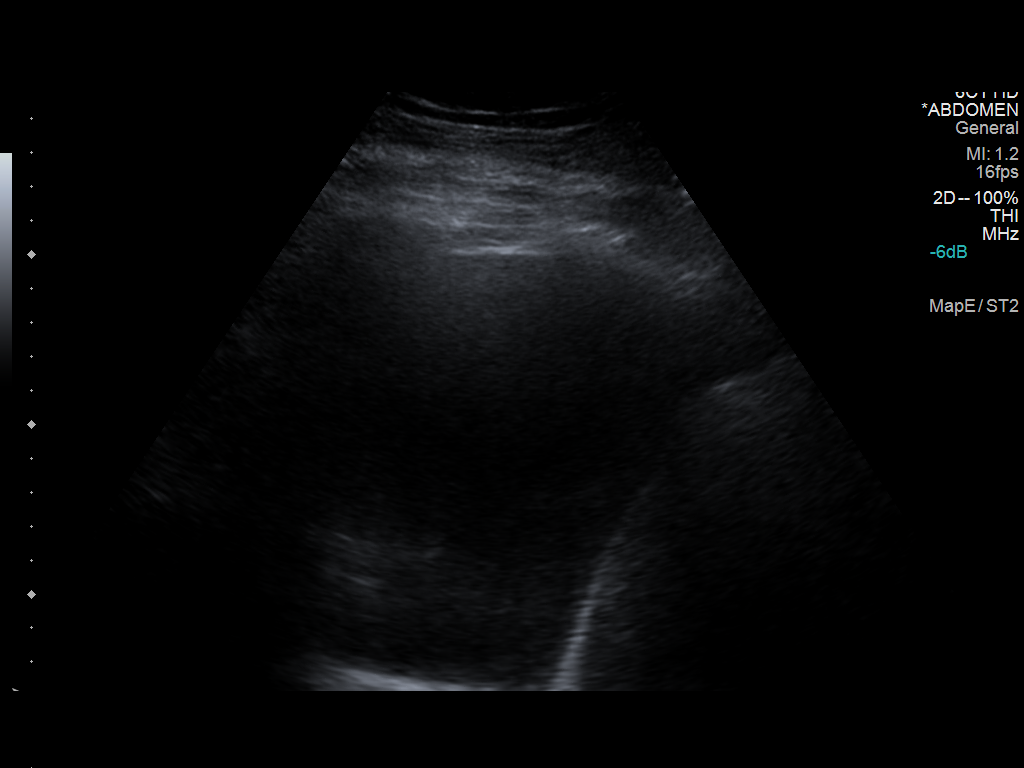

[1 of 1 positions shown; findings below may reference images not displayed]

Initial ultrasound scanning demonstrates a right pleural effusion.
The lower chest was prepped and draped in the usual sterile fashion.
1% lidocaine was used for local anesthesia.

Under direct ultrasound guidance, a 19 gauge, 7-cm, Yueh catheter
was introduced. An ultrasound image was saved for documentation
purposes. The thoracentesis was performed. The catheter was removed
and a dressing was applied. The patient tolerated the procedure well
without immediate post procedural complication. The patient was
escorted to have an upright chest radiograph.
FINDINGS: A total of approximately 1.1 liters of yellow fluid was removed.
Requested samples were sent to the laboratory.
IMPRESSION: Successful ultrasound-guided R sided thoracentesis yielding
liters of pleural fluid.

Read by:  Lixue Ronju

## 2016-04-09 ENCOUNTER — Telehealth: Payer: Self-pay | Admitting: Family Medicine

## 2016-04-09 NOTE — Telephone Encounter (Signed)
Pt is calling because of 2 things. One is he has not picked up his fluid medication and wanted to know if he could take the lasix until he can pick up his medication. The second thing is that he has been using the bathroom a lot at night and he researched this and according to one of the web sites he has a BPH prostate and wanted to know what he should be doing. jw

## 2016-04-09 NOTE — Telephone Encounter (Signed)
Left message OK to substitute lasix for demedex.  Asked to call back on Monday to discuss further if he is having urinary flow problems.

## 2016-04-10 ENCOUNTER — Other Ambulatory Visit: Payer: Self-pay

## 2016-04-10 ENCOUNTER — Telehealth: Payer: Self-pay | Admitting: Family Medicine

## 2016-04-10 ENCOUNTER — Telehealth: Payer: Self-pay | Admitting: Psychology

## 2016-04-10 ENCOUNTER — Ambulatory Visit: Payer: Self-pay | Admitting: Psychology

## 2016-04-10 NOTE — Telephone Encounter (Signed)
Matthew [name] left a VM at 6:11 this morning canceling his behavioral health appointment later that morning.  I called him back around 9:00.  He stated he was out of two of his diuretics, his legs were swollen, and he was having difficulty breathing.  He said he wasn't able to drive and get his medicines from the pharmacy.  He also said he didn't want to "waste any more of [my] time" with therapy and didn't think a traditional therapy environment was worth it either.  He spontaneously said that he would never do anything to hurt himself but did not think he could forgive himself for the things he had done in the past.  I suggested that line of thinking was worth an in person visit before he made a decision.  He agreed but both of [place] thought it was best to attend to his breathing first.  He called back and left another VM at t 1:05 to state that he found a friend that could pick up his medicine.    I called him back to check on him and left a VM.  Will forward note to Tamika and ask her to call to speak to him to see if he may need to come in.

## 2016-04-10 NOTE — Telephone Encounter (Signed)
Contacted Matthew [name] following concerns of significant shortness of breath early this afternoon. Matthew [name] reports significant improvement in shortness of breath following medication. Did not appear short of breath over the phone. Again requested prostate testing; discussed process involved including rectal exam. Encouraged follow up in clinic. To call 911 if breathing worsens again.  Dr. Gerlean Ren 04/10/16, 8:43 PM

## 2016-04-14 ENCOUNTER — Telehealth: Payer: Self-pay | Admitting: Psychology

## 2016-04-14 ENCOUNTER — Telehealth: Payer: Self-pay | Admitting: *Deleted

## 2016-04-14 ENCOUNTER — Ambulatory Visit: Payer: Self-pay

## 2016-04-14 NOTE — Telephone Encounter (Signed)
-----   Message from Geri Seminole, PsyD sent at 04/14/2016 12:20 PM EDT -----    Graylon Good. I noted [facility] doesn't have any openings this Thursday. [name] wanted to see me anyway. He would like a lab appointment (10:30 ish??). Can you let him know what gets decided re: appointments?        Thanks!

## 2016-04-14 NOTE — Telephone Encounter (Signed)
Pt informed of appointment. [place], Salome Spotted, CMA

## 2016-04-14 NOTE — Telephone Encounter (Signed)
Patient called and left a VM requesting an appointment.  Called back and discussed.  Mobility / fatigue is an issue and it would be good to try to consolidate appointments.  He says he is breathing better but has several issues he needs to discuss with a physician (see [place] note).  Offered this coming Thursday at 10:00.  He reports he needs a lab appointment and would like to do it after his appointment with me.  Will forward this to [employer].

## 2016-04-14 NOTE — Telephone Encounter (Signed)
Pt calls and would like a return call from Dr. Andria Frames or Dr. Gerlean Ren about the following:  1. He he has a headache and is not sure what he can take since he has kidney and liver damage  2. Has a rash on his stomach , slightly itchy.  He has not found any ticks that he is aware of.  3. Left side of his neck hurts for 1-2 months now.  He thought it was muscular but now he is not sure. It hurts with movement and when he is sitting still.   Offered pt an appointment but he wanted to hear from Dr. Gwenlyn Saran about an appointment with her before scheduling another appointment. Dawnell Bryant, Salome Spotted, CMA

## 2016-04-16 NOTE — Telephone Encounter (Signed)
Pt is calling back to speak with Dr. Andria Frames, wants to know what he can do about his headaches. ep

## 2016-04-17 ENCOUNTER — Other Ambulatory Visit: Payer: Self-pay

## 2016-04-17 ENCOUNTER — Ambulatory Visit: Payer: Self-pay

## 2016-04-17 NOTE — Telephone Encounter (Signed)
Matthew [name] called yesterday afternoon to cancel his appointment for today.  His VM was long but very difficult to understand.  Called him back this morning.  He has several barriers to getting care including transportation, ambulating, financial, and motivation.  Addressed as many of these as we could.  Scheduled him to see Dr. Andria Frames and me on 8/3.  He wanted to wait until August so his Medicare would pay.  I think he needs to be seen sooner based on how he has been sounding.  I expressed my concern.  He has a call into [facility].

## 2016-04-21 NOTE — Telephone Encounter (Signed)
Pt called and wants to know why no one has called him back. He also wants to speak with Dr. Gerlean Ren about a medication he is on, he said he has been doing some research online and wants to try something and would like for Dr. Gerlean Ren to give him a call about changing it. ep

## 2016-04-22 ENCOUNTER — Telehealth: Payer: Self-pay | Admitting: Psychology

## 2016-04-22 NOTE — Telephone Encounter (Signed)
Patient is wanting to be seen this week.  He is discouraged no one has called him back from his 04/14/16 message.  I will forward the note to Dr. Andria Frames and Gerlean Ren.

## 2016-04-22 NOTE — Telephone Encounter (Signed)
Matthew [name] left a VM requesting an earlier beh health appointment than the one we have scheduled for next week.  I called him back and left a VM.

## 2016-04-22 NOTE — Telephone Encounter (Signed)
Called and apologized for lack of response.  Biggest lingering concern is neck stiffness. He will see me next week.

## 2016-04-24 ENCOUNTER — Other Ambulatory Visit: Payer: Self-pay

## 2016-04-24 ENCOUNTER — Ambulatory Visit (INDEPENDENT_AMBULATORY_CARE_PROVIDER_SITE_OTHER): Payer: Self-pay | Admitting: Psychology

## 2016-04-24 DIAGNOSIS — F331 Major depressive disorder, recurrent, moderate: Secondary | ICD-10-CM

## 2016-04-24 DIAGNOSIS — N179 Acute kidney failure, unspecified: Secondary | ICD-10-CM

## 2016-04-24 LAB — BASIC METABOLIC PANEL
BUN: 15 mg/dL (ref 7–25)
CHLORIDE: 99 mmol/L (ref 98–110)
CO2: 29 mmol/L (ref 20–31)
CREATININE: 1.1 mg/dL (ref 0.70–1.33)
Calcium: 8.6 mg/dL (ref 8.6–10.3)
Glucose, Bld: 129 mg/dL — ABNORMAL HIGH (ref 65–99)
POTASSIUM: 3.9 mmol/L (ref 3.5–5.3)
Sodium: 139 mmol/L (ref 135–146)

## 2016-04-24 NOTE — Progress Notes (Signed)
Reason for follow-up:  Continued support / therapy around managing serious illness.  Issues discussed:  His son has said he wants to come back from [place] (where he has been living with his mother) so that he can help take care of [name].    His heatlhcare:  What [name] needs from his healthcare providers and what we need from him.  Part of the discussion involved attending appointments frequently enough and allowing himself to be honest with his health care providers about how how bad he is feeling or how scared he is about his health.  Therapy:  He continues to be upset about the amount of time for IC appointments although we always go longer than 30 minutes.  He thinks a "free flowing" format would be good for him.  Discussed re-upping with his previous therapy place or [facility].  Whether he needed to be seen today.  He does not think he does and does not want to be seen.     Identified goals:  Getting clearer on the advantages and disadvantages of having his son move in with him and moving on from there.  He continues to think about AA but right now his breathing is so labored it would be difficult to get into a meeting.

## 2016-04-24 NOTE — Patient Instructions (Addendum)
I plan to see you back August 3rd at 10:00. We talked a lot about Matthew [name] and your future.  Please consider making a list of advantages and disadvantages of him coming to live with you.  Then..if it seems like it would be a good idea, I would encourage to walk Matthew [name] through the same process to make sure that you are on the same page and are making the best decision for both of you.  I might also consider asking Matthew [name], what else he plans to do to advance his life, if he chose to come live with you.  I think him mapping out a solid plan, would help you feel better about the situation.

## 2016-04-24 NOTE — Assessment & Plan Note (Signed)
Affect is within normal limits.  His breathing was very labored to me when he sat down in the room but got appreciably better.  He was able to talk in full sentences.  Discussed concerns Matthew [name] (who drew his blood prior to our meeting) had with Dr. Nori Riis.  He was offered an appointment and declined.  I think he benefits from being called on faulty assumptions or contradictions.  He seems to test quite a bit.  This needs to be balanced with a fair amount of warmth.    See patient instructions for further plan.

## 2016-05-01 ENCOUNTER — Telehealth: Payer: Self-pay | Admitting: Family Medicine

## 2016-05-01 ENCOUNTER — Ambulatory Visit: Payer: Self-pay

## 2016-05-01 ENCOUNTER — Ambulatory Visit: Payer: Self-pay | Admitting: Family Medicine

## 2016-05-01 DIAGNOSIS — G5793 Unspecified mononeuropathy of bilateral lower limbs: Secondary | ICD-10-CM

## 2016-05-01 NOTE — Telephone Encounter (Signed)
Pt missed his appointment today because his feet were hurting so badly. Pt stated they hurt so bad he can't even walk. Pt would like something for the pain.  Pt stated he is not feeling and wants to talk to the doctor about that because he was unable to make it to his appointment today. Pt states he is unable to come in until his feet are feeling better. Please advise. ep

## 2016-05-02 DIAGNOSIS — G5793 Unspecified mononeuropathy of bilateral lower limbs: Secondary | ICD-10-CM | POA: Insufficient documentation

## 2016-05-02 MED ORDER — GABAPENTIN 100 MG PO CAPS: 100.0000 mg | ORAL_CAPSULE | Freq: Three times a day (TID) | ORAL | 3 refills | Status: DC

## 2016-05-02 NOTE — Telephone Encounter (Signed)
Routing to Dr. Andria Frames because pt was on his schedule. Katharina Caper, Londa Mackowski D, [place]

## 2016-05-02 NOTE — Assessment & Plan Note (Signed)
Called.  Will treat as neuropathy.

## 2016-05-02 NOTE — Telephone Encounter (Signed)
No trauma.  Will treat as neuropathy.  Asked to schedule an appointment for next week.

## 2016-05-07 ENCOUNTER — Encounter: Payer: Self-pay | Admitting: Family Medicine

## 2016-05-07 ENCOUNTER — Ambulatory Visit (INDEPENDENT_AMBULATORY_CARE_PROVIDER_SITE_OTHER): Payer: Medicare Other | Admitting: Family Medicine

## 2016-05-07 ENCOUNTER — Other Ambulatory Visit: Payer: Self-pay | Admitting: Family Medicine

## 2016-05-07 ENCOUNTER — Ambulatory Visit (INDEPENDENT_AMBULATORY_CARE_PROVIDER_SITE_OTHER): Payer: Medicare Other | Admitting: Psychology

## 2016-05-07 DIAGNOSIS — K7031 Alcoholic cirrhosis of liver with ascites: Secondary | ICD-10-CM

## 2016-05-07 DIAGNOSIS — R600 Localized edema: Secondary | ICD-10-CM

## 2016-05-07 DIAGNOSIS — G629 Polyneuropathy, unspecified: Secondary | ICD-10-CM

## 2016-05-07 DIAGNOSIS — G5793 Unspecified mononeuropathy of bilateral lower limbs: Secondary | ICD-10-CM

## 2016-05-07 DIAGNOSIS — F331 Major depressive disorder, recurrent, moderate: Secondary | ICD-10-CM

## 2016-05-07 MED ORDER — GABAPENTIN 300 MG PO CAPS: 300.0000 mg | ORAL_CAPSULE | Freq: Three times a day (TID) | ORAL | 3 refills | Status: DC

## 2016-05-07 MED ORDER — SPIRONOLACTONE 100 MG PO TABS: 200.0000 mg | ORAL_TABLET | Freq: Every day | ORAL | 12 refills | Status: DC

## 2016-05-07 MED ORDER — TORSEMIDE 100 MG PO TABS: 100.0000 mg | ORAL_TABLET | Freq: Two times a day (BID) | ORAL | 3 refills | Status: DC

## 2016-05-07 MED FILL — spIRONOLACTONE 100 MG TAB: 100 | 30 days supply | Qty: 60 | Fill #0

## 2016-05-07 MED FILL — TORSEMIDE 100 MG TABLET: 100 | 30 days supply | Qty: 60 | Fill #0

## 2016-05-07 MED FILL — GABAPENTIN 300 MG CAPSULE: 300 | 30 days supply | Qty: 90 | Fill #0

## 2016-05-07 MED FILL — CARTIA XT 180 MG CAPSULE SA: 180 | 30 days supply | Qty: 30 | Fill #5

## 2016-05-07 NOTE — Progress Notes (Signed)
Reason for follow-up:  Mr. Starck returns for continued management of mood as it relates to significant chronic health issues.    Issues discussed:  Initial focus was following up on last meeting issue of his son coming to stay with him.  He identified that as things are currently, [name] coming to live with him would be potentially very stressful and therefore have a negative effect on Mr. Bellido health.  Identified goals:  Mr. Luers wants resolution of some issues for [name] and better health for himself (breathing and being able to walk) before he allows [name] to come stay with him.

## 2016-05-07 NOTE — Assessment & Plan Note (Addendum)
Increase spironolactone  Wt gain is likely a combo of true wt gain and fluid retention.

## 2016-05-07 NOTE — Patient Instructions (Signed)
I sent in a prescription for the higher dose of the spironolactone and gabapentin. See me in two weeks.

## 2016-05-07 NOTE — Patient Instructions (Signed)
Please call me to schedule a follow-up. With regards to Matthew [name], it sounds like your needs are two fold:  1.  You need his life to be a bit tidier before he comes down here. 2.  You need your health to be a bit better before he comes down here.  These are your limits to set and maintain.  If you are sure about them, I would communicate them confidently.

## 2016-05-07 NOTE — Assessment & Plan Note (Signed)
Matthew [name] continues to feel constrained by the time limits and structure of [facility].  Matthew [name] would like a more "free flowing" format because Matthew [name] has so much inside of him that "needs to come out."  Matthew [name] also says, that free flowing types of therapy aren't effective.  This and several other contradictory statements likely reflect his ambivalence about therapy and other things, including the big issue of whether his son should come and live with him.  Matthew [name] has been toying with the idea of traditional therapy since Matthew [name] started to meet with me.  This one foot in / one foot out makes it difficult for him to make any progress.  Matthew [name] does not yet appear ready to commit to a plan of action.  Will continue to work with the ambivalence.

## 2016-05-08 NOTE — Assessment & Plan Note (Signed)
Gabapentin has helped..  Continue

## 2016-05-08 NOTE — Progress Notes (Signed)
   Subjective:    Patient ID: Matthew [name], male    DOB: [birth year], 60 y.o.   MRN: [id number]  HPI   Recheck.  Feels good.  Spirits OK except thinks he had a misunderstanding with Dr. Gwenlyn Saran.  Admits to dietary indiscretion.  Denies dyspnea.  Last creat OK.  States compliant with meds. Legs - denies skin breakdown.  Denies fever Gabapentin has helped burning of feet.  Review of Systems     Objective:   Physical Exam Lungs clear. Weight up!!! Abd large pannus, likely ascites Legs 3+ edema and chronic venous stasis changes, no skin breakdown.        Assessment & Plan:

## 2016-05-08 NOTE — Assessment & Plan Note (Signed)
Likely worse with admitted indiscretion.  Really impossible to tell.  Regardless, focus on dietary compliance.

## 2016-05-08 NOTE — Assessment & Plan Note (Signed)
Pleased no skin breakdown.

## 2016-05-09 ENCOUNTER — Telehealth: Payer: Self-pay | Admitting: Family Medicine

## 2016-05-09 DIAGNOSIS — K7031 Alcoholic cirrhosis of liver with ascites: Secondary | ICD-10-CM

## 2016-05-09 NOTE — Telephone Encounter (Signed)
Pt is calling because on his visit with Dr. Andria Frames 05/07/16 he thought that Dr. Andria Frames told him to start taking his Hydrodiuril every day. He has been taking this every 3 days. On his AVS is stated to take as every 3 days. Please call patient to discuss. jw

## 2016-05-12 ENCOUNTER — Encounter: Payer: Self-pay | Admitting: Psychology

## 2016-05-12 MED ORDER — HYDROCHLOROTHIAZIDE 12.5 MG PO TABS: 12.5000 mg | ORAL_TABLET | Freq: Every day | ORAL | 3 refills | Status: DC

## 2016-05-12 NOTE — Telephone Encounter (Signed)
Correct, daily HCTZ.  Changed in med list.  Does not need new Rx.  Discussed with patient.

## 2016-05-14 ENCOUNTER — Ambulatory Visit: Payer: Self-pay

## 2016-05-14 ENCOUNTER — Encounter: Payer: Self-pay | Admitting: Licensed Clinical Social Worker

## 2016-05-14 NOTE — Progress Notes (Signed)
Patient ID: Matthew [name], male   DOB: [birth year], 60 y.o.   MRN: [id number]  [facility] consult from [place], patient is in the lobby stating something is wrong with his brain.  Patient does not have an appointment today and is followed Dr. Gwenlyn Saran in the [facility].  CSW met with patient to assess for safety.  Patient presented angry and disappointed stating something must be wrong with my head because I never get appointment dates mixed up.  Patient has an appointment with the [facility] and his PCP next Wednesday however he got the days mixed up.    Patient is concerned that his confusion is related to possible elevated ammonia levels. However patient states this is the only situation he has gotten confused or mixed up.  States he had the appointment in his phone but did not look at it. Patient reports no new stressors or psychosocial changes since his last appointment with Dr. Gwenlyn Saran.   CSW provided emotional support and allowed patient to process is anger and feelings of disappointment.  Patient also has medication he needed to pick up from the outpatient pharmacy and wanted assistance as he has extreme difficulty getting in and out of the car. Call to pharmacy to verify the medication was ready for pick up.  They will bring mediation to the car when patient calls.    Plan: Patient will call pharmacy to bring his medication to the car once in the parking lot, he will keep his appointment new week with his PCP and [facility], and will check his phone for future scheduled appointments.  Casimer Lanius, LCSW Licensed Clinical Social Worker [facility]   [phone] 3:07 PM

## 2016-05-17 ENCOUNTER — Encounter (HOSPITAL_COMMUNITY): Payer: Self-pay | Admitting: Nurse Practitioner

## 2016-05-17 ENCOUNTER — Emergency Department (HOSPITAL_COMMUNITY): Payer: Medicare Other

## 2016-05-17 ENCOUNTER — Other Ambulatory Visit: Payer: Self-pay

## 2016-05-17 ENCOUNTER — Inpatient Hospital Stay (HOSPITAL_COMMUNITY)
Admission: EM | Admit: 2016-05-17 | Discharge: 2016-05-27 | DRG: 872 | Disposition: A | Discharge status: Home Health | Payer: Medicare Other | Attending: Family Medicine | Admitting: Family Medicine

## 2016-05-17 DIAGNOSIS — J189 Pneumonia, unspecified organism: Secondary | ICD-10-CM | POA: Diagnosis present

## 2016-05-17 DIAGNOSIS — R600 Localized edema: Secondary | ICD-10-CM | POA: Diagnosis present

## 2016-05-17 DIAGNOSIS — A419 Sepsis, unspecified organism: Secondary | ICD-10-CM

## 2016-05-17 DIAGNOSIS — F1011 Alcohol abuse, in remission: Secondary | ICD-10-CM | POA: Diagnosis present

## 2016-05-17 DIAGNOSIS — R601 Generalized edema: Secondary | ICD-10-CM | POA: Diagnosis present

## 2016-05-17 DIAGNOSIS — N179 Acute kidney failure, unspecified: Secondary | ICD-10-CM | POA: Diagnosis present

## 2016-05-17 DIAGNOSIS — K7031 Alcoholic cirrhosis of liver with ascites: Secondary | ICD-10-CM | POA: Diagnosis present

## 2016-05-17 DIAGNOSIS — I482 Chronic atrial fibrillation: Secondary | ICD-10-CM | POA: Diagnosis present

## 2016-05-17 DIAGNOSIS — L03115 Cellulitis of right lower limb: Secondary | ICD-10-CM | POA: Diagnosis present

## 2016-05-17 DIAGNOSIS — Z87891 Personal history of nicotine dependence: Secondary | ICD-10-CM

## 2016-05-17 DIAGNOSIS — E65 Localized adiposity: Secondary | ICD-10-CM | POA: Diagnosis present

## 2016-05-17 DIAGNOSIS — E1142 Type 2 diabetes mellitus with diabetic polyneuropathy: Secondary | ICD-10-CM | POA: Diagnosis present

## 2016-05-17 DIAGNOSIS — E669 Obesity, unspecified: Secondary | ICD-10-CM | POA: Diagnosis present

## 2016-05-17 DIAGNOSIS — I10 Essential (primary) hypertension: Secondary | ICD-10-CM | POA: Diagnosis present

## 2016-05-17 DIAGNOSIS — J90 Pleural effusion, not elsewhere classified: Secondary | ICD-10-CM | POA: Diagnosis present

## 2016-05-17 DIAGNOSIS — Z9889 Other specified postprocedural states: Secondary | ICD-10-CM

## 2016-05-17 DIAGNOSIS — E11621 Type 2 diabetes mellitus with foot ulcer: Secondary | ICD-10-CM | POA: Diagnosis present

## 2016-05-17 DIAGNOSIS — R7881 Bacteremia: Secondary | ICD-10-CM | POA: Diagnosis present

## 2016-05-17 DIAGNOSIS — R0602 Shortness of breath: Secondary | ICD-10-CM

## 2016-05-17 DIAGNOSIS — Z6841 Body Mass Index (BMI) 40.0 and over, adult: Secondary | ICD-10-CM

## 2016-05-17 DIAGNOSIS — L97519 Non-pressure chronic ulcer of other part of right foot with unspecified severity: Secondary | ICD-10-CM | POA: Diagnosis present

## 2016-05-17 DIAGNOSIS — Z888 Allergy status to other drugs, medicaments and biological substances status: Secondary | ICD-10-CM

## 2016-05-17 DIAGNOSIS — A4902 Methicillin resistant Staphylococcus aureus infection, unspecified site: Secondary | ICD-10-CM

## 2016-05-17 DIAGNOSIS — K7030 Alcoholic cirrhosis of liver without ascites: Secondary | ICD-10-CM | POA: Diagnosis present

## 2016-05-17 DIAGNOSIS — G4733 Obstructive sleep apnea (adult) (pediatric): Secondary | ICD-10-CM | POA: Diagnosis present

## 2016-05-17 DIAGNOSIS — K219 Gastro-esophageal reflux disease without esophagitis: Secondary | ICD-10-CM | POA: Diagnosis present

## 2016-05-17 DIAGNOSIS — R0902 Hypoxemia: Secondary | ICD-10-CM | POA: Diagnosis not present

## 2016-05-17 DIAGNOSIS — G5793 Unspecified mononeuropathy of bilateral lower limbs: Secondary | ICD-10-CM

## 2016-05-17 DIAGNOSIS — F331 Major depressive disorder, recurrent, moderate: Secondary | ICD-10-CM | POA: Diagnosis present

## 2016-05-17 DIAGNOSIS — Z79899 Other long term (current) drug therapy: Secondary | ICD-10-CM

## 2016-05-17 DIAGNOSIS — L304 Erythema intertrigo: Secondary | ICD-10-CM | POA: Diagnosis present

## 2016-05-17 DIAGNOSIS — A4102 Sepsis due to Methicillin resistant Staphylococcus aureus: Secondary | ICD-10-CM | POA: Diagnosis not present

## 2016-05-17 DIAGNOSIS — E8770 Fluid overload, unspecified: Secondary | ICD-10-CM | POA: Diagnosis present

## 2016-05-17 DIAGNOSIS — Z881 Allergy status to other antibiotic agents status: Secondary | ICD-10-CM

## 2016-05-17 DIAGNOSIS — B9562 Methicillin resistant Staphylococcus aureus infection as the cause of diseases classified elsewhere: Secondary | ICD-10-CM | POA: Diagnosis present

## 2016-05-17 DIAGNOSIS — K7460 Unspecified cirrhosis of liver: Secondary | ICD-10-CM

## 2016-05-17 LAB — URINALYSIS, ROUTINE W REFLEX MICROSCOPIC
Bilirubin Urine: NEGATIVE
GLUCOSE, UA: NEGATIVE mg/dL
KETONES UR: NEGATIVE mg/dL
Nitrite: NEGATIVE
PROTEIN: NEGATIVE mg/dL
Specific Gravity, Urine: 1.009 (ref 1.005–1.030)
pH: 7 (ref 5.0–8.0)

## 2016-05-17 LAB — CBC WITH DIFFERENTIAL/PLATELET
Basophils Absolute: 0.1 10*3/uL (ref 0.0–0.1)
Basophils Relative: 0 %
EOS PCT: 0 %
Eosinophils Absolute: 0.1 10*3/uL (ref 0.0–0.7)
HCT: 50.3 % (ref 39.0–52.0)
Hemoglobin: 17.6 g/dL — ABNORMAL HIGH (ref 13.0–17.0)
LYMPHS ABS: 1.2 10*3/uL (ref 0.7–4.0)
LYMPHS PCT: 5 %
MCH: 32.7 pg (ref 26.0–34.0)
MCHC: 35 g/dL (ref 30.0–36.0)
MCV: 93.3 fL (ref 78.0–100.0)
MONO ABS: 2.6 10*3/uL — AB (ref 0.1–1.0)
Monocytes Relative: 11 %
Neutro Abs: 19.9 10*3/uL — ABNORMAL HIGH (ref 1.7–7.7)
Neutrophils Relative %: 84 %
PLATELETS: 465 10*3/uL — AB (ref 150–400)
RBC: 5.39 MIL/uL (ref 4.22–5.81)
RDW: 15.8 % — AB (ref 11.5–15.5)
WBC: 23.9 10*3/uL — ABNORMAL HIGH (ref 4.0–10.5)

## 2016-05-17 LAB — URINE MICROSCOPIC-ADD ON
BACTERIA UA: NONE SEEN
Squamous Epithelial / LPF: NONE SEEN

## 2016-05-17 LAB — COMPREHENSIVE METABOLIC PANEL
ALBUMIN: 2.9 g/dL — AB (ref 3.5–5.0)
ALT: 24 U/L (ref 17–63)
AST: 40 U/L (ref 15–41)
Alkaline Phosphatase: 184 U/L — ABNORMAL HIGH (ref 38–126)
Anion gap: 11 (ref 5–15)
BILIRUBIN TOTAL: 1.6 mg/dL — AB (ref 0.3–1.2)
BUN: 18 mg/dL (ref 6–20)
CHLORIDE: 91 mmol/L — AB (ref 101–111)
CO2: 33 mmol/L — ABNORMAL HIGH (ref 22–32)
Calcium: 9.6 mg/dL (ref 8.9–10.3)
Creatinine, Ser: 1.08 mg/dL (ref 0.61–1.24)
GFR calc Af Amer: >60 mL/min (ref >60)
GFR calc non Af Amer: >60 mL/min (ref >60)
GLUCOSE: 172 mg/dL — AB (ref 65–99)
POTASSIUM: 3.6 mmol/L (ref 3.5–5.1)
Sodium: 135 mmol/L (ref 135–145)
TOTAL PROTEIN: 8.7 g/dL — AB (ref 6.5–8.1)

## 2016-05-17 LAB — BRAIN NATRIURETIC PEPTIDE: B Natriuretic Peptide: 601.5 pg/mL — ABNORMAL HIGH (ref 0.0–100.0)

## 2016-05-17 LAB — I-STAT CG4 LACTIC ACID, ED: LACTIC ACID, VENOUS: 3.55 mmol/L — AB (ref 0.5–1.9)

## 2016-05-17 LAB — AMMONIA: Ammonia: 101 umol/L — ABNORMAL HIGH (ref 9–35)

## 2016-05-17 MED ORDER — SODIUM CHLORIDE 0.9 % IV BOLUS (SEPSIS)
1000.0000 mL | Freq: Once | INTRAVENOUS | Status: AC
  Administered 2016-05-17: 1000 mL via INTRAVENOUS

## 2016-05-17 MED ORDER — ONDANSETRON HCL 4 MG/2ML IJ SOLN
4.0000 mg | Freq: Once | INTRAMUSCULAR | Status: AC
  Administered 2016-05-17: 4 mg via INTRAVENOUS
  Filled 2016-05-17: qty 2

## 2016-05-17 MED ORDER — VANCOMYCIN HCL 10 G IV SOLR
2500.0000 mg | Freq: Once | INTRAVENOUS | Status: AC
  Administered 2016-05-17: 2500 mg via INTRAVENOUS
  Filled 2016-05-17: qty 2000

## 2016-05-17 MED ORDER — PIPERACILLIN-TAZOBACTAM 3.375 G IVPB
3.3750 g | Freq: Once | INTRAVENOUS | Status: AC
Start: 2016-05-17 — End: 2016-05-17
  Administered 2016-05-17: 3.375 g via INTRAVENOUS
  Filled 2016-05-17: qty 50

## 2016-05-17 MED ORDER — VANCOMYCIN HCL 10 G IV SOLR
1250.0000 mg | Freq: Two times a day (BID) | INTRAVENOUS | Status: DC
  Filled 2016-05-17: qty 1250

## 2016-05-17 NOTE — H&P (Signed)
[facility] Admission History and Physical Service Pager: [phone]  Patient name: Matthew [name] Medical record number: [id number] Date of birth: [birth year] Age: 60 y.o. Gender: male  Primary Care Provider: Junie Panning, DO Consultants: none Code Status: FULL  Chief Complaint: feeling unwell, chills  Assessment and Plan: ASHTIN [name] is a 60 y.o. male presenting with sudden onset, chills, and "something is not right, I do not feel good." PMH is significant for DMII, morbid obesity, OSA, alcohol abuse, essential HTN, alcoholic cirrhosis of liver with ascites, depression, afib, GERD.   Sepsis of unknown source: Presented with feeling unwell and chills at home. Has been afebrile. In [facility] ED, tachypneic to 30, BP 111/73, HR 114.  Lactate 3.55, WBC 23.9. Patient meeting sepsis criteria w/ suspected source of infection RLE cellulitis.  Lactate decreased to 2.70 with 1L bolus. Vanc/zosyn given. UA with large leuks, no nitrites, no bacteria. CXR with vascular congestion, Small right pleural effusion noted. Borderline cardiomegaly. Increased interstitial markings may reflect mild interstitial edema. Possible alternative sources include bilateral lateral midfoot eschars, urine (less likely), SBP, or pneumonia.  -admit to telemetry, [place] attending -will treat per sepsis protocol -monitor urine and blood cultures -additional 1L bolus.  Somewhat counterintuitive given elevated BNP.  Patient chronically fluid overloaded.  Not having any respiratory distress/ O2 requirement.  I do think he needs diuresis.  However, in the setting of acute illness, will fluid resuscitate then work on diuresis. -continue vanc and zosyn -consider IR Diagnostic paracentesis -PTT -CBC with diff -CMP  -lactic acid -procalcitonin -abdominal US   Anasarca and alcoholic cirrhosis: Wt [id number] today. Most recent weight in clinic 409. Recent weight increase in clinic led to increasing spiro and  HCTZ. Also on Demadex outpatient.  BNP >600 (baseline ~200).  Last echo 01/2015 EF 0000000, normal systolic function, wall motion poorly visualized. -IV lasix 60mg  q6h (roughly his home dose of Demadex and Spiro converted to Lasix IV).  First dose ordered for 0830.  Will need to reassess BP, clinical picture and determine proceeding with repeat Lasix doses.  May also need to increase dose for better diuresis. -Strict I/Os -Daily weights -holding home HCTZ, spiro, and torsemide -ECHO to consider possible fluid sources: CHF vs ESLD -abdominal US   DMII: Diet controlled. Cover with SSI. Most recent A1C 6.0 in April. -repeat HgbA1c -continue home neurontin -sensitive sliding scale insulin -monitor CBGs  HTN: Home meds are HCTZ 12.5mg  daily.  -holding HCTZ in the setting of IV lasix use  Afib: Rate controlled on dilt, will continue.  -continue dilt w/ holding parameters SBP<110, DBP<60  OSA: Uses CPAP at home.  -continue CPAP qhs  FEN/GI: heart healthy/carb mod diet, SSI Prophylaxis: lovenox  Disposition: telemetry monitoring  History of Present Illness:  Matthew [name] is a 60 y.o. male presenting with sudden onset, chills, and "something is not right, I do not feel good." He states he began to feel generally unwell on 8/19.  He notes increased stress.  He is worried that he is having sepsis again.  He notes that he has not been doing well since his mother died.  He reports he drank excessively for a while but now has not drank in over a year.  He notes that his son is stressing him out.  Occ marijuana.  No ETOH.  No smoking.  He reports chills, labored breathing (chronic).  He reports he has been eating more than normal.  No dysuria, diarrhea, CP.  Is not able  to see feet.  He reports calloused feet that are monitored by wound care center.  Has been using increased diuretics as directed at last PCP appt.    In [facility] ED, pt received 1 dose of vanc and zosyn. Lactate 3.55, repeat 2.70, BNP  601.5, BC drawn x2, UA large leuks, no nitrites, no organisms seen. WBC 23.9. Ammonia elevated to 101. CXR read as vascular congestion, cannot rule out pneumonia. 1L bolus given.   Review Of Systems: Per HPI with the following additions: none. Otherwise the remainder of the systems were negative.  Patient Active Problem List   Diagnosis Date Noted  . Neuropathic pain of both feet ([place]) 05/02/2016  . Urinary hesitancy 03/26/2016  . Recurrent pleural effusion on right 12/13/2015  . GERD (gastroesophageal reflux disease) 09/27/2015  . Rotator cuff (capsule) sprain 09/15/2015  . Leg edema 08/05/2015  . Diabetic ulcer of right foot associated with type 2 diabetes mellitus ([place])   . Paroxysmal atrial fibrillation (H[facility])   . AKI (acute kidney injury) ([place])   . Depression, major, recurrent, moderate ([place]) 03/21/2015  . Alcoholic cirrhosis of liver with ascites ([place])   . Essential hypertension   . Pannus, abdominal 02/23/2014  . 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 Poor social situation 11/11/2013  . Alcohol abuse, in remission 11/03/2013  . DM type 2 (diabetes mellitus, type 2) ([place]) 03/25/2012  . Morbid obesity ([place]) 03/25/2012  . Sleep apnea 03/25/2012    Past Medical History: Past Medical History:  Diagnosis Date  . Atrial fibrillation, chronic ([place])   . CHF (congestive heart failure) ([place])   . GERD (gastroesophageal reflux disease)   . Hypertension   . Septic shock ([place])   . Shortness of breath dyspnea     Past Surgical History: Past Surgical History:  Procedure Laterality Date  . HERNIA REPAIR    . KNEE ARTHROSCOPY    . SPLENECTOMY    . TONSILECTOMY, ADENOIDECTOMY, BILATERAL MYRINGOTOMY AND TUBES      Social History: Social History  Substance Use Topics  . Smoking status: Former Smoker    Types: Cigarettes    Quit date: 11/01/1974  . Smokeless tobacco: Never Used     Comment: smoked in high school for about a year  . Alcohol use Yes   Additional social  history: lives alone, has a 44 yo son  Please also refer to relevant sections of EMR.  Family History: History reviewed. No pertinent family history.  Allergies and Medications: Allergies  Allergen Reactions  . Potassium-Containing Compounds Other (See Comments)    Reaction:  Stomach pain   . Doxycycline Other (See Comments)    Reaction:  Right side pain   . Augmentin [Amoxicillin-Pot Clavulanate] Nausea Only and Other (See Comments)    Has patient had a PCN reaction causing immediate rash, facial/tongue/throat swelling, SOB or lightheadedness with hypotension: No Has patient had a PCN reaction causing severe rash involving mucus membranes or skin necrosis: No Has patient had a PCN reaction that required hospitalization No Has patient had a PCN reaction occurring within the last 10 years: Yes If all of the above answers are "NO", then may proceed with Cephalosporin use.  [name] Neomycin Rash   No current facility-administered medications on file prior to encounter.    Current Outpatient Prescriptions on File Prior to Encounter  Medication Sig Dispense Refill  . hydrochlorothiazide (HYDRODIURIL) 12.5 MG tablet Take 1 tablet (12.5 mg total) by mouth daily. 90 tablet 3  . spironolactone (ALDACTONE) 100 MG tablet Take  2 tablets (200 mg total) by mouth daily. 60 tablet 12  . torsemide (DEMADEX) 100 MG tablet Take 1 tablet (100 mg total) by mouth 2 (two) times daily. 60 tablet 3    Objective: BP 103/72 (BP Location: Right Arm)   Pulse 114   Temp 99.8 F (37.7 C)   Resp 24   Ht 6' (1.829 m)   Wt (!) 181.4 kg (400 lb)   SpO2 90%   BMI 54.25 kg/m  Exam: General: NAD, morbidly obese male, pleasant. Eyes: EOMI, PERRLA. ENTM: mucosa pink and moist Cardiovascular: difficult to auscultate 2/2 body habitus but sounds like an irregularly irregular rhythm, no m/r/g Respiratory: CTAB, good air movement  Abdomen: large pannus w/ marked thickening of skin on the inferior aspect, SNT, umbilical  hernia not reducible and nontender, no CVA tenderness Extremities: 2+ BLE, skin darkening consistent with chronic venous stasis, R leg erythematous and warm up to lateral knee. Nonpurlent. Bilateral lateral midfoot eschars without erythema or warmth. Skin: as aforementioned, otherwise unremarkable Neuro: grossly intact, follows commands Psych: appropriate mood and affect  Labs and Imaging: CBC BMET   Recent Labs Lab 05/17/16 1840  WBC 23.9*  HGB 17.6*  HCT 50.3  PLT 465*    Recent Labs Lab 05/17/16 1840  NA 135  K 3.6  CL 91*  CO2 33*  BUN 18  CREATININE 1.08  GLUCOSE 172*  CALCIUM 9.6     Lactic Acid, Venous    Component Value Date/Time   LATICACIDVEN 2.70 (HH) 05/18/2016 0120    Dg Chest Port 1 View  Result Date: 05/17/2016 CLINICAL DATA:  Acute onset of severe chills and fever. Initial encounter. EXAM: PORTABLE CHEST 1 VIEW COMPARISON:  Chest radiograph performed 12/13/2015 FINDINGS: The lungs are well-aerated. A small right pleural effusion is noted. Vascular congestion is noted. Increased interstitial markings may reflect mild interstitial edema. Pneumonia could have a similar appearance, given the patient's symptoms. No pneumothorax is seen. The cardiomediastinal silhouette is borderline enlarged. No acute osseous abnormalities are seen. IMPRESSION: Small right pleural effusion noted. Vascular congestion and borderline cardiomegaly. Increased interstitial markings may reflect mild interstitial edema. Pneumonia could have a similar appearance, given the patient's symptoms. Electronically Signed   By: Garald Balding M.D.   On: 05/17/2016 19:52    Sela Hilding, MD 05/17/2016, 10:46 PM PGY-1, [place] [facility] pager: [phone], text pages welcome  I have separately seen and examined the patient. I have discussed the findings and exam with Dr Lindell Noe and agree with the above note.  My changes/additions are outlined in BLUE.   Gaylan Fauver M.  Lajuana Ripple, DO PGY-3, [facility]

## 2016-05-17 NOTE — ED Provider Notes (Signed)
[name] DEPT Provider Note   CSN: [id number] Arrival date & time: 05/17/16  1731     History   Chief Complaint Chief Complaint  Patient presents with  . Fever  . Chills    HPI Matthew [name] is a 60 y.o. male.  Patient with a history of CHF, Atrial fibrillation, Sepsis, Liver Cirrhosis, Morbid Obesity presents today with vague complaints.  He states that he is just not feeling well today.  He has been feeling febrile, but has not taken his temperature.  Temp upon arrival is 99.8 F orally.  He states that he has had a productive cough and SOB over the past couple of days.  He denies any other symptoms at this time.  No headache, chest pain, nausea, vomiting, or any other symptoms.  He does have some chronic swelling of his scrotum, which he reports is unchanged.        Past Medical History:  Diagnosis Date  . Atrial fibrillation, chronic ([place])   . CHF (congestive heart failure) ([place])   . GERD (gastroesophageal reflux disease)   . Hypertension   . Septic shock ([place])   . Shortness of breath dyspnea     Patient Active Problem List   Diagnosis Date Noted  . Neuropathic pain of both feet ([place]) 05/02/2016  . Urinary hesitancy 03/26/2016  . Recurrent pleural effusion on right 12/13/2015  . GERD (gastroesophageal reflux disease) 09/27/2015  . Rotator cuff (capsule) sprain 09/15/2015  . Leg edema 08/05/2015  . Diabetic ulcer of right foot associated with type 2 diabetes mellitus ([place])   . Paroxysmal atrial fibrillation ([facility])   . AKI (acute kidney injury) ([place])   . Depression, major, recurrent, moderate ([place]) 03/21/2015  . Alcoholic cirrhosis of liver with ascites ([place])   . Essential hypertension   . Pannus, abdominal 02/23/2014  . 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 Poor social situation 11/11/2013  . Alcohol abuse, in remission 11/03/2013  . DM type 2 (diabetes mellitus, type 2) ([place]) 03/25/2012  . Morbid obesity ([place]) 03/25/2012  . Sleep apnea  03/25/2012    Past Surgical History:  Procedure Laterality Date  . HERNIA REPAIR    . KNEE ARTHROSCOPY    . SPLENECTOMY    . TONSILECTOMY, ADENOIDECTOMY, BILATERAL MYRINGOTOMY AND TUBES         Home Medications    Prior to Admission medications   Medication Sig Start Date End Date Taking? Authorizing Provider  albuterol (PROVENTIL HFA;VENTOLIN HFA) 108 (90 Base) MCG/ACT inhaler Inhale 2 puffs into the lungs every 4 (four) hours as needed for wheezing or shortness of breath. 10/05/15   [place] N Rumley, DO  Bismuth Tribromoph-Petrolatum (XEROFORM PETROLAT GAUZE 5"X9") MISC Change dressing twice daily 05/14/15   Zenia Resides, MD  citalopram (CELEXA) 20 MG tablet TAKE 1 TABLET BY MOUTH DAILY. 09/03/15   [place] N Rumley, DO  diltiazem (CARDIZEM CD) 180 MG 24 hr capsule TAKE 1 CAPSULE (180 MG TOTAL) BY MOUTH DAILY. 06/14/15    N Rumley, DO  folic acid (FOLVITE) 1 MG tablet Take 1 tablet (1 mg total) by mouth daily. 02/05/15   Coral Spikes, DO  gabapentin (NEURONTIN) 300 MG capsule Take 1 capsule (300 mg total) by mouth 3 (three) times daily. 05/07/16   Zenia Resides, MD  Gauze Pads & Dressings [facility] BANDAGE ROLL) MISC Change dressing twice daily 05/14/15   Zenia Resides, MD  hydrochlorothiazide (HYDRODIURIL) 12.5 MG tablet Take 1 tablet (12.5 mg total) by mouth daily. 05/12/16  Zenia Resides, MD  ketoconazole (NIZORAL) 2 % cream Apply 1 application topically daily as needed for irritation. 05/22/15   [place] N Rumley, DO  lactulose (CHRONULAC) 10 GM/15ML solution Take 45 mLs (30 g total) by mouth 3 (three) times daily. Patient not taking: Reported on 02/28/2016 05/11/15   Asiyah Cletis Media, MD  Multiple Vitamins-Minerals (CENTRUM SILVER ADULT 50+) TABS Take 125 mg by mouth daily.    Historical Provider, MD  pantoprazole (PROTONIX) 40 MG tablet Take 1 tablet (40 mg total) by mouth daily. 10/30/15   [place] N Rumley, DO  spironolactone (ALDACTONE) 100 MG tablet Take 2 tablets (200  mg total) by mouth daily. 05/07/16   Zenia Resides, MD  thiamine 100 MG tablet Take 1 tablet (100 mg total) by mouth daily. 06/14/15   [place] N Rumley, DO  torsemide (DEMADEX) 100 MG tablet Take 1 tablet (100 mg total) by mouth 2 (two) times daily. 05/07/16   Zenia Resides, MD    Family History History reviewed. No pertinent family history.  Social History Social History  Substance Use Topics  . Smoking status: Former Smoker    Types: Cigarettes    Quit date: 11/01/1974  . Smokeless tobacco: Never Used     Comment: smoked in high school for about a year  . Alcohol use Yes     Allergies   Potassium-containing compounds; Doxycycline; Augmentin [amoxicillin-pot clavulanate]; and Neomycin   Review of Systems Review of Systems  All other systems reviewed and are negative.    Physical Exam Updated Vital Signs BP 166/74 (BP Location: Left Arm)   Pulse 110   Temp 99.8 F (37.7 C)   Resp (!) 30   Ht 6' (1.829 m)   Wt (!) 181.4 kg   SpO2 92%   BMI 54.25 kg/m   Physical Exam  Constitutional: He appears well-developed and well-nourished. No distress.  Morbidly obese  HENT:  Head: Normocephalic and atraumatic.  Mouth/Throat: Oropharynx is clear and moist.  Neck: Normal range of motion. Neck supple.  Cardiovascular: Normal rate, regular rhythm and normal heart sounds.   Pulmonary/Chest: Effort normal and breath sounds normal.  Abdominal: Bowel sounds are normal. He exhibits distension. There is no tenderness.  Morbidly obese abdomen Large pannus of the abdomen, no signs of infection with lifting of the pannus Umbilical hernia, which is soft and non tender, not reducible  Musculoskeletal: Normal range of motion.  Bilateral lower extremity pitting edema  Neurological: He is alert.  Skin: Skin is warm and dry. He is not diaphoretic.  Right lower leg erythema  Nursing note and vitals reviewed.    ED Treatments / Results  Labs (all labs ordered are listed, but only  abnormal results are displayed) Labs Reviewed  URINE CULTURE  COMPREHENSIVE METABOLIC PANEL  CBC WITH DIFFERENTIAL/PLATELET  URINALYSIS, ROUTINE W REFLEX MICROSCOPIC (NOT AT [facility])  BRAIN NATRIURETIC PEPTIDE  I-STAT CG4 LACTIC ACID, ED    EKG  EKG Interpretation None       Radiology No results found.  Procedures Procedures (including critical care time)  Medications Ordered in ED Medications - No data to display   Initial Impression / Assessment and Plan / ED Course  I have reviewed the triage vital signs and the nursing notes.  Pertinent labs & imaging results that were available during my care of the patient were reviewed by me and considered in my medical decision making (see chart for details).  Clinical Course    Patient with a history  of CHF, Morbid obesity, Sepsis, presents today with a temp of 99.8 F, tachycardia, cough, SOB.  Blood pressure stable.  No hypotension.  Labs show an elevated WBC.  Patient with an elevated lactate.  CXR showing possible CAP.  Patient started on IV Vancomycin and Zosyn IV.  Blood cultures ordered and are pending.  Patient given 1 L IVF.  He also has fluid retention and an elevated BNP consistent with a CHF exacerbation.  [facility] transferred to [facility] and admitted to [facility] medicine teaching service.   Final Clinical Impressions(s) / ED Diagnoses   Final diagnoses:  None    New Prescriptions New Prescriptions   No medications on file     Hyman Bible, PA-C 05/20/16 2204

## 2016-05-17 NOTE — ED Provider Notes (Signed)
Medical screening examination/treatment/procedure(s) were conducted as a shared visit with non-physician practitioner(s) and myself.  I personally evaluated the patient during the encounter. Briefly, the patient is a 60 y.o. male who presents for malaise. Patient is afebrile, tachypneic, tachycardic, normotensive. Workup with leukocytosis and elevated lactic acid. Patient given IV fluid. Patient given empiric antibiotics. Chest x-ray with likely pneumonia. Patient admitted for further management.    Fatima Blank, MD 05/19/16 [phone]

## 2016-05-17 NOTE — ED Triage Notes (Signed)
Pt reports a sudden onset of severe chills and fever, denies any other symptoms but subjectively has unwell look to him characterized by hyperventilation, paleness and sense of doom with him stating "something is not right, I do not feel good." Hx of CHF, liver cirrhosis and generalized edema with severity to the scrottum.

## 2016-05-17 NOTE — Progress Notes (Signed)
Pharmacy Antibiotic Note  Matthew [name] is a 60 y.o. male admitted on 05/17/2016 with sepsis.  Pharmacy has been consulted for vancomycin dosing.  Plan: Vancomycin 2.5g IV x 1, then 1250mg  IV every 12 hours.  Goal trough 15-20 mcg/mL.  Check trough at steady state Follow up renal function & cultures Continue Zosyn on admission?  Height: 6' (182.9 cm) Weight: (!) 400 lb (181.4 kg) IBW/kg (Calculated) : 77.6  Temp (24hrs), Avg:99.8 F (37.7 C), Min:99.8 F (37.7 C), Max:99.8 F (37.7 C)   Recent Labs Lab 05/17/16 1840 05/17/16 1854  WBC 23.9*  --   CREATININE 1.08  --   LATICACIDVEN  --  3.55*    Estimated Creatinine Clearance: 124.1 mL/min (by C-G formula based on SCr of 1.08 mg/dL).    Allergies  Allergen Reactions  . Potassium-Containing Compounds Other (See Comments)    Reaction:  Stomach pain   . Doxycycline Other (See Comments)    Reaction:  Right side pain   . Augmentin [Amoxicillin-Pot Clavulanate] Nausea Only and Other (See Comments)    Has patient had a PCN reaction causing immediate rash, facial/tongue/throat swelling, SOB or lightheadedness with hypotension: Unsure Has patient had a PCN reaction causing severe rash involving mucus membranes or skin necrosis: Unsure Has patient had a PCN reaction that required hospitalization Unsure Has patient had a PCN reaction occurring within the last 10 years: Unsure If all of the above answers are "NO", then may proceed with Cephalosporin use.  [name] Neomycin Rash    Antimicrobials this admission: 8/19 Vanc >> 8/19 Zosyn x 1  Dose adjustments this admission: ---  Microbiology results: 8/19 BCx: sent 8/19 UCx: sent  Thank you for allowing pharmacy to be a part of this patient's care.  Peggyann Juba, PharmD, BCPS Pager: [phone] 05/17/2016 7:51 PM

## 2016-05-18 ENCOUNTER — Encounter (HOSPITAL_COMMUNITY): Payer: Self-pay | Admitting: *Deleted

## 2016-05-18 ENCOUNTER — Inpatient Hospital Stay (HOSPITAL_COMMUNITY): Payer: Medicare Other

## 2016-05-18 DIAGNOSIS — R0602 Shortness of breath: Secondary | ICD-10-CM

## 2016-05-18 DIAGNOSIS — L304 Erythema intertrigo: Secondary | ICD-10-CM | POA: Diagnosis present

## 2016-05-18 DIAGNOSIS — K7031 Alcoholic cirrhosis of liver with ascites: Secondary | ICD-10-CM | POA: Diagnosis not present

## 2016-05-18 DIAGNOSIS — L03115 Cellulitis of right lower limb: Secondary | ICD-10-CM | POA: Diagnosis present

## 2016-05-18 DIAGNOSIS — B9562 Methicillin resistant Staphylococcus aureus infection as the cause of diseases classified elsewhere: Secondary | ICD-10-CM | POA: Diagnosis not present

## 2016-05-18 DIAGNOSIS — E8770 Fluid overload, unspecified: Secondary | ICD-10-CM | POA: Diagnosis present

## 2016-05-18 DIAGNOSIS — J189 Pneumonia, unspecified organism: Secondary | ICD-10-CM | POA: Diagnosis not present

## 2016-05-18 DIAGNOSIS — R609 Edema, unspecified: Secondary | ICD-10-CM | POA: Diagnosis not present

## 2016-05-18 DIAGNOSIS — K219 Gastro-esophageal reflux disease without esophagitis: Secondary | ICD-10-CM | POA: Diagnosis present

## 2016-05-18 DIAGNOSIS — G4733 Obstructive sleep apnea (adult) (pediatric): Secondary | ICD-10-CM | POA: Diagnosis present

## 2016-05-18 DIAGNOSIS — M79609 Pain in unspecified limb: Secondary | ICD-10-CM | POA: Diagnosis not present

## 2016-05-18 DIAGNOSIS — Z87891 Personal history of nicotine dependence: Secondary | ICD-10-CM | POA: Diagnosis not present

## 2016-05-18 DIAGNOSIS — K7030 Alcoholic cirrhosis of liver without ascites: Secondary | ICD-10-CM | POA: Diagnosis present

## 2016-05-18 DIAGNOSIS — R601 Generalized edema: Secondary | ICD-10-CM | POA: Diagnosis present

## 2016-05-18 DIAGNOSIS — A4102 Sepsis due to Methicillin resistant Staphylococcus aureus: Secondary | ICD-10-CM | POA: Diagnosis present

## 2016-05-18 DIAGNOSIS — E1142 Type 2 diabetes mellitus with diabetic polyneuropathy: Secondary | ICD-10-CM | POA: Diagnosis present

## 2016-05-18 DIAGNOSIS — E11621 Type 2 diabetes mellitus with foot ulcer: Secondary | ICD-10-CM | POA: Diagnosis present

## 2016-05-18 DIAGNOSIS — R7881 Bacteremia: Secondary | ICD-10-CM | POA: Diagnosis not present

## 2016-05-18 DIAGNOSIS — Z79899 Other long term (current) drug therapy: Secondary | ICD-10-CM | POA: Diagnosis not present

## 2016-05-18 DIAGNOSIS — I10 Essential (primary) hypertension: Secondary | ICD-10-CM | POA: Diagnosis present

## 2016-05-18 DIAGNOSIS — R0902 Hypoxemia: Secondary | ICD-10-CM | POA: Diagnosis not present

## 2016-05-18 DIAGNOSIS — Z9889 Other specified postprocedural states: Secondary | ICD-10-CM | POA: Diagnosis not present

## 2016-05-18 DIAGNOSIS — Z6841 Body Mass Index (BMI) 40.0 and over, adult: Secondary | ICD-10-CM | POA: Diagnosis not present

## 2016-05-18 DIAGNOSIS — R600 Localized edema: Secondary | ICD-10-CM | POA: Diagnosis not present

## 2016-05-18 DIAGNOSIS — I517 Cardiomegaly: Secondary | ICD-10-CM | POA: Diagnosis not present

## 2016-05-18 DIAGNOSIS — N179 Acute kidney failure, unspecified: Secondary | ICD-10-CM | POA: Diagnosis present

## 2016-05-18 DIAGNOSIS — L97519 Non-pressure chronic ulcer of other part of right foot with unspecified severity: Secondary | ICD-10-CM | POA: Diagnosis present

## 2016-05-18 DIAGNOSIS — F1010 Alcohol abuse, uncomplicated: Secondary | ICD-10-CM | POA: Diagnosis not present

## 2016-05-18 DIAGNOSIS — J90 Pleural effusion, not elsewhere classified: Secondary | ICD-10-CM | POA: Diagnosis present

## 2016-05-18 DIAGNOSIS — Z888 Allergy status to other drugs, medicaments and biological substances status: Secondary | ICD-10-CM | POA: Diagnosis not present

## 2016-05-18 DIAGNOSIS — F331 Major depressive disorder, recurrent, moderate: Secondary | ICD-10-CM | POA: Diagnosis present

## 2016-05-18 DIAGNOSIS — I482 Chronic atrial fibrillation: Secondary | ICD-10-CM | POA: Diagnosis present

## 2016-05-18 DIAGNOSIS — Z881 Allergy status to other antibiotic agents status: Secondary | ICD-10-CM | POA: Diagnosis not present

## 2016-05-18 LAB — CBC WITH DIFFERENTIAL/PLATELET
BASOS PCT: 0 %
Basophils Absolute: 0 10*3/uL (ref 0.0–0.1)
EOS PCT: 0 %
Eosinophils Absolute: 0 10*3/uL (ref 0.0–0.7)
HCT: 45.8 % (ref 39.0–52.0)
HEMOGLOBIN: 15.7 g/dL (ref 13.0–17.0)
LYMPHS PCT: 7 %
Lymphs Abs: 2.2 10*3/uL (ref 0.7–4.0)
MCH: 32.4 pg (ref 26.0–34.0)
MCHC: 34.3 g/dL (ref 30.0–36.0)
MCV: 94.6 fL (ref 78.0–100.0)
Monocytes Absolute: 2.2 10*3/uL — ABNORMAL HIGH (ref 0.1–1.0)
Monocytes Relative: 7 %
NEUTROS ABS: 26.9 10*3/uL — AB (ref 1.7–7.7)
NEUTROS PCT: 86 %
Platelets: 434 10*3/uL — ABNORMAL HIGH (ref 150–400)
RBC: 4.84 MIL/uL (ref 4.22–5.81)
RDW: 16 % — ABNORMAL HIGH (ref 11.5–15.5)
WBC: 31.3 10*3/uL — ABNORMAL HIGH (ref 4.0–10.5)

## 2016-05-18 LAB — BLOOD CULTURE ID PANEL (REFLEXED)
ACINETOBACTER BAUMANNII: NOT DETECTED
CANDIDA ALBICANS: NOT DETECTED
CANDIDA TROPICALIS: NOT DETECTED
Candida glabrata: NOT DETECTED
Candida krusei: NOT DETECTED
Candida parapsilosis: NOT DETECTED
Carbapenem resistance: NOT DETECTED
ENTEROBACTER CLOACAE COMPLEX: NOT DETECTED
ENTEROCOCCUS SPECIES: NOT DETECTED
Enterobacteriaceae species: NOT DETECTED
Escherichia coli: NOT DETECTED
HAEMOPHILUS INFLUENZAE: NOT DETECTED
Klebsiella oxytoca: NOT DETECTED
Klebsiella pneumoniae: NOT DETECTED
LISTERIA MONOCYTOGENES: NOT DETECTED
METHICILLIN RESISTANCE: DETECTED — AB
Neisseria meningitidis: NOT DETECTED
PROTEUS SPECIES: NOT DETECTED
Pseudomonas aeruginosa: NOT DETECTED
STAPHYLOCOCCUS AUREUS BCID: DETECTED — AB
STREPTOCOCCUS PNEUMONIAE: NOT DETECTED
STREPTOCOCCUS PYOGENES: NOT DETECTED
Serratia marcescens: NOT DETECTED
Staphylococcus species: DETECTED — AB
Streptococcus agalactiae: NOT DETECTED
Streptococcus species: NOT DETECTED
VANCOMYCIN RESISTANCE: NOT DETECTED

## 2016-05-18 LAB — URINALYSIS, ROUTINE W REFLEX MICROSCOPIC
BILIRUBIN URINE: NEGATIVE
Glucose, UA: NEGATIVE mg/dL
KETONES UR: NEGATIVE mg/dL
NITRITE: NEGATIVE
PH: 6 (ref 5.0–8.0)
PROTEIN: NEGATIVE mg/dL
Specific Gravity, Urine: 1.013 (ref 1.005–1.030)

## 2016-05-18 LAB — GLUCOSE, CAPILLARY
Glucose-Capillary: 123 mg/dL — ABNORMAL HIGH (ref 65–99)
Glucose-Capillary: 124 mg/dL — ABNORMAL HIGH (ref 65–99)
Glucose-Capillary: 202 mg/dL — ABNORMAL HIGH (ref 65–99)
Glucose-Capillary: 94 mg/dL (ref 65–99)

## 2016-05-18 LAB — COMPREHENSIVE METABOLIC PANEL
ALK PHOS: 174 U/L — AB (ref 38–126)
ALT: 21 U/L (ref 17–63)
ANION GAP: 11 (ref 5–15)
AST: 41 U/L (ref 15–41)
Albumin: 2.2 g/dL — ABNORMAL LOW (ref 3.5–5.0)
BILIRUBIN TOTAL: 2.1 mg/dL — AB (ref 0.3–1.2)
BUN: 23 mg/dL — ABNORMAL HIGH (ref 6–20)
CALCIUM: 9.5 mg/dL (ref 8.9–10.3)
CO2: 31 mmol/L (ref 22–32)
CREATININE: 1.83 mg/dL — AB (ref 0.61–1.24)
Chloride: 92 mmol/L — ABNORMAL LOW (ref 101–111)
GFR, EST AFRICAN AMERICAN: 45 mL/min — AB (ref >60)
GFR, EST NON AFRICAN AMERICAN: 39 mL/min — AB (ref >60)
Glucose, Bld: 144 mg/dL — ABNORMAL HIGH (ref 65–99)
Potassium: 3.8 mmol/L (ref 3.5–5.1)
Sodium: 134 mmol/L — ABNORMAL LOW (ref 135–145)
TOTAL PROTEIN: 7.9 g/dL (ref 6.5–8.1)

## 2016-05-18 LAB — LACTIC ACID, PLASMA
LACTIC ACID, VENOUS: 2.2 mmol/L — AB (ref 0.5–1.9)
LACTIC ACID, VENOUS: 2.2 mmol/L — AB (ref 0.5–1.9)
LACTIC ACID, VENOUS: 2.8 mmol/L — AB (ref 0.5–1.9)
Lactic Acid, Venous: 2 mmol/L (ref 0.5–1.9)
Lactic Acid, Venous: 1.8 mmol/L (ref 0.5–1.9)

## 2016-05-18 LAB — ECHOCARDIOGRAM COMPLETE
CHL CUP TV REG PEAK VELOCITY: 141 cm/s
FS: 36 % (ref 28–44)
Height: 70 in
IV/PV OW: 1.2
LA diam end sys: 45 mm
LA diam index: 1.58 cm/m2
LA vol: 73.8 mL
LASIZE: 45 mm
LAVOLA4C: 81.3 mL
LAVOLIN: 25.9 mL/m2
LDCA: 3.8 cm2
LVOTD: 22 mm
PW: 9.35 mm — AB (ref 0.6–1.1)
TRMAXVEL: 141 cm/s
Weight: 6651.2 oz

## 2016-05-18 LAB — I-STAT CG4 LACTIC ACID, ED: Lactic Acid, Venous: 2.7 mmol/L (ref 0.5–1.9)

## 2016-05-18 LAB — PROTIME-INR
INR: 1.53
Prothrombin Time: 18.5 seconds — ABNORMAL HIGH (ref 11.4–15.2)

## 2016-05-18 LAB — URINE MICROSCOPIC-ADD ON

## 2016-05-18 LAB — PROCALCITONIN: Procalcitonin: 2.43 ng/mL

## 2016-05-18 LAB — CREATININE, URINE, RANDOM: CREATININE, URINE: 65.33 mg/dL

## 2016-05-18 LAB — APTT: APTT: 33 s (ref 24–36)

## 2016-05-18 MED ORDER — SODIUM CHLORIDE 0.9 % IV BOLUS (SEPSIS)
1000.0000 mL | Freq: Once | INTRAVENOUS | Status: AC
  Administered 2016-05-18: 1000 mL via INTRAVENOUS

## 2016-05-18 MED ORDER — GABAPENTIN 100 MG PO CAPS
100.0000 mg | ORAL_CAPSULE | Freq: Three times a day (TID) | ORAL | Status: DC
  Administered 2016-05-18 – 2016-05-27 (×28): 100 mg via ORAL
  Filled 2016-05-18 (×29): qty 1

## 2016-05-18 MED ORDER — LORAZEPAM 2 MG/ML IJ SOLN: 1.0000 mg | Freq: Four times a day (QID) | INTRAMUSCULAR | Status: AC | PRN

## 2016-05-18 MED ORDER — VITAMIN B-1 100 MG PO TABS
100.0000 mg | ORAL_TABLET | Freq: Every day | ORAL | Status: DC
  Administered 2016-05-18 – 2016-05-27 (×10): 100 mg via ORAL
  Filled 2016-05-18 (×10): qty 1

## 2016-05-18 MED ORDER — DILTIAZEM HCL ER COATED BEADS 180 MG PO CP24
180.0000 mg | ORAL_CAPSULE | Freq: Every day | ORAL | Status: DC
  Administered 2016-05-18 – 2016-05-27 (×10): 180 mg via ORAL
  Filled 2016-05-18 (×10): qty 1

## 2016-05-18 MED ORDER — VANCOMYCIN HCL 10 G IV SOLR
1250.0000 mg | Freq: Two times a day (BID) | INTRAVENOUS | Status: DC
  Administered 2016-05-18 – 2016-05-19 (×3): 1250 mg via INTRAVENOUS
  Filled 2016-05-18 (×4): qty 1250

## 2016-05-18 MED ORDER — PERFLUTREN LIPID MICROSPHERE
INTRAVENOUS | Status: AC
  Filled 2016-05-18: qty 10

## 2016-05-18 MED ORDER — ADULT MULTIVITAMIN W/MINERALS CH
1.0000 | ORAL_TABLET | Freq: Every day | ORAL | Status: DC
Start: 2016-05-18 — End: 2016-05-27
  Administered 2016-05-18 – 2016-05-27 (×10): 1 via ORAL
  Filled 2016-05-18 (×11): qty 1

## 2016-05-18 MED ORDER — THIAMINE HCL 100 MG/ML IJ SOLN: 100.0000 mg | Freq: Every day | INTRAMUSCULAR | Status: DC

## 2016-05-18 MED ORDER — FOLIC ACID 1 MG PO TABS
1.0000 mg | ORAL_TABLET | Freq: Every day | ORAL | Status: DC
  Administered 2016-05-18 – 2016-05-27 (×10): 1 mg via ORAL
  Filled 2016-05-18 (×10): qty 1

## 2016-05-18 MED ORDER — INSULIN ASPART 100 UNIT/ML ~~LOC~~ SOLN
0.0000 [IU] | Freq: Every day | SUBCUTANEOUS | Status: DC
  Administered 2016-05-20: 4 [IU] via SUBCUTANEOUS

## 2016-05-18 MED ORDER — SODIUM CHLORIDE 0.9% FLUSH
3.0000 mL | Freq: Two times a day (BID) | INTRAVENOUS | Status: DC
  Administered 2016-05-18 – 2016-05-26 (×17): 3 mL via INTRAVENOUS

## 2016-05-18 MED ORDER — LORAZEPAM 1 MG PO TABS
1.0000 mg | ORAL_TABLET | Freq: Four times a day (QID) | ORAL | Status: AC | PRN
  Administered 2016-05-20: 1 mg via ORAL
  Filled 2016-05-18: qty 1

## 2016-05-18 MED ORDER — INSULIN ASPART 100 UNIT/ML ~~LOC~~ SOLN
0.0000 [IU] | Freq: Three times a day (TID) | SUBCUTANEOUS | Status: DC
  Administered 2016-05-18: 3 [IU] via SUBCUTANEOUS
  Administered 2016-05-18 – 2016-05-19 (×3): 1 [IU] via SUBCUTANEOUS
  Administered 2016-05-19 – 2016-05-21 (×2): 2 [IU] via SUBCUTANEOUS
  Administered 2016-05-24: 1 [IU] via SUBCUTANEOUS

## 2016-05-18 MED ORDER — PERFLUTREN LIPID MICROSPHERE
6.0000 mL | INTRAVENOUS | Status: AC | PRN
  Filled 2016-05-18: qty 10

## 2016-05-18 MED ORDER — SODIUM CHLORIDE 0.9 % IV SOLN
INTRAVENOUS | Status: AC
  Administered 2016-05-18 (×2): via INTRAVENOUS

## 2016-05-18 MED ORDER — ENOXAPARIN SODIUM 80 MG/0.8ML ~~LOC~~ SOLN
80.0000 mg | SUBCUTANEOUS | Status: DC
Start: 2016-05-18 — End: 2016-05-19
  Administered 2016-05-18 – 2016-05-19 (×2): 80 mg via SUBCUTANEOUS
  Filled 2016-05-18 (×2): qty 0.8

## 2016-05-18 MED ORDER — PIPERACILLIN-TAZOBACTAM 3.375 G IVPB
3.3750 g | Freq: Three times a day (TID) | INTRAVENOUS | Status: DC
  Administered 2016-05-18 – 2016-05-19 (×3): 3.375 g via INTRAVENOUS
  Filled 2016-05-18 (×6): qty 50

## 2016-05-18 MED ORDER — FUROSEMIDE 10 MG/ML IJ SOLN
60.0000 mg | Freq: Once | INTRAMUSCULAR | Status: AC
  Administered 2016-05-18: 60 mg via INTRAVENOUS
  Filled 2016-05-18: qty 6

## 2016-05-18 NOTE — Progress Notes (Signed)
Repeat  Lactic acid  Is 2.2. Md paged to inform

## 2016-05-18 NOTE — Progress Notes (Addendum)
Lactic acid reported as 2.8. Dr Burr Medico aware. NS bolus in progress

## 2016-05-18 NOTE — Progress Notes (Signed)
PHARMACY - PHYSICIAN COMMUNICATION CRITICAL VALUE ALERT - BLOOD CULTURE IDENTIFICATION (BCID)  Results for orders placed or performed during the hospital encounter of 05/17/16  Blood Culture ID Panel (Reflexed) (Collected: 05/17/2016  6:35 PM)  Result Value Ref Range   Enterococcus species NOT DETECTED NOT DETECTED   Vancomycin resistance NOT DETECTED NOT DETECTED   Listeria monocytogenes NOT DETECTED NOT DETECTED   Staphylococcus species DETECTED (A) NOT DETECTED   Staphylococcus aureus DETECTED (A) NOT DETECTED   Methicillin resistance DETECTED (A) NOT DETECTED   Streptococcus species NOT DETECTED NOT DETECTED   Streptococcus agalactiae NOT DETECTED NOT DETECTED   Streptococcus pneumoniae NOT DETECTED NOT DETECTED   Streptococcus pyogenes NOT DETECTED NOT DETECTED   Acinetobacter baumannii NOT DETECTED NOT DETECTED   Enterobacteriaceae species NOT DETECTED NOT DETECTED   Enterobacter cloacae complex NOT DETECTED NOT DETECTED   Escherichia coli NOT DETECTED NOT DETECTED   Klebsiella oxytoca NOT DETECTED NOT DETECTED   Klebsiella pneumoniae NOT DETECTED NOT DETECTED   Proteus species NOT DETECTED NOT DETECTED   Serratia marcescens NOT DETECTED NOT DETECTED   Carbapenem resistance NOT DETECTED NOT DETECTED   Haemophilus influenzae NOT DETECTED NOT DETECTED   Neisseria meningitidis NOT DETECTED NOT DETECTED   Pseudomonas aeruginosa NOT DETECTED NOT DETECTED   Candida albicans NOT DETECTED NOT DETECTED   Candida glabrata NOT DETECTED NOT DETECTED   Candida krusei NOT DETECTED NOT DETECTED   Candida parapsilosis NOT DETECTED NOT DETECTED   Candida tropicalis NOT DETECTED NOT DETECTED    Name of physician (or Provider) Contacted: None  Changes to prescribed antibiotics required: None--already on vancomycin  Nani Skillern Crowford 05/18/2016  10:57 PM

## 2016-05-18 NOTE — Progress Notes (Signed)
Bariatric bed recommended by wound care nurse. Pt had already declined bed this am. I spoke with pt again this afternoon and explained that the bed he is in does not provide the right support for his weight and that he is at risk for skin breakdown. He still refuses and says that he cannot get any rest in the bariatric bed. Wound nurse aware

## 2016-05-18 NOTE — Progress Notes (Addendum)
Echocardiogram 2D Echocardiogram with contrast has been performed.  Joelene Millin 05/18/2016, 2:08 PM

## 2016-05-18 NOTE — Consult Note (Signed)
[facility] Nurse wound consult note Reason for Consult: Patient with RLE cellulitis, no wounds, LLE edema.  Intertriginous dermatitis (ITD) in the sub-pannicular skin fold. Wound type:infection, moisture Pressure Ulcer POA: No Measurement: N/A Wound bed:N/A Drainage (amount, consistency, odor) None from LEs or sub pannicular region Periwound: small amount of maceration in the sub-pannicular region Dressing procedure/placement/frequency: I will add a bariatric bed with low air loss feature as patients body habitus and weight exceed the standard bed capacity. Additionally, we will add bilateral pressure redistribution heel boots.  Our house antimicrobial textile (InterDry Ag+) has been provided to manage moisture in the skin fold beneath the panus. Family Practice MD is here during my visit and agrees that the LEs require observation at this time.  I do not recommend compression wrap placement at this time as LEs are elevated and will be in heel boots. [facility] nursing team will not follow, but will remain available to this patient, the nursing and medical teams.  Please re-consult if needed. Thanks, Maudie Flakes, MSN, RN, [place], Arther Abbott  Pager# [phone]

## 2016-05-18 NOTE — Progress Notes (Signed)
FPTS Interim Progress Note  S: Pt resting comfortably, disappointed that he cannot eat while awaiting the abdominal US.  O: BP (!) 104/47 (BP Location: Left Arm)   Pulse 93   Temp 98.7 F (37.1 C) (Oral)   Resp 18   Ht 5\' 10"  (1.778 m)   Wt (!) 188.6 kg (415 lb 11.2 oz)   SpO2 96%   BMI 59.65 kg/m     A/P: Sepsis of unknown source: Presented with feeling unwell and chills at home. Has been afebrile. In [facility] ED, tachypneic to 30, BP 111/73, HR 114.  Lactate 3.55, WBC 23.9. Patient meeting sepsis criteria w/ suspected source of infection RLE cellulitis.  Lactate decreased to 2.70 with 1L bolus. Repeat lactate here 2.2. Vanc/zosyn given. UA with large leuks, no nitrites, no bacteria. CXR with vascular congestion, Small right pleural effusion noted. Borderline cardiomegaly. Increased interstitial markings may reflect mild interstitial edema. Possible alternative sources include bilateral lateral midfoot eschars, urine (less likely), SBP, or pneumonia. APTT 33. Procalcitonin 2.43. INR 1.53. WBC increased to 31.3.  -monitor urine and blood cultures -additional 1L bolus.  Somewhat counterintuitive given elevated BNP.  Patient chronically fluid overloaded.  Not having any respiratory distress/ O2 requirement.  I do think he needs diuresis.  However, in the setting of acute illness, will fluid resuscitate then work on diuresis. -continue vanc and zosyn -consider IR Diagnostic paracentesis -CBC with diff -CMP  -abdominal US   Anasarca and alcoholic cirrhosis: Wt [id number] today. Most recent weight in clinic 409. Recent weight increase in clinic led to increasing spiro and HCTZ. Also on Demadex outpatient.  BNP >600 (baseline ~200).  Last echo 01/2015 EF 0000000, normal systolic function, wall motion poorly visualized. -IV lasix 60mg  q6h (roughly his home dose of Demadex and Spiro converted to Lasix IV).  First dose ordered for 0830.  Will need to reassess BP, clinical picture and determine proceeding with  repeat Lasix doses.  May also need to increase dose for better diuresis. -Strict I/Os -Daily weights -holding home HCTZ, spiro, and torsemide -ECHO to consider possible fluid sources: CHF vs ESLD -abdominal US   DMII: Diet controlled. Cover with SSI. Most recent A1C 6.0 in April. -repeat HgbA1c -continue home neurontin -sensitive sliding scale insulin -monitor CBGs  AKI: Cr baseline 1.08. Cr now 1.83.  - continue to monitor  HTN: Home meds are HCTZ 12.5mg  daily.  -holding HCTZ in the setting of IV lasix use  Afib: Rate controlled on dilt, will continue.  -continue dilt w/ holding parameters SBP<110, DBP<60  OSA: Uses CPAP at home.  -continue CPAP qhs  FEN/GI: heart healthy/carb mod diet, SSI Prophylaxis: lovenox  Sela Hilding, MD 05/18/2016, 6:52 AM PGY-1, [facility] Medicine Service pager [phone]

## 2016-05-18 NOTE — ED Notes (Signed)
RN and PA notified of patient's Lactic Acid of 2.70

## 2016-05-18 NOTE — Progress Notes (Cosign Needed)
FPTS Interim Progress Note  S:Was paged by nursing with questions regarding whether to give AM IV Lasix given that we have also s/p 1L fluid bolus with possible sepsis picture.   O: BP (!) 108/55 (BP Location: Left Arm)   Pulse 93   Temp 98.7 F (37.1 C) (Oral)   Resp 18   Ht 5\' 10"  (1.778 m)   Wt (!) 415 lb 11.2 oz (188.6 kg)   SpO2 96%   BMI 59.65 kg/m    GEN: Pt comfortable, NAD CARD: RRR, no m/r/g PULM: Difficult to assess with large body habitus and distant lung sounds, but +sparse bibasilar rales appreciated EXT: +Right-sided LE redness and edema, well de-marcated, warm, 2+ LE edema bilaterally, no sacral edema   A/P: 1. Fluid Overload in setting of sepsis: Given patient appears overloaded with LE edema and some bibasilar rales on exam, will go ahead with AM Lasix 60 mg IV as planned by the admitting team.   - Will continue to assess volume overload in the setting of possible sepsis. - continue holding HCTZ because pt receiving IV lasix - will follow up echo results  D/w senior resident.  Everrett Coombe, MD 05/18/2016, 10:30 AM PGY-1, [facility] Medicine Service pager [phone]

## 2016-05-19 DIAGNOSIS — R7881 Bacteremia: Secondary | ICD-10-CM

## 2016-05-19 DIAGNOSIS — L03115 Cellulitis of right lower limb: Secondary | ICD-10-CM

## 2016-05-19 DIAGNOSIS — A4102 Sepsis due to Methicillin resistant Staphylococcus aureus: Principal | ICD-10-CM

## 2016-05-19 DIAGNOSIS — B9562 Methicillin resistant Staphylococcus aureus infection as the cause of diseases classified elsewhere: Secondary | ICD-10-CM

## 2016-05-19 LAB — BASIC METABOLIC PANEL
Anion gap: 8 (ref 5–15)
BUN: 30 mg/dL — AB (ref 6–20)
CO2: 29 mmol/L (ref 22–32)
CREATININE: 1.75 mg/dL — AB (ref 0.61–1.24)
Calcium: 8.4 mg/dL — ABNORMAL LOW (ref 8.9–10.3)
Chloride: 97 mmol/L — ABNORMAL LOW (ref 101–111)
GFR, EST AFRICAN AMERICAN: 47 mL/min — AB (ref >60)
GFR, EST NON AFRICAN AMERICAN: 41 mL/min — AB (ref >60)
Glucose, Bld: 97 mg/dL (ref 65–99)
POTASSIUM: 3.5 mmol/L (ref 3.5–5.1)
SODIUM: 134 mmol/L — AB (ref 135–145)

## 2016-05-19 LAB — HEMOGLOBIN A1C
HEMOGLOBIN A1C: 6.9 % — AB (ref 4.8–5.6)
Mean Plasma Glucose: 151 mg/dL

## 2016-05-19 LAB — CBC
HEMATOCRIT: 42.1 % (ref 39.0–52.0)
Hemoglobin: 14.1 g/dL (ref 13.0–17.0)
MCH: 31.9 pg (ref 26.0–34.0)
MCHC: 33.5 g/dL (ref 30.0–36.0)
MCV: 95.2 fL (ref 78.0–100.0)
PLATELETS: 423 10*3/uL — AB (ref 150–400)
RBC: 4.42 MIL/uL (ref 4.22–5.81)
RDW: 15.9 % — AB (ref 11.5–15.5)
WBC: 22.6 10*3/uL — AB (ref 4.0–10.5)

## 2016-05-19 LAB — GLUCOSE, CAPILLARY
GLUCOSE-CAPILLARY: 131 mg/dL — AB (ref 65–99)
GLUCOSE-CAPILLARY: 172 mg/dL — AB (ref 65–99)
GLUCOSE-CAPILLARY: 113 mg/dL — AB (ref 65–99)
Glucose-Capillary: 133 mg/dL — ABNORMAL HIGH (ref 65–99)

## 2016-05-19 LAB — VANCOMYCIN, TROUGH: Vancomycin Tr: 31 ug/mL (ref 15–20)

## 2016-05-19 LAB — UREA NITROGEN, URINE: UREA NITROGEN UR: 358 mg/dL

## 2016-05-19 MED ORDER — POTASSIUM CHLORIDE CRYS ER 20 MEQ PO TBCR
40.0000 meq | EXTENDED_RELEASE_TABLET | Freq: Once | ORAL | Status: AC
  Administered 2016-05-19: 40 meq via ORAL
  Filled 2016-05-19: qty 2

## 2016-05-19 MED ORDER — VANCOMYCIN HCL 10 G IV SOLR
1750.0000 mg | INTRAVENOUS | Status: DC
  Administered 2016-05-20 – 2016-05-22 (×3): 1750 mg via INTRAVENOUS
  Filled 2016-05-19 (×4): qty 1750

## 2016-05-19 MED ORDER — ENOXAPARIN SODIUM 100 MG/ML ~~LOC~~ SOLN
0.5000 mg/kg | SUBCUTANEOUS | Status: DC
  Administered 2016-05-20 – 2016-05-27 (×8): 95 mg via SUBCUTANEOUS
  Filled 2016-05-19 (×8): qty 1

## 2016-05-19 NOTE — Consult Note (Signed)
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 diltiazem  180 mg Oral Daily  . [START ON 05/20/2016] enoxaparin (LOVENOX) injection  0.5 mg/kg Subcutaneous Q24H  . folic acid  1 mg Oral Daily  . gabapentin  100 mg Oral TID  . insulin aspart  0-5 Units Subcutaneous QHS  . insulin aspart  0-9 Units Subcutaneous TID WC  . multivitamin with minerals  1 tablet Oral Daily  . sodium chloride flush  3 mL Intravenous Q12H  . thiamine  100 mg Oral Daily   Or  . thiamine  100 mg Intravenous Daily  . vancomycin  1,250 mg Intravenous Q12H    Recommendations: 1. Continue vancomycin 2. Repeat blood cultures 3. Hold off on PICC placement for now 4. Agree with TEE   Assessment: He has recurrent lower extremity cellulitis complicated by transient MRSA bacteremia. No evidence of endocarditis was noted on TTE but this is not at all sensitive given his body habitus. I've ordered repeat blood cultures. I would recommend holding off on PICC placement until we are certain they are negative. I agree with TEE. I will follow-up on Wednesday, 05/21/2016.    HPI: Matthew [name] is a 60 y.o. male with morbid obesity and a history of recurrent right leg cellulitis. He recently started having increased soreness in his  right lower leg and then began having rigors leading to admission on 05/17/2016. He was started on broad empiric antibiotic therapy. One of 2 admission blood cultures has grown MRSA. He is feeling better. No evidence of endocarditis was seen on transthoracic echocardiogram.   Review of Systems: Review of Systems  Constitutional: Positive for chills and malaise/fatigue. Negative for diaphoresis, fever and weight loss.  HENT: Negative for sore throat.   Respiratory: Negative for cough, sputum production and shortness of breath.   Cardiovascular: Negative for chest pain.  Gastrointestinal: Negative for abdominal pain, diarrhea, nausea and vomiting.  Genitourinary: Negative for dysuria and frequency.  Musculoskeletal: Negative for joint pain and myalgias.       Right lower leg pain.  Skin: Negative for rash.  Neurological: Negative for headaches.    Past Medical History:  Diagnosis Date  . Atrial fibrillation, chronic ([place])   . CHF (congestive heart failure) ([place])   . GERD (gastroesophageal reflux disease)   . Hypertension   . Septic shock ([place])   . Shortness of breath dyspnea     Social History  Substance Use Topics  . Smoking status: Former Smoker    Types: Cigarettes    Quit date: 11/01/1974  . Smokeless tobacco: Never Used     Comment: smoked in high school  for about a year  . Alcohol use Yes    History reviewed. No pertinent family history. Allergies  Allergen Reactions  . Potassium-Containing Compounds Other (See Comments)    Reaction:  Stomach pain   . Doxycycline Other (See Comments)    Reaction:  Right side pain   . Augmentin [Amoxicillin-Pot Clavulanate] Nausea Only and Other (See Comments)    Has patient had a PCN reaction causing immediate rash, facial/tongue/throat swelling, SOB or lightheadedness with hypotension: No Has patient had a PCN reaction causing severe rash involving mucus membranes or skin necrosis: No Has patient had a PCN reaction that required  hospitalization No Has patient had a PCN reaction occurring within the last 10 years: Yes If all of the above answers are "NO", then may proceed with Cephalosporin use.  [name] Neomycin Rash    OBJECTIVE: Blood pressure 117/68, pulse 74, temperature 97.7 F (36.5 C), temperature source Oral, resp. rate 20, height 5\' 10"  (1.778 m), weight (!) 421 lb 8 oz (191.2 kg), SpO2 92 %.  Physical Exam  Constitutional: He is oriented to person, place, and time.  He is morbidly obese but in no distress.  Cardiovascular: Normal rate and regular rhythm.   No murmur heard. Very distant heart sounds.  Pulmonary/Chest: Effort normal and breath sounds normal. He has no wheezes. He has no rales.  Abdominal: Soft. There is no tenderness.  Musculoskeletal: Normal range of motion. He exhibits edema and tenderness.  Right lower leg is erythematous and warm to the touch. The cellulitis has not advanced outside of the inked borders. He has dry scaly skin.  Neurological: He is alert and oriented to person, place, and time.  Psychiatric: Mood and affect normal.    Lab Results Lab Results  Component Value Date   WBC 22.6 (H) 05/19/2016   HGB 14.1 05/19/2016   HCT 42.1 05/19/2016   MCV 95.2 05/19/2016   PLT 423 (H) 05/19/2016    Lab Results  Component Value Date   CREATININE 1.75 (H) 05/19/2016   BUN 30 (H) 05/19/2016   NA 134 (L) 05/19/2016   K 3.5 05/19/2016   CL 97 (L) 05/19/2016   CO2 29 05/19/2016    Lab Results  Component Value Date   ALT 21 05/18/2016   AST 41 05/18/2016   ALKPHOS 174 (H) 05/18/2016   BILITOT 2.1 (H) 05/18/2016     Microbiology: Recent Results (from the past 240 hour(s))  Blood culture (routine x 2)     Status: Abnormal (Preliminary result)   Collection Time: 05/17/16  6:35 PM  Result Value Ref Range Status   Specimen Description BLOOD LEFT HAND  Final   Special Requests IN PEDIATRIC BOTTLE 6CC  Final   Culture  Setup Time   Final    GRAM POSITIVE COCCI IN  CLUSTERS AEROBIC BOTTLE ONLY CRITICAL RESULT CALLED TO, READ BACK BY AND VERIFIED WITH: J GRIMSLEY,PHARMD AT 2255 05/18/16 BY L BENFIELD Performed at [facility] (A)  Final   Report Status PENDING  Incomplete  Blood Culture ID Panel (Reflexed)     Status: Abnormal   Collection Time: 05/17/16  6:35 PM  Result Value Ref Range Status   Enterococcus species NOT DETECTED NOT DETECTED Final   Vancomycin resistance NOT DETECTED NOT DETECTED Final   Listeria monocytogenes NOT DETECTED NOT DETECTED Final   Staphylococcus species DETECTED (A) NOT DETECTED Final    Comment: CRITICAL RESULT CALLED TO, READ BACK BY AND VERIFIED WITH:  TO JGRIMSLEY(PHARD) BY [facility] 05/18/2016 AT 10:55PM    Staphylococcus aureus DETECTED (A) NOT DETECTED Final    Comment: CRITICAL RESULT CALLED TO, READ BACK BY AND VERIFIED WITH: TO JGRIMSLEY(PHARD) BY TCLEVELAND 05/18/2016 AT 10:55PM    Methicillin resistance DETECTED (A) NOT DETECTED Final    Comment: CRITICAL RESULT CALLED TO, READ BACK BY AND VERIFIED WITH: TO JGRIMSLEY(PHARD) BY TCLEVELAND 05/18/2016 AT 10:55PM    Streptococcus species NOT DETECTED NOT DETECTED Final   Streptococcus agalactiae NOT DETECTED NOT DETECTED Final   Streptococcus pneumoniae NOT DETECTED NOT DETECTED Final   Streptococcus pyogenes NOT DETECTED NOT DETECTED Final   Acinetobacter baumannii NOT DETECTED NOT DETECTED Final   Enterobacteriaceae species NOT DETECTED NOT DETECTED Final   Enterobacter cloacae complex NOT DETECTED NOT DETECTED Final   Escherichia coli NOT DETECTED NOT DETECTED Final   Klebsiella oxytoca NOT DETECTED NOT DETECTED Final   Klebsiella pneumoniae NOT DETECTED NOT DETECTED Final   Proteus species NOT DETECTED NOT DETECTED Final   Serratia marcescens NOT DETECTED NOT DETECTED Final   Carbapenem resistance NOT DETECTED NOT DETECTED Final   Haemophilus influenzae NOT DETECTED NOT DETECTED Final   Neisseria meningitidis NOT  DETECTED NOT DETECTED Final   Pseudomonas aeruginosa NOT DETECTED NOT DETECTED Final   Candida albicans NOT DETECTED NOT DETECTED Final   Candida glabrata NOT DETECTED NOT DETECTED Final   Candida krusei NOT DETECTED NOT DETECTED Final   Candida parapsilosis NOT DETECTED NOT DETECTED Final   Candida tropicalis NOT DETECTED NOT DETECTED Final    Comment: Performed at [facility]  Urine culture     Status: None (Preliminary result)   Collection Time: 05/17/16  6:50 PM  Result Value Ref Range Status   Specimen Description URINE, CLEAN CATCH  Final   Special Requests NONE  Final   Culture   Final    CULTURE REINCUBATED FOR BETTER GROWTH Performed at [facility]    Report Status PENDING  Incomplete    Michel Bickers, MD [facility] [phone] pager   [phone] cell 05/19/2016, 1:04 PM

## 2016-05-19 NOTE — Progress Notes (Signed)
Went to examine patient's RLE per Dr. Ardelia Mems. Images obtained to follow improvement/progress while on antibiotics.

## 2016-05-19 NOTE — Progress Notes (Signed)
Pharmacy Antibiotic Note  Matthew [name] is a 60 y.o. male admitted on 05/17/2016 with cellulitis of right lower extremity, complicated by transient MRSA bacteremia. Pharmacy has been consulted for Vancomycin dosing.    Day # 3 Vanc.  Trough level done at 10pm tonight is 31 mcg/ml. Above target. But, last dose was given at 3pm today, rather than 10am, so trough level is mid-interval at ~7 hr after dose. 10pm dose held.    Estimated vanc half-life is 19.8 hrs.  Scr 1.08 on admit 8/19, 1.75 today.    For TEE to rule out endocarditis.  Plan:  Change Vancomycin to 1750 mg IV q24hrs.  Next dose at 6pm on 8/22, when vanc level is estimated to be down into target trough range of 15-20 mcg/ml.  Follow renal function, culture data, TEE and progress.  Vanc trough level at steady-state.  Height: 5\' 10"  (177.8 cm) Weight: (!) 421 lb 8 oz (191.2 kg) IBW/kg (Calculated) : 73  Temp (24hrs), Avg:98.1 F (36.7 C), Min:97.6 F (36.4 C), Max:98.7 F (37.1 C)   Recent Labs Lab 05/17/16 1840  05/18/16 0428 05/18/16 0755 05/18/16 1507 05/18/16 1842 05/18/16 2207 05/19/16 0420 05/19/16 2202  WBC 23.9*  --  31.3*  --   --   --   --  22.6*  --   CREATININE 1.08  --  1.83*  --   --   --   --  1.75*  --   LATICACIDVEN  --   < > 2.2* 2.2* 2.8* 2.0* 1.8  --   --   VANCOTROUGH  --   --   --   --   --   --   --   --  31*  < > = values in this interval not displayed.  Estimated Creatinine Clearance: 77.3 mL/min (by C-G formula based on SCr of 1.75 mg/dL).    Allergies  Allergen Reactions  . Potassium-Containing Compounds Other (See Comments)    Reaction:  Stomach pain   . Doxycycline Other (See Comments)    Reaction:  Right side pain   . Augmentin [Amoxicillin-Pot Clavulanate] Nausea Only and Other (See Comments)    Has patient had a PCN reaction causing immediate rash, facial/tongue/throat swelling, SOB or lightheadedness with hypotension: No Has patient had a PCN reaction causing severe rash  involving mucus membranes or skin necrosis: No Has patient had a PCN reaction that required hospitalization No Has patient had a PCN reaction occurring within the last 10 years: Yes If all of the above answers are "NO", then may proceed with Cephalosporin use.  [name] Neomycin Rash    Antimicrobials this admission:   Vancomycin 8/19>>   Zosyn 8/19>>8/21  Dose adjustments this admission:  8/21:  Changing from Vanc 1250 mg IV q12hrs to Vanc 1750 mg IV q24hrs   Microbiology results:  8/21 blood x 2 -  8/19 blood (1845) - no growth x 1 day so far  8/19 blood (1835) - Staph aureus - sensitivities pending  8/19 urine - reincubated - pending  8/19 BCID - positive for Staph species, Staph aureus and Methicillin resistance   Arty Baumgartner, [place] Pager: [phone] 05/19/2016 10:53 PM

## 2016-05-19 NOTE — Progress Notes (Signed)
Social note: I have been functioning as Matthew [name]'s PCP for the last 4-6 months.  I made a supportive visit.   Appreciate great care by team and ID consultants.  Obviously needed hospitalization for staph sepsis.    Because I have not made substantive progress in controling excess volume, I would appreciate any modifications the team can make to his outpatient regimen.

## 2016-05-19 NOTE — Progress Notes (Signed)
Family Medicine Teaching Service Daily Progress Note Intern Pager: [phone]  Patient name: Matthew [name] Medical record number: [id number] Date of birth: November 18, 1955 Age: 60 y.o. Gender: male  Primary Care Provider: Junie Panning, DO Consultants: none Code Status: FULL  Pt Overview and Major Events to Date:  Blood cultures growing MRSA. Will d/c zosyn today.   Assessment and Plan: MRSA bacteremia, sepsis: Presented with feeling unwell and chills at home. Has been afebrile. In [facility] ED, tachypneic to 30, BP 111/73, HR 114. Lactate 3.55, WBC 23.9. Patient meeting sepsis criteria w/ suspected source of infection RLE cellulitis.Lactate decreased to 2.70with 1L bolus. Repeat lactate here 2.2>2.8>1.8. Vanc/zosyn given. UA with large leuks, no nitrites, no bacteria. CXR with vascular congestion, Small right pleural effusion noted. Borderline cardiomegaly. Increased interstitial markings may reflect mild interstitial edema. Possible alternative sources include bilateral lateral midfoot eschars, urine (less likely), SBP, orpneumonia. APTT 33. Procalcitonin 2.43. INR 1.53. WBC increased to 31.3>22.6. Has received 1L bolus x3 so far in this admission. Blood cultures growing MRSA, sensitivities pending. Repeat CXR showed stable R basilar infiltrate and associated effusion. Abdominal US showed moderate cirrhosis, no ascites.  -monitor urine cultures -continue vanc, d/c zosyn -TEE to eval for possible vegetations, will consult cards  Anasarca and alcoholic cirrhosis: Wt [id number]. Admit weight 415. Most recent weight in clinic 409.Recent weight increase in clinic led to increasing spiro and HCTZ.Also on Demadex outpatient. BNP >600 (baseline ~200). Last echo 01/2015 EF 0000000, normal systolic function, wall motion poorly visualized. Given IV lasix 60mg  once. Abdominal US moderate cirrhosis, no ascites. Echo EF 65%, mild basal septal LV hypertrophy, indeterminate diastolic function 2/2 afib.  -consider  lasix today. Wt is up to 421, but bilateral lower extremity swelling is unchanged. -Strict I/Os -Daily weights -holding home HCTZ, spiro, and torsemide -K 3.5 today, will replete with 40 Kdur  DMII: Diet controlled. Cover with SSI. Most recent A1C 6.0 in April. Received 5 units SSI over the past 24 hours. -repeat HgbA1c -continue home neurontin -sensitive sliding scale insulin -monitor CBGs  AKI: Cr baseline 1.08. Cr now 1.83>1.75.  - Feurea = 32%, likely prerenal 2/2 sepsis.  - continue to monitor  HTN: Home meds are HCTZ 12.5mg  daily.  -holding HCTZ in the setting of IV lasix use  Afib: Rate controlled on dilt, will continue.  -continue dilt w/ holding parameters SBP<110, DBP<60  OSA: Uses CPAP at home.  -continue CPAP qhs  FEN/GI: heart healthy/carb moddiet, SSI Prophylaxis: lovenox  Disposition: continued management of sepsis as inpatient  Subjective:  Pt resting comfortably this morning, anxious about TEE. Joking about seeing Dr. Andria Frames. He feels his cellulitis has improved. Had one episode overnight where he felt "bad," but this resolved after he put his CPAP back on.  Objective: Temp:  [98.4 F (36.9 C)-98.7 F (37.1 C)] 98.7 F (37.1 C) (08/21 0404) Pulse Rate:  [56-85] 82 (08/21 0404) Resp:  [20] 20 (08/21 0404) BP: (100-120)/(48-60) 102/60 (08/21 0404) SpO2:  [92 %-96 %] 92 % (08/21 0404) Weight:  [191.2 kg (421 lb 8 oz)] 191.2 kg (421 lb 8 oz) (08/21 0404) Physical Exam: General: NAD, morbidly obese male, pleasant. Eyes: EOMI, PERRLA. ENTM: mucosa pink and moist Cardiovascular: difficult to auscultate 2/2 body habitus but sounds like an irregularly irregular rhythm, no m/r/g Respiratory: CTAB, good air movement  Abdomen: large pannus w/ marked thickening of skin on the inferior aspect, SNT, umbilical hernia not reducible and nontender Extremities: 2+ BLE, skin darkening consistent with chronic venous stasis, R  leg erythematous and warm up to lateral  knee. Erythema extends about 2 inches beyond skin markings from 8/20. Nonpurlent. Neuro: grossly intact, follows commands Psych: appropriate mood and affect  Laboratory:  Recent Labs Lab 05/17/16 1840 05/18/16 0428 05/19/16 0420  WBC 23.9* 31.3* 22.6*  HGB 17.6* 15.7 14.1  HCT 50.3 45.8 42.1  PLT 465* 434* 423*    Recent Labs Lab 05/17/16 1840 05/18/16 0428 05/19/16 0420  NA 135 134* 134*  K 3.6 3.8 3.5  CL 91* 92* 97*  CO2 33* 31 29  BUN 18 23* 30*  CREATININE 1.08 1.83* 1.75*  CALCIUM 9.6 9.5 8.4*  PROT 8.7* 7.9  --   BILITOT 1.6* 2.1*  --   ALKPHOS 184* 174*  --   ALT 24 21  --   AST 40 41  --   GLUCOSE 172* 144* 97    Imaging/Diagnostic Tests: Dg Chest 2 View  Result Date: 05/18/2016 CLINICAL DATA:  Sepsis EXAM: CHEST  2 VIEW COMPARISON:  05/18/2016 FINDINGS: Cardiac shadow is stable. The right lung again demonstrates basilar infiltrate with associated small effusion. This is stable from the prior exam. No new focal abnormality is seen. Bony structures are within normal limits. IMPRESSION: Stable right basilar infiltrate and associated effusion. Electronically Signed   By: Inez Catalina M.D.   On: 05/18/2016 19:28   US Abdomen Complete  Result Date: 05/18/2016 CLINICAL DATA:  Cirrhosis. Status post splenectomy. Morbid obesity. EXAM: ABDOMEN ULTRASOUND COMPLETE COMPARISON:  05/10/2015 ultrasound. FINDINGS: Gallbladder: Poorly visualized due to overlying bowel gas. No gross abnormality identified. Common bile duct: Diameter: Mildly dilated for age, 9 mm. No intrahepatic duct dilatation. Liver: Moderate cirrhosis. Increased echogenicity likely related to steatosis. IVC: No abnormality visualized. Pancreas: Poorly visualized due to overlying bowel gas. Spleen: Surgically absent Right Kidney: Length: 15.0 cm. Echogenicity within normal limits. No mass or hydronephrosis visualized. Left Kidney: Length: 12.8 cm. Echogenicity within normal limits. No mass or hydronephrosis  visualized. Abdominal aorta: No aneurysm visualized. Other findings: No ascites. Exam degraded by patient body habitus and overlying bowel gas. IMPRESSION: 1. Decreased sensitivity and specificity exam due to technique related factors, as described above. 2. Cirrhosis and probable steatosis without focal liver lesion. 3. Common duct dilatation for age. No intrahepatic ductal dilatation identified. Consider correlation with bilirubin level. Electronically Signed   By: Abigail Miyamoto M.D.   On: 05/18/2016 14:27   Dg Chest Port 1 View  Result Date: 05/18/2016 CLINICAL DATA:  Fever and chills with concern for sepsis EXAM: PORTABLE CHEST 1 VIEW COMPARISON:  May 17, 2016 FINDINGS: There is persistent consolidation in the right base with small right effusion. Left lung remains clear. Heart is borderline enlarged with pulmonary vascularity within normal limits, stable. No adenopathy. No bone lesions. IMPRESSION: Stable right base consolidation with small right effusion. No new opacity. Stable cardiac silhouette. Electronically Signed   By: Lowella Grip III M.D.   On: 05/18/2016 12:03   Dg Chest Port 1 View  Result Date: 05/17/2016 CLINICAL DATA:  Acute onset of severe chills and fever. Initial encounter. EXAM: PORTABLE CHEST 1 VIEW COMPARISON:  Chest radiograph performed 12/13/2015 FINDINGS: The lungs are well-aerated. A small right pleural effusion is noted. Vascular congestion is noted. Increased interstitial markings may reflect mild interstitial edema. Pneumonia could have a similar appearance, given the patient's symptoms. No pneumothorax is seen. The cardiomediastinal silhouette is borderline enlarged. No acute osseous abnormalities are seen. IMPRESSION: Small right pleural effusion noted. Vascular congestion and borderline cardiomegaly. Increased  interstitial markings may reflect mild interstitial edema. Pneumonia could have a similar appearance, given the patient's symptoms. Electronically Signed   By:  Garald Balding M.D.   On: 05/17/2016 19:52   Sela Hilding, MD 05/19/2016, 6:24 AM PGY-1, [place] Intern pager: [phone], text pages welcome

## 2016-05-20 DIAGNOSIS — R600 Localized edema: Secondary | ICD-10-CM

## 2016-05-20 LAB — CBC
HEMATOCRIT: 47.4 % (ref 39.0–52.0)
Hemoglobin: 15.9 g/dL (ref 13.0–17.0)
MCH: 32.6 pg (ref 26.0–34.0)
MCHC: 33.5 g/dL (ref 30.0–36.0)
MCV: 97.3 fL (ref 78.0–100.0)
PLATELETS: 408 10*3/uL — AB (ref 150–400)
RBC: 4.87 MIL/uL (ref 4.22–5.81)
RDW: 16.2 % — AB (ref 11.5–15.5)
WBC: 15.3 10*3/uL — AB (ref 4.0–10.5)

## 2016-05-20 LAB — BASIC METABOLIC PANEL
ANION GAP: 9 (ref 5–15)
BUN: 32 mg/dL — ABNORMAL HIGH (ref 6–20)
CALCIUM: 8.9 mg/dL (ref 8.9–10.3)
CHLORIDE: 95 mmol/L — AB (ref 101–111)
CO2: 31 mmol/L (ref 22–32)
CREATININE: 1.74 mg/dL — AB (ref 0.61–1.24)
GFR calc non Af Amer: 41 mL/min — ABNORMAL LOW (ref >60)
GFR, EST AFRICAN AMERICAN: 48 mL/min — AB (ref >60)
Glucose, Bld: 85 mg/dL (ref 65–99)
Potassium: 4.1 mmol/L (ref 3.5–5.1)
SODIUM: 135 mmol/L (ref 135–145)

## 2016-05-20 LAB — CULTURE, BLOOD (ROUTINE X 2)

## 2016-05-20 LAB — GLUCOSE, CAPILLARY
GLUCOSE-CAPILLARY: 105 mg/dL — AB (ref 65–99)
GLUCOSE-CAPILLARY: 86 mg/dL (ref 65–99)
GLUCOSE-CAPILLARY: 313 mg/dL — AB (ref 65–99)
Glucose-Capillary: 118 mg/dL — ABNORMAL HIGH (ref 65–99)

## 2016-05-20 MED ORDER — SODIUM CHLORIDE 0.9 % IV SOLN
INTRAVENOUS | Status: DC
  Administered 2016-05-20 – 2016-05-21 (×2): via INTRAVENOUS

## 2016-05-20 NOTE — Progress Notes (Signed)
Family Medicine Teaching Service Daily Progress Note Intern Pager: [phone]  Patient name: LINKIN GERGES Medical record number: [id number] Date of birth: Feb 26, 1956 Age: 60 y.o. Gender: male  Primary Care Provider: Junie Panning, DO Consultants: ID Code Status: FULL  Pt Overview and Major Events to Date:  No acute events overnight. Erythema stable from yesterday.  Assessment and Plan: MRSA bacteremia, sepsis: Presented with feeling unwell and chills at home. Has been afebrile. In [facility] ED, tachypneic to 30, BP 111/73, HR 114. Lactate 3.55, WBC 23.9. Patient meeting sepsis criteria w/ suspected source of infection RLE cellulitis.Lactate decreased to 2.70with 1L bolus. Repeat lactate here 2.2>2.8>1.8. Vanc/zosyn given. UA with large leuks, no nitrites, no bacteria. CXR with vascular congestion, Small right pleural effusion noted. Borderline cardiomegaly. Increased interstitial markings may reflect mild interstitial edema. Most likely source of bacteremia is RLE cellulitis. Possible alternative sources include bilateral lateral midfoot eschars, urine (less likely), SBP, orpneumonia. APTT 33. Procalcitonin 2.43. INR 1.53. WBC increased to 31.3>22.6>15.3. Blood cultures growing MRSA. Repeat blood cultures pending. Repeat CXR showed stable R basilar infiltrate and associated effusion. Abdominal US showed moderate cirrhosis, no ascites.  -monitor urine cultures > reincubated for better growth -continue vanc -ID automatic consult for MRSA bactermia: ordered repeat BCx, do not place PICC until those are negative. Agree with TEE. -TEE to eval for possible vegetations, will consult cards -erythema stable from yesterday, no warmth today. Cellulitis extending beyond skin markings late afternoon 8/21, could consider imaging RLE for extension of infection if continued worsening, not indicated at this time.  Anasarca and alcoholic cirrhosis: Wt 0000000 from 421. Admit weight 415. Most recent weight in  clinic 409.Recent weight increase in clinic led to increasing spiro and HCTZ.Also on Demadex outpatient. BNP >600 (baseline ~200). Last echo 01/2015 EF 0000000, normal systolic function, wall motion poorly visualized. Given IV lasix 60mg  once. Abdominal US moderate cirrhosis, no ascites. Echo EF 65%, mild basal septal LV hypertrophy, indeterminate diastolic function 2/2 afib.  -consider lasix 60mg  IV once today. Wt is up to 425, but bilateral lower extremity swelling is unchanged. -Strict I/Os -Daily weights -holding home HCTZ, spiro, and torsemide  DMII: Diet controlled. Cover with SSI. Most recent A1C 6.0 in April. Received 3 units SSI over the past 24 hours. -repeat HgbA1c -continue home neurontin -sensitive sliding scale insulin -monitor CBGs  AKI: Cr baseline 1.08. Cr now 1.83>1.75>1.74.  - Feurea = 32%, likely prerenal 2/2 sepsis.  - continue to monitor  HTN: Home meds are HCTZ 12.5mg  daily.  -holding HCTZ in the setting of IV lasix use  Afib: Rate controlled on dilt, will continue.  -continue dilt w/ holding parameters SBP<110, DBP<60  OSA: Uses CPAP at home.  -continue CPAP qhs  FEN/GI: heart healthy/carb moddiet, SSI Prophylaxis: lovenox  Disposition: continued management of sepsis as inpatient  Subjective:  Pt resting comfortably. Inquisitive about contact gowns, continues to be anxious about TEE. Happy to have seen Dr. Andria Frames yesterday. Worried that we are not adequately treating his MRSA bacteremia because vanc isn't hanging around the clock. Explained levels and spaced dosing per pharmacy.  Objective: Temp:  [97.5 F (36.4 C)-97.7 F (36.5 C)] 97.5 F (36.4 C) (08/22 0615) Pulse Rate:  [65-74] 71 (08/22 0615) Resp:  [20] 20 (08/22 0615) BP: (109-117)/(50-68) 109/52 (08/22 0615) SpO2:  [92 %-96 %] 93 % (08/22 0615) Weight:  [193.1 kg (425 lb 11.2 oz)] 193.1 kg (425 lb 11.2 oz) (08/22 0615) Physical Exam: General: NAD, morbidly obese male,  pleasant. ENTM: mucosa  pink and moist Cardiovascular:difficult to auscultate 2/2 body habitus but sounds like anirregularly irregular rhythm, no m/r/g Respiratory: CTAB, good air movement  Abdomen: large pannus w/ marked thickening of skin on the inferior aspect, SNT, umbilical hernia not reducible and nontender Extremities: 2+ BLE, skin darkening consistent with chronic venous stasis, R leg erythematous and warm up to lateral knee. Erythema extends about 2 inches beyond skin markings from 8/20. Nonpurlent. See clinical images.      Neuro: grossly intact, follows commands Psych: appropriate mood and affect  Laboratory:  Recent Labs Lab 05/17/16 1840 05/18/16 0428 05/19/16 0420  WBC 23.9* 31.3* 22.6*  HGB 17.6* 15.7 14.1  HCT 50.3 45.8 42.1  PLT 465* 434* 423*    Recent Labs Lab 05/17/16 1840 05/18/16 0428 05/19/16 0420 05/20/16 0337  NA 135 134* 134* 135  K 3.6 3.8 3.5 4.1  CL 91* 92* 97* 95*  CO2 33* 31 29 31   BUN 18 23* 30* 32*  CREATININE 1.08 1.83* 1.75* 1.74*  CALCIUM 9.6 9.5 8.4* 8.9  PROT 8.7* 7.9  --   --   BILITOT 1.6* 2.1*  --   --   ALKPHOS 184* 174*  --   --   ALT 24 21  --   --   AST 40 41  --   --   GLUCOSE 172* 144* 97 85    Sela Hilding, MD 05/20/2016, 7:19 AM PGY-1, [place] Intern pager: [phone], text pages welcome

## 2016-05-20 NOTE — Progress Notes (Signed)
PT Cancellation Note  Patient Details Name: Matthew [name] MRN: [id number] DOB: 1956-05-16   Cancelled Treatment:    Reason Eval/Treat Not Completed: Patient at procedure or test/unavailable. Patient initiating bath with nursing assist. Will return today for evaluation.   Shanyah Gattuso 05/20/2016, 11:09 AM Pager [phone]

## 2016-05-20 NOTE — Evaluation (Signed)
Physical Therapy Evaluation and Discharge  Patient Details Name: Matthew [name] MRN: [id number] DOB: 16-Oct-1955 Today's Date: 05/20/2016   History of Present Illness  60 year old male presenting with sudden onset of chills day of admission. Known morbid obesity, type 2 diabetes, alcohol abuse, alcoholic cirrhosis. Presented emergency department. Found to have right lower extremity cellulitis. +fluid overload    Clinical Impression  Patient evaluated by Physical Therapy with no further PT needs identified. All education has been completed and the patient has no further questions for PT. Patient is interested in owning a cane to increase his safety. Explained Medicare will only pay for one "locomotion" device every 5 years and recommend he buys a straight cane (NOT quad cane) out-of-pocket. He is interested in tub bench (also informed insurance will not cover this) and reacher. He is interested in Occupational Therapy consult to address his ADL and long-handled equipment needs. PT is signing off. Thank you for this referral.     Follow Up Recommendations No PT follow up    Equipment Recommendations  interested in tub bench (not covered by insurance), cane, reacher   Recommendations for Other Services OT consult     Precautions / Restrictions Precautions Precautions: Fall      Mobility  Bed Mobility Overal bed mobility: Modified Independent             General bed mobility comments: heavy use of rail  Transfers Overall transfer level: Modified independent Equipment used: None             General transfer comment: sit to stand from bed and then chair without arms  Ambulation/Gait Ambulation/Gait assistance: Modified independent (Device/Increase time) Ambulation Distance (Feet): 10 Feet ( seated rest, 10) Assistive device: None Gait Pattern/deviations: Step-through pattern;Decreased stride length;Wide base of support;Shuffle     General Gait Details: very short,  shuffling steps (pt very cautious and becomes SOB)  Stairs            Wheelchair Mobility    Modified Rankin (Stroke Patients Only)       Balance Overall balance assessment: Modified Independent                                           Pertinent Vitals/Pain Pain Assessment: Faces Faces Pain Scale: Hurts little more Pain Location: RLE Pain Descriptors / Indicators: Tightness    Home Living Family/patient expects to be discharged to:: Private residence Living Arrangements: Alone   Type of Home: Apartment Home Access: Stairs to enter Entrance Stairs-Rails: None Entrance Stairs-Number of Steps: 1 Home Layout: One level Home Equipment: Walker - 4 wheels;Grab bars - tub/shower Additional Comments: would like tub bench, cane, reacher    Prior Function Level of Independence: Needs assistance   Gait / Transfers Assistance Needed: walks independently; denies fallls; very cautious and feels a cane might be helpful  ADL's / Homemaking Assistance Needed: needs assist to clean his tub/shower        Hand Dominance        Extremity/Trunk Assessment   Upper Extremity Assessment: Overall WFL for tasks assessed           Lower Extremity Assessment: RLE deficits/detail (LLE WFL) RLE Deficits / Details: incr edema and cellulitis    Cervical / Trunk Assessment: Other exceptions  Communication   Communication: No difficulties  Cognition Arousal/Alertness: Awake/alert Behavior During Therapy: WFL for tasks assessed/performed Overall Cognitive  Status: Within Functional Limits for tasks assessed                      General Comments      Exercises        Assessment/Plan    PT Assessment Patent does not need any further PT services  PT Diagnosis     PT Problem List    PT Treatment Interventions     PT Goals (Current goals can be found in the Care Plan section) Acute Rehab PT Goals Patient Stated Goal: be more safe with  activities where he bends/leans forward (i.e. bathing, dressing) PT Goal Formulation: All assessment and education complete, DC therapy    Frequency     Barriers to discharge        Co-evaluation               End of Session   Activity Tolerance: Treatment limited secondary to medical complications (Comment) (dyspnea) Patient left: Other (comment) (in bathroom; reports he has been up in room independently)           Time: [id number] PT Time Calculation (min) (ACUTE ONLY): 40 min   Charges:   PT Evaluation $PT Eval Low Complexity: 1 Procedure PT Treatments $Gait Training: 8-22 mins $Therapeutic Activity: 8-22 mins   PT G Codes:        Matthew [name] 16-Jun-2016, 4:07 PM  Pager [phone]

## 2016-05-20 NOTE — Progress Notes (Signed)
    Patient scheduled for TEE 05/21/16 for bacteremia. I reviewed indication as well as procedural details including potential risk. Patient in agreement to proceed. Orders have been placed. NPO at midnight. RN to obtain written consent.   Brittainy Rosita Fire 05/20/2016

## 2016-05-21 ENCOUNTER — Inpatient Hospital Stay (HOSPITAL_COMMUNITY): Payer: Medicare Other

## 2016-05-21 ENCOUNTER — Inpatient Hospital Stay (HOSPITAL_COMMUNITY): Payer: Medicare Other | Admitting: Anesthesiology

## 2016-05-21 ENCOUNTER — Ambulatory Visit: Payer: Self-pay | Admitting: Family Medicine

## 2016-05-21 ENCOUNTER — Encounter (HOSPITAL_COMMUNITY): Payer: Self-pay | Admitting: *Deleted

## 2016-05-21 ENCOUNTER — Encounter (HOSPITAL_COMMUNITY)
Admission: EM | Disposition: A | Discharge status: Home Health | Payer: Self-pay | Source: Home / Self Care | Attending: Family Medicine

## 2016-05-21 ENCOUNTER — Ambulatory Visit: Payer: Self-pay | Admitting: Psychology

## 2016-05-21 DIAGNOSIS — I517 Cardiomegaly: Secondary | ICD-10-CM

## 2016-05-21 DIAGNOSIS — R7881 Bacteremia: Secondary | ICD-10-CM

## 2016-05-21 DIAGNOSIS — I10 Essential (primary) hypertension: Secondary | ICD-10-CM

## 2016-05-21 HISTORY — PX: TEE WITHOUT CARDIOVERSION: SHX5443

## 2016-05-21 LAB — BLOOD CULTURE ID PANEL (REFLEXED)
Acinetobacter baumannii: NOT DETECTED
CANDIDA KRUSEI: NOT DETECTED
CANDIDA PARAPSILOSIS: NOT DETECTED
CARBAPENEM RESISTANCE: NOT DETECTED
Candida albicans: NOT DETECTED
Candida glabrata: NOT DETECTED
Candida tropicalis: NOT DETECTED
ENTEROBACTERIACEAE SPECIES: NOT DETECTED
Enterobacter cloacae complex: NOT DETECTED
Enterococcus species: NOT DETECTED
Escherichia coli: NOT DETECTED
Haemophilus influenzae: NOT DETECTED
KLEBSIELLA OXYTOCA: NOT DETECTED
KLEBSIELLA PNEUMONIAE: NOT DETECTED
Listeria monocytogenes: NOT DETECTED
Methicillin resistance: NOT DETECTED
Neisseria meningitidis: NOT DETECTED
PSEUDOMONAS AERUGINOSA: NOT DETECTED
Proteus species: NOT DETECTED
SERRATIA MARCESCENS: NOT DETECTED
STAPHYLOCOCCUS SPECIES: NOT DETECTED
STREPTOCOCCUS AGALACTIAE: NOT DETECTED
Staphylococcus aureus (BCID): NOT DETECTED
Streptococcus pneumoniae: NOT DETECTED
Streptococcus pyogenes: NOT DETECTED
Streptococcus species: NOT DETECTED
Vancomycin resistance: NOT DETECTED

## 2016-05-21 LAB — CBC
HCT: 45.7 % (ref 39.0–52.0)
Hemoglobin: 15.2 g/dL (ref 13.0–17.0)
MCH: 31.9 pg (ref 26.0–34.0)
MCHC: 33.3 g/dL (ref 30.0–36.0)
MCV: 95.8 fL (ref 78.0–100.0)
PLATELETS: 406 10*3/uL — AB (ref 150–400)
RBC: 4.77 MIL/uL (ref 4.22–5.81)
RDW: 16.3 % — AB (ref 11.5–15.5)
WBC: 15.3 10*3/uL — ABNORMAL HIGH (ref 4.0–10.5)

## 2016-05-21 LAB — PROTIME-INR
INR: 1.32
PROTHROMBIN TIME: 16.5 s — AB (ref 11.4–15.2)

## 2016-05-21 LAB — GLUCOSE, CAPILLARY
GLUCOSE-CAPILLARY: 128 mg/dL — AB (ref 65–99)
GLUCOSE-CAPILLARY: 176 mg/dL — AB (ref 65–99)
GLUCOSE-CAPILLARY: 160 mg/dL — AB (ref 65–99)
Glucose-Capillary: 102 mg/dL — ABNORMAL HIGH (ref 65–99)
Glucose-Capillary: 115 mg/dL — ABNORMAL HIGH (ref 65–99)

## 2016-05-21 LAB — BASIC METABOLIC PANEL
Anion gap: 9 (ref 5–15)
BUN: 30 mg/dL — AB (ref 6–20)
CALCIUM: 8.8 mg/dL — AB (ref 8.9–10.3)
CO2: 25 mmol/L (ref 22–32)
CREATININE: 1.56 mg/dL — AB (ref 0.61–1.24)
Chloride: 101 mmol/L (ref 101–111)
GFR calc Af Amer: 54 mL/min — ABNORMAL LOW (ref >60)
GFR, EST NON AFRICAN AMERICAN: 47 mL/min — AB (ref >60)
GLUCOSE: 65 mg/dL (ref 65–99)
Potassium: 5.1 mmol/L (ref 3.5–5.1)
Sodium: 135 mmol/L (ref 135–145)

## 2016-05-21 SURGERY — ECHOCARDIOGRAM, TRANSESOPHAGEAL
Anesthesia: General

## 2016-05-21 MED ORDER — LACTATED RINGERS IV SOLN: INTRAVENOUS | Status: DC

## 2016-05-21 MED ORDER — DEXTROSE 5 % IV SOLN
2.0000 g | Freq: Three times a day (TID) | INTRAVENOUS | Status: DC
  Administered 2016-05-21 – 2016-05-23 (×7): 2 g via INTRAVENOUS
  Filled 2016-05-21 (×10): qty 2

## 2016-05-21 MED ORDER — PROMETHAZINE HCL 25 MG/ML IJ SOLN: 6.2500 mg | INTRAMUSCULAR | Status: DC | PRN

## 2016-05-21 MED ORDER — PROPOFOL 10 MG/ML IV BOLUS
INTRAVENOUS | Status: DC | PRN
  Administered 2016-05-21: 150 mg via INTRAVENOUS

## 2016-05-21 MED ORDER — MEPERIDINE HCL 25 MG/ML IJ SOLN
6.2500 mg | INTRAMUSCULAR | Status: DC | PRN
Start: 2016-05-21 — End: 2016-05-27

## 2016-05-21 MED ORDER — SUCCINYLCHOLINE CHLORIDE 200 MG/10ML IV SOSY
PREFILLED_SYRINGE | INTRAVENOUS | Status: DC | PRN
Start: 2016-05-21 — End: 2016-05-21
  Administered 2016-05-21: 140 mg via INTRAVENOUS

## 2016-05-21 MED ORDER — LIDOCAINE HCL (CARDIAC) 20 MG/ML IV SOLN
INTRAVENOUS | Status: DC | PRN
  Administered 2016-05-21: 60 mg via INTRAVENOUS

## 2016-05-21 MED ORDER — ONDANSETRON HCL 4 MG/2ML IJ SOLN
INTRAMUSCULAR | Status: DC | PRN
  Administered 2016-05-21: 4 mg via INTRAVENOUS

## 2016-05-21 NOTE — Progress Notes (Signed)
Pharmacy Antibiotic Note  Matthew [name] is a 60 y.o. male admitted on 05/17/2016 with MRSA bacteremia.  Pharmacy already dosing Vancomycin (Day #5). Pt now with blood culture growing GNR in 1/2. BCID negative for all organisms it tested. Pharmacy has been consulted for Cefepime dosing.  Plan: Continue Vanc 1750mg  IV q24h Cefepime 2gm IV q8h Will f/u renal function, micro data, and clinical progress F/u Vanc trough at Css  Height: 5\' 10"  (177.8 cm) Weight: (!) 425 lb 11.2 oz (193.1 kg) (scale a) IBW/kg (Calculated) : 73  Temp (24hrs), Avg:97.8 F (36.6 C), Min:97.5 F (36.4 C), Max:98.1 F (36.7 C)   Recent Labs Lab 05/17/16 1840  05/18/16 0428 05/18/16 0755 05/18/16 1507 05/18/16 1842 05/18/16 2207 05/19/16 0420 05/19/16 2202 05/20/16 0337 05/20/16 0921  WBC 23.9*  --  31.3*  --   --   --   --  22.6*  --   --  15.3*  CREATININE 1.08  --  1.83*  --   --   --   --  1.75*  --  1.74*  --   LATICACIDVEN  --   < > 2.2* 2.2* 2.8* 2.0* 1.8  --   --   --   --   VANCOTROUGH  --   --   --   --   --   --   --   --  31*  --   --   < > = values in this interval not displayed.  Estimated Creatinine Clearance: 78.2 mL/min (by C-G formula based on SCr of 1.74 mg/dL).    Allergies  Allergen Reactions  . Potassium-Containing Compounds Other (See Comments)    Reaction:  Stomach pain   . Doxycycline Other (See Comments)    Reaction:  Right side pain   . Augmentin [Amoxicillin-Pot Clavulanate] Nausea Only and Other (See Comments)    Has patient had a PCN reaction causing immediate rash, facial/tongue/throat swelling, SOB or lightheadedness with hypotension: No Has patient had a PCN reaction causing severe rash involving mucus membranes or skin necrosis: No Has patient had a PCN reaction that required hospitalization No Has patient had a PCN reaction occurring within the last 10 years: Yes If all of the above answers are "NO", then may proceed with Cephalosporin use.  [name] Neomycin Rash     Antimicrobials this admission: 8/19 Zosyn >> 8/21 8/19 Vanc >> 8/23 Cefepime >>  Dose adjustments this admission: 8/21 VT = 31 on 1250mg  q12h. 1000 dose not given until 1500 - level drawn 7hr later. Est. half-life 19.8 hrs, Ke 0.035, Vd 133.84L ->0.7 L/kg. Change to 1750 q24h  Microbiology results: 8/21 BCx x2: 1/2 GNR on gram stain; BCID neg for organisms 8/19 BCx x2: 1/2 MRSA - cipro-S, gent-S, rifa-S, tetra-S, bactrim-S 8/19 UCx: MRSA - cipro-S, nitro-S, bactrim-S, gent-S  Thank you for allowing pharmacy to be a part of this patient's care.  Sindy Guadeloupe 05/21/2016 2:59 AM

## 2016-05-21 NOTE — Progress Notes (Signed)
Family Medicine Teaching Service Daily Progress Note Intern Pager: [phone]  Patient name: Matthew [name] Medical record number: [id number] Date of birth: 09-22-1956 Age: 60 y.o. Gender: male  Primary Care Provider: Junie Panning, DO Consultants: ID Code Status: FULL  Pt Overview and Major Events to Date:  1 of 3 repeat blood cultures grew gram negative rods. Speciation to follow. Broadened to cefepime.   Assessment and Plan: MRSA bacteremia, sepsis: Presented with feeling unwell and chills at home. Has been afebrile. In [facility] ED, tachypneic to 30, BP 111/73, HR 114. Lactate 3.55, WBC 23.9. Patient meeting sepsis criteria w/ suspected source of infection RLE cellulitis.Lactate decreased to 2.70with 1L bolus. Repeat lactate here 2.2>2.8>1.8. Vanc/zosyn given. UA with large leuks, no nitrites, no bacteria. CXR with vascular congestion, Small right pleural effusion noted. Borderline cardiomegaly. Increased interstitial markings may reflect mild interstitial edema. Most likely source of bacteremia is RLE cellulitis. Possible alternative sources include bilateral lateral midfoot eschars, urine (less likely), SBP, orpneumonia. APTT 33. Procalcitonin 2.43. INR 1.53. WBC increased to 31.3>22.6>15.3>15.3. Blood cultures growing MRSA. Repeat blood cultures pending. Repeat CXR showed stable R basilar infiltrate and associated effusion. Abdominal US showed moderate cirrhosis, no ascites.  -monitor urine cultures > MRSA -continue vanc -Repeat BCx grew GNR in one of 3 bottles, broadened to cefepime  -ID automatic consult for MRSA bactermia: ordered repeat BCx, do not place PICC until those are negative. Agree with TEE.  Continued recs appreciated. -TEE to eval for possible vegetations, scheduled today -erythema stable from yesterday. Cellulitis extending beyond skin markings late afternoon 8/21, could consider imaging RLE for extension of infection if continued worsening, not indicated at this  time.  Anasarca and alcoholic cirrhosis: Wt [id number]. Admit weight 415. Most recent weight in clinic 409.Recent weight increase in clinic led to increasing spiro and HCTZ.Also on Demadex outpatient. BNP >600 (baseline ~200). Last echo 01/2015 EF 0000000, normal systolic function, wall motion poorly visualized. Given IV lasix 60mg  once. Abdominal US moderate cirrhosis, no ascites. Echo EF 65%, mild basal septal LV hypertrophy, indeterminate diastolic function 2/2 afib.  -consider lasix 60mg  IV once today. Wt is up to 429, but bilateral lower extremity swelling is unchanged. -Strict I/Os -Daily weights -holding home HCTZ, spiro, and torsemide  DMII: Diet controlled. Cover with SSI. Most recent A1C 6.0 in April. Received 4 units SSI over the past 24 hours. -repeat HgbA1c on admission = 6.9. Consider metformin as outpatient vs dietary changes. -continue home neurontin -sensitive sliding scale insulin -monitor CBGs  AKI: Cr baseline 1.08. Cr now 1.83>1.75>1.74.  - Feurea = 32%, likely prerenal 2/2 sepsis.  - continue to monitor  HTN: Home meds are HCTZ 12.5mg  daily.  -holding HCTZ in the setting of IV lasix use  Afib: Rate controlled on dilt, will continue.  -continue dilt w/ holding parameters SBP<110, DBP<60  OSA: Uses CPAP at home.  -continue CPAP qhs  FEN/GI: heart healthy/carb moddiet, SSI Prophylaxis: lovenox  Disposition: continued management of sepsis as inpatient  Disposition: continued management of bacteremia  Subjective:  Pt sleeping but arousable, no pain, no complaints.  Objective: Temp:  [97.7 F (36.5 C)-98.1 F (36.7 C)] 97.7 F (36.5 C) (08/23 0432) Pulse Rate:  [75-91] 91 (08/23 0432) Resp:  [18-20] 18 (08/23 0432) BP: (103-142)/(50-71) 142/71 (08/23 0432) SpO2:  [92 %-95 %] 92 % (08/23 0432) Weight:  [194.7 kg (429 lb 3.2 oz)] 194.7 kg (429 lb 3.2 oz) (08/23 0432) Physical Exam: General: NAD, morbidly obese male, pleasant. ENTM: mucosa  pink and  moist Cardiovascular:difficult to auscultate 2/2 body habitus but sounds like anirregularly irregular rhythm, no m/r/g Respiratory: CTAB, good air movement  Abdomen: large pannus w/ marked thickening of skin on the inferior aspect, SNT, umbilical hernia not reducible and nontender Extremities: 2+ BLE, skin darkening consistent with chronic venous stasis, R leg erythematous and warm up to lateral knee. Erythema extends about 2 inches beyond skin markings from 8/20. Nonpurlent.   Laboratory:  Recent Labs Lab 05/19/16 0420 05/20/16 0921 05/21/16 0311  WBC 22.6* 15.3* 15.3*  HGB 14.1 15.9 15.2  HCT 42.1 47.4 45.7  PLT 423* 408* 406*    Recent Labs Lab 05/17/16 1840 05/18/16 0428 05/19/16 0420 05/20/16 0337 05/21/16 0311  NA 135 134* 134* 135 135  K 3.6 3.8 3.5 4.1 5.1  CL 91* 92* 97* 95* 101  CO2 33* 31 29 31 25   BUN 18 23* 30* 32* 30*  CREATININE 1.08 1.83* 1.75* 1.74* 1.56*  CALCIUM 9.6 9.5 8.4* 8.9 8.8*  PROT 8.7* 7.9  --   --   --   BILITOT 1.6* 2.1*  --   --   --   ALKPHOS 184* 174*  --   --   --   ALT 24 21  --   --   --   AST 40 41  --   --   --   GLUCOSE 172* 144* 97 85 65    Sela Hilding, MD 05/21/2016, 6:51 AM PGY-1, [place] Intern pager: [phone], text pages welcome

## 2016-05-21 NOTE — Anesthesia Preprocedure Evaluation (Addendum)
Anesthesia Evaluation  Patient identified by MRN, date of birth, ID band Patient awake    Reviewed: Allergy & Precautions, NPO status , Patient's Chart, lab work & pertinent test results  Airway Mallampati: II  TM Distance: >3 FB Neck ROM: Full    Dental  (+) Dental Advisory Given, Poor Dentition, Missing   Pulmonary shortness of breath, sleep apnea and Continuous Positive Airway Pressure Ventilation , former smoker,    breath sounds clear to auscultation       Cardiovascular hypertension, +CHF  + dysrhythmias Atrial Fibrillation  Rhythm:Regular Rate:Normal     Neuro/Psych PSYCHIATRIC DISORDERS Depression negative neurological ROS     GI/Hepatic Neg liver ROS, GERD  Medicated,  Endo/Other  diabetes, Type 2Morbid obesity  Renal/GU Renal disease  negative genitourinary   Musculoskeletal negative musculoskeletal ROS (+)   Abdominal (+) + obese,   Peds negative pediatric ROS (+)  Hematology negative hematology ROS (+)   Anesthesia Other Findings   Reproductive/Obstetrics negative OB ROS                           Lab Results  Component Value Date   WBC 15.3 (H) 05/21/2016   HGB 15.2 05/21/2016   HCT 45.7 05/21/2016   MCV 95.8 05/21/2016   PLT 406 (H) 05/21/2016   Lab Results  Component Value Date   CREATININE 1.56 (H) 05/21/2016   BUN 30 (H) 05/21/2016   NA 135 05/21/2016   K 5.1 05/21/2016   CL 101 05/21/2016   CO2 25 05/21/2016   Lab Results  Component Value Date   INR 1.32 05/21/2016   INR 1.53 05/18/2016   INR 1.28 02/28/2016   04/2016 EKG: atrial fibrillation.  04/2016 Echo - Left ventricle: The cavity size was normal. There was mild focal   basal hypertrophy of the septum. Systolic function was vigorous.   The estimated ejection fraction was in the range of 65% to 70%.   Wall motion was normal; there were no regional wall motion   abnormalities. The study was not  technically sufficient to allow   evaluation of LV diastolic dysfunction due to atrial   fibrillation. - Mitral valve: Calcified annulus. There was trivial regurgitation. - Left atrium: The atrium was at the upper limits of normal in   size. - Right atrium: The atrium was mildly dilated. - Tricuspid valve: There was trivial regurgitation. - Pulmonary arteries: Systolic pressure could not be accurately   estimated. - Pericardium, extracardiac: There was no pericardial effusion.  Anesthesia Physical Anesthesia Plan  ASA: III  Anesthesia Plan: General   Post-op Pain Management:    Induction: Intravenous, Rapid sequence and Cricoid pressure planned  Airway Management Planned: Oral ETT and Video Laryngoscope Planned  Additional Equipment:   Intra-op Plan: Utilization Of Total Body Hypothermia per surgeon request  Post-operative Plan: Possible Post-op intubation/ventilation  Informed Consent: I have reviewed the patients History and Physical, chart, labs and discussed the procedure including the risks, benefits and alternatives for the proposed anesthesia with the patient or authorized representative who has indicated his/her understanding and acceptance.   Dental advisory given  Plan Discussed with: CRNA, Anesthesiologist and Surgeon  Anesthesia Plan Comments:       Anesthesia Quick Evaluation

## 2016-05-21 NOTE — Anesthesia Postprocedure Evaluation (Signed)
Anesthesia Post Note  Patient: DESTRY DAWN  Procedure(s) Performed: Procedure(s) (LRB): TRANSESOPHAGEAL ECHOCARDIOGRAM (TEE) (N/A)  Patient location during evaluation: PACU Anesthesia Type: General Level of consciousness: awake and alert Pain management: pain level controlled Vital Signs Assessment: post-procedure vital signs reviewed and stable Respiratory status: spontaneous breathing, nonlabored ventilation, respiratory function stable and patient connected to nasal cannula oxygen Cardiovascular status: blood pressure returned to baseline and stable Postop Assessment: no signs of nausea or vomiting Anesthetic complications: no Comments: Current on BIPAP and weaning. Tolerating well.     Last Vitals:  Vitals:   05/21/16 1335 05/21/16 1339  BP: (!) 180/72   Pulse: (!) 105 95  Resp: (!) 25 15  Temp: 36.7 C     Last Pain:  Vitals:   05/21/16 1335  TempSrc:   PainSc: 0-No pain                 Effie Berkshire

## 2016-05-21 NOTE — CV Procedure (Signed)
Procedure: TEE  Sedation: General, per anesthesiology.  Indication: Bacteremia, assess for endocarditis.   Findings: Please see echo section for full report.  Normal LV size with moderate LV hypertrophy.  EF 60-65%.  Normal wall motion.  Normal right ventricular size and systolic function.  Moderate left atrial enlargement with no LA appendage thrombus.  Moderate right atrial enlargement.  Trivial TR, no vegetation.  Trivial MR, no vegetation.  Normal PV with no vegetation.  Mildly calcified trileaflet aortic valve with no stenosis or regurgitation, no vegetation.  No ASD or PFO by color doppler.  Normal caliber aorta with minimal plaque.   Impression: No endocarditis.   Matthew [name] 05/21/2016 12:51 PM

## 2016-05-21 NOTE — Progress Notes (Signed)
Patient ID: Matthew [name], male   DOB: 10-30-1955, 60 y.o.   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)   . ceFEPime (MAXIPIME) IV  2 g Intravenous Q8H  . diltiazem  180 mg Oral Daily  . enoxaparin (LOVENOX) injection  0.5 mg/kg Subcutaneous Q24H  . folic acid  1 mg Oral Daily  . gabapentin  100 mg Oral TID  . insulin aspart  0-5 Units Subcutaneous QHS  . insulin aspart  0-9 Units Subcutaneous TID WC  . multivitamin with minerals  1 tablet Oral Daily  . sodium chloride flush  3 mL Intravenous Q12H  . thiamine  100 mg Oral Daily   Or  . thiamine  100 mg Intravenous Daily  . vancomycin  1,750 mg Intravenous Q24H    SUBJECTIVE: He is feeling better. He is very sleepy after his echocardiogram today.  Review of Systems: Review of Systems  Constitutional: Positive for malaise/fatigue. Negative for chills, diaphoresis, fever and weight loss.  Respiratory: Negative for cough, sputum production and shortness of breath.   Gastrointestinal: Negative for abdominal pain, diarrhea, nausea and vomiting.  Genitourinary: Negative for dysuria.  Musculoskeletal: Negative for joint pain.       The pain in his right lower leg has improved. He notes that it gets worse the more he is sitting or standing.    Past Medical  History:  Diagnosis Date  . Atrial fibrillation, chronic ([place])   . CHF (congestive heart failure) ([place])   . GERD (gastroesophageal reflux disease)   . Hypertension   . Septic shock ([place])   . Shortness of breath dyspnea     Social History  Substance Use Topics  . Smoking status: Former Smoker    Types: Cigarettes    Quit date: 11/01/1974  . Smokeless tobacco: Never Used     Comment: smoked in high school for about a year  . Alcohol use Yes    History reviewed. No pertinent family history. Allergies  Allergen Reactions  . Potassium-Containing Compounds Other (See Comments)    Reaction:  Stomach pain   . Doxycycline Other (See Comments)    Reaction:  Right side pain   . Augmentin [Amoxicillin-Pot Clavulanate] Nausea Only and Other (See Comments)    Has patient had a PCN reaction causing immediate rash, facial/tongue/throat swelling, SOB or lightheadedness with hypotension: No Has patient had a PCN reaction causing severe rash involving mucus membranes or skin necrosis: No Has patient had a PCN reaction that required hospitalization No Has patient had a PCN reaction occurring within the last 10 years: Yes If all of the above answers are "NO", then may proceed with Cephalosporin use.  [name]  Neomycin Rash    OBJECTIVE: Vitals:   05/21/16 1339 05/21/16 1345 05/21/16 1400 05/21/16 1553  BP:  (!) 114/103  (!) 112/53  Pulse: 95 87 97 90  Resp: 15 (!) 27 (!) 27 (!) 22  Temp:    97.7 F (36.5 C)  TempSrc:    Oral  SpO2: 93% 96% 95% 91%  Weight:      Height:       Body mass index is 61.58 kg/m.  Physical Exam  Constitutional:  He is sitting on the side of the bed eating dinner.  Skin:  He still has diffuse erythema and swelling of his right lower leg but overall it is looking better. There are no open areas of drainage or obvious fluctuance.    Lab Results Lab Results  Component Value Date   WBC 15.3 (H) 05/21/2016   HGB 15.2 05/21/2016   HCT 45.7 05/21/2016   MCV 95.8  05/21/2016   PLT 406 (H) 05/21/2016    Lab Results  Component Value Date   CREATININE 1.56 (H) 05/21/2016   BUN 30 (H) 05/21/2016   NA 135 05/21/2016   K 5.1 05/21/2016   CL 101 05/21/2016   CO2 25 05/21/2016    Lab Results  Component Value Date   ALT 21 05/18/2016   AST 41 05/18/2016   ALKPHOS 174 (H) 05/18/2016   BILITOT 2.1 (H) 05/18/2016     Microbiology: Recent Results (from the past 240 hour(s))  Blood culture (routine x 2)     Status: Abnormal   Collection Time: 05/17/16  6:35 PM  Result Value Ref Range Status   Specimen Description BLOOD LEFT HAND  Final   Special Requests IN PEDIATRIC BOTTLE 6CC  Final   Culture  Setup Time   Final    GRAM POSITIVE COCCI IN CLUSTERS AEROBIC BOTTLE ONLY CRITICAL RESULT CALLED TO, READ BACK BY AND VERIFIED WITH: J GRIMSLEY,PHARMD AT 2255 05/18/16 BY L BENFIELD Performed at [facility] (A)  Final   Report Status 05/20/2016 FINAL  Final   Organism ID, Bacteria METHICILLIN RESISTANT STAPHYLOCOCCUS AUREUS  Final      Susceptibility   Methicillin resistant staphylococcus aureus - MIC*    CIPROFLOXACIN <=0.5 SENSITIVE Sensitive     ERYTHROMYCIN >=8 RESISTANT Resistant     GENTAMICIN <=0.5 SENSITIVE Sensitive     OXACILLIN >=4 RESISTANT Resistant     TETRACYCLINE <=1 SENSITIVE Sensitive     VANCOMYCIN <=0.5 SENSITIVE Sensitive     TRIMETH/SULFA <=10 SENSITIVE Sensitive     CLINDAMYCIN >=8 RESISTANT Resistant     RIFAMPIN <=0.5 SENSITIVE Sensitive     Inducible Clindamycin NEGATIVE Sensitive     * METHICILLIN RESISTANT STAPHYLOCOCCUS AUREUS  Blood Culture ID Panel (Reflexed)     Status: Abnormal   Collection Time: 05/17/16  6:35 PM  Result Value Ref Range Status   Enterococcus species NOT DETECTED NOT DETECTED Final   Vancomycin resistance NOT DETECTED NOT DETECTED Final   Listeria monocytogenes NOT DETECTED NOT DETECTED Final   Staphylococcus species DETECTED (A) NOT  DETECTED Final    Comment: CRITICAL RESULT CALLED TO, READ BACK BY AND VERIFIED WITH: TO JGRIMSLEY(PHARD) BY TCLEVELAND 05/18/2016 AT 10:55PM    Staphylococcus aureus DETECTED (A) NOT DETECTED Final    Comment: CRITICAL RESULT CALLED TO, READ BACK BY AND VERIFIED WITH: TO JGRIMSLEY(PHARD) BY TCLEVELAND 05/18/2016 AT 10:55PM    Methicillin resistance DETECTED (A) NOT DETECTED Final  Comment: CRITICAL RESULT CALLED TO, READ BACK BY AND VERIFIED WITH: TO JGRIMSLEY(PHARD) BY TCLEVELAND 05/18/2016 AT 10:55PM    Streptococcus species NOT DETECTED NOT DETECTED Final   Streptococcus agalactiae NOT DETECTED NOT DETECTED Final   Streptococcus pneumoniae NOT DETECTED NOT DETECTED Final   Streptococcus pyogenes NOT DETECTED NOT DETECTED Final   Acinetobacter baumannii NOT DETECTED NOT DETECTED Final   Enterobacteriaceae species NOT DETECTED NOT DETECTED Final   Enterobacter cloacae complex NOT DETECTED NOT DETECTED Final   Escherichia coli NOT DETECTED NOT DETECTED Final   Klebsiella oxytoca NOT DETECTED NOT DETECTED Final   Klebsiella pneumoniae NOT DETECTED NOT DETECTED Final   Proteus species NOT DETECTED NOT DETECTED Final   Serratia marcescens NOT DETECTED NOT DETECTED Final   Carbapenem resistance NOT DETECTED NOT DETECTED Final   Haemophilus influenzae NOT DETECTED NOT DETECTED Final   Neisseria meningitidis NOT DETECTED NOT DETECTED Final   Pseudomonas aeruginosa NOT DETECTED NOT DETECTED Final   Candida albicans NOT DETECTED NOT DETECTED Final   Candida glabrata NOT DETECTED NOT DETECTED Final   Candida krusei NOT DETECTED NOT DETECTED Final   Candida parapsilosis NOT DETECTED NOT DETECTED Final   Candida tropicalis NOT DETECTED NOT DETECTED Final    Comment: Performed at [facility]  Blood culture (routine x 2)     Status: None (Preliminary result)   Collection Time: 05/17/16  6:45 PM  Result Value Ref Range Status   Specimen Description BLOOD RIGHT HAND  Final   Special  Requests BOTTLES DRAWN AEROBIC ONLY 5CC  Final   Culture   Final    NO GROWTH 3 DAYS Performed at [facility]    Report Status PENDING  Incomplete  Urine culture     Status: Abnormal (Preliminary result)   Collection Time: 05/17/16  6:50 PM  Result Value Ref Range Status   Specimen Description URINE, CLEAN CATCH  Final   Special Requests NONE  Final   Culture (A)  Final    >=100,000 COLONIES/mL METHICILLIN RESISTANT STAPHYLOCOCCUS AUREUS CULTURE REINCUBATED FOR BETTER GROWTH Performed at [facility]    Report Status PENDING  Incomplete   Organism ID, Bacteria METHICILLIN RESISTANT STAPHYLOCOCCUS AUREUS (A)  Final      Susceptibility   Methicillin resistant staphylococcus aureus - MIC*    CIPROFLOXACIN <=0.5 SENSITIVE Sensitive     GENTAMICIN <=0.5 SENSITIVE Sensitive     NITROFURANTOIN <=16 SENSITIVE Sensitive     OXACILLIN >=4 RESISTANT Resistant     TETRACYCLINE >=16 RESISTANT Resistant     VANCOMYCIN <=0.5 SENSITIVE Sensitive     TRIMETH/SULFA <=10 SENSITIVE Sensitive     CLINDAMYCIN >=8 RESISTANT Resistant     RIFAMPIN <=0.5 SENSITIVE Sensitive     Inducible Clindamycin NEGATIVE Sensitive     * >=100,000 COLONIES/mL METHICILLIN RESISTANT STAPHYLOCOCCUS AUREUS  Culture, blood (routine x 2)     Status: None (Preliminary result)   Collection Time: 05/19/16  3:51 PM  Result Value Ref Range Status   Specimen Description BLOOD RIGHT HAND  Final   Special Requests IN PEDIATRIC BOTTLE 2CC  Final   Culture  Setup Time   Final    GRAM NEGATIVE RODS AEROBIC BOTTLE ONLY Organism ID to follow CRITICAL RESULT CALLED TO, READ BACK BY AND VERIFIED WITH: CARON AMEND,PHARMD @0114  05/21/16 MKELLY    Culture TOO YOUNG TO READ  Final   Report Status PENDING  Incomplete  Blood Culture ID Panel (Reflexed)     Status: None   Collection Time:  05/19/16  3:51 PM  Result Value Ref Range Status   Enterococcus species NOT DETECTED NOT DETECTED Final   Vancomycin resistance NOT  DETECTED NOT DETECTED Final   Listeria monocytogenes NOT DETECTED NOT DETECTED Final   Staphylococcus species NOT DETECTED NOT DETECTED Final   Staphylococcus aureus NOT DETECTED NOT DETECTED Final   Methicillin resistance NOT DETECTED NOT DETECTED Final   Streptococcus species NOT DETECTED NOT DETECTED Final   Streptococcus agalactiae NOT DETECTED NOT DETECTED Final   Streptococcus pneumoniae NOT DETECTED NOT DETECTED Final   Streptococcus pyogenes NOT DETECTED NOT DETECTED Final   Acinetobacter baumannii NOT DETECTED NOT DETECTED Final   Enterobacteriaceae species NOT DETECTED NOT DETECTED Final   Enterobacter cloacae complex NOT DETECTED NOT DETECTED Final   Escherichia coli NOT DETECTED NOT DETECTED Final   Klebsiella oxytoca NOT DETECTED NOT DETECTED Final   Klebsiella pneumoniae NOT DETECTED NOT DETECTED Final   Proteus species NOT DETECTED NOT DETECTED Final   Serratia marcescens NOT DETECTED NOT DETECTED Final   Carbapenem resistance NOT DETECTED NOT DETECTED Final   Haemophilus influenzae NOT DETECTED NOT DETECTED Final   Neisseria meningitidis NOT DETECTED NOT DETECTED Final   Pseudomonas aeruginosa NOT DETECTED NOT DETECTED Final   Candida albicans NOT DETECTED NOT DETECTED Final   Candida glabrata NOT DETECTED NOT DETECTED Final   Candida krusei NOT DETECTED NOT DETECTED Final   Candida parapsilosis NOT DETECTED NOT DETECTED Final   Candida tropicalis NOT DETECTED NOT DETECTED Final  Culture, blood (routine x 2)     Status: None (Preliminary result)   Collection Time: 05/19/16  6:40 PM  Result Value Ref Range Status   Specimen Description BLOOD RIGHT HAND  Final   Special Requests BOTTLES DRAWN AEROBIC ONLY 5CC  Final   Culture NO GROWTH 2 DAYS  Final   Report Status PENDING  Incomplete     ASSESSMENT: He has recurrent right lower extremity cellulitis with transient MRSA bacteremia. That is improving. Today's TEE did not reveal any evidence of endocarditis. Repeat  blood cultures were obtained 2 days ago and one is growing a gram-negative rod that was not detected on the [employer] panel. It may be an insignificant contaminant but I agree with continuing cefepime pending final blood cultures. I will plan on treating him with vancomycin for 2 weeks for his MRSA bacteremia.  PLAN: 1. Continue current antibiotics pending final blood culture results  Michel Bickers, MD [facility] for [place] [phone] pager   [phone] cell 05/21/2016, 4:52 PM

## 2016-05-21 NOTE — Anesthesia Procedure Notes (Addendum)
Procedure Name: Intubation Date/Time: 05/21/2016 12:25 PM Performed by: Carney Living Pre-anesthesia Checklist: Patient identified, Emergency Drugs available, Suction available, Patient being monitored and Timeout performed Patient Re-evaluated:Patient Re-evaluated prior to inductionOxygen Delivery Method: Circle system utilized Preoxygenation: Pre-oxygenation with 100% oxygen Intubation Type: IV induction Laryngoscope Size: Mac and 4 Grade View: Grade I Tube type: Oral Tube size: 7.5 mm Placement Confirmation: ETT inserted through vocal cords under direct vision,  positive ETCO2 and breath sounds checked- equal and bilateral Secured at: 23 cm Tube secured with: Tape Dental Injury: Teeth and Oropharynx as per pre-operative assessment

## 2016-05-21 NOTE — Progress Notes (Signed)
  Echocardiogram Echocardiogram Transesophageal has been performed.  Matthew [name] 05/21/2016, 1:10 PM

## 2016-05-21 NOTE — Progress Notes (Signed)
PHARMACY - PHYSICIAN COMMUNICATION CRITICAL VALUE ALERT - BLOOD CULTURE IDENTIFICATION (BCID)  Results for orders placed or performed during the hospital encounter of 05/17/16  Blood Culture ID Panel (Reflexed) (Collected: 05/19/2016  3:51 PM)  Result Value Ref Range   Enterococcus species NOT DETECTED NOT DETECTED   Vancomycin resistance NOT DETECTED NOT DETECTED   Listeria monocytogenes NOT DETECTED NOT DETECTED   Staphylococcus species NOT DETECTED NOT DETECTED   Staphylococcus aureus NOT DETECTED NOT DETECTED   Methicillin resistance NOT DETECTED NOT DETECTED   Streptococcus species NOT DETECTED NOT DETECTED   Streptococcus agalactiae NOT DETECTED NOT DETECTED   Streptococcus pneumoniae NOT DETECTED NOT DETECTED   Streptococcus pyogenes NOT DETECTED NOT DETECTED   Acinetobacter baumannii NOT DETECTED NOT DETECTED   Enterobacteriaceae species NOT DETECTED NOT DETECTED   Enterobacter cloacae complex NOT DETECTED NOT DETECTED   Escherichia coli NOT DETECTED NOT DETECTED   Klebsiella oxytoca NOT DETECTED NOT DETECTED   Klebsiella pneumoniae NOT DETECTED NOT DETECTED   Proteus species NOT DETECTED NOT DETECTED   Serratia marcescens NOT DETECTED NOT DETECTED   Carbapenem resistance NOT DETECTED NOT DETECTED   Haemophilus influenzae NOT DETECTED NOT DETECTED   Neisseria meningitidis NOT DETECTED NOT DETECTED   Pseudomonas aeruginosa NOT DETECTED NOT DETECTED   Candida albicans NOT DETECTED NOT DETECTED   Candida glabrata NOT DETECTED NOT DETECTED   Candida krusei NOT DETECTED NOT DETECTED   Candida parapsilosis NOT DETECTED NOT DETECTED   Candida tropicalis NOT DETECTED NOT DETECTED   Gram stain in 1/2 blood culture growing GNR  Name of physician (or Provider) Contacted: Randalyn Rhea, MD  Changes to prescribed antibiotics required: Add cefepime  Sherlon Handing, PharmD, BCPS Clinical pharmacist, pager [phone] 05/21/2016  3:05 AM

## 2016-05-21 NOTE — Transfer of Care (Signed)
Immediate Anesthesia Transfer of Care Note  Patient: Matthew [name]  Procedure(s) Performed: Procedure(s): TRANSESOPHAGEAL ECHOCARDIOGRAM (TEE) (N/A)  Patient Location: PACU  Anesthesia Type:General  Level of Consciousness: awake, alert , oriented and patient cooperative  Airway & Oxygen Therapy: Patient Spontanous Breathing and Pt placed on Bipap AUTO with15L 02  Post-op Assessment: Report given to RN, Post -op Vital signs reviewed and stable and Patient moving all extremities X 4  Post vital signs: Reviewed and stable  Last Vitals:  Vitals:   05/21/16 1335 05/21/16 1339  BP: (!) 180/72   Pulse: (!) 105 95  Resp: (!) 25 15  Temp:      Last Pain:  Vitals:   05/21/16 1121  TempSrc: Oral  PainSc:          Complications: No apparent anesthesia complications

## 2016-05-22 ENCOUNTER — Encounter (HOSPITAL_COMMUNITY): Payer: Self-pay | Admitting: Cardiology

## 2016-05-22 ENCOUNTER — Inpatient Hospital Stay (HOSPITAL_COMMUNITY): Payer: Medicare Other

## 2016-05-22 DIAGNOSIS — F1010 Alcohol abuse, uncomplicated: Secondary | ICD-10-CM

## 2016-05-22 LAB — CBC
HEMATOCRIT: 43.3 % (ref 39.0–52.0)
Hemoglobin: 14.1 g/dL (ref 13.0–17.0)
MCH: 31.6 pg (ref 26.0–34.0)
MCHC: 32.6 g/dL (ref 30.0–36.0)
MCV: 97.1 fL (ref 78.0–100.0)
Platelets: 446 10*3/uL — ABNORMAL HIGH (ref 150–400)
RBC: 4.46 MIL/uL (ref 4.22–5.81)
RDW: 16.4 % — AB (ref 11.5–15.5)
WBC: 15 10*3/uL — AB (ref 4.0–10.5)

## 2016-05-22 LAB — BASIC METABOLIC PANEL
ANION GAP: 8 (ref 5–15)
BUN: 27 mg/dL — ABNORMAL HIGH (ref 6–20)
CALCIUM: 8.5 mg/dL — AB (ref 8.9–10.3)
CO2: 24 mmol/L (ref 22–32)
Chloride: 102 mmol/L (ref 101–111)
Creatinine, Ser: 1.49 mg/dL — ABNORMAL HIGH (ref 0.61–1.24)
GFR, EST AFRICAN AMERICAN: 58 mL/min — AB (ref >60)
GFR, EST NON AFRICAN AMERICAN: 50 mL/min — AB (ref >60)
Glucose, Bld: 94 mg/dL (ref 65–99)
Potassium: 4.9 mmol/L (ref 3.5–5.1)
Sodium: 134 mmol/L — ABNORMAL LOW (ref 135–145)

## 2016-05-22 LAB — GLUCOSE, CAPILLARY
GLUCOSE-CAPILLARY: 185 mg/dL — AB (ref 65–99)
Glucose-Capillary: 102 mg/dL — ABNORMAL HIGH (ref 65–99)
Glucose-Capillary: 105 mg/dL — ABNORMAL HIGH (ref 65–99)
Glucose-Capillary: 109 mg/dL — ABNORMAL HIGH (ref 65–99)

## 2016-05-22 LAB — URINE CULTURE

## 2016-05-22 MED ORDER — ASPIRIN EC 81 MG PO TBEC
81.0000 mg | DELAYED_RELEASE_TABLET | Freq: Every day | ORAL | Status: DC
  Administered 2016-05-22 – 2016-05-27 (×6): 81 mg via ORAL
  Filled 2016-05-22 (×6): qty 1

## 2016-05-22 MED ORDER — ASPIRIN 81 MG PO CHEW: 81.0000 mg | CHEWABLE_TABLET | Freq: Every day | ORAL | Status: DC

## 2016-05-22 MED ORDER — FUROSEMIDE 10 MG/ML IJ SOLN
20.0000 mg | Freq: Once | INTRAMUSCULAR | Status: AC
  Administered 2016-05-22: 20 mg via INTRAVENOUS
  Filled 2016-05-22: qty 2

## 2016-05-22 NOTE — Progress Notes (Signed)
Family Medicine Teaching Service Daily Progress Note Intern Pager: [phone]  Patient name: Matthew [name] Medical record number: [id number] Date of birth: [birth year] Age: 60 y.o. Gender: male  Primary Care Provider: Junie Panning, DO Consultants: ID  Code Status: FULL  Pt Overview and Major Events to Date:  TEE tolerated well. Pt hypoxic to 86 overnight, reports wearing his CPAP. Oxygen applied but this morning saturating 95% without O2.   Assessment and Plan: MRSA bacteremia, sepsis: Presented with feeling unwell and chills at home. Has been afebrile. In [facility] ED, tachypneic to 30, BP 111/73, HR 114. Lactate 3.55, WBC 23.9. Patient meeting sepsis criteria w/ suspected source of infection RLE cellulitis.Lactate decreased to 2.70with 1L bolus. Repeat lactate here 2.2>2.8>1.8. Vanc/zosyn given. UA with large leuks, no nitrites, no bacteria. CXR with vascular congestion, Small right pleural effusion noted. Borderline cardiomegaly. Increased interstitial markings may reflect mild interstitial edema. Most likely source of bacteremia is RLE cellulitis. Possible alternative sources include bilateral lateral midfoot eschars, urine (less likely), SBP, orpneumonia. APTT 33. Procalcitonin 2.43. INR 1.53. WBC increased to 31.3>22.6>15.3>15.3. Blood cultures growing MRSA. Repeat blood cultures pending. Repeat CXR showed stable R basilar infiltrate and associated effusion. Abdominal US showed moderate cirrhosis, no ascites.  -monitor urine cultures > MRSA -continue vanc -Repeat BCx grew GNR in one of 3 bottles, broadened to cefepime  -ID automatic consult for MRSA bactermia: ordered repeat BCx, do not place PICC until those are negative. Agree with TEE.  Continued recs appreciated. -TEE showed no vegetations, normal EF -erythema decreased from yesterday  Anasarca and alcoholic cirrhosis: Wt [id number]. Admit weight 415. Most recent weight in clinic 409.Recent weight increase in clinic led to  increasing spiro and HCTZ.Also on Demadex outpatient. BNP >600 (baseline ~200). Last echo 01/2015 EF 0000000, normal systolic function, wall motion poorly visualized. Given IV lasix 60mg  once. Abdominal US moderate cirrhosis, no ascites. Echo EF 65%, mild basal septal LV hypertrophy, indeterminate diastolic function 2/2 afib.  -consider lasix 60mg  IV oncetoday. Wt is up to 429, but bilateral lower extremity swelling is unchanged. -Strict I/Os -Daily weights -holding home HCTZ, spiro, and torsemide  DMII: Diet controlled. Cover with SSI. Most recent A1C 6.0 in April. Received 4 units SSI over the past 24 hours. -repeat HgbA1c on admission = 6.9. Consider metformin as outpatient vs dietary changes. -continue home neurontin -sensitive sliding scale insulin -monitor CBGs  AKI: Cr baseline 1.08. Cr now 1.83>1.75>1.74>1.49.  - Feurea = 32%, likely prerenal 2/2 sepsis.  - continue to monitor  HTN: Home meds are HCTZ 12.5mg  daily.  -holding HCTZ as pt has been normotensive  Afib: Rate controlled on dilt, will continue.  -continue dilt w/ holding parameters SBP<110, DBP<60 -regarding anticoagulation, CHADsvasc score is 2, pt has discussed aspirin in the past, doesn't feel he needs it. Consider ASA, although this is not typically indicated in [facility].   OSA: Uses CPAP at home.  -continue CPAP qhs  FEN/GI: heart healthy/carb moddiet, SSI Prophylaxis: lovenox  Disposition: continued management of bacteremia  Subjective:  Pt mostly concerned with attempting to pinpoint the source of his lower extremity swelling. No pain, he is not concerned with his hypoxia overnight, reports that he wore his CPAP last night. Asked to remove his [place] > sat 95 on RA.   Objective: Temp:  [97.7 F (36.5 C)-98.4 F (36.9 C)] 98.1 F (36.7 C) (08/24 0420) Pulse Rate:  [67-105] 67 (08/24 0420) Resp:  [15-27] 20 (08/24 0420) BP: (100-180)/(47-103) 100/47 (08/24 0420) SpO2:  [86 %-96 %]  86 % (08/24  0420) Weight:  [196.5 kg (433 lb 1.6 oz)] 196.5 kg (433 lb 1.6 oz) (08/24 0420) Physical Exam: General: NAD, morbidly obese male, pleasant. ENTM: mucosa pink and moist Cardiovascular:difficult to auscultate 2/2 body habitus but sounds like anirregularly irregular rhythm, no m/r/g Respiratory: CTAB, good air movement  Abdomen: large pannus w/ marked thickening of skin on the inferior aspect, SNT, umbilical hernia not reducible and nontender Extremities: 2+ BLE, skin darkening consistent with chronic venous stasis, R leg erythematous and warm up to lateral knee. Erythema extends about 2 inches beyond skin markings from 8/20. Nonpurlent.   Laboratory:  Recent Labs Lab 05/20/16 0921 05/21/16 0311 05/22/16 0514  WBC 15.3* 15.3* 15.0*  HGB 15.9 15.2 14.1  HCT 47.4 45.7 43.3  PLT 408* 406* 446*    Recent Labs Lab 05/17/16 1840 05/18/16 0428 05/19/16 0420 05/20/16 0337 05/21/16 0311  NA 135 134* 134* 135 135  K 3.6 3.8 3.5 4.1 5.1  CL 91* 92* 97* 95* 101  CO2 33* 31 29 31 25   BUN 18 23* 30* 32* 30*  CREATININE 1.08 1.83* 1.75* 1.74* 1.56*  CALCIUM 9.6 9.5 8.4* 8.9 8.8*  PROT 8.7* 7.9  --   --   --   BILITOT 1.6* 2.1*  --   --   --   ALKPHOS 184* 174*  --   --   --   ALT 24 21  --   --   --   AST 40 41  --   --   --   GLUCOSE 172* 144* 97 85 65    Imaging/Diagnostic Tests: TEE - Normal LV size with moderate LV hypertrophy.  EF 60-65%.  Normal wall motion.  Normal right ventricular size and systolic function.  Moderate left atrial enlargement with no LA appendage thrombus.  Moderate right atrial enlargement.  Trivial TR, no vegetation.  Trivial MR, no vegetation.  Normal PV with no vegetation.  Mildly calcified trileaflet aortic valve with no stenosis or regurgitation, no vegetation.  No ASD or PFO by color doppler.  Normal caliber aorta with minimal plaque.   Sela Hilding, MD 05/22/2016, 6:58 AM PGY-1, [place] Intern pager: [phone], text  pages welcome

## 2016-05-22 NOTE — Progress Notes (Signed)
Nutrition Education Note  RD consulted assessment of nutrition status/recommendations due to liver failure and low albumin. Low albumin is likely due to fluid retention/anasarca and will improve when fluid balance improves. Pt reports having a good appetite and eating well. He reports difficulty with mobility therefore he eats ready-prepared or easy to prepare food.  Breakfast: leftover pizza or pasta, or cheese toast, or cottage cheese with fruit, or just fruit. Lunch: pastrami, bologna, or corned beef sub Dinner: pizza, pasta, chinese food, or foods from the hot bar at [employer] or [employer] Drinks: 3-5 cups of cranberry juice daily, a few diet sodas daily, and regular soda sometimes  RD discussed how a low sodium diet can help prevent fluid retention and shortness of breath and is also important with liver failure. Also discussed the effects of excessive juice consumption on blood sugar levels. Reviewed pt's recent hemoglobin A1c level of 6.9% to which he responded, " No, it's only 6". RD discussed that lab was checked again on 05/18/16 and was 6/9%.   RD provided "Low Sodium Nutrition Therapy" and "Type 2 Diabetes Nutrition Therapy" handout from the Academy of Nutrition and Dietetics. Provided examples on ways to decrease sodium intake in diet. Discouraged intake of processed foods and use of salt shaker. Encouraged fresh fruits and vegetables as well as whole grain sources of carbohydrates to maximize fiber intake.   Discussed different food groups and their effects on blood sugar, emphasizing carbohydrate-containing foods. Provided list of carbohydrates and recommended serving sizes of common foods.  Discussed importance of controlled and consistent carbohydrate intake throughout the day. Provided examples of ways to balance meals/snacks and encouraged intake of high-fiber, whole grain complex carbohydrates.   Teach back method used. No evidence of learning. Pt stated that he understands  the information provided, but was unable to share any changes that he could make in his diet to decrease sodium or carb intake. Pt changes the subject to his medical issues needing to be corrected first. RD left handouts and encouraged patient to review and brainstorm questions for tomorrow. RD will follow-up tomorrow as time allows.   Body mass index is 62.14 kg/m. Pt meets criteria for Morbid Obesity based on current BMI.  Current diet order is Heart Healthy/Carb Modified, patient is consuming approximately 100% of meals at this time. Labs and medications reviewed.   Scarlette Ar RD, LDN, CSP Inpatient Clinical Dietitian Pager: [phone] After Hours Pager: [phone]

## 2016-05-22 NOTE — Progress Notes (Signed)
[facility]  Patient Status: New pt for [facility] this hospital admission  [facility] is providing the following services: HHRN and [facility] for home IV ABX. [facility] hospital team will follow [name] during hospital course to support transition home  when ordered.   If patient discharges after hours, please call [phone].   Matthew [name] 05/22/2016, 11:05 AM

## 2016-05-22 NOTE — Progress Notes (Signed)
OT Cancellation Note  Patient Details Name: JEIDEN HOESING MRN: [id number] DOB: 11/06/1955   Cancelled Treatment:    Reason Eval/Treat Not Completed: Other (comment) (Pt reporting discomfort.) Pt reporting that he has been up recently this morning and "could not get comfortable." Request that OT reattempt later. Will reattempt as schedule permits.    Tyrone Schimke OTR/L Pager: [phone]  05/22/2016, 9:19 AM

## 2016-05-22 NOTE — Care Management Note (Signed)
Case Management Note  Patient Details  Name: Matthew [name] MRN: [id number] Date of Birth: 1955-11-07  Subjective/Objective:         Admitted with MRSA Bacteremia           Action/Plan: Patient is to go home on IV antibiotics; [place] choice offered for [facility], patient chose [place]; Pam RN with [facility] called for arrangements  Expected Discharge Date:   possibly 05/28/2016               Expected Discharge Plan:  [place]    Discharge planning Services  CM Consult   Choice offered to:  Patient  HH Arranged:  RN [facility] Agency:  [place]  Status of Service:  In process, will continue to follow  Sherrilyn Rist [phone] 05/22/2016, 10:38 AM

## 2016-05-22 NOTE — Progress Notes (Signed)
Pharmacy Antibiotic Note  Matthew [name] is a 60 y.o. male admitted on 05/17/2016 with MRSA bacteremia.  Pharmacy already dosing Vancomycin (Day #5). Pt now with blood culture growing GNR in 1/2. BCID negative for all organisms it tested. Pharmacy has been consulted for Cefepime dosing.  Day #6 of abx for MRSA bacteremia secondary to RLE cellulitis and possible GNR bacteremia. 1/2 blood cx showed GNRs, BCID did not detect anything, may be a contaminant. Awaiting final cx. TEE neg for IE. Cellulitis improving. ID on board with plans to treat for 2 weeks with vancomycin. Remains afebrile, WBC down 15.0 (peaked at 31.3) SCr trending down to 1.49. Normalized CrCl ~61ml/min.  Plan: Continue vancomycin 1,750mg  IV Q24 Continue cefepime 2g IV Q24 pending final cx Monitor clinical picture, renal function, recheck VT at new Css (8/25 or 26) F/U C&S, LOT  Height: 5\' 10"  (177.8 cm) Weight: (!) 433 lb 1.6 oz (196.5 kg) (scale a) IBW/kg (Calculated) : 73  Temp (24hrs), Avg:98 F (36.7 C), Min:97.7 F (36.5 C), Max:98.4 F (36.9 C)   Recent Labs Lab 05/18/16 0428 05/18/16 0755 05/18/16 1507 05/18/16 1842 05/18/16 2207 05/19/16 0420 05/19/16 2202 05/20/16 0337 05/20/16 0921 05/21/16 0311 05/22/16 0514  WBC 31.3*  --   --   --   --  22.6*  --   --  15.3* 15.3* 15.0*  CREATININE 1.83*  --   --   --   --  1.75*  --  1.74*  --  1.56* 1.49*  LATICACIDVEN 2.2* 2.2* 2.8* 2.0* 1.8  --   --   --   --   --   --   VANCOTROUGH  --   --   --   --   --   --  31*  --   --   --   --     Estimated Creatinine Clearance: 92.4 mL/min (by C-G formula based on SCr of 1.49 mg/dL).    Allergies  Allergen Reactions  . Potassium-Containing Compounds Other (See Comments)    Reaction:  Stomach pain   . Doxycycline Other (See Comments)    Reaction:  Right side pain   . Augmentin [Amoxicillin-Pot Clavulanate] Nausea Only and Other (See Comments)    Has patient had a PCN reaction causing immediate rash,  facial/tongue/throat swelling, SOB or lightheadedness with hypotension: No Has patient had a PCN reaction causing severe rash involving mucus membranes or skin necrosis: No Has patient had a PCN reaction that required hospitalization No Has patient had a PCN reaction occurring within the last 10 years: Yes If all of the above answers are "NO", then may proceed with Cephalosporin use.  [name] Neomycin Rash    Antimicrobials this admission: 8/19 Zosyn >> 8/21 8/19 Vanc >> 8/23 Cefepime >>  Dose adjustments this admission: 8/21 VT = 31 on 1250mg  q12h. 1000 dose not given until 1500 - level drawn 7hr later. Est. half-life 19.8 hrs, Ke 0.035, Vd 133.84L ->0.7 L/kg. Change to 1750 q24h  Microbiology results: 8/21 BCx x2: 1/2 GNR on gram stain; BCID neg for organisms 8/19 BCx x2: 1/2 MRSA - cipro-S, gent-S, rifa-S, tetra-S, bactrim-S 8/19 UCx: MRSA - cipro-S, nitro-S, bactrim-S, gent-S  Thank you for allowing pharmacy to be a part of this patient's care.  Elenor Quinones, PharmD, BCPS Clinical Pharmacist Pager [phone] 05/22/2016 8:54 AM

## 2016-05-22 NOTE — Progress Notes (Signed)
Patient ID: Matthew [name], male   DOB: [birth year], 60 y.o.   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 aspirin EC  81 mg Oral Daily  . ceFEPime (MAXIPIME) IV  2 g Intravenous Q8H  . diltiazem  180 mg Oral Daily  . enoxaparin (LOVENOX) injection  0.5 mg/kg Subcutaneous Q24H  . folic acid  1 mg Oral Daily  . gabapentin  100 mg Oral TID  . insulin aspart  0-5 Units Subcutaneous QHS  . insulin aspart  0-9 Units Subcutaneous TID WC  . multivitamin with minerals  1 tablet Oral Daily  . sodium chloride flush  3 mL Intravenous Q12H  . thiamine  100 mg Oral Daily   Or  . thiamine  100 mg Intravenous Daily  . vancomycin  1,750 mg Intravenous Q24H    SUBJECTIVE: He is feeling better with less right leg pain. He continues to be very sleepy today.  Review of Systems: Review of Systems  Constitutional: Positive for malaise/fatigue. Negative for chills, diaphoresis, fever and weight loss.  Respiratory: Negative for cough, sputum production and shortness of breath.   Gastrointestinal: Negative for abdominal pain, diarrhea, nausea and vomiting.  Genitourinary: Negative for dysuria.  Musculoskeletal: Negative for joint pain.       The pain in his right lower leg has improved.     Past Medical History:    Diagnosis Date  . Atrial fibrillation, chronic ([place])   . CHF (congestive heart failure) ([place])   . GERD (gastroesophageal reflux disease)   . Hypertension   . Septic shock ([place])   . Shortness of breath dyspnea     Social History  Substance Use Topics  . Smoking status: Former Smoker    Types: Cigarettes    Quit date: 11/01/1974  . Smokeless tobacco: Never Used     Comment: smoked in high school for about a year  . Alcohol use Yes    History reviewed. No pertinent family history. Allergies  Allergen Reactions  . Potassium-Containing Compounds Other (See Comments)    Reaction:  Stomach pain   . Doxycycline Other (See Comments)    Reaction:  Right side pain   . Augmentin [Amoxicillin-Pot Clavulanate] Nausea Only and Other (See Comments)    Has patient had a PCN reaction causing immediate rash, facial/tongue/throat swelling, SOB or lightheadedness with hypotension: No Has patient had a PCN reaction causing severe rash involving mucus membranes or skin necrosis: No Has patient had a PCN reaction that required hospitalization No Has patient had a PCN reaction occurring within the last 10 years: Yes If all of the above answers are "NO", then may proceed with  Cephalosporin use.  [name] Neomycin Rash    OBJECTIVE: Vitals:   05/21/16 2031 05/22/16 0420 05/22/16 0659 05/22/16 1143  BP: (!) 146/69 (!) 100/47  137/71  Pulse: 87 67  86  Resp: (!) 22 20 20 18   Temp: 98.4 F (36.9 C) 98.1 F (36.7 C)  98.4 F (36.9 C)  TempSrc: Oral Oral  Oral  SpO2: 91% (!) 86% 90% 90%  Weight:  (!) 433 lb 1.6 oz (196.5 kg)    Height:       Body mass index is 62.14 kg/m.  Physical Exam  Constitutional:  He is sitting on the side of the bed. He is having trouble staying awake.  Skin:  He still has diffuse erythema and swelling of his right lower leg but overall it is looking better.     Lab Results Lab Results  Component Value Date   WBC 15.0 (H) 05/22/2016   HGB 14.1 05/22/2016   HCT 43.3  05/22/2016   MCV 97.1 05/22/2016   PLT 446 (H) 05/22/2016    Lab Results  Component Value Date   CREATININE 1.49 (H) 05/22/2016   BUN 27 (H) 05/22/2016   NA 134 (L) 05/22/2016   K 4.9 05/22/2016   CL 102 05/22/2016   CO2 24 05/22/2016    Lab Results  Component Value Date   ALT 21 05/18/2016   AST 41 05/18/2016   ALKPHOS 174 (H) 05/18/2016   BILITOT 2.1 (H) 05/18/2016     Microbiology: Recent Results (from the past 240 hour(s))  Blood culture (routine x 2)     Status: Abnormal   Collection Time: 05/17/16  6:35 PM  Result Value Ref Range Status   Specimen Description BLOOD LEFT HAND  Final   Special Requests IN PEDIATRIC BOTTLE 6CC  Final   Culture  Setup Time   Final    GRAM POSITIVE COCCI IN CLUSTERS AEROBIC BOTTLE ONLY CRITICAL RESULT CALLED TO, READ BACK BY AND VERIFIED WITH: J GRIMSLEY,PHARMD AT 2255 05/18/16 BY L BENFIELD Performed at [facility] (A)  Final   Report Status 05/20/2016 FINAL  Final   Organism ID, Bacteria METHICILLIN RESISTANT STAPHYLOCOCCUS AUREUS  Final      Susceptibility   Methicillin resistant staphylococcus aureus - MIC*    CIPROFLOXACIN <=0.5 SENSITIVE Sensitive     ERYTHROMYCIN >=8 RESISTANT Resistant     GENTAMICIN <=0.5 SENSITIVE Sensitive     OXACILLIN >=4 RESISTANT Resistant     TETRACYCLINE <=1 SENSITIVE Sensitive     VANCOMYCIN <=0.5 SENSITIVE Sensitive     TRIMETH/SULFA <=10 SENSITIVE Sensitive     CLINDAMYCIN >=8 RESISTANT Resistant     RIFAMPIN <=0.5 SENSITIVE Sensitive     Inducible Clindamycin NEGATIVE Sensitive     * METHICILLIN RESISTANT STAPHYLOCOCCUS AUREUS  Blood Culture ID Panel (Reflexed)     Status: Abnormal   Collection Time: 05/17/16  6:35 PM  Result Value Ref Range Status   Enterococcus species NOT DETECTED NOT DETECTED Final   Vancomycin resistance NOT DETECTED NOT DETECTED Final   Listeria monocytogenes NOT DETECTED NOT DETECTED Final    Staphylococcus species DETECTED (A) NOT DETECTED Final    Comment: CRITICAL RESULT CALLED TO, READ BACK BY AND VERIFIED WITH: TO JGRIMSLEY(PHARD) BY TCLEVELAND 05/18/2016 AT 10:55PM    Staphylococcus aureus DETECTED (A) NOT DETECTED Final    Comment: CRITICAL RESULT CALLED TO, READ BACK BY AND VERIFIED WITH: TO JGRIMSLEY(PHARD) BY TCLEVELAND 05/18/2016 AT 10:55PM  Methicillin resistance DETECTED (A) NOT DETECTED Final    Comment: CRITICAL RESULT CALLED TO, READ BACK BY AND VERIFIED WITH: TO JGRIMSLEY(PHARD) BY TCLEVELAND 05/18/2016 AT 10:55PM    Streptococcus species NOT DETECTED NOT DETECTED Final   Streptococcus agalactiae NOT DETECTED NOT DETECTED Final   Streptococcus pneumoniae NOT DETECTED NOT DETECTED Final   Streptococcus pyogenes NOT DETECTED NOT DETECTED Final   Acinetobacter baumannii NOT DETECTED NOT DETECTED Final   Enterobacteriaceae species NOT DETECTED NOT DETECTED Final   Enterobacter cloacae complex NOT DETECTED NOT DETECTED Final   Escherichia coli NOT DETECTED NOT DETECTED Final   Klebsiella oxytoca NOT DETECTED NOT DETECTED Final   Klebsiella pneumoniae NOT DETECTED NOT DETECTED Final   Proteus species NOT DETECTED NOT DETECTED Final   Serratia marcescens NOT DETECTED NOT DETECTED Final   Carbapenem resistance NOT DETECTED NOT DETECTED Final   Haemophilus influenzae NOT DETECTED NOT DETECTED Final   Neisseria meningitidis NOT DETECTED NOT DETECTED Final   Pseudomonas aeruginosa NOT DETECTED NOT DETECTED Final   Candida albicans NOT DETECTED NOT DETECTED Final   Candida glabrata NOT DETECTED NOT DETECTED Final   Candida krusei NOT DETECTED NOT DETECTED Final   Candida parapsilosis NOT DETECTED NOT DETECTED Final   Candida tropicalis NOT DETECTED NOT DETECTED Final    Comment: Performed at [facility]  Blood culture (routine x 2)     Status: None (Preliminary result)   Collection Time: 05/17/16  6:45 PM  Result Value Ref Range Status   Specimen  Description BLOOD RIGHT HAND  Final   Special Requests BOTTLES DRAWN AEROBIC ONLY 5CC  Final   Culture   Final    NO GROWTH 3 DAYS Performed at [facility]    Report Status PENDING  Incomplete  Urine culture     Status: Abnormal (Preliminary result)   Collection Time: 05/17/16  6:50 PM  Result Value Ref Range Status   Specimen Description URINE, CLEAN CATCH  Final   Special Requests NONE  Final   Culture (A)  Final    >=100,000 COLONIES/mL METHICILLIN RESISTANT STAPHYLOCOCCUS AUREUS >=100,000 COLONIES/mL STREPTOCOCCUS SPECIES    Report Status PENDING  Incomplete   Organism ID, Bacteria METHICILLIN RESISTANT STAPHYLOCOCCUS AUREUS (A)  Final      Susceptibility   Methicillin resistant staphylococcus aureus - MIC*    CIPROFLOXACIN <=0.5 SENSITIVE Sensitive     GENTAMICIN <=0.5 SENSITIVE Sensitive     NITROFURANTOIN <=16 SENSITIVE Sensitive     OXACILLIN >=4 RESISTANT Resistant     TETRACYCLINE >=16 RESISTANT Resistant     VANCOMYCIN <=0.5 SENSITIVE Sensitive     TRIMETH/SULFA <=10 SENSITIVE Sensitive     CLINDAMYCIN >=8 RESISTANT Resistant     RIFAMPIN <=0.5 SENSITIVE Sensitive     Inducible Clindamycin NEGATIVE Sensitive     * >=100,000 COLONIES/mL METHICILLIN RESISTANT STAPHYLOCOCCUS AUREUS  Culture, blood (routine x 2)     Status: None (Preliminary result)   Collection Time: 05/19/16  3:51 PM  Result Value Ref Range Status   Specimen Description BLOOD RIGHT HAND  Final   Special Requests IN PEDIATRIC BOTTLE 2CC  Final   Culture  Setup Time   Final    GRAM NEGATIVE RODS AEROBIC BOTTLE ONLY Organism ID to follow CRITICAL RESULT CALLED TO, READ BACK BY AND VERIFIED WITH: CARON AMEND,PHARMD @0114  05/21/16 MKELLY    Culture CULTURE REINCUBATED FOR BETTER GROWTH  Final   Report Status PENDING  Incomplete  Blood Culture ID Panel (Reflexed)     Status:  None   Collection Time: 05/19/16  3:51 PM  Result Value Ref Range Status   Enterococcus species NOT DETECTED NOT  DETECTED Final   Vancomycin resistance NOT DETECTED NOT DETECTED Final   Listeria monocytogenes NOT DETECTED NOT DETECTED Final   Staphylococcus species NOT DETECTED NOT DETECTED Final   Staphylococcus aureus NOT DETECTED NOT DETECTED Final   Methicillin resistance NOT DETECTED NOT DETECTED Final   Streptococcus species NOT DETECTED NOT DETECTED Final   Streptococcus agalactiae NOT DETECTED NOT DETECTED Final   Streptococcus pneumoniae NOT DETECTED NOT DETECTED Final   Streptococcus pyogenes NOT DETECTED NOT DETECTED Final   Acinetobacter baumannii NOT DETECTED NOT DETECTED Final   Enterobacteriaceae species NOT DETECTED NOT DETECTED Final   Enterobacter cloacae complex NOT DETECTED NOT DETECTED Final   Escherichia coli NOT DETECTED NOT DETECTED Final   Klebsiella oxytoca NOT DETECTED NOT DETECTED Final   Klebsiella pneumoniae NOT DETECTED NOT DETECTED Final   Proteus species NOT DETECTED NOT DETECTED Final   Serratia marcescens NOT DETECTED NOT DETECTED Final   Carbapenem resistance NOT DETECTED NOT DETECTED Final   Haemophilus influenzae NOT DETECTED NOT DETECTED Final   Neisseria meningitidis NOT DETECTED NOT DETECTED Final   Pseudomonas aeruginosa NOT DETECTED NOT DETECTED Final   Candida albicans NOT DETECTED NOT DETECTED Final   Candida glabrata NOT DETECTED NOT DETECTED Final   Candida krusei NOT DETECTED NOT DETECTED Final   Candida parapsilosis NOT DETECTED NOT DETECTED Final   Candida tropicalis NOT DETECTED NOT DETECTED Final  Culture, blood (routine x 2)     Status: None (Preliminary result)   Collection Time: 05/19/16  6:40 PM  Result Value Ref Range Status   Specimen Description BLOOD RIGHT HAND  Final   Special Requests BOTTLES DRAWN AEROBIC ONLY 5CC  Final   Culture NO GROWTH 2 DAYS  Final   Report Status PENDING  Incomplete     ASSESSMENT: He has recurrent right lower extremity cellulitis with transient MRSA bacteremia. He has no evidence of endocarditis. He  needs a more days of vancomycin. I suspect that the gram-negative rod grown in one set of his most recent cultures as an insignificant contaminant. Hopefully we will have an identification in the morning.  PLAN: 1. Continue current antibiotics pending final blood culture results  Michel Bickers, MD [facility] for [place] [phone] pager   [phone] cell 05/22/2016, 2:49 PM

## 2016-05-22 NOTE — Progress Notes (Signed)
Pt states that he will apply CPAP when he finishes speaking on the phone with family member.  RT checked CPAP and ensured that water chamber was full. RT will continue to monitor.

## 2016-05-22 NOTE — Progress Notes (Signed)
OT Cancellation Note  Patient Details Name: Matthew [name] MRN: [id number] DOB: 1956/01/21   Cancelled Treatment:    Reason Eval/Treat Not Completed: Patient declined, no reason specified. Pt asleep in bed upon OT arrival. Pt sleepy but able to converse. Pt repeatedly declining any OOB stating that he has not slept well and was just getting to a point where he could sleep. Explained importance/role of OT evaluation. Pt request OT reattempt tomorrow.  Hortencia Pilar 05/22/2016, 11:20 AM

## 2016-05-23 ENCOUNTER — Inpatient Hospital Stay (HOSPITAL_COMMUNITY): Payer: Medicare Other

## 2016-05-23 DIAGNOSIS — R0602 Shortness of breath: Secondary | ICD-10-CM

## 2016-05-23 DIAGNOSIS — J90 Pleural effusion, not elsewhere classified: Secondary | ICD-10-CM

## 2016-05-23 DIAGNOSIS — Z9889 Other specified postprocedural states: Secondary | ICD-10-CM

## 2016-05-23 LAB — GLUCOSE, CAPILLARY
GLUCOSE-CAPILLARY: 99 mg/dL (ref 65–99)
GLUCOSE-CAPILLARY: 100 mg/dL — AB (ref 65–99)
Glucose-Capillary: 125 mg/dL — ABNORMAL HIGH (ref 65–99)
Glucose-Capillary: 161 mg/dL — ABNORMAL HIGH (ref 65–99)

## 2016-05-23 LAB — BASIC METABOLIC PANEL
Anion gap: 9 (ref 5–15)
BUN: 26 mg/dL — ABNORMAL HIGH (ref 6–20)
CALCIUM: 8.9 mg/dL (ref 8.9–10.3)
CO2: 26 mmol/L (ref 22–32)
CREATININE: 1.57 mg/dL — AB (ref 0.61–1.24)
Chloride: 100 mmol/L — ABNORMAL LOW (ref 101–111)
GFR calc non Af Amer: 47 mL/min — ABNORMAL LOW (ref >60)
GFR, EST AFRICAN AMERICAN: 54 mL/min — AB (ref >60)
Glucose, Bld: 124 mg/dL — ABNORMAL HIGH (ref 65–99)
Potassium: 5 mmol/L (ref 3.5–5.1)
SODIUM: 135 mmol/L (ref 135–145)

## 2016-05-23 LAB — CBC
HEMATOCRIT: 45 % (ref 39.0–52.0)
Hemoglobin: 14.9 g/dL (ref 13.0–17.0)
MCH: 32.2 pg (ref 26.0–34.0)
MCHC: 33.1 g/dL (ref 30.0–36.0)
MCV: 97.2 fL (ref 78.0–100.0)
Platelets: 428 10*3/uL — ABNORMAL HIGH (ref 150–400)
RBC: 4.63 MIL/uL (ref 4.22–5.81)
RDW: 16.5 % — AB (ref 11.5–15.5)
WBC: 15.5 10*3/uL — ABNORMAL HIGH (ref 4.0–10.5)

## 2016-05-23 LAB — BODY FLUID CELL COUNT WITH DIFFERENTIAL
LYMPHS FL: 91 %
MONOCYTE-MACROPHAGE-SEROUS FLUID: 6 % — AB (ref 50–90)
NEUTROPHIL FLUID: 3 % (ref 0–25)
Total Nucleated Cell Count, Fluid: 1041 cu mm — ABNORMAL HIGH (ref 0–1000)

## 2016-05-23 LAB — CULTURE, BLOOD (ROUTINE X 2): Culture: NO GROWTH

## 2016-05-23 LAB — LACTATE DEHYDROGENASE, PLEURAL OR PERITONEAL FLUID: LD FL: 138 U/L — AB (ref 3–23)

## 2016-05-23 LAB — GLUCOSE, SEROUS FLUID: GLUCOSE FL: 133 mg/dL

## 2016-05-23 LAB — PROTEIN, BODY FLUID: Total protein, fluid: 3.3 g/dL

## 2016-05-23 LAB — VANCOMYCIN, TROUGH: Vancomycin Tr: 23 ug/mL (ref 15–20)

## 2016-05-23 LAB — LACTATE DEHYDROGENASE: LDH: 310 U/L — ABNORMAL HIGH (ref 98–192)

## 2016-05-23 MED ORDER — SODIUM CHLORIDE 0.9 % IV SOLN
1500.0000 mg | INTRAVENOUS | Status: DC
  Administered 2016-05-23 – 2016-05-27 (×5): 1500 mg via INTRAVENOUS
  Filled 2016-05-23 (×5): qty 1500

## 2016-05-23 MED ORDER — TRAMADOL HCL 50 MG PO TABS
50.0000 mg | ORAL_TABLET | Freq: Two times a day (BID) | ORAL | Status: DC | PRN
  Administered 2016-05-23 – 2016-05-26 (×3): 50 mg via ORAL
  Filled 2016-05-23 (×3): qty 1

## 2016-05-23 MED ORDER — FUROSEMIDE 10 MG/ML IJ SOLN
20.0000 mg | Freq: Once | INTRAMUSCULAR | Status: AC
  Administered 2016-05-23: 20 mg via INTRAVENOUS
  Filled 2016-05-23: qty 2

## 2016-05-23 MED ORDER — LIDOCAINE HCL (PF) 1 % IJ SOLN
INTRAMUSCULAR | Status: AC
  Filled 2016-05-23: qty 10

## 2016-05-23 NOTE — Progress Notes (Addendum)
Nutrition Brief Note  Follow-up with patient today to assess apprehension from yesterday's education and to drop off sample low sodium menus. Pt about to be transported at time of visit. RD dropped off sample menus and asked patient if he any any questions about low sodium and carb modified diet discussed yesterday. Pt denies any questions and states that the difficulty thing will be applying the information to figure out food and real meals he can eat. RD encouraged patient to use the sample menus for ideas. RD name and contact information provided.  Pt would likely benefit from outpatient nutrition counseling.   If additional nutrition issues arise, please re-consult RD.   Scarlette Ar RD, LDN, CSP Inpatient Clinical Dietitian Pager: [phone] After Hours Pager: [phone]

## 2016-05-23 NOTE — Progress Notes (Signed)
Patient ID: Matthew [name], male   DOB: 1956-06-30, 60 y.o.   MRN: [id number]          [name] for Infectious Disease    Date of Admission:  05/17/2016   Day 7 vancomycin        Day 3 cefepime  The repeat blood culture that was reported to be growing gram-negative rods actually turned out to be an insignificant Bacillus species. Cefepime can be stopped now. He needs one more week of vancomycin for his transient MRSA bacteremia secondary to right leg cellulitis. I will sign off now but please call if I can be of further assistance.         Michel Bickers, MD [facility] for Infectious [place] [facility] [phone] pager   [phone] cell 10/02/2015, 1:32 PM

## 2016-05-23 NOTE — Progress Notes (Signed)
Occupational Therapy Evaluation Patient Details Name: Matthew [name] MRN: [id number] DOB: Oct 27, 1955 Today's Date: 05/23/2016    History of Present Illness 60 year old male presenting with sudden onset of chills day of admission. Known morbid obesity, type 2 diabetes, alcohol abuse, alcoholic cirrhosis. Presented emergency department. Found to have right lower extremity cellulitis. +fluid overload   Clinical Impression   PTA, pt independent with mobility and lived alone. States ADL and IADL tasks are difficult for him due to his size. Evaluation limited this am due to pt having difficulty staying awake. Will follow up and educate pt on AE and compensatory techniques for ADL and IADL tasks. Recommend HHOT to follow up within the home to further assess IADL tasks and maximize his functional level of independence.    Follow Up Recommendations  Home health OT    Equipment Recommendations  Tub/shower seat;Other (comment) (bariatric)    Recommendations for Other Services       Precautions / Restrictions Precautions Precautions: Fall Restrictions Weight Bearing Restrictions: No      Mobility Bed Mobility                  Transfers Overall transfer level: Modified independent               General transfer comment: per PT. Not assessed due to lethargy - pt declined    Balance                                            ADL Overall ADL's : Needs assistance/impaired     Grooming: Set up;Sitting   Upper Body Bathing: Set up;Sitting   Lower Body Bathing: Minimal assistance;Sit to/from stand   Upper Body Dressing : Set up;Sitting   Lower Body Dressing: Minimal assistance;Sit to/from stand               Functional mobility during ADLs:  (mobility not assessed due to lethargy this am. per PT, mod I) General ADL Comments: Pt states he has more difficulty with LB ADL and would most likely benefit from AE. Pt also has concerns about how  to clean his bathrrom/tub shich is difficult for him to do      Vision     Perception     Praxis      Pertinent Vitals/Pain Pain Assessment: Faces Faces Pain Scale: Hurts little more Pain Location: LLE Pain Descriptors / Indicators: Discomfort Pain Intervention(s): Limited activity within patient's tolerance     Hand Dominance Right   Extremity/Trunk Assessment Upper Extremity Assessment Upper Extremity Assessment: Overall WFL for tasks assessed   Lower Extremity Assessment Lower Extremity Assessment: Defer to PT evaluation       Communication Communication Communication: No difficulties   Cognition Arousal/Alertness: Lethargic Behavior During Therapy: WFL for tasks assessed/performed Overall Cognitive Status: Within Functional Limits for tasks assessed                     General Comments       Exercises       Shoulder Instructions      Home Living Family/patient expects to be discharged to:: Private residence Living Arrangements: Alone   Type of Home: Apartment Home Access: Stairs to enter Entrance Stairs-Number of Steps: 1 Entrance Stairs-Rails: None Home Layout: One level     Bathroom Shower/Tub: Tub/shower unit;Curtain Shower/tub characteristics: Architectural technologist: Handicapped height Bathroom  Accessibility: Yes How Accessible: Accessible via walker Home Equipment: Walker - 4 wheels;Grab bars - tub/shower          Prior Functioning/Environment Level of Independence: Needs assistance  Gait / Transfers Assistance Needed: walks independently; denies fallls; very cautious and feels a cane might be helpful ADL's / Homemaking Assistance Needed: needs assist to clean his tub/shower        OT Diagnosis: Generalized weakness;Acute pain   OT Problem List: Decreased range of motion;Decreased knowledge of use of DME or AE;Cardiopulmonary status limiting activity;Obesity;Pain;Increased edema   OT Treatment/Interventions: Self-care/ADL  training;DME and/or AE instruction;Energy conservation;Therapeutic activities;Patient/family education    OT Goals(Current goals can be found in the care plan section) Acute Rehab OT Goals Patient Stated Goal: be more safe with activities where he bends/leans forward (i.e. bathing, dressing) OT Goal Formulation: With patient Time For Goal Achievement: 06/06/16 Potential to Achieve Goals: Good  OT Frequency: Min 2X/week   Barriers to D/C:            Co-evaluation              End of Session Nurse Communication: Mobility status  Activity Tolerance: Patient limited by fatigue Patient left: in bed;with call bell/phone within reach   Time: 0927-0939 OT Time Calculation (min): 12 min Charges:  OT General Charges $OT Visit: 1 Procedure OT Evaluation $OT Eval Moderate Complexity: 1 Procedure G-Codes:    Albaro Deviney,HILLARY 06-16-16, 11:28 AM   Maurie Boettcher, OT/L  [phone] 2016-06-16

## 2016-05-23 NOTE — Progress Notes (Signed)
CPAP set up bedside by this RN for the patient. Pt stated he would put his CPAP on tonight. RN later  encouraged CPAP again after pt ambulated to the bathroom. Pt would not keep CPAP on for any extended amount of time. O2 sat initially low at 85% after another trip to bathroom. Patient placed on 2L [place] and O2 increased to 90%. Pt continues to be educated nightly about importance of CPAP use, however pt still mostly noncompliant. Will continue to monitor.

## 2016-05-23 NOTE — Progress Notes (Signed)
Family Medicine Teaching Service Daily Progress Note Intern Pager: [phone]  Patient name: Matthew [name] Medical record number: [id number] Date of birth: 04-22-1956 Age: 60 y.o. Gender: male  Primary Care Provider: Junie Panning, DO Consultants: ID Code Status: FULL  Pt Overview and Major Events to Date:  Overnight desat to 85 when walking to bathroom. Given 2L [place]. Sats 92 this am without O2.   Assessment and Plan: MRSA bacteremia, sepsis: Presented with feeling unwell and chills at home. Has been afebrile. In [facility] ED, tachypneic to 30, BP 111/73, HR 114. Lactate 3.55, WBC 23.9. Patient meeting sepsis criteria w/ suspected source of infection RLE cellulitis.Lactate decreased to 2.70with 1L bolus. Repeat lactate here 2.2>2.8>1.8. Vanc/zosyn given. UA with large leuks, no nitrites, no bacteria. CXR initially with vascular congestion, Small right pleural effusion noted. Borderline cardiomegaly. Increased interstitial markings may reflect mild interstitial edema. Most likely source of bacteremia is RLE cellulitis. Possible alternative sources include bilateral lateral midfoot eschars, urine (less likely), SBP, orpneumonia. APTT 33. Procalcitonin 2.43. INR 1.53. WBC increased to 31.3>22.6>15.3>15.3>15.5. Blood cultures growing MRSA. Repeat blood cultures pending. Repeat CXR showed stable R basilar infiltrate and associated effusion. Abdominal US showed moderate cirrhosis, no ascites.  -monitor urine cultures >MRSA -continue vanc, cefepime -Repeat BCx grew GNR in one of 3bottles > still awaiting speciation, continued cefepime -ID automatic consult for MRSA bactermia: ordered repeat BCx, do not place PICC until those are negative. Agree with TEE. Continued recs appreciated. -TEE showed no vegetations, normal EF -erythema stable -CXR showed large R pleural effusion (increased from previous), will consult IR for possible thoracentesis   Anasarca and alcoholic cirrhosis: Wt  0000000. Admit weight 415. Most recent weight in clinic 409.Recent weight increase in clinic led to increasing spiro and HCTZ.Also on Demadex outpatient. BNP >600 (baseline ~200). Last echo 01/2015 EF 0000000, normal systolic function, wall motion poorly visualized. Given IV lasix 60mg  once. Abdominal US moderate cirrhosis, no ascites. Echo EF 65%, mild basal septal LV hypertrophy, indeterminate diastolic function 2/2 afib.  -lasix 20mg  once given last pm; UOP increased. Pt desires additional dose today. Consider additional lasix 20mg  IV vs restarting home diuretics.  -Strict I/Os -Daily weights -holding home HCTZ, spiro, and torsemide  DMII: Diet controlled. Cover with SSI. Most recent A1C 6.0 in April. Received 4 units SSI over the past 24 hours. -repeat HgbA1c on admission = 6.9. Consider metformin as outpatient vs dietary changes. -continue home neurontin -sensitive sliding scale insulin -monitor CBGs  AKI: Cr baseline 1.08. Cr now 1.83>1.75>1.74>1.49>1.57. Additional increase likely 2/2 lasix dose yesterday. Myrtis Ser = 32%, likely prerenal 2/2 sepsis.  - continue to monitor  HTN: Home meds are HCTZ 12.5mg  daily.  -holding HCTZ as pt has been normotensive  Afib: Rate controlled on dilt, will continue.  -continue dilt w/ holding parameters SBP<110, DBP<60 -regarding anticoagulation, CHADsvasc score is 2> started ASA 81mg  EC  OSA: Uses CPAP at home.  -continue CPAP qhs  FEN/GI: heart healthy/carb moddiet, SSI Prophylaxis: lovenox  Disposition: continued management of sepsis, likely discharge with PICC in the next day or two  Subjective:  Pt does not feel well today. Would like continued diuresis. Endorses wearing CPAP overnight but RN notes suggest otherwise.   Objective: Temp:  [98.4 F (36.9 C)-98.5 F (36.9 C)] 98.4 F (36.9 C) (08/25 0520) Pulse Rate:  [77-86] 77 (08/25 0520) Resp:  [18] 18 (08/25 0520) BP: (105-137)/(46-71) 105/46 (08/25  0520) SpO2:  [90 %-91 %] 91 % (08/25 0520) Weight:  [198.2 kg (437  lb)] 198.2 kg (437 lb) (08/25 0407) Physical Exam: General: NAD, morbidly obese male, pleasant. ENTM: mucosa pink and moist Cardiovascular:difficult to auscultate 2/2 body habitus but sounds like anirregularly irregular rhythm, no m/r/g Respiratory: decreased air movement over right base, difficult to ausculate 2/2 body habitus Abdomen: large pannus w/ marked thickening of skin on the inferior aspect, SNT, umbilical hernia not reducible and nontender Extremities: 2+ BLE, skin darkening consistent with chronic venous stasis, R leg erythematous and warm up to lateral knee. Nonpurulent.  Laboratory:  Recent Labs Lab 05/21/16 0311 05/22/16 0514 05/23/16 0807  WBC 15.3* 15.0* 15.5*  HGB 15.2 14.1 14.9  HCT 45.7 43.3 45.0  PLT 406* 446* 428*    Recent Labs Lab 05/17/16 1840 05/18/16 0428  05/20/16 0337 05/21/16 0311 05/22/16 0514  NA 135 134*  < > 135 135 134*  K 3.6 3.8  < > 4.1 5.1 4.9  CL 91* 92*  < > 95* 101 102  CO2 33* 31  < > 31 25 24   BUN 18 23*  < > 32* 30* 27*  CREATININE 1.08 1.83*  < > 1.74* 1.56* 1.49*  CALCIUM 9.6 9.5  < > 8.9 8.8* 8.5*  PROT 8.7* 7.9  --   --   --   --   BILITOT 1.6* 2.1*  --   --   --   --   ALKPHOS 184* 174*  --   --   --   --   ALT 24 21  --   --   --   --   AST 40 41  --   --   --   --   GLUCOSE 172* 144*  < > 85 65 94  < > = values in this interval not displayed.  Imaging/Diagnostic Tests: Dg Chest 2 View  Result Date: 05/22/2016 CLINICAL DATA:  Shortness of breath.  Congestive heart failure. EXAM: CHEST  2 VIEW COMPARISON:  05/18/2016 FINDINGS: Large right pleural effusion with associated passive atelectasis. This obscures the right heart margin. Interstitial accentuation bilaterally with mildly indistinct pulmonary vasculature. This is minimally improved compared to the prior exam. No significant left pleural effusion. IMPRESSION: 1. Moderate to large right pleural  effusion with associated passive atelectasis. Underlying pneumonia difficult to exclude. 2. Abnormal interstitial accentuation, although slightly improved from prior, suspicious for pulmonary venous hypertension and potentially mild interstitial edema. Electronically Signed   By: Van Clines M.D.   On: 05/22/2016 18:45    Sela Hilding, MD 05/23/2016, 9:22 AM PGY-1, [place] Intern pager: [phone], text pages welcome

## 2016-05-23 NOTE — Progress Notes (Addendum)
Pharmacy Antibiotic Note  Matthew [name] is a 60 y.o. male admitted on 05/17/2016 with MRSA bacteremia and possible GNR bacteremia.  Pharmacy dosing Vancomycin (Day #7) and cefepime (day 2). ID on board with plans to treat for 2 weeks with vancomycin.  Cultures show bacillus and per ID cefepime can be stopped.  -VT= 23 at 6:30pm (last dose given at ~ 7pm on 8/24)  Plan:  Change vancomycin to 1500mg  IV Q24 Consider stopping cefepime Monitor clinical picture, renal function, recheck VT at new Css F/U C&S, LOT  Height: 5\' 10"  (177.8 cm) Weight: (!) 437 lb (198.2 kg) IBW/kg (Calculated) : 73  Temp (24hrs), Avg:98.1 F (36.7 C), Min:97.7 F (36.5 C), Max:98.5 F (36.9 C)   Recent Labs Lab 05/18/16 0428 05/18/16 0755 05/18/16 1507 05/18/16 1842 05/18/16 2207 05/19/16 0420 05/19/16 2202 05/20/16 [id number] 05/20/16 0921 05/21/16 0311 05/22/16 0514 05/23/16 0807 05/23/16 1829  WBC 31.3*  --   --   --   --  22.6*  --   --  15.3* 15.3* 15.0* 15.5*  --   CREATININE 1.83*  --   --   --   --  1.75*  --  1.74*  --  1.56* 1.49* 1.57*  --   LATICACIDVEN 2.2* 2.2* 2.8* 2.0* 1.8  --   --   --   --   --   --   --   --   VANCOTROUGH  --   --   --   --   --   --  31*  --   --   --   --   --  23*    Estimated Creatinine Clearance: 88.2 mL/min (by C-G formula based on SCr of 1.57 mg/dL).    Allergies  Allergen Reactions  . Potassium-Containing Compounds Other (See Comments)    Reaction:  Stomach pain   . Doxycycline Other (See Comments)    Reaction:  Right side pain   . Augmentin [Amoxicillin-Pot Clavulanate] Nausea Only and Other (See Comments)    Has patient had a PCN reaction causing immediate rash, facial/tongue/throat swelling, SOB or lightheadedness with hypotension: No Has patient had a PCN reaction causing severe rash involving mucus membranes or skin necrosis: No Has patient had a PCN reaction that required hospitalization No Has patient had a PCN reaction occurring within the last  10 years: Yes If all of the above answers are "NO", then may proceed with Cephalosporin use.  [name] Neomycin Rash    Antimicrobials this admission: 8/19 Zosyn >> 8/21 8/19 Vanc >> 8/23 Cefepime >>  Dose adjustments this admission: 8/21 VT = 31 on 1250mg  q12h. 1000 dose not given until 1500 - level drawn 7hr later. Est. half-life 19.8 hrs, Ke 0.035, Vd 133.84L ->0.7 L/kg. Change to 1750 q24h 8/25: VT= 23  Microbiology results: 8/21 BCx x2: 1/2 - bacillus species 8/19 BCx x2: 1/2 MRSA - cipro-S, gent-S, rifa-S, tetra-S, bactrim-S 8/19 UCx: MRSA - cipro-S, nitro-S, bactrim-S, gent-S  Thank you for allowing pharmacy to be a part of this patient's care.  Hildred Laser, Pharm D 05/23/2016 7:56 PM

## 2016-05-23 NOTE — Procedures (Signed)
Ultrasound-guided diagnostic and therapeutic right thoracentesis performed yielding 1.5liters of yellow colored fluid. No immediate complications. Follow-up chest x-ray pending.      Matthew [name] E 4:14 PM 05/23/2016

## 2016-05-24 DIAGNOSIS — K7030 Alcoholic cirrhosis of liver without ascites: Secondary | ICD-10-CM

## 2016-05-24 LAB — GLUCOSE, CAPILLARY
GLUCOSE-CAPILLARY: 94 mg/dL (ref 65–99)
GLUCOSE-CAPILLARY: 114 mg/dL — AB (ref 65–99)
Glucose-Capillary: 131 mg/dL — ABNORMAL HIGH (ref 65–99)
Glucose-Capillary: 110 mg/dL — ABNORMAL HIGH (ref 65–99)

## 2016-05-24 LAB — BASIC METABOLIC PANEL
Anion gap: 7 (ref 5–15)
BUN: 25 mg/dL — ABNORMAL HIGH (ref 6–20)
CALCIUM: 8.8 mg/dL — AB (ref 8.9–10.3)
CO2: 31 mmol/L (ref 22–32)
CREATININE: 1.61 mg/dL — AB (ref 0.61–1.24)
Chloride: 99 mmol/L — ABNORMAL LOW (ref 101–111)
GFR, EST AFRICAN AMERICAN: 52 mL/min — AB (ref >60)
GFR, EST NON AFRICAN AMERICAN: 45 mL/min — AB (ref >60)
GLUCOSE: 96 mg/dL (ref 65–99)
Potassium: 4.7 mmol/L (ref 3.5–5.1)
SODIUM: 137 mmol/L (ref 135–145)

## 2016-05-24 LAB — MISC LABCORP TEST (SEND OUT): LABCORP TEST CODE: 88062

## 2016-05-24 LAB — CULTURE, BLOOD (ROUTINE X 2): Culture: NO GROWTH

## 2016-05-24 LAB — TRIGLYCERIDES, BODY FLUIDS: Triglycerides, Fluid: 25 mg/dL

## 2016-05-24 LAB — CBC
HCT: 44.1 % (ref 39.0–52.0)
Hemoglobin: 14.3 g/dL (ref 13.0–17.0)
MCH: 31.8 pg (ref 26.0–34.0)
MCHC: 32.4 g/dL (ref 30.0–36.0)
MCV: 98 fL (ref 78.0–100.0)
PLATELETS: 456 10*3/uL — AB (ref 150–400)
RBC: 4.5 MIL/uL (ref 4.22–5.81)
RDW: 16.4 % — ABNORMAL HIGH (ref 11.5–15.5)
WBC: 16.4 10*3/uL — ABNORMAL HIGH (ref 4.0–10.5)

## 2016-05-24 LAB — GRAM STAIN

## 2016-05-24 LAB — PROTEIN, TOTAL: Total Protein: 7.1 g/dL (ref 6.5–8.1)

## 2016-05-24 LAB — CHOLESTEROL, TOTAL: Cholesterol: 114 mg/dL (ref 0–200)

## 2016-05-24 MED ORDER — PIPERACILLIN-TAZOBACTAM 3.375 G IVPB
3.3750 g | Freq: Three times a day (TID) | INTRAVENOUS | Status: DC
Start: 2016-05-24 — End: 2016-05-25
  Administered 2016-05-24 – 2016-05-25 (×3): 3.375 g via INTRAVENOUS
  Filled 2016-05-24 (×4): qty 50

## 2016-05-24 MED ORDER — TUBERCULIN PPD 5 UNIT/0.1ML ID SOLN
5.0000 [IU] | Freq: Once | INTRADERMAL | Status: AC
Start: 2016-05-24 — End: 2016-05-26
  Administered 2016-05-24: 5 [IU] via INTRADERMAL
  Filled 2016-05-24: qty 0.1

## 2016-05-24 MED ORDER — FUROSEMIDE 40 MG PO TABS
40.0000 mg | ORAL_TABLET | Freq: Every day | ORAL | Status: DC
  Administered 2016-05-24: 40 mg via ORAL
  Filled 2016-05-24: qty 1

## 2016-05-24 MED ORDER — FUROSEMIDE 40 MG PO TABS
40.0000 mg | ORAL_TABLET | Freq: Two times a day (BID) | ORAL | Status: DC
  Administered 2016-05-24 – 2016-05-25 (×2): 40 mg via ORAL
  Filled 2016-05-24 (×2): qty 1

## 2016-05-24 NOTE — Progress Notes (Signed)
Patient found to have fever of 102.2F. Touched base with ID who recommend LE dopplers to rule out DVT and Renal US since urine is growing MRSA.

## 2016-05-24 NOTE — Progress Notes (Addendum)
Attempted to obtain consent for PICC placement.  Pt drowsy, difficult to awaken, speech slurred.  States has had pain med and is sleepy.  Awakened to verbalize irritation regarding being awakened for this.  States he was not planning on being d/c home today and refusing PICC until tomorrow (Sunday). Refuses for [place] to call anyone else for consent. RN notified, states has had only tramadol. Will attempt again - RN to notify if pt becomes agreeable later today.

## 2016-05-24 NOTE — Progress Notes (Signed)
Family Medicine Teaching Service Daily Progress Note Intern Pager: [phone]  Patient name: Matthew [name] Medical record number: [id number] Date of birth: [birth year] Age: 60 y.o.: ad Gender: male  Primary Care Provider: Junie Panning, DO Consultants: ID Code Status: FULL  Pt Overview and Major Events to Date:  8/19:  Admit with sepsis 2/2 RLE cellulitis. Started on Vanc/Zosyn 8/21: Zosyn dc'ed due to MRSA bacteremia 8/23: Started on Cefepime 2/2 GNR in 1 bottle 8/25: IR US thoracentesis of R pleural effusion: 1.5L yellow fluid; stopped Cefepime per ID   Assessment and Plan: MRSA bacteremia, sepsis resolved: Afebrile. Leukocytosis 31.3>>5.3>15.5>16.4. - continue Vanc; needs total 2 weeks of therapy  - ID consulted:  Cefepime stopped due to insignificant bacillus species; continue  - Urine Culture: MRSA and Viridans Strep  - Repeat bood cx 8/21: 1 bot- bacillus species; 2nd bot- NG x 4 days  - TEE showed no vegetations, normal EF - will obtain PICC today  RLE Cellulitis:  - management as above  R Pleural Effusion s/p thoracentesis 8/25: Fluid studies with wbc 1,041 w/ 3% neutrophils, LD 138. No organisms seen on gram stain. Pleural fluid is exudative per calculations.  Discussed fluid studies with Dr. Nelda [name] from pulm who reviewed the labs and reports this is transudative fluid.  - cultures pending    Anasarca and alcoholic cirrhosis: Wt 0000000 > 436. Admit weight 415. Most recent weight in clinic 409.Recent weight increase in clinic led to increasing spiro and HCTZ.Also on Demadex outpatient. BNP >600 (baseline ~200). Last echo 01/2015 EF 0000000, normal systolic function, wall motion poorly visualized. Abdominal US moderate cirrhosis, no ascites. Echo EF 65%, mild basal septal LV hypertrophy, indeterminate diastolic function 2/2 afib. Received IV Lasx 20mg  x1 8/25. I/O 2.650 L out over 24 hours. Net -7.7L - transitioned to PO Lasix 40mg  daily 8/26 - Strict I/Os -  Daily weights - holding home HCTZ, spiro, and torsemide  DMII: Diet controlled. Cover with SSI. Most recent A1C 6.0 in April.  - repeat HgbA1c on admission = 6.9 (from 6.9). Consider metformin as outpatient vs dietary changes. - continue home neurontin - sensitive sliding scale insulin - monitor CBGs  AKI: Cr baseline 1.08. Cr now 1.83>1.75>1.74>1.49>1.57 > 1.61 . Additional increase likely 2/2 lasix dose yesterday. Matthew [name] = 32%, likely prerenal 2/2 sepsis.  - continue to monitor  HTN: Home meds are HCTZ 12.5mg  daily.  -holding HCTZ as pt has been normotensive  Afib: Rate controlled on dilt, will continue.  -continue dilt w/ holding parameters SBP<110, DBP<60 -regarding anticoagulation, CHADsvasc score is 2> started ASA 81mg  EC  OSA: Uses CPAP at home.  -continue CPAP qhs  Other:  - HHOT - PT: no PT follow up; interested in tub bench, cane and reacher.  - nutrition: would likely benefit from outpatient nutrition counseling.   FEN/GI: heart healthy/carb moddiet, SSI Prophylaxis: lovenox  Disposition: possible discharge in the next day or two.   Subjective:  IR guided thoracentesis 8/25 with 1.5 L of yellow fluid. Reports breathing is baseline. Is upset he is on a carb modified diet. Continues to mention he has a lot of fluid in his body and needs more than daily dose of diuretics.   Objective: Temp:  [97.7 F (36.5 C)-98 F (36.7 C)] 98 F (36.7 C) (08/26 0535) Pulse Rate:  [70-74] 70 (08/26 0535) Resp:  [20] 20 (08/25 1946) BP: (104-126)/(53-84) 126/53 (08/26 0535) SpO2:  [92 %-94 %] 94 % (08/26 0535) Weight:  [197.9 kg (436 lb 4.8  oz)] 197.9 kg (436 lb 4.8 oz) (08/26 0527) Physical Exam: General: NAD, morbidly obese male, pleasant. Cardiovascular:difficult to auscultate 2/2 body habitus but sounds like anirregularly irregular rhythm, no m/r/g Respiratory: on room air. decreased air movement over right base Abdomen: large pannus w/ marked thickening of  skin on the inferior aspect, SNT, umbilical hernia not reducible and nontender Extremities: 2+ BLE, skin darkening consistent with chronic venous stasis, R leg erythematous and warm up to lateral knee. Nonpurulent.      Laboratory:  Recent Labs Lab 05/22/16 0514 05/23/16 0807 05/24/16 0423  WBC 15.0* 15.5* 16.4*  HGB 14.1 14.9 14.3  HCT 43.3 45.0 44.1  PLT 446* 428* 456*    Recent Labs Lab 05/17/16 1840 05/18/16 0428  05/22/16 0514 05/23/16 0807 05/24/16 0423  NA 135 134*  < > 134* 135 137  K 3.6 3.8  < > 4.9 5.0 4.7  CL 91* 92*  < > 102 100* 99*  CO2 33* 31  < > 24 26 31   BUN 18 23*  < > 27* 26* 25*  CREATININE 1.08 1.83*  < > 1.49* 1.57* 1.61*  CALCIUM 9.6 9.5  < > 8.5* 8.9 8.8*  PROT 8.7* 7.9  --   --   --   --   BILITOT 1.6* 2.1*  --   --   --   --   ALKPHOS 184* 174*  --   --   --   --   ALT 24 21  --   --   --   --   AST 40 41  --   --   --   --   GLUCOSE 172* 144*  < > 94 124* 96  < > = values in this interval not displayed.  Blood Culture 8/21: 1 bottle with bacillus species; 2nd bottle NG x 4 days Pleural Fluid Studies:  Results for Matthew [name] (MRN [id number]) as of 05/24/2016 07:07  Ref. Range 05/23/2016 16:09  Glucose, Fluid Latest Units: mg/dL 133  Fluid Type-FGLU Unknown FLUID  Fluid Type-FLDH Unknown FLUID  LD, Fluid Latest Ref Range: 3 - 23 U/L 138 (H)  Total protein, fluid Latest Units: g/dL 3.3  Fluid Type-FCT Unknown FLUID  Fluid Type-FTP Unknown FLUID  Color, Fluid Latest Ref Range: YELLOW  YELLOW  WBC, Fluid Latest Ref Range: 0 - 1,000 cu mm 1,041 (H)  Lymphs, Fluid Latest Units: % 91  Appearance, Fluid Latest Ref Range: CLEAR  CLOUDY (A)  Neutrophil Count, Fluid Latest Ref Range: 0 - 25 % 3  Monocyte-Macrophage-Serous Fluid Latest Ref Range: 50 - 90 % 6 (L)  Pleural Fluid Culture: pending   Imaging/Diagnostic Tests: Dg Chest 1 View  Result Date: 05/23/2016 CLINICAL DATA:  Status post right thoracentesis. EXAM: CHEST 1 VIEW  COMPARISON:  05/22/2016 FINDINGS: Following thoracentesis, decreased fluid is evident on the right. There is hazy opacity that persists at the right lung base likely due to atelectasis and/or pneumonia with a small amount of residual fluid. No pneumothorax. Mild persistent atelectasis noted at the left lung base. IMPRESSION: 1. No pneumothorax or evidence of a complication following thoracentesis. 2. Decreased right pleural fluid.  No other change. Electronically Signed   By: Lajean Manes M.D.   On: 05/23/2016 16:36   US Thoracentesis Asp Pleural Space W/img Guide  Result Date: 05/23/2016 INDICATION: Patient admitted with shortness of breath and pneumonia. Request is made for diagnostic and therapeutic thoracentesis. EXAM: ULTRASOUND GUIDED DIAGNOSTIC AND THERAPEUTIC THORACENTESIS MEDICATIONS: 1% lidocaine  COMPLICATIONS: None immediate. PROCEDURE: An ultrasound guided thoracentesis was thoroughly discussed with the patient and questions answered. The benefits, risks, alternatives and complications were also discussed. The patient understands and wishes to proceed with the procedure. Written consent was obtained. Ultrasound was performed to localize and mark an adequate pocket of fluid in the right chest. The area was then prepped and draped in the normal sterile fashion. 1% Lidocaine was used for local anesthesia. Under ultrasound guidance a Safe-T-Centesis catheter was introduced. Thoracentesis was performed. The catheter was removed and a dressing applied. FINDINGS: A total of approximately 1.5 L of fluid was removed. Samples were sent to the laboratory as requested by the clinical team. IMPRESSION: Successful ultrasound guided right thoracentesis yielding 1.5 L of pleural fluid. Read by: Saverio Danker, PA-C Electronically Signed   By: Lucrezia Europe M.D.   On: 05/23/2016 16:18    Smiley Houseman, MD 05/24/2016, 7:03 AM PGY-2, [place] Intern pager: [phone], text pages  welcome

## 2016-05-25 ENCOUNTER — Inpatient Hospital Stay (HOSPITAL_COMMUNITY): Payer: Medicare Other

## 2016-05-25 DIAGNOSIS — M79609 Pain in unspecified limb: Secondary | ICD-10-CM

## 2016-05-25 DIAGNOSIS — R609 Edema, unspecified: Secondary | ICD-10-CM

## 2016-05-25 LAB — CBC
HEMATOCRIT: 44.2 % (ref 39.0–52.0)
HEMOGLOBIN: 14.4 g/dL (ref 13.0–17.0)
MCH: 31.6 pg (ref 26.0–34.0)
MCHC: 32.6 g/dL (ref 30.0–36.0)
MCV: 97.1 fL (ref 78.0–100.0)
PLATELETS: 474 10*3/uL — AB (ref 150–400)
RBC: 4.55 MIL/uL (ref 4.22–5.81)
RDW: 16.4 % — ABNORMAL HIGH (ref 11.5–15.5)
WBC: 15.1 10*3/uL — AB (ref 4.0–10.5)

## 2016-05-25 LAB — GLUCOSE, CAPILLARY
GLUCOSE-CAPILLARY: 100 mg/dL — AB (ref 65–99)
Glucose-Capillary: 93 mg/dL (ref 65–99)
Glucose-Capillary: 105 mg/dL — ABNORMAL HIGH (ref 65–99)
Glucose-Capillary: 112 mg/dL — ABNORMAL HIGH (ref 65–99)

## 2016-05-25 LAB — BASIC METABOLIC PANEL
ANION GAP: 4 — AB (ref 5–15)
BUN: 22 mg/dL — ABNORMAL HIGH (ref 6–20)
CALCIUM: 8.6 mg/dL — AB (ref 8.9–10.3)
CO2: 30 mmol/L (ref 22–32)
Chloride: 102 mmol/L (ref 101–111)
Creatinine, Ser: 1.35 mg/dL — ABNORMAL HIGH (ref 0.61–1.24)
GFR calc non Af Amer: 56 mL/min — ABNORMAL LOW (ref >60)
Glucose, Bld: 97 mg/dL (ref 65–99)
POTASSIUM: 4.8 mmol/L (ref 3.5–5.1)
Sodium: 136 mmol/L (ref 135–145)

## 2016-05-25 LAB — CREATININE, SERUM
Creatinine, Ser: 1.42 mg/dL — ABNORMAL HIGH (ref 0.61–1.24)
GFR, EST NON AFRICAN AMERICAN: 53 mL/min — AB (ref >60)

## 2016-05-25 MED ORDER — TORSEMIDE 20 MG PO TABS
40.0000 mg | ORAL_TABLET | Freq: Two times a day (BID) | ORAL | Status: DC
  Administered 2016-05-25 – 2016-05-27 (×5): 40 mg via ORAL
  Filled 2016-05-25 (×5): qty 2

## 2016-05-25 NOTE — Progress Notes (Signed)
Patient CPAP setup and ready to go besides O2 plugged into wall from [place]. Patient places himself on and off CPAP.

## 2016-05-25 NOTE — Progress Notes (Signed)
Pharmacy Antibiotic Note  Matthew [name] is a 60 y.o. male admitted on 05/17/2016 with MRSA bacteremia and possible GNR bacteremia.  Pharmacy dosing Vancomycin (Day #9 of 14).  Last trough 8/25   Plan:  Continue Vancomycin 1500 mg iv Q 24 hours to complete 14 days of therapy Continue to follow   Height: 5\' 10"  (177.8 cm) Weight: (!) 433 lb 1.6 oz (196.5 kg) (a scale) IBW/kg (Calculated) : 73  Temp (24hrs), Avg:99.3 F (37.4 C), Min:97.5 F (36.4 C), Max:102.2 F (39 C)   Recent Labs Lab 05/18/16 1507 05/18/16 1842 05/18/16 2207  05/19/16 2202  05/21/16 0311 05/22/16 0514 05/23/16 0807 05/23/16 1829 05/24/16 0423 05/25/16 0500 05/25/16 0604 05/25/16 0947  WBC  --   --   --   < >  --   < > 15.3* 15.0* 15.5*  --  16.4*  --  15.1*  --   CREATININE  --   --   --   < >  --   < > 1.56* 1.49* 1.57*  --  1.61* 1.42*  --  1.35*  LATICACIDVEN 2.8* 2.0* 1.8  --   --   --   --   --   --   --   --   --   --   --   VANCOTROUGH  --   --   --   --  31*  --   --   --   --  23*  --   --   --   --   < > = values in this interval not displayed.  Estimated Creatinine Clearance: 102 mL/min (by C-G formula based on SCr of 1.35 mg/dL).    Allergies  Allergen Reactions  . Potassium-Containing Compounds Other (See Comments)    Reaction:  Stomach pain   . Doxycycline Other (See Comments)    Reaction:  Right side pain   . Augmentin [Amoxicillin-Pot Clavulanate] Nausea Only and Other (See Comments)    Has patient had a PCN reaction causing immediate rash, facial/tongue/throat swelling, SOB or lightheadedness with hypotension: No Has patient had a PCN reaction causing severe rash involving mucus membranes or skin necrosis: No Has patient had a PCN reaction that required hospitalization No Has patient had a PCN reaction occurring within the last 10 years: Yes If all of the above answers are "NO", then may proceed with Cephalosporin use.  [name] Neomycin Rash    Antimicrobials this  admission: 8/19 Zosyn >> 8/21 8/19 Vanc >> 8/23 Cefepime >>8/25  Dose adjustments this admission: 8/21 VT = 31 on 1250mg  q12h. 1000 dose not given until 1500 - level drawn 7hr later. Est. half-life 19.8 hrs, Ke 0.035, Vd 133.84L ->0.7 L/kg. Change to 1750 q24h 8/25: VT= 23  Microbiology results: 8/21 BCx x2: 1/2 - bacillus species 8/19 BCx x2: 1/2 MRSA - cipro-S, gent-S, rifa-S, tetra-S, bactrim-S 8/19 UCx: MRSA - cipro-S, nitro-S, bactrim-S, gent-S  Thank you  Anette Guarneri, PharmD [phone]   05/25/2016 11:20 AM

## 2016-05-25 NOTE — Progress Notes (Signed)
Family Medicine Teaching Service Daily Progress Note Intern Pager: [phone]  Patient name: Matthew [name] Medical record number: [id number] Date of birth: 1955/12/21 Age: 60 y.o.: ad Gender: male  Primary Care Provider: Junie Panning, DO Consultants: ID Code Status: FULL  Pt Overview and Major Events to Date:  8/19:  Admit with sepsis 2/2 RLE cellulitis. Started on Vanc/Zosyn 8/20: TEE negative for vegetations, normal EF 8/21: Zosyn dc'ed due to MRSA bacteremia 8/23: Started on Cefepime 2/2 GNR in 1 bottle 8/25: IR US thoracentesis of R pleural effusion: 1.5L yellow fluid; stopped Cefepime per ID  8/26: Fever to 102.33F > started on Zosyn. 8/27: dc Zosyn per ID reccs  Assessment and Plan: MRSA bacteremia, sepsis resolved: Afebrile. Leukocytosis 31.3>>>15.5>16.4>15.1. Febrile 8/26 to 102.33F and therefore started on Zosyn. - continue Vanc; needs total 2 weeks of therapy (day 9/14) - restarted Zosyn 8/26 due to fever > stopped 8/27 AM per ID reccs noted below - ID consulted: signed off 8/25, but spoke over the phone to inform of fever; recommended LE dopplers to rule out DVT and a Renal US, recommended stopping Zosyn if patient is hemodynamically stable. - Urine Culture: MRSA and Viridans Strep  - Pleural fluid Culture: NG < 24 hours - Repeat bood cx 8/21: 1 bot- bacillus species; 2nd bot- NG x 4 days - Repeat blood cx 8/26: pending - will have to wait for PICC until blood cx from 8/26 return.   - LE dopplers bilateral - Renal US  RLE Cellulitis:  - management as above  R Pleural Effusion s/p thoracentesis 8/25: Fluid studies with wbc 1,041 w/ 3% neutrophils, LD 138. No organisms seen on gram stain. Discussed with pulm (Dr. Nelda Marseille) who reports studies consistent with transudative fluid and no further workup needed. LDH ratio 0.46, Protein 0.44, pleural LDH > 2/3 ULN of serum LDH   - cultures: NG < 24 hours  - PPD (ordered prior to discussion with pulm)   Anasarca and alcoholic  cirrhosis: Wt 0000000 > 436 > 433. Admit weight 415. Most recent weight in clinic 409.Recent weight increase in clinic led to increasing spiro and HCTZ.Also on Demadex outpatient. BNP >600 on admit (baseline ~200).  Echo EF 65%, mild basal septal LV hypertrophy, indeterminate diastolic function 2/2 afib. Received IV Lasx 20mg  x1 8/25. I/O 2.7 L out over 24 hours. Net -8.39L - PO Lasix 40mg  BID (8/26 >) - BMP pending - Strict I/Os - Daily weights - holding home HCTZ, spiro, and torsemide  DMII: Diet controlled. Cover with SSI. Most recent A1C 6.0 in April.  - repeat HgbA1c on admission = 6.9 (from 6.9). Consider metformin as outpatient vs dietary changes. - continue home neurontin - sensitive sliding scale insulin - monitor CBGs  AKI, improving: Cr baseline 1.08. Cr 1.83>>>1.49>1.57 > 1.61>1.42. - continue to monitor  HTN: Home meds are HCTZ 12.5mg  daily.  -holding HCTZ as pt has been normotensive  Afib: Rate controlled on dilt, will continue.  -continue dilt w/ holding parameters SBP<110, DBP<60 -regarding anticoagulation, CHADsvasc score is 2> started ASA 81mg  EC  OSA: Uses CPAP at home.  -continue CPAP qhs  Other:  - HHOT: HH OT (ordered) - PT: no PT follow up; interested in tub bench, cane and reacher.  - nutrition: would likely benefit from outpatient nutrition counseling.   FEN/GI: heart healthy (patient refuses carb modified diet), SSI Prophylaxis: lovenox  Disposition: pending further evaluation of bacteremia   Subjective:  Had fever of 102.33F yesterday afternoon and therefore added Zosyn. Breathing  improved but not at baseline. Desires to be on a non-carb modified diet.   Objective: Temp:  [97.5 F (36.4 C)-102.2 F (39 C)] 98.2 F (36.8 C) (08/27 0406) Pulse Rate:  [65-75] 65 (08/27 0406) Resp:  [18] 18 (08/27 0406) BP: (100-121)/(46-56) 106/46 (08/27 0406) SpO2:  [92 %-95 %] 94 % (08/27 0406) Weight:  [196.5 kg (433 lb 1.6 oz)] 196.5 kg (433 lb 1.6  oz) (08/27 0406) Physical Exam: General: NAD, morbidly obese male, pleasant. Cardiovascular:difficult to auscultate 2/2 body habitus but sounds like anirregularly irregular rhythm, no m/r/g Respiratory: on room air. decreased air movement over right base. No increased work of breathing Abdomen: large pannus w/ marked thickening of skin on the inferior aspect, SNT, umbilical hernia not reducible and nontender Extremities: 2+ BLE, skin darkening consistent with chronic venous stasis, R leg erythematous and warm up to lateral knee. Nonpurulent.       Laboratory:  Recent Labs Lab 05/22/16 0514 05/23/16 0807 05/24/16 0423  WBC 15.0* 15.5* 16.4*  HGB 14.1 14.9 14.3  HCT 43.3 45.0 44.1  PLT 446* 428* 456*    Recent Labs Lab 05/22/16 0514 05/23/16 0807 05/24/16 0423 05/24/16 0906 05/25/16 0500  NA 134* 135 137  --   --   K 4.9 5.0 4.7  --   --   CL 102 100* 99*  --   --   CO2 24 26 31   --   --   BUN 27* 26* 25*  --   --   CREATININE 1.49* 1.57* 1.61*  --  1.42*  CALCIUM 8.5* 8.9 8.8*  --   --   PROT  --   --   --  7.1  --   GLUCOSE 94 124* 96  --   --     Blood Culture 8/21: 1 bottle with bacillus species; 2nd bottle NG x 4 days Pleural Fluid Studies:  Results for Matthew, [name] (MRN [id number]) as of 05/24/2016 07:07  Ref. Range 05/23/2016 16:09  Glucose, Fluid Latest Units: mg/dL 133  Fluid Type-FGLU Unknown FLUID  Fluid Type-FLDH Unknown FLUID  LD, Fluid Latest Ref Range: 3 - 23 U/L 138 (H)  Total protein, fluid Latest Units: g/dL 3.3  Fluid Type-FCT Unknown FLUID  Fluid Type-FTP Unknown FLUID  Color, Fluid Latest Ref Range: YELLOW  YELLOW  WBC, Fluid Latest Ref Range: 0 - 1,000 cu mm 1,041 (H)  Lymphs, Fluid Latest Units: % 91  Appearance, Fluid Latest Ref Range: CLEAR  CLOUDY (A)  Neutrophil Count, Fluid Latest Ref Range: 0 - 25 % 3  Monocyte-Macrophage-Serous Fluid Latest Ref Range: 50 - 90 % 6 (L)  Pleural Fluid Culture: pending   Imaging/Diagnostic  Tests: No results found.  Smiley Houseman, MD 05/25/2016, 7:01 AM PGY-2, [place] Intern pager: [phone], text pages welcome

## 2016-05-25 NOTE — Progress Notes (Signed)
VASCULAR LAB PRELIMINARY  PRELIMINARY  PRELIMINARY  PRELIMINARY  Bilateral lower extremity venous duplex completed.    Preliminary report:  There is no obvious evidence of DVT or SVT noted in the visualized veins of the bilateral lower extremities.  This was a technically difficult and limited study secondary to patient's body habitus and inability to stay awake to follow instructions.   Stedman [place], RVT 05/25/2016, 10:00 AM

## 2016-05-25 NOTE — Progress Notes (Signed)
Occupational Therapy Treatment Patient Details Name: Matthew [name] MRN: [id number] DOB: 16-Jan-1956 Today's Date: 05/25/2016    History of present illness 60 year old male presenting with sudden onset of chills day of admission. Known morbid obesity, type 2 diabetes, alcohol abuse, alcoholic cirrhosis. Presented emergency department. Found to have right lower extremity cellulitis. +fluid overload   OT comments  Provided education and answered pt. Questions regarding A/E and DME needs.  Pt. Declined oob/eob this day due to fatigue.   Will continue to follow acutely.    Follow Up Recommendations       Equipment Recommendations  Tub/shower seat;Other (comment) (bariatric, pt. wants to know his out of pocket for this item)    Recommendations for Other Services      Precautions / Restrictions Precautions Precautions: Fall Restrictions Weight Bearing Restrictions: No       Mobility Bed Mobility                  Transfers                      Balance                                   ADL Overall ADL's : Needs assistance/impaired               Lower Body Bathing Details (indicate cue type and reason): discussed use of LH sponge in conjunction with converting shower head to hand held shower wand for ease with LB bathing-pt. states son to purchase A/E       Lower Body Dressing Details (indicate cue type and reason): pt. reports needing new A/E for LB dressing.  reviewed purchase in gift shop        Toileting - Clothing Manipulation Details (indicate cue type and reason): pt. declined and denied need for A/E for pericare. states he has been using b.room and cleaning himself without difficulty   Tub/Shower Transfer Details (indicate cue type and reason): reviewed DME options. pt. interested in bench. wants "an estimate".  disucssed if able to borrow would need to ensure it was a bariatric size to be safe for use   General ADL Comments: pt.  declined eob or oob secondary to "being tired from multiple tests".  agreeable to education and review of A/E and DME.  also states he has decided to look into hiring a housekeeper to aide with cleaning b.rooms and tub as he is concerned for how he will complete these tasks.  encouraged oob after rest, esp. for meals.  pt. verbalized understanding.        Vision                     Perception     Praxis      Cognition   Behavior During Therapy: WFL for tasks assessed/performed Overall Cognitive Status: Within Functional Limits for tasks assessed                       Extremity/Trunk Assessment               Exercises     Shoulder Instructions       General Comments      Pertinent Vitals/ Pain       Pain Assessment: No/denies pain  Home Living  Prior Functioning/Environment              Frequency Min 2X/week     Progress Toward Goals  OT Goals(current goals can now be found in the care plan section)  Progress towards OT goals: Progressing toward goals     Plan Discharge plan remains appropriate    Co-evaluation                 End of Session     Activity Tolerance Patient tolerated treatment well   Patient Left in bed;with call bell/phone within reach   Nurse Communication          Time: 1030-1041 OT Time Calculation (min): 11 min  Charges: OT General Charges $OT Visit: 1 Procedure OT Treatments $Self Care/Home Management : 8-22 mins  Janice Coffin, COTA/L 05/25/2016, 10:59 AM

## 2016-05-26 ENCOUNTER — Inpatient Hospital Stay (HOSPITAL_COMMUNITY): Payer: Medicare Other

## 2016-05-26 DIAGNOSIS — F331 Major depressive disorder, recurrent, moderate: Secondary | ICD-10-CM

## 2016-05-26 DIAGNOSIS — J189 Pneumonia, unspecified organism: Secondary | ICD-10-CM

## 2016-05-26 DIAGNOSIS — I10 Essential (primary) hypertension: Secondary | ICD-10-CM

## 2016-05-26 LAB — CBC
HEMATOCRIT: 44.4 % (ref 39.0–52.0)
Hemoglobin: 15 g/dL (ref 13.0–17.0)
MCH: 32.5 pg (ref 26.0–34.0)
MCHC: 33.8 g/dL (ref 30.0–36.0)
MCV: 96.3 fL (ref 78.0–100.0)
PLATELETS: 447 10*3/uL — AB (ref 150–400)
RBC: 4.61 MIL/uL (ref 4.22–5.81)
RDW: 16.4 % — ABNORMAL HIGH (ref 11.5–15.5)
WBC: 16.6 10*3/uL — ABNORMAL HIGH (ref 4.0–10.5)

## 2016-05-26 LAB — GLUCOSE, CAPILLARY
GLUCOSE-CAPILLARY: 99 mg/dL (ref 65–99)
GLUCOSE-CAPILLARY: 103 mg/dL — AB (ref 65–99)
GLUCOSE-CAPILLARY: 107 mg/dL — AB (ref 65–99)
Glucose-Capillary: 101 mg/dL — ABNORMAL HIGH (ref 65–99)

## 2016-05-26 LAB — COMPREHENSIVE METABOLIC PANEL
ALBUMIN: 2.2 g/dL — AB (ref 3.5–5.0)
ALT: 23 U/L (ref 17–63)
AST: 33 U/L (ref 15–41)
Alkaline Phosphatase: 146 U/L — ABNORMAL HIGH (ref 38–126)
Anion gap: 9 (ref 5–15)
BILIRUBIN TOTAL: 1.3 mg/dL — AB (ref 0.3–1.2)
BUN: 23 mg/dL — AB (ref 6–20)
CHLORIDE: 100 mmol/L — AB (ref 101–111)
CO2: 29 mmol/L (ref 22–32)
CREATININE: 1.48 mg/dL — AB (ref 0.61–1.24)
Calcium: 8.9 mg/dL (ref 8.9–10.3)
GFR calc Af Amer: 58 mL/min — ABNORMAL LOW (ref >60)
GFR, EST NON AFRICAN AMERICAN: 50 mL/min — AB (ref >60)
GLUCOSE: 92 mg/dL (ref 65–99)
Potassium: 4.2 mmol/L (ref 3.5–5.1)
Sodium: 138 mmol/L (ref 135–145)
TOTAL PROTEIN: 7.6 g/dL (ref 6.5–8.1)

## 2016-05-26 LAB — PATHOLOGIST SMEAR REVIEW

## 2016-05-26 LAB — PH, BODY FLUID: pH, Body Fluid: 7.8

## 2016-05-26 MED ORDER — SODIUM CHLORIDE 0.9% FLUSH: 10.0000 mL | INTRAVENOUS | Status: DC | PRN

## 2016-05-26 NOTE — Progress Notes (Signed)
Peripherally Inserted Central Catheter/Midline Placement  The IV Nurse has discussed with the patient and/or persons authorized to consent for the patient, the purpose of this procedure and the potential benefits and risks involved with this procedure.  The benefits include less needle sticks, lab draws from the catheter and patient may be discharged home with the catheter.  Risks include, but not limited to, infection, bleeding, blood clot (thrombus formation), and puncture of an artery; nerve damage and irregular heat beat.  Alternatives to this procedure were also discussed.  PICC/Midline Placement Documentation        Matthew [name], Nicolette Bang 05/26/2016, 12:36 PM

## 2016-05-26 NOTE — Discharge Summary (Signed)
[facility] Discharge Summary  Patient name: Matthew [name] Medical record number: [id number] Date of birth: Nov 21, 1955 Age: 60 y.o. Gender: male Date of Admission: 05/17/2016  Date of Discharge: 05/28/16 Admitting Physician: Alveda Reasons, MD  Primary Care Provider: Junie Panning, DO Consultants: ID  Indication for Hospitalization: sepsis secondary to right lower extremity cellulitis  Discharge Diagnoses/Problem List:  MRSA bacteremia, continuing treatment Right lower extremity cellulitis, resolving ESLD Hypertension Diabetes mellitus type 2 Afib GERD  Disposition: Home  Discharge Condition: Stable  Discharge Exam:  General: NAD, morbidly obese male, pleasant. Cardiovascular:difficult to auscultate 2/2 body habitus but sounds like anirregularly irregular rhythm, no m/r/g Respiratory: on room air. decreased air movement over right base. No increased work of breathing Abdomen: large pannus w/ marked thickening of skin on the inferior aspect, SNT, umbilical hernia not reducible and nontender Extremities: 2+ BLE, skin darkening consistent with chronic venous stasis, R leg erythematous and warm up to lateral knee. Nonpurulent.    Brief Hospital Course:  Patient presented to [facility] ED with chills, found to have RLE cellulitis. Lactic acid was elevated, responded to fluid bolus. Started on vanc and zosyn and transferred to [facility]. Found to have MRSA bacteremia and MRSA in urine. ID consulted, repeat blood cultures grew Bacillus species, thought to be contaminant. TEE negative for vegetations, normal EF. Secondary to receiving fluids due to hypotension in the setting of sepsis, pt became fluid up. History of R pleural effusion thought to be secondary to ESLD. On repeat CXR, pleural effusion had reaccumulated. IR consulted and drained 1.5L of transudative fluid. Pt tolerated well and noticed decrease in SOB. Diuresis resumed with 20mg  of IV lasix x3 doses, then  transitioned to 40mg  BID of torsemide with good response. Weight down to most recent dry weight on discharge. Resumed ASA for afib with ChadsVasc 2. Adding metformin on discharge for HgA1C of 6.9. Nutrition offered counseling for healthy diet and weight loss.   Issues for Follow Up:  1. Continue vancomycin via PICC until 06/02/16. 2. Monitor diuresis. Pt discharged on torsemide 40mg  BID after adequate diuresis, but was on 100mg  BID prior to admission.  3. Continue to counsel on healthy diet.   Significant Procedures: R thoracentesis - 1.5L transudative fluid, tolerated well  Significant Labs and Imaging:   Recent Labs Lab 05/25/16 0604 05/26/16 1638 05/27/16 0435  WBC 15.1* 16.6* 14.8*  HGB 14.4 15.0 13.7  HCT 44.2 44.4 40.3  PLT 474* 447* 454*    Recent Labs Lab 05/23/16 0807 05/24/16 0423 05/25/16 0500 05/25/16 0947 05/26/16 0519 05/27/16 0435  NA 135 137  --  136 138 137  K 5.0 4.7  --  4.8 4.2 3.6  CL 100* 99*  --  102 100* 97*  CO2 26 31  --  30 29 33*  GLUCOSE 124* 96  --  97 92 88  BUN 26* 25*  --  22* 23* 24*  CREATININE 1.57* 1.61* 1.42* 1.35* 1.48* 1.51*  CALCIUM 8.9 8.8*  --  8.6* 8.9 8.8*  ALKPHOS  --   --   --   --  146*  --   AST  --   --   --   --  33  --   ALT  --   --   --   --  23  --   ALBUMIN  --   --   --   --  2.2*  --     Results/Tests Pending at Time of  Discharge: none  Discharge Medications:    Medication List    STOP taking these medications   hydrochlorothiazide 12.5 MG tablet Commonly known as:  HYDRODIURIL     TAKE these medications   aspirin 81 MG EC tablet Take 1 tablet (81 mg total) by mouth daily.   diltiazem 180 MG 24 hr capsule Commonly known as:  CARDIZEM CD Take 180 mg by mouth daily.   gabapentin 100 MG capsule Commonly known as:  NEURONTIN Take 100 mg by mouth 3 (three) times daily.   metFORMIN 1000 MG (MOD) 24 hr tablet Commonly known as:  GLUMETZA Take 1 tablet (1,000 mg total) by mouth daily with breakfast.    spironolactone 100 MG tablet Commonly known as:  ALDACTONE Take 2 tablets (200 mg total) by mouth daily.   torsemide 20 MG tablet Commonly known as:  DEMADEX Take 2 tablets (40 mg total) by mouth 2 (two) times daily. What changed:  medication strength  how much to take   vancomycin 1,500 mg in sodium chloride 0.9 % 500 mL Inject 1,500 mg into the vein daily.       Discharge Instructions: Please refer to Patient Instructions section of EMR for full details.  Patient was counseled important signs and symptoms that should prompt return to medical care, changes in medications, dietary instructions, activity restrictions, and follow up appointments.   Follow-Up Appointments: Follow-up [place], [place]. Go on 05/30/2016.   Specialty:  Family Medicine Why:  2:30pm appt for hospital follow up, arrive by 2:15pm Contact information: [address] [place] [address] [phone]           Sela Hilding, MD 05/28/2016, 3:32 PM PGY-1, [place]

## 2016-05-26 NOTE — Progress Notes (Signed)
Patient places himself on CPAP when ready. RT will assist with any issues.

## 2016-05-26 NOTE — Progress Notes (Signed)
   05/26/16 1000  Clinical Encounter Type  Visited With Patient  Visit Type Spiritual support  Consult/Referral To Nurse  Spiritual Encounters  Spiritual Needs Emotional  Stress Factors  Patient Stress Factors Loss of control  CHP visited with patient and offered emotional support and encouragement. Roe Coombs 05/26/16

## 2016-05-26 NOTE — Progress Notes (Signed)
Family Medicine Teaching Service Daily Progress Note Intern Pager: [phone]  Patient name: Matthew [name] Medical record number: [id number] Date of birth: 19-Feb-1956 Age: 60 y.o.: ad Gender: male  Primary Care Provider: Junie Panning, DO Consultants: ID Code Status: FULL  Pt Overview and Major Events to Date:  8/19:  Admit with sepsis 2/2 RLE cellulitis. Started on Vanc/Zosyn 8/20: TEE negative for vegetations, normal EF 8/21: Zosyn dc'ed due to MRSA bacteremia 8/23: Started on Cefepime 2/2 GNR in 1 bottle 8/25: IR US thoracentesis of R pleural effusion: 1.5L yellow fluid; stopped Cefepime per ID  8/26: Fever to 102.80F > started on Zosyn. 8/27: dc Zosyn per ID reccs  Assessment and Plan: MRSA bacteremia, sepsis resolved: Afebrile. Leukocytosis 31.3>>>15.5>16.4>15.1. Febrile 8/26 to 102.80F and therefore started on Zosyn. - continue Vanc; needs total 2 weeks of therapy (day 9/14) - restarted Zosyn 8/26 due to fever > stopped 8/27 AM per ID reccs noted below - ID consulted: signed off 8/25, but spoke over the phone to inform of fever; recommended LE dopplers to rule out DVT and a Renal US, recommended stopping Zosyn if patient is hemodynamically stable. - Urine Culture: MRSA and Viridans Strep  - Pleural fluid Culture: NG, final - Repeat bood cx 8/21: 1 bot- bacillus species (ID felt likely contaminant); 2nd bot- NG x 4 days - Repeat blood cx 8/26: NG <24 hrs - will have to wait for PICC until blood cx from 8/26 return.   - LE dopplers bilateral no evidence of DVT - Renal US: no hydronephrosis or acute findings.  RLE Cellulitis:  - management as above  R Pleural Effusion s/p thoracentesis 8/25: Fluid studies with wbc 1,041 w/ 3% neutrophils, LD 138. No organisms seen on gram stain. Discussed with pulm (Dr. Nelda Marseille) who reports studies consistent with transudative fluid and no further workup needed. LDH ratio 0.46, Protein 0.44, pleural LDH > 2/3 ULN of serum LDH - cultures: NG < 24  hours  - PPD (ordered prior to discussion with pulm)   Anasarca and alcoholic cirrhosis: Wt 0000000 > 436 > 433>423. Admit weight 415. Most recent weight in clinic 409.Recent weight increase in clinic led to increasing spiro and HCTZ.Also on Demadex outpatient. BNP >600 on admit (baseline ~200).  Echo EF 65%, mild basal septal LV hypertrophy, indeterminate diastolic function 2/2 afib. Received IV Lasx 20mg  x1 8/25. I/O 2.4 L out over 24 hours.  - PO torsemide 40mg  BID (8/27 >) - BMP pending - Strict I/Os - Daily weights - holding home HCTZ, spiro  DMII: Diet controlled. Cover with SSI. Most recent A1C 6.0 in April.  - repeat HgbA1c on admission = 6.9 (from 6.9). Consider metformin as outpatient vs dietary changes. - continue home neurontin - sensitive sliding scale insulin - monitor CBGs  AKI, improving: Cr baseline 1.08. Cr 1.83>>>1.49>1.57 > 1.61>1.42>1.48. - continue to monitor  HTN: Home meds are HCTZ 12.5mg  daily.  -holding HCTZ as pt has been normotensive  Afib: Rate controlled on dilt, will continue.  -continue dilt w/ holding parameters SBP<110, DBP<60 -regarding anticoagulation, CHADsvasc score is 2> started ASA 81mg  EC  OSA: Uses CPAP at home.  -continue CPAP qhs  Other:  - HHOT: HH OT (ordered) - PT: no PT follow up; interested in tub bench, cane and reacher.  - nutrition: would likely benefit from outpatient nutrition counseling.   FEN/GI: heart healthy (patient refuses carb modified diet), SSI Prophylaxis: lovenox  Disposition: pending further evaluation of bacteremia   Subjective:  Pt reporting significant  urinary output, but subjectively less than with lasix. Endorses chills this morning, afebrile.   Objective: Temp:  [98.4 F (36.9 C)-98.6 F (37 C)] 98.4 F (36.9 C) (08/28 0404) Pulse Rate:  [77-88] 77 (08/28 0404) Resp:  [18] 18 (08/28 0404) BP: (114-136)/(61-76) 136/76 (08/28 0404) SpO2:  [93 %-94 %] 93 % (08/28 0404) Weight:  [423 lb  11.2 oz (192.2 kg)] 423 lb 11.2 oz (192.2 kg) (08/28 0404) Physical Exam: General: NAD, morbidly obese male, pleasant. Cardiovascular:difficult to auscultate 2/2 body habitus but sounds like anirregularly irregular rhythm, no m/r/g Respiratory: on room air. decreased air movement over right base. No increased work of breathing Abdomen: large pannus w/ marked thickening of skin on the inferior aspect, SNT, umbilical hernia not reducible and nontender Extremities: 2+ BLE, skin darkening consistent with chronic venous stasis, R leg erythematous and warm up to lateral knee. Nonpurulent.   Laboratory:  Recent Labs Lab 05/23/16 0807 05/24/16 0423 05/25/16 0604  WBC 15.5* 16.4* 15.1*  HGB 14.9 14.3 14.4  HCT 45.0 44.1 44.2  PLT 428* 456* 474*    Recent Labs Lab 05/24/16 0423 05/24/16 0906 05/25/16 0500 05/25/16 0947 05/26/16 0519  NA 137  --   --  136 138  K 4.7  --   --  4.8 4.2  CL 99*  --   --  102 100*  CO2 31  --   --  30 29  BUN 25*  --   --  22* 23*  CREATININE 1.61*  --  1.42* 1.35* 1.48*  CALCIUM 8.8*  --   --  8.6* 8.9  PROT  --  7.1  --   --  7.6  BILITOT  --   --   --   --  1.3*  ALKPHOS  --   --   --   --  146*  ALT  --   --   --   --  23  AST  --   --   --   --  33  GLUCOSE 96  --   --  97 92    Blood Culture 8/21: 1 bottle with bacillus species; 2nd bottle NG x 4 days Pleural Fluid Studies:  Results for ALLYN, VANDERVEER (MRN [id number]) as of 05/24/2016 07:07  Ref. Range 05/23/2016 16:09  Glucose, Fluid Latest Units: mg/dL 133  Fluid Type-FGLU Unknown FLUID  Fluid Type-FLDH Unknown FLUID  LD, Fluid Latest Ref Range: 3 - 23 U/L 138 (H)  Total protein, fluid Latest Units: g/dL 3.3  Fluid Type-FCT Unknown FLUID  Fluid Type-FTP Unknown FLUID  Color, Fluid Latest Ref Range: YELLOW  YELLOW  WBC, Fluid Latest Ref Range: 0 - 1,000 cu mm 1,041 (H)  Lymphs, Fluid Latest Units: % 91  Appearance, Fluid Latest Ref Range: CLEAR  CLOUDY (A)  Neutrophil Count, Fluid  Latest Ref Range: 0 - 25 % 3  Monocyte-Macrophage-Serous Fluid Latest Ref Range: 50 - 90 % 6 (L)  Pleural Fluid Culture: pending   Imaging/Diagnostic Tests: US Renal  Result Date: 05/25/2016 CLINICAL DATA:  MRSA infection.  MRSA in urine culture. EXAM: RENAL / URINARY TRACT ULTRASOUND COMPLETE COMPARISON:  05/18/2016 FINDINGS: Right Kidney: Length: 15.0 cm. Normal echotexture. No hydronephrosis. 5 cm cyst in the upper pole. No hydronephrosis. Left Kidney: Length: 13.9 cm. Echogenicity within normal limits. No mass or hydronephrosis visualized. Bladder: Not visualized, decompressed IMPRESSION: No hydronephrosis.  No acute findings. Electronically Signed   By: Rolm Baptise M.D.   On: 05/25/2016 09:54  Sela Hilding, MD 05/26/2016, 6:51 AM PGY-1, [place] Intern pager: [phone], text pages welcome

## 2016-05-27 ENCOUNTER — Telehealth: Payer: Self-pay | Admitting: *Deleted

## 2016-05-27 DIAGNOSIS — E65 Localized adiposity: Secondary | ICD-10-CM

## 2016-05-27 LAB — BASIC METABOLIC PANEL
ANION GAP: 7 (ref 5–15)
BUN: 24 mg/dL — ABNORMAL HIGH (ref 6–20)
CALCIUM: 8.8 mg/dL — AB (ref 8.9–10.3)
CO2: 33 mmol/L — ABNORMAL HIGH (ref 22–32)
Chloride: 97 mmol/L — ABNORMAL LOW (ref 101–111)
Creatinine, Ser: 1.51 mg/dL — ABNORMAL HIGH (ref 0.61–1.24)
GFR calc Af Amer: 57 mL/min — ABNORMAL LOW (ref >60)
GFR, EST NON AFRICAN AMERICAN: 49 mL/min — AB (ref >60)
GLUCOSE: 88 mg/dL (ref 65–99)
Potassium: 3.6 mmol/L (ref 3.5–5.1)
Sodium: 137 mmol/L (ref 135–145)

## 2016-05-27 LAB — CBC
HCT: 40.3 % (ref 39.0–52.0)
Hemoglobin: 13.7 g/dL (ref 13.0–17.0)
MCH: 32.3 pg (ref 26.0–34.0)
MCHC: 34 g/dL (ref 30.0–36.0)
MCV: 95 fL (ref 78.0–100.0)
PLATELETS: 454 10*3/uL — AB (ref 150–400)
RBC: 4.24 MIL/uL (ref 4.22–5.81)
RDW: 16.4 % — AB (ref 11.5–15.5)
WBC: 14.8 10*3/uL — AB (ref 4.0–10.5)

## 2016-05-27 LAB — GLUCOSE, CAPILLARY
Glucose-Capillary: 106 mg/dL — ABNORMAL HIGH (ref 65–99)
Glucose-Capillary: 98 mg/dL (ref 65–99)
Glucose-Capillary: 114 mg/dL — ABNORMAL HIGH (ref 65–99)

## 2016-05-27 LAB — CHOLESTEROL, BODY FLUID: CHOL FL: 45 mg/dL

## 2016-05-27 MED ORDER — ASPIRIN 81 MG PO TBEC: 81.0000 mg | DELAYED_RELEASE_TABLET | Freq: Every day | ORAL | 11 refills | Status: AC

## 2016-05-27 MED ORDER — VANCOMYCIN HCL 10 G IV SOLR: 1500.0000 mg | INTRAVENOUS | 0 refills | Status: DC

## 2016-05-27 MED ORDER — METFORMIN HCL ER (MOD) 1000 MG PO TB24: 1000.0000 mg | ORAL_TABLET | Freq: Every day | ORAL | 1 refills | Status: DC

## 2016-05-27 MED ORDER — POTASSIUM CHLORIDE CRYS ER 20 MEQ PO TBCR
40.0000 meq | EXTENDED_RELEASE_TABLET | Freq: Once | ORAL | Status: AC
  Administered 2016-05-27: 40 meq via ORAL
  Filled 2016-05-27: qty 2

## 2016-05-27 MED ORDER — TORSEMIDE 20 MG PO TABS: 40.0000 mg | ORAL_TABLET | Freq: Two times a day (BID) | ORAL | 1 refills | Status: DC

## 2016-05-27 NOTE — Progress Notes (Signed)
Family Medicine Teaching Service Daily Progress Note Intern Pager: [phone]  Patient name: Matthew [name] Medical record number: [id number] Date of birth: December 01, 1955 Age: 60 y.o.: ad Gender: male  Primary Care Provider: Junie Panning, DO Consultants: ID Code Status: FULL  Pt Overview and Major Events to Date:  8/19:  Admit with sepsis 2/2 RLE cellulitis. Started on Vanc/Zosyn 8/20: TEE negative for vegetations, normal EF 8/21: Zosyn dc'ed due to MRSA bacteremia 8/23: Started on Cefepime 2/2 GNR in 1 bottle 8/25: IR US thoracentesis of R pleural effusion: 1.5L yellow fluid; stopped Cefepime per ID  8/26: Fever to 102.61F > started on Zosyn. 8/27: dc Zosyn per ID reccs  Assessment and Plan: MRSA bacteremia, sepsis resolved: Afebrile. Leukocytosis 31.3>>>15.5>16.4>15.1>14.8. Febrile 8/26 to 102.61F and therefore started on Zosyn. - continue Vanc; needs total 2 weeks of therapy (day 9/14) - restarted Zosyn 8/26 due to fever > stopped 8/27 AM per ID reccs noted below - ID consulted: continue vanc for 2 weeks total - Vanc end date = 06/02/16. 2 weeks from repeat [facility] that did not grow MRSA - Urine Culture: MRSA and Viridans Strep  - Pleural fluid Culture: NG, final - Repeat bood cx 8/21: 1 bot- bacillus species (ID felt likely contaminant); 2nd bot- NG x 4 days - Repeat blood cx 8/26: NG 2 days  - LE dopplers bilateral no evidence of DVT - Renal US: no hydronephrosis or acute findings.  RLE Cellulitis:  - management as above  R Pleural Effusion s/p thoracentesis 8/25: Fluid studies with wbc 1,041 w/ 3% neutrophils, LD 138. No organisms seen on gram stain. Discussed with pulm (Dr. Nelda Marseille) who reports studies consistent with transudative fluid and no further workup needed. LDH ratio 0.46, Protein 0.44, pleural LDH > 2/3 ULN of serum LDH - cultures: NG < 24 hours  - PPD (ordered prior to discussion with pulm)   Anasarca and alcoholic cirrhosis: Wt 0000000 > 436 > 433>422. Admit weight  415. Most recent weight in clinic 409.Recent weight increase in clinic led to increasing spiro and HCTZ.Also on Demadex outpatient. BNP >600 on admit (baseline ~200).  Echo EF 65%, mild basal septal LV hypertrophy, indeterminate diastolic function 2/2 afib. Received IV Lasx 20mg  x1 8/25. I/O 5.4L out over 24 hours.  - PO torsemide 40mg  BID  - will replete K today, 3.6  - BMP pending - Strict I/Os - Daily weights - holding home HCTZ, spiro  DMII: Diet controlled. Cover with SSI. Most recent A1C 6.0 in April.  - repeat HgbA1c on admission = 6.9 (from 6.9). Consider metformin as outpatient vs dietary changes. - continue home neurontin - sensitive sliding scale insulin - monitor CBGs  AKI, improving: Cr baseline 1.08. Cr 1.83>>>1.49>1.57 > 1.61>1.42>1.48>1.51. - continue to monitor  HTN: Home meds are HCTZ 12.5mg  daily.  -holding HCTZ as pt has been normotensive  Afib: Rate controlled on dilt, will continue.  -continue dilt w/ holding parameters SBP<110, DBP<60 -regarding anticoagulation, CHADsvasc score is 2> started ASA 81mg  EC  OSA: Uses CPAP at home.  -continue CPAP qhs  Other:  - HHOT: HH OT (ordered) - PT: no PT follow up; interested in tub bench, cane and reacher.  - nutrition: would likely benefit from outpatient nutrition counseling.   FEN/GI: heart healthy (patient refuses carb modified diet), SSI Prophylaxis: lovenox  Disposition: pending further evaluation of bacteremia   Subjective:  Pt willing to go home today. Reports significant difficulty at home with moving around, including bathing and walking to the bathroom.  Willing to have PT reassess today prior to discharge.   Objective: Temp:  [97.7 F (36.5 C)-97.9 F (36.6 C)] 97.9 F (36.6 C) (08/29 0505) Pulse Rate:  [79-95] 95 (08/29 0505) Resp:  [18-20] 20 (08/29 0505) BP: (106-117)/(54-63) 117/63 (08/29 0505) SpO2:  [94 %-97 %] 96 % (08/29 0505) Weight:  [422 lb 14.4 oz (191.8 kg)] 422 lb 14.4 oz  (191.8 kg) (08/29 0505) Physical Exam: General: NAD, morbidly obese male, pleasant. Cardiovascular:difficult to auscultate 2/2 body habitus but sounds like anirregularly irregular rhythm, no m/r/g Respiratory: on room air. decreased air movement over right base. No increased work of breathing Abdomen: large pannus w/ marked thickening of skin on the inferior aspect, SNT, umbilical hernia not reducible and nontender Extremities: 2+ BLE, skin darkening consistent with chronic venous stasis, R leg erythematous and warm up to lateral knee. Nonpurulent.   Laboratory:  Recent Labs Lab 05/25/16 0604 05/26/16 1638 05/27/16 0435  WBC 15.1* 16.6* 14.8*  HGB 14.4 15.0 13.7  HCT 44.2 44.4 40.3  PLT 474* 447* 454*    Recent Labs Lab 05/24/16 0906  05/25/16 0947 05/26/16 0519 05/27/16 0435  NA  --   --  136 138 137  K  --   --  4.8 4.2 3.6  CL  --   --  102 100* 97*  CO2  --   --  30 29 33*  BUN  --   --  22* 23* 24*  CREATININE  --   < > 1.35* 1.48* 1.51*  CALCIUM  --   --  8.6* 8.9 8.8*  PROT 7.1  --   --  7.6  --   BILITOT  --   --   --  1.3*  --   ALKPHOS  --   --   --  146*  --   ALT  --   --   --  23  --   AST  --   --   --  33  --   GLUCOSE  --   --  97 92 88  < > = values in this interval not displayed.  Blood Culture 8/21: 1 bottle with bacillus species; 2nd bottle NG x 4 days Pleural Fluid Studies:  Results for ELIUT, GILDAY (MRN [id number]) as of 05/24/2016 07:07  Ref. Range 05/23/2016 16:09  Glucose, Fluid Latest Units: mg/dL 133  Fluid Type-FGLU Unknown FLUID  Fluid Type-FLDH Unknown FLUID  LD, Fluid Latest Ref Range: 3 - 23 U/L 138 (H)  Total protein, fluid Latest Units: g/dL 3.3  Fluid Type-FCT Unknown FLUID  Fluid Type-FTP Unknown FLUID  Color, Fluid Latest Ref Range: YELLOW  YELLOW  WBC, Fluid Latest Ref Range: 0 - 1,000 cu mm 1,041 (H)  Lymphs, Fluid Latest Units: % 91  Appearance, Fluid Latest Ref Range: CLEAR  CLOUDY (A)  Neutrophil Count, Fluid  Latest Ref Range: 0 - 25 % 3  Monocyte-Macrophage-Serous Fluid Latest Ref Range: 50 - 90 % 6 (L)  Pleural Fluid Culture: pending   Imaging/Diagnostic Tests: Dg Chest Port 1 View  Result Date: 05/26/2016 CLINICAL DATA:  Shortness of breath.  PICC placement. EXAM: PORTABLE CHEST 1 VIEW COMPARISON:  05/23/2016 and 05/22/2016 FINDINGS: PICC tip is in the superior vena cava just above the carina in good position. Decreased right pleural effusion. Almost complete resolution of the atelectasis at the left lung base. Heart size and pulmonary vascularity are normal. IMPRESSION: PICC in good position.  Decreasing right effusion. Electronically Signed  By: Lorriane Shire M.D.   On: 05/26/2016 13:19    Sela Hilding, MD 05/27/2016, 7:02 AM PGY-1, [place] Intern pager: [phone], text pages welcome

## 2016-05-27 NOTE — Progress Notes (Signed)
Patient to be discharged today with [facility] provided by [place]. CM talked to Carolynn Sayers RN with [facility], all arrangements have been made for home IV antibiotics. Mindi Slicker [facility] [phone]

## 2016-05-27 NOTE — Discharge Summary (Signed)
Pt got discharged, discharge instructions provided and patient showed understanding to it, Pt left with PICC Line arranging home health nurse for the future IVABX.Telemonitor DC,pt left unit in wheelchair with all of the belongings.Taxi Voucher is arranged with the help of Case Manager.

## 2016-05-27 NOTE — Progress Notes (Signed)
Pt continues to constantly take his CPAP off at night after very short periods of use. Will continue to monitor.

## 2016-05-27 NOTE — Evaluation (Signed)
Physical Therapy Evaluation Patient Details Name: Matthew [name] MRN: [id number] DOB: 02-Jun-1956 Today's Date: 05/27/2016   History of Present Illness  60 year old male presenting with sudden onset of chills day of admission. Known morbid obesity, type 2 diabetes, alcohol abuse, alcoholic cirrhosis. Presented emergency department. Found to have right lower extremity cellulitis. +fluid overload  Clinical Impression  Pt admitted with above diagnosis. Pt currently with functional limitations due to the deficits listed below (see PT Problem List).  Pt will benefit from skilled PT to increase their independence and safety with mobility to allow discharge to the venue listed below.  Pt is close to baseline level, but initiated gait training with cane today.  Pt with a lot of questions and concerns about how to do things at home, therefore recommend HHPT eval.  Pt is open to this only if it is covered.  Pt also with hardened blisters on lateral border of each foot and feel he may benefit from assessment for diabetic shoes.  Also recommend bariatric tub bench and reacher.  Pt is aware on where to purchase reacher.  Will check on pt later in week to see how he is doing with cane and initiate HEP.     Follow Up Recommendations Home health PT (if covered. Home environment assessment)    Equipment Recommendations  Other (comment) (bariatric tub transfer bench , possibly diabetic shoes,and reacher)    Recommendations for Other Services       Precautions / Restrictions Precautions Precautions: Fall      Mobility  Bed Mobility               General bed mobility comments: sitting EOB  Transfers Overall transfer level: Modified independent Equipment used: Straight cane                Ambulation/Gait Ambulation/Gait assistance: Modified independent (Device/Increase time);Supervision Ambulation Distance (Feet): 64 Feet (plus 15) Assistive device: Straight cane Gait  Pattern/deviations: Step-through pattern;Wide base of support     General Gait Details: Pt with slow gait with wide BOS. o2 90% at end on RA  Stairs            Wheelchair Mobility    Modified Rankin (Stroke Patients Only)       Balance Overall balance assessment: Modified Independent                                           Pertinent Vitals/Pain Pain Assessment: Faces Faces Pain Scale: Hurts little more Pain Location: blisters on lateral borders of each foot Pain Descriptors / Indicators: Discomfort Pain Intervention(s): Limited activity within patient's tolerance;Monitored during session    Home Living Family/patient expects to be discharged to:: Private residence Living Arrangements: Alone   Type of Home: Apartment Home Access: Stairs to enter Entrance Stairs-Rails: None Entrance Stairs-Number of Steps: 1 Home Layout: One level Home Equipment: Walker - 4 wheels;Grab bars - tub/shower      Prior Function Level of Independence: Needs assistance   Gait / Transfers Assistance Needed: walks independently in apartment.  Outside uses RW, but leaves it folded up and uses as a cane. Recently bought a cane and in hospital room.  ADL's / Homemaking Assistance Needed: needs assist to clean his tub/shower        Hand Dominance   Dominant Hand: Right    Extremity/Trunk Assessment   Upper Extremity Assessment: Defer to  OT evaluation           Lower Extremity Assessment: Overall WFL for tasks assessed;Generalized weakness         Communication   Communication: No difficulties  Cognition Arousal/Alertness: Awake/alert Behavior During Therapy: WFL for tasks assessed/performed Overall Cognitive Status: Within Functional Limits for tasks assessed                      General Comments General comments (skin integrity, edema, etc.): hardened blisters on lateral border of each foot    Exercises        Assessment/Plan    PT  Assessment Patient needs continued PT services  PT Diagnosis Generalized weakness   PT Problem List Decreased strength;Decreased activity tolerance;Decreased balance;Decreased mobility;Decreased knowledge of use of DME  PT Treatment Interventions Gait training;Functional mobility training;Therapeutic exercise;Balance training   PT Goals (Current goals can be found in the Care Plan section) Acute Rehab PT Goals Patient Stated Goal: move better PT Goal Formulation: With patient Time For Goal Achievement: 05/31/16 Potential to Achieve Goals: Good    Frequency Min 2X/week   Barriers to discharge        Co-evaluation               End of Session Equipment Utilized During Treatment: Gait belt Activity Tolerance: Patient tolerated treatment well Patient left: in chair Nurse Communication: Mobility status         Time: 1014-1100 PT Time Calculation (min) (ACUTE ONLY): 46 min   Charges:   PT Evaluation $PT Eval Low Complexity: 1 Procedure PT Treatments $Gait Training: 8-22 mins $Therapeutic Activity: 8-22 mins   PT G Codes:        Shadrick Senne LUBECK 05/27/2016, 11:27 AM

## 2016-05-27 NOTE — Telephone Encounter (Signed)
Patient currently admitted, states he was just told by Dr. Lindell Noe to decrease fluid pill. But he wants to review this with Dr. Andria Frames and has other medications he has questions about. Would like for Dr. Andria Frames to call him on his cell phone.

## 2016-05-27 NOTE — Telephone Encounter (Signed)
Tried to call x 2.  LM on voicemail that I trust the inpatient team management.

## 2016-05-27 NOTE — Discharge Instructions (Signed)
Please come to the [facility] on 05/30/16 at 2:15pm for a hospital follow up appointment with Dr. Gerlean Ren  Call us if you have an abrupt increase in swelling, increase in leg redness or pain, fevers, or otherwise have concerns.   Continue to work with PT on increasing your ability to tolerate activity at home. Also consider taking some of the nutritionist's suggestions about changes to your diet.

## 2016-05-28 ENCOUNTER — Telehealth: Payer: Self-pay | Admitting: Family Medicine

## 2016-05-28 DIAGNOSIS — L03119 Cellulitis of unspecified part of limb: Secondary | ICD-10-CM

## 2016-05-28 LAB — CULTURE, BODY FLUID W GRAM STAIN -BOTTLE

## 2016-05-28 LAB — CULTURE, BODY FLUID-BOTTLE: CULTURE: NO GROWTH

## 2016-05-28 NOTE — Telephone Encounter (Signed)
pts medicine was changed during his hospital stay.  His demadex(toursemid-generic) was decreased from 100mg  twice a day to 40 mg twice a day.  He only has 100 mg tabs. Pt wants to know if he can cut them in half. He also has other questions about the medicines on the discharge papers.

## 2016-05-29 ENCOUNTER — Telehealth: Payer: Self-pay | Admitting: *Deleted

## 2016-05-29 ENCOUNTER — Telehealth: Payer: Self-pay | Admitting: Family Medicine

## 2016-05-29 LAB — CULTURE, BLOOD (ROUTINE X 2)
CULTURE: NO GROWTH
Culture: NO GROWTH

## 2016-05-29 NOTE — Telephone Encounter (Signed)
He may cut his Demedex in half. Based on discharge instructions, he is to discontinue Hydrochlorothiazide and continue taking Aspirin, Diltiazem 180mg  daily, Gabapentin 100mg  three times daily, Metformin 1000mg  daily, Spironolactone 200mg  daily, Torsemide 40mg  (may use half of 100mg  tablets), and Vancomycin. Appointment scheduled on 05/30/16 to further review medications.

## 2016-05-29 NOTE — Telephone Encounter (Signed)
The dosage for his toursemide was changed to 40 mg.  He has 100 mg tabs.  What should he do? Other questions: Would like orders for a social worker There are wounds on both of his feet.  What does dr Gerlean Ren want to do? His A1C in the hospital was 6.9. Does the dr want him to test his blood sugars? If so, he needs the glucometer and supplies.   What diet for the pt? Precious Bard stated she needed the answers to these questions today.

## 2016-05-29 NOTE — Telephone Encounter (Signed)
Pt has been scheduled to see Dr. Vela Prose Sept 7th. Ottis Stain, CMA

## 2016-05-29 NOTE — Telephone Encounter (Signed)
Pt has been scheduled to meet with Dr. Andria Frames on Thursday Sept 7th. Ottis Stain, CMA

## 2016-05-29 NOTE — Telephone Encounter (Signed)
Herbert Deaner, Physical Therapist with [facility] called stating patient has requested that they push back the initial physical therapy visit until early next week.  Physical therapy will start on Monday or Tuesday of next week.  Just a FYI for the provider.  Derl Barrow, RN

## 2016-05-29 NOTE — Telephone Encounter (Signed)
Spoke to Mr. Busler. He will not be able to make the appt tomorrow. Doesn't have a ride and doesn't feel comfortable driving. Would like to verify he is to take Spironolactone 200 mg daily. He said he did not take that in the hospital. Should he start taking it again. He doesn't have metformin at home needs a RX. Also needs a glucometer and supplies. I am trying to get him an appt for next week. Ottis Stain, CMA

## 2016-05-29 NOTE — Telephone Encounter (Signed)
Spoke with [name]. I gave her answers to the medication. She would also like either wound care orders or have him sent to the wound care center. Please let her know what she should do to help this pt. Matthew [name], CMA

## 2016-05-29 NOTE — Telephone Encounter (Signed)
LVM for pt to call to inform him of Dr. Johnathan Hausen message. Ottis Stain, CMA

## 2016-05-29 NOTE — Telephone Encounter (Signed)
[facility]: pt was released from hospital yesterday. He was put on metformin but doesn't have a RX.

## 2016-05-30 ENCOUNTER — Inpatient Hospital Stay: Payer: Self-pay | Admitting: Family Medicine

## 2016-05-30 NOTE — Telephone Encounter (Signed)
Metformin prescribed 05/28/15 for #30 tablets with one refill--this should be at the pharmacy for him. Would not recommend glucometer checks at this time since there is low risk for hypoglycemia with Metformin, however he may discuss this at his next office visit. Referral to home health placed.

## 2016-05-30 NOTE — Telephone Encounter (Signed)
Matthew [name] states the pt has not gotten his metformin because he doesn't want to take it and the cost. He also has not received his glucometer and supplies. Please advise

## 2016-05-30 NOTE — Telephone Encounter (Signed)
Spoke to Mr. Matthew [name], per Dr. Gerlean Ren I informed him to start taking the Spironolactone 200 mg daily. Pt said he would. I informed him that Metformin has been prescribed and should be at the pharmacy for him to pick up. Mr. Matthew [name] informed me that he will discuss this med with Dr. Andria Frames as he does not want to take this medication. Ottis Stain, CMA

## 2016-05-30 NOTE — Telephone Encounter (Signed)
Spoke to [place], told her that I have spoken to Mr. Zaloudek and he said he would speak to Dr. Andria Frames next week about Metformin and the Glucometer. Patti asked about mental health and I told her we have a [place] team and a Education officer, museum here to help him. Precious Bard said she would let us know if anything  changes. Ottis Stain, CMA

## 2016-05-30 NOTE — Telephone Encounter (Signed)
Please call Precious Bard to let her know what dr says

## 2016-06-01 ENCOUNTER — Inpatient Hospital Stay (HOSPITAL_COMMUNITY)
Admission: EM | Admit: 2016-06-01 | Discharge: 2016-06-06 | DRG: 314 | Disposition: A | Discharge status: Home Health | Payer: Medicare Other | Attending: Family Medicine | Admitting: Family Medicine

## 2016-06-01 ENCOUNTER — Emergency Department (HOSPITAL_COMMUNITY): Payer: Medicare Other

## 2016-06-01 ENCOUNTER — Encounter (HOSPITAL_COMMUNITY): Payer: Self-pay | Admitting: Emergency Medicine

## 2016-06-01 DIAGNOSIS — R509 Fever, unspecified: Secondary | ICD-10-CM | POA: Diagnosis not present

## 2016-06-01 DIAGNOSIS — L03115 Cellulitis of right lower limb: Secondary | ICD-10-CM | POA: Diagnosis not present

## 2016-06-01 DIAGNOSIS — I10 Essential (primary) hypertension: Secondary | ICD-10-CM

## 2016-06-01 DIAGNOSIS — Z79899 Other long term (current) drug therapy: Secondary | ICD-10-CM

## 2016-06-01 DIAGNOSIS — K7290 Hepatic failure, unspecified without coma: Secondary | ICD-10-CM | POA: Diagnosis present

## 2016-06-01 DIAGNOSIS — Z6841 Body Mass Index (BMI) 40.0 and over, adult: Secondary | ICD-10-CM

## 2016-06-01 DIAGNOSIS — J918 Pleural effusion in other conditions classified elsewhere: Secondary | ICD-10-CM | POA: Diagnosis present

## 2016-06-01 DIAGNOSIS — E11622 Type 2 diabetes mellitus with other skin ulcer: Secondary | ICD-10-CM | POA: Diagnosis not present

## 2016-06-01 DIAGNOSIS — B9689 Other specified bacterial agents as the cause of diseases classified elsewhere: Secondary | ICD-10-CM | POA: Diagnosis not present

## 2016-06-01 DIAGNOSIS — I872 Venous insufficiency (chronic) (peripheral): Secondary | ICD-10-CM | POA: Diagnosis present

## 2016-06-01 DIAGNOSIS — L0390 Cellulitis, unspecified: Secondary | ICD-10-CM | POA: Diagnosis present

## 2016-06-01 DIAGNOSIS — I130 Hypertensive heart and chronic kidney disease with heart failure and stage 1 through stage 4 chronic kidney disease, or unspecified chronic kidney disease: Secondary | ICD-10-CM | POA: Diagnosis present

## 2016-06-01 DIAGNOSIS — I482 Chronic atrial fibrillation: Secondary | ICD-10-CM | POA: Diagnosis present

## 2016-06-01 DIAGNOSIS — E662 Morbid (severe) obesity with alveolar hypoventilation: Secondary | ICD-10-CM | POA: Diagnosis present

## 2016-06-01 DIAGNOSIS — Z87891 Personal history of nicotine dependence: Secondary | ICD-10-CM

## 2016-06-01 DIAGNOSIS — Z9081 Acquired absence of spleen: Secondary | ICD-10-CM

## 2016-06-01 DIAGNOSIS — G4733 Obstructive sleep apnea (adult) (pediatric): Secondary | ICD-10-CM

## 2016-06-01 DIAGNOSIS — Z7984 Long term (current) use of oral hypoglycemic drugs: Secondary | ICD-10-CM | POA: Diagnosis not present

## 2016-06-01 DIAGNOSIS — E119 Type 2 diabetes mellitus without complications: Secondary | ICD-10-CM | POA: Diagnosis present

## 2016-06-01 DIAGNOSIS — T827XXA Infection and inflammatory reaction due to other cardiac and vascular devices, implants and grafts, initial encounter: Principal | ICD-10-CM | POA: Diagnosis present

## 2016-06-01 DIAGNOSIS — K7030 Alcoholic cirrhosis of liver without ascites: Secondary | ICD-10-CM | POA: Diagnosis present

## 2016-06-01 DIAGNOSIS — K429 Umbilical hernia without obstruction or gangrene: Secondary | ICD-10-CM | POA: Diagnosis present

## 2016-06-01 DIAGNOSIS — K219 Gastro-esophageal reflux disease without esophagitis: Secondary | ICD-10-CM | POA: Diagnosis present

## 2016-06-01 DIAGNOSIS — Z7982 Long term (current) use of aspirin: Secondary | ICD-10-CM | POA: Diagnosis not present

## 2016-06-01 DIAGNOSIS — I480 Paroxysmal atrial fibrillation: Secondary | ICD-10-CM | POA: Diagnosis present

## 2016-06-01 DIAGNOSIS — B9562 Methicillin resistant Staphylococcus aureus infection as the cause of diseases classified elsewhere: Secondary | ICD-10-CM

## 2016-06-01 DIAGNOSIS — N179 Acute kidney failure, unspecified: Secondary | ICD-10-CM | POA: Diagnosis present

## 2016-06-01 DIAGNOSIS — A419 Sepsis, unspecified organism: Secondary | ICD-10-CM | POA: Diagnosis present

## 2016-06-01 DIAGNOSIS — R609 Edema, unspecified: Secondary | ICD-10-CM | POA: Diagnosis not present

## 2016-06-01 DIAGNOSIS — Z8614 Personal history of Methicillin resistant Staphylococcus aureus infection: Secondary | ICD-10-CM | POA: Diagnosis not present

## 2016-06-01 LAB — COMPREHENSIVE METABOLIC PANEL
ALK PHOS: 144 U/L — AB (ref 38–126)
ALT: 23 U/L (ref 17–63)
AST: 43 U/L — AB (ref 15–41)
Albumin: 2.2 g/dL — ABNORMAL LOW (ref 3.5–5.0)
Anion gap: 9 (ref 5–15)
BILIRUBIN TOTAL: 1.2 mg/dL (ref 0.3–1.2)
BUN: 22 mg/dL — AB (ref 6–20)
CALCIUM: 8.4 mg/dL — AB (ref 8.9–10.3)
CO2: 29 mmol/L (ref 22–32)
CREATININE: 1.47 mg/dL — AB (ref 0.61–1.24)
Chloride: 96 mmol/L — ABNORMAL LOW (ref 101–111)
GFR calc Af Amer: 59 mL/min — ABNORMAL LOW (ref >60)
GFR, EST NON AFRICAN AMERICAN: 50 mL/min — AB (ref >60)
GLUCOSE: 129 mg/dL — AB (ref 65–99)
Potassium: 4.3 mmol/L (ref 3.5–5.1)
Sodium: 134 mmol/L — ABNORMAL LOW (ref 135–145)
TOTAL PROTEIN: 8.1 g/dL (ref 6.5–8.1)

## 2016-06-01 LAB — GLUCOSE, CAPILLARY
GLUCOSE-CAPILLARY: 110 mg/dL — AB (ref 65–99)
GLUCOSE-CAPILLARY: 184 mg/dL — AB (ref 65–99)
GLUCOSE-CAPILLARY: 115 mg/dL — AB (ref 65–99)
GLUCOSE-CAPILLARY: 117 mg/dL — AB (ref 65–99)

## 2016-06-01 LAB — URINE MICROSCOPIC-ADD ON

## 2016-06-01 LAB — I-STAT CG4 LACTIC ACID, ED: LACTIC ACID, VENOUS: 2.88 mmol/L — AB (ref 0.5–1.9)

## 2016-06-01 LAB — CBC WITH DIFFERENTIAL/PLATELET
BASOS PCT: 0 %
Basophils Absolute: 0.1 10*3/uL (ref 0.0–0.1)
EOS ABS: 0 10*3/uL (ref 0.0–0.7)
Eosinophils Relative: 0 %
HCT: 44.4 % (ref 39.0–52.0)
Hemoglobin: 14.9 g/dL (ref 13.0–17.0)
LYMPHS ABS: 1.2 10*3/uL (ref 0.7–4.0)
Lymphocytes Relative: 5 %
MCH: 32 pg (ref 26.0–34.0)
MCHC: 33.6 g/dL (ref 30.0–36.0)
MCV: 95.5 fL (ref 78.0–100.0)
MONO ABS: 2.2 10*3/uL — AB (ref 0.1–1.0)
MONOS PCT: 9 %
Neutro Abs: 20.3 10*3/uL — ABNORMAL HIGH (ref 1.7–7.7)
Neutrophils Relative %: 86 %
Platelets: 366 10*3/uL (ref 150–400)
RBC: 4.65 MIL/uL (ref 4.22–5.81)
RDW: 15.9 % — AB (ref 11.5–15.5)
WBC: 23.8 10*3/uL — ABNORMAL HIGH (ref 4.0–10.5)

## 2016-06-01 LAB — URINALYSIS, ROUTINE W REFLEX MICROSCOPIC
BILIRUBIN URINE: NEGATIVE
GLUCOSE, UA: NEGATIVE mg/dL
Ketones, ur: NEGATIVE mg/dL
Nitrite: NEGATIVE
Protein, ur: NEGATIVE mg/dL
SPECIFIC GRAVITY, URINE: 1.013 (ref 1.005–1.030)
pH: 6.5 (ref 5.0–8.0)

## 2016-06-01 LAB — LACTIC ACID, PLASMA
LACTIC ACID, VENOUS: 2.9 mmol/L — AB (ref 0.5–1.9)
Lactic Acid, Venous: 2.2 mmol/L (ref 0.5–1.9)

## 2016-06-01 LAB — VANCOMYCIN, TROUGH: Vancomycin Tr: 14 ug/mL — ABNORMAL LOW (ref 15–20)

## 2016-06-01 MED ORDER — SODIUM CHLORIDE 0.9% FLUSH
3.0000 mL | Freq: Two times a day (BID) | INTRAVENOUS | Status: DC
  Administered 2016-06-01 – 2016-06-06 (×10): 3 mL via INTRAVENOUS

## 2016-06-01 MED ORDER — INSULIN ASPART 100 UNIT/ML ~~LOC~~ SOLN
0.0000 [IU] | Freq: Three times a day (TID) | SUBCUTANEOUS | Status: DC
  Administered 2016-06-01: 2 [IU] via SUBCUTANEOUS
  Administered 2016-06-02 – 2016-06-05 (×4): 1 [IU] via SUBCUTANEOUS

## 2016-06-01 MED ORDER — DEXTROSE 5 % IV SOLN
2.0000 g | Freq: Three times a day (TID) | INTRAVENOUS | Status: DC
  Administered 2016-06-01 – 2016-06-05 (×13): 2 g via INTRAVENOUS
  Filled 2016-06-01 (×16): qty 2

## 2016-06-01 MED ORDER — ENOXAPARIN SODIUM 40 MG/0.4ML ~~LOC~~ SOLN: 40.0000 mg | SUBCUTANEOUS | Status: DC

## 2016-06-01 MED ORDER — SODIUM CHLORIDE 0.9 % IV BOLUS (SEPSIS)
30.0000 mL/kg | Freq: Once | INTRAVENOUS | Status: AC
  Administered 2016-06-01: 5784 mL via INTRAVENOUS
  Filled 2016-06-01: qty 5850

## 2016-06-01 MED ORDER — SODIUM CHLORIDE 0.9% FLUSH: 10.0000 mL | INTRAVENOUS | Status: DC | PRN

## 2016-06-01 MED ORDER — GABAPENTIN 300 MG PO CAPS
300.0000 mg | ORAL_CAPSULE | Freq: Three times a day (TID) | ORAL | Status: DC
  Administered 2016-06-01 – 2016-06-06 (×17): 300 mg via ORAL
  Filled 2016-06-01 (×17): qty 1

## 2016-06-01 MED ORDER — NYSTATIN 100000 UNIT/GM EX POWD
Freq: Three times a day (TID) | CUTANEOUS | Status: DC
  Administered 2016-06-01 – 2016-06-06 (×16): via TOPICAL
  Filled 2016-06-01: qty 15

## 2016-06-01 MED ORDER — VANCOMYCIN HCL 10 G IV SOLR
1500.0000 mg | INTRAVENOUS | Status: DC
  Administered 2016-06-01 – 2016-06-04 (×4): 1500 mg via INTRAVENOUS
  Filled 2016-06-01 (×6): qty 1500

## 2016-06-01 MED ORDER — IBUPROFEN 200 MG PO TABS
600.0000 mg | ORAL_TABLET | Freq: Once | ORAL | Status: AC
  Administered 2016-06-01: 600 mg via ORAL
  Filled 2016-06-01: qty 3

## 2016-06-01 MED ORDER — ENOXAPARIN SODIUM 100 MG/ML ~~LOC~~ SOLN
100.0000 mg | SUBCUTANEOUS | Status: DC
  Administered 2016-06-01 – 2016-06-06 (×6): 100 mg via SUBCUTANEOUS
  Filled 2016-06-01 (×6): qty 1

## 2016-06-01 MED ORDER — DILTIAZEM HCL ER COATED BEADS 180 MG PO CP24
180.0000 mg | ORAL_CAPSULE | Freq: Every day | ORAL | Status: DC
  Administered 2016-06-01 – 2016-06-06 (×6): 180 mg via ORAL
  Filled 2016-06-01 (×6): qty 1

## 2016-06-01 MED ORDER — ASPIRIN EC 81 MG PO TBEC
81.0000 mg | DELAYED_RELEASE_TABLET | Freq: Every day | ORAL | Status: DC
  Administered 2016-06-01 – 2016-06-06 (×6): 81 mg via ORAL
  Filled 2016-06-01 (×6): qty 1

## 2016-06-01 MED ORDER — ACETAMINOPHEN 500 MG PO TABS
1000.0000 mg | ORAL_TABLET | Freq: Once | ORAL | Status: DC
Start: 2016-06-01 — End: 2016-06-01
  Filled 2016-06-01: qty 2

## 2016-06-01 NOTE — ED Notes (Signed)
Attempted to call report

## 2016-06-01 NOTE — H&P (Signed)
[facility] Admission History and Physical Service Pager: [phone]  Patient name: Matthew [name] Medical record number: [id number] Date of birth: 11-Jan-1956 Age: 60 y.o. Gender: male  Primary Care Provider: Junie Panning, DO Consultants:  ID Code Status: Full per discussion on admission   Chief Complaint:  Rigors  Assessment and Plan: Matthew [name] is a 60 y.o. male presenting with rigors currently being treated for MRSA bacteremia . PMH is significant for DMII, morbid obesity, alcoholic cirrhosis, alcohol abuse in remission, HTN, depression, atrial fibrillation, and GERD.   Spesis: Tachycardic to 102, Tmax 102.1, WBC 23, lactic acid 2.88.  Concerning in the setting of current vancomycin use without a definite cause. He continues to have an erythematous RLE with tracking up to the knee, however this appears stable from discharge physical exam. CXR with small R pleural effusion and bibasilar airspace opacities, however I suspect this may be more secondary to pulmonary edema than infection as he has not new SOB or cough.  The patient was noted to also grow Bacillus species in his blood culture during his recent hospitalization, thought to be a contaminant by ID. During his hospitalization, no vegetations were seen on TEE making endocarditis less likely. Patient does have a PICC line- it does not appear infected but one wonders if this could be the source.  - admit to telemetry, attending Dr. Ree Kida  - sepsis protocol, given h/o of volume overload in the setting of fluid resuscitation, will give 2-3L bolus of the patient's 30cc/kg (5.78L total recommended per protocol) and monitor status). - trend lactic acid - as the patient seemed to be afebrile on vanc/cefepime (and line infection is in the DDx), will restart cefepime.  - will consult ID - f/u blood/urine cultures  R pleural effusion: Small, thought ought to be secondary to end stage liver diease. This was  drained at previous hospitalization and found to be transudative. Seems to be much smaller than previous now. - continue to monitor   Alcoholic cirrhosis: Wt [id number] on admissoin (was 425lb on discharge).  Abdominal US with cirrhosis and probable steatosis without focal liver lesions noted on 8/20.  Echo EF 65%, mild basal septal LV hypertrophy, indeterminate diastolic function 2/2 afib.  - holding torsemide and spironolactone in the setting of fluid resuscitation for sepsis.  - Strict I/Os - Daily weights  DMII: Diet controlled. Cover with SSI. Most recent A1C 6.9 in 05/18/16.  -  Discussed importance of starting metformin as outpatient vs dietary changes. - continue home neurontin - sensitive sliding scale insulin - monitor CBGs  AKI, improving: Cr baseline 1.08, however was 1.51 on discharge. Currently 1.47. Curious if vancomycin could be contributing.  - continue to monitor  HTN: currently normotensive, HCTZ was discontinued during last hospitalization. - continue to monitor.   Afib: Rate controlled on dilt, will continue. CHADvasc score is 2, not on anticoagulation due to end stage liver failure.   -continue dilt w/ holding parameters SBP<110, DBP<60  OSA: Uses CPAP at home.  -continue CPAP qhs  FEN/GI: Card modified diet Prophylaxis: Lovenox  Disposition: admit to telemetry, attending Dr. Ree Kida  History of Present Illness:  Matthew [name] is a 60 y.o. male who was hospitailzed from 05/17/16 until 05/28/16 with RLE cellulitis found to have a MRSA bacteremia with MRSA also noted in the urine. A PICC line was placed and he was continued on vancomycin in the outpatient setting.   Approximately 1 hour prior  to presentation he noted significant  chills and rigors which is often consistent with infections per his report.  Some nausea, but no emesis. Didn't note fevers. No new SOB, stable DOA, no cough. No chest pain. No RLE pain initially but pain now notes pain in the ED. He cannot  see his legs so he is unsure if they are unchanged. He also denies dysuria, urinary urgency, or urinary frequency. No sore throat or URI type symptoms. He has been compliant with vancomycin, last dosing 4:30pm on day prior.   No implantatable devices.   Review Of Systems: Per HPI with the following additions: none  Otherwise the remainder of the systems were negative.  Patient Active Problem List   Diagnosis Date Noted  . Sepsis ([place]) 06/01/2016  . MRSA bacteremia 05/19/2016  . Sepsis due to methicillin resistant Staphylococcus aureus (MRSA) ([place])   . Cellulitis of right leg 05/18/2016  . CAP (community acquired pneumonia) 05/17/2016  . Neuropathic pain of both feet ([place]) 05/02/2016  . Urinary hesitancy 03/26/2016  . Cellulitis of leg, right 01/17/2016  . Recurrent pleural effusion on right 12/13/2015  . GERD (gastroesophageal reflux disease) 09/27/2015  . Rotator cuff (capsule) sprain 09/15/2015  . Leg edema 08/05/2015  . Diabetic ulcer of right foot associated with type 2 diabetes mellitus ([place])   . Paroxysmal atrial fibrillation (HCC)   . AKI (acute kidney injury) ([place])   . Depression, major, recurrent, moderate ([place]) 03/21/2015  . Alcoholic cirrhosis of liver with ascites ([place])   . Pleural effusion   . Essential hypertension   . Pannus, abdominal 02/23/2014  . 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 Poor social situation 11/11/2013  . Alcohol abuse, in remission 11/03/2013  . DM type 2 (diabetes mellitus, type 2) ([place]) 03/25/2012  . Morbid obesity ([place]) 03/25/2012  . Sleep apnea 03/25/2012    Past Medical History: Past Medical History:  Diagnosis Date  . Atrial fibrillation, chronic ([place])   . CHF (congestive heart failure) ([place])   . GERD (gastroesophageal reflux disease)   . Hypertension   . Septic shock ([place])   . Shortness of breath dyspnea     Past Surgical History: Past Surgical History:  Procedure Laterality Date  . HERNIA REPAIR    . KNEE ARTHROSCOPY     . SPLENECTOMY    . TEE WITHOUT CARDIOVERSION N/A 05/21/2016   Procedure: TRANSESOPHAGEAL ECHOCARDIOGRAM (TEE);  Surgeon: Larey Dresser, MD;  Location: [facility];  Service: Cardiovascular;  Laterality: N/A;  . TONSILECTOMY, ADENOIDECTOMY, BILATERAL MYRINGOTOMY AND TUBES      Social History: Social History  Substance Use Topics  . Smoking status: Former Smoker    Types: Cigarettes    Quit date: 11/01/1974  . Smokeless tobacco: Never Used     Comment: smoked in high school for about a year  . Alcohol use Yes   Additional social history: former smoker. Former alcohol abuse, none in ~1 year.  Please also refer to relevant sections of EMR.  Family History: No family h/o renal issues. Family h/o cardiac issues- HTN- noted.   Allergies and Medications: Allergies  Allergen Reactions  . Potassium-Containing Compounds Other (See Comments)    Reaction:  Stomach pain   . Doxycycline Other (See Comments)    Reaction:  Right side pain   . Augmentin [Amoxicillin-Pot Clavulanate] Nausea Only and Other (See Comments)    Has patient had a PCN reaction causing immediate rash, facial/tongue/throat swelling, SOB or lightheadedness with hypotension: No Has patient had a PCN reaction causing severe rash involving mucus membranes  or skin necrosis: No Has patient had a PCN reaction that required hospitalization No Has patient had a PCN reaction occurring within the last 10 years: Yes If all of the above answers are "NO", then may proceed with Cephalosporin use.  [name] Neomycin Rash   No current facility-administered medications on file prior to encounter.    Current Outpatient Prescriptions on File Prior to Encounter  Medication Sig Dispense Refill  . aspirin EC 81 MG EC tablet Take 1 tablet (81 mg total) by mouth daily. 30 tablet 11  . diltiazem (CARDIZEM CD) 180 MG 24 hr capsule Take 180 mg by mouth daily.    [name] gabapentin (NEURONTIN) 100 MG capsule Take 300 mg by mouth 3 (three) times daily.      [name] spironolactone (ALDACTONE) 100 MG tablet Take 2 tablets (200 mg total) by mouth daily. 60 tablet 12  . vancomycin 1,500 mg in sodium chloride 0.9 % 500 mL Inject 1,500 mg into the vein daily. 6 Dose 0  . metFORMIN (GLUMETZA) 1000 MG (MOD) 24 hr tablet Take 1 tablet (1,000 mg total) by mouth daily with breakfast. (Patient not taking: Reported on 06/01/2016) 30 tablet 1    Objective: BP 125/73 (BP Location: Left Arm)   Pulse 97   Temp 98.3 F (36.8 C) (Oral)   Resp 18   Ht 5\' 10"  (1.778 m)   Wt (!) 433 lb 3.2 oz (196.5 kg) Comment: scale b  SpO2 93%   BMI 62.16 kg/m  Exam: General: NAD, morbidly obese male, pleasant. Eyes: EOMI, PERRLA. ENTM: mucosa pink and moist. No erythema. Tonsils surgically absent. TMs normal without signs of infection. No cervical LAD.  Cardiovascular: Irregularly irregular rhythm, normal rate,  no m/r/g Respiratory: CTAB, good air movement however technically difficult exam given habitus.  Abdomen: large pannus w/ marked lichenification of skin on the inferior aspect, soft umbilical hernia not reducible and nontender, no CVA tenderness Extremities: 2+ LE edema, hyperpigmentation consistent with chronic venous stasis, R leg erythematous and warm up to lateral knee. Nonpurlent. Bilateral lateral midfoot eschars without erythema, warmth, or drainage  Neuro: grossly intact, follows commands Psych: appropriate mood and affect      Labs and Imaging: CBC BMET   Recent Labs Lab 06/01/16 0345  WBC 23.8*  HGB 14.9  HCT 44.4  PLT 366    Recent Labs Lab 06/01/16 0345  NA 134*  K 4.3  CL 96*  CO2 29  BUN 22*  CREATININE 1.47*  GLUCOSE 129*  CALCIUM 8.4*     Urinalysis    Component Value Date/Time   COLORURINE YELLOW 06/01/2016 0441   APPEARANCEUR CLEAR 06/01/2016 0441   LABSPEC 1.013 06/01/2016 0441   PHURINE 6.5 06/01/2016 0441   GLUCOSEU NEGATIVE 06/01/2016 0441   HGBUR MODERATE (A) 06/01/2016 0441   BILIRUBINUR NEGATIVE 06/01/2016 0441    BILIRUBINUR NEG 03/26/2016 1556   KETONESUR NEGATIVE 06/01/2016 0441   PROTEINUR NEGATIVE 06/01/2016 0441   UROBILINOGEN 0.2 03/26/2016 1556   UROBILINOGEN 1.0 05/02/2015 2052   NITRITE NEGATIVE 06/01/2016 0441   LEUKOCYTESUR MODERATE (A) 06/01/2016 0441      Dg Chest 2 View  Result Date: 06/01/2016 CLINICAL DATA:  Acute onset of sepsis.  Initial encounter. EXAM: CHEST  2 VIEW COMPARISON:  Chest radiograph performed 05/26/2016 FINDINGS: A small right pleural effusion is noted. Bibasilar airspace opacities may reflect pulmonary edema or pneumonia. No pneumothorax is seen. The cardiomediastinal silhouette is enlarged. No acute osseous abnormalities are identified. A right PICC is noted ending  about the mid SVC. IMPRESSION: Small right pleural effusion noted. Bibasilar airspace opacities may reflect pulmonary edema or pneumonia. Cardiomegaly. Electronically Signed   By: Garald Balding M.D.   On: 06/01/2016 04:49     Archie Patten, MD 06/01/2016, 9:39 AM PGY-3, [place] Intern pager: [phone], text pages welcome

## 2016-06-01 NOTE — Progress Notes (Signed)
Lactic acid 2.2 called in by Ms. MacCauled from lab and at 1050 lactic acid  2.9 called in by Jeannette Corpus from lab.  Notified Dr Lindell Noe x2 at they were called in.  No new order.  Will continue to monitor.  Karie Kirks, Therapist, sports.

## 2016-06-01 NOTE — Consult Note (Signed)
[facility] for Infectious Disease       Reason for Consult: fever    Referring Physician: Dr. Ree Kida  Active Problems:   Sepsis ([place])   . aspirin EC  81 mg Oral Daily  . ceFEPime (MAXIPIME) IV  2 g Intravenous Q8H  . diltiazem  180 mg Oral Daily  . enoxaparin (LOVENOX) injection  100 mg Subcutaneous Q24H  . gabapentin  300 mg Oral TID  . insulin aspart  0-9 Units Subcutaneous TID WC  . nystatin   Topical TID  . sodium chloride flush  3 mL Intravenous Q12H    Recommendations: Pull picc line and place periperhal IV Continue vancomycin and cefepime   Assessment: He has acute onset rigors, elevated lactate,  Fever, concerning for picc line infection.  He is done with his vancomycin for MRSA bacteremia tomorrow so would continue for now. Blood cultures sent.   Also has increased blanching erythema of leg concerning for cellulitis though is not particularly warm or tender.    Bacillus in 1 blood culture c/w contaminate and covered by vancomycin regardless.   HIV, hepatitis C previously negative  Antibiotics: Vancomycin and cefepime  HPI: Matthew [name] is a 60 y.o. male with recent cellulitis and MRSA bacteremia on vancomycin through 9/4 who presented overnight with acute onset rigors and fever.  Some nausea and WBC of 23.8, lactate 2.88.  Started on empiric cefepime and blood cultures sent.  Also had a positive blood culture for Bacillus species felt c/w contaminate.  Compliant with vancomycin.    Review of Systems:  Constitutional: negative for anorexia Gastrointestinal: negative for diarrhea Integument/breast: positive for rash in leg he feels is cellulitis returning All other systems reviewed and are negative   Past Medical History:  Diagnosis Date  . Atrial fibrillation, chronic ([place])   . CHF (congestive heart failure) ([place])   . GERD (gastroesophageal reflux disease)   . Hypertension   . Septic shock ([place])   . Shortness of breath dyspnea     Social  History  Substance Use Topics  . Smoking status: Former Smoker    Types: Cigarettes    Quit date: 11/01/1974  . Smokeless tobacco: Never Used     Comment: smoked in high school for about a year  . Alcohol use Yes    No family history on file.  Allergies  Allergen Reactions  . Potassium-Containing Compounds Other (See Comments)    Reaction:  Stomach pain   . Doxycycline Other (See Comments)    Reaction:  Right side pain   . Augmentin [Amoxicillin-Pot Clavulanate] Nausea Only and Other (See Comments)    Has patient had a PCN reaction causing immediate rash, facial/tongue/throat swelling, SOB or lightheadedness with hypotension: No Has patient had a PCN reaction causing severe rash involving mucus membranes or skin necrosis: No Has patient had a PCN reaction that required hospitalization No Has patient had a PCN reaction occurring within the last 10 years: Yes If all of the above answers are "NO", then may proceed with Cephalosporin use.  [name] Neomycin Rash    Physical Exam: Constitutional: in no apparent distress  Vitals:   06/01/16 0626 06/01/16 1131  BP: 125/73 112/65  Pulse: 97   Resp: 18   Temp: 98.3 F (36.8 C)    EYES: anicteric ENMT: no thrush Cardiovascular: Cor RRR Respiratory: CTA B; normal respiratory effort GI: morbidly obese Musculoskeletal: chronic changes of vascular insufficiency, blanching erythema of right leg past line Skin: negatives: no rash  Hematologic: no cervical lad  Lab Results  Component Value Date   WBC 23.8 (H) 06/01/2016   HGB 14.9 06/01/2016   HCT 44.4 06/01/2016   MCV 95.5 06/01/2016   PLT 366 06/01/2016    Lab Results  Component Value Date   CREATININE 1.47 (H) 06/01/2016   BUN 22 (H) 06/01/2016   NA 134 (L) 06/01/2016   K 4.3 06/01/2016   CL 96 (L) 06/01/2016   CO2 29 06/01/2016    Lab Results  Component Value Date   ALT 23 06/01/2016   AST 43 (H) 06/01/2016   ALKPHOS 144 (H) 06/01/2016     Microbiology: Recent Results  (from the past 240 hour(s))  Culture, body fluid-bottle     Status: None   Collection Time: 05/23/16  4:09 PM  Result Value Ref Range Status   Specimen Description FLUID RIGHT PLEURAL  Final   Special Requests BOTTLES DRAWN AEROBIC AND ANAEROBIC 10CC  Final   Culture NO GROWTH 5 DAYS  Final   Report Status 05/28/2016 FINAL  Final  Gram stain     Status: None   Collection Time: 05/23/16  4:09 PM  Result Value Ref Range Status   Specimen Description FLUID RIGHT PLEURAL  Final   Special Requests NONE  Final   Gram Stain   Final    WBC PRESENT, PREDOMINANTLY MONONUCLEAR NO ORGANISMS SEEN CYTOSPIN    Report Status 05/24/2016 FINAL  Final  Culture, blood (routine x 2)     Status: None   Collection Time: 05/24/16  1:30 PM  Result Value Ref Range Status   Specimen Description BLOOD RIGHT HAND  Final   Special Requests IN PEDIATRIC BOTTLE 3CC  Final   Culture NO GROWTH 5 DAYS  Final   Report Status 05/29/2016 FINAL  Final  Culture, blood (routine x 2)     Status: None   Collection Time: 05/24/16  1:40 PM  Result Value Ref Range Status   Specimen Description BLOOD RIGHT HAND  Final   Special Requests IN PEDIATRIC BOTTLE 1.5CC  Final   Culture NO GROWTH 5 DAYS  Final   Report Status 05/29/2016 FINAL  Final    Jahmar Mckelvy, [name], [facility] [email or web address] [phone] pager  [phone] cell 06/01/2016, 1:36 PM

## 2016-06-01 NOTE — ED Provider Notes (Signed)
[facility] DEPT Provider Note   CSN: [id number] Arrival date & time: 06/01/16  0321     History   Chief Complaint Chief Complaint  Patient presents with  . Fever  . Chills    HPI Matthew [name] is a 60 y.o. male.  Patient with recent admission for MRSA bacteremia and sepsis, cellulitis (right leg), CAP, discharged home on 05/30/16 with PICC line on Vancomycin presents with symptoms similar to previous presentation of fever and chills only. He denies cough, chest pain, abdominal pain, nausea, diarrhea, congestion, sore throat, dysuria. He reports he felt fine on day of discharge home and started feeling poorly again today. He states he has been compliant with PICC Vancomycin at home. He has a normal appetite.   The history is provided by the patient. No language interpreter was used.    Past Medical History:  Diagnosis Date  . Atrial fibrillation, chronic ([place])   . CHF (congestive heart failure) ([place])   . GERD (gastroesophageal reflux disease)   . Hypertension   . Septic shock ([place])   . Shortness of breath dyspnea     Patient Active Problem List   Diagnosis Date Noted  . MRSA bacteremia 05/19/2016  . Sepsis due to methicillin resistant Staphylococcus aureus (MRSA) ([place])   . Cellulitis of right leg 05/18/2016  . CAP (community acquired pneumonia) 05/17/2016  . Neuropathic pain of both feet ([place]) 05/02/2016  . Urinary hesitancy 03/26/2016  . Cellulitis of leg, right 01/17/2016  . Recurrent pleural effusion on right 12/13/2015  . GERD (gastroesophageal reflux disease) 09/27/2015  . Rotator cuff (capsule) sprain 09/15/2015  . Leg edema 08/05/2015  . Diabetic ulcer of right foot associated with type 2 diabetes mellitus ([place])   . Paroxysmal atrial fibrillation (HCC)   . AKI (acute kidney injury) ([place])   . Depression, major, recurrent, moderate ([place]) 03/21/2015  . Alcoholic cirrhosis of liver with ascites ([place])   . Pleural effusion   . Essential hypertension   .  Pannus, abdominal 02/23/2014  . 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 Poor social situation 11/11/2013  . Alcohol abuse, in remission 11/03/2013  . DM type 2 (diabetes mellitus, type 2) ([place]) 03/25/2012  . Morbid obesity ([place]) 03/25/2012  . Sleep apnea 03/25/2012    Past Surgical History:  Procedure Laterality Date  . HERNIA REPAIR    . KNEE ARTHROSCOPY    . SPLENECTOMY    . TEE WITHOUT CARDIOVERSION N/A 05/21/2016   Procedure: TRANSESOPHAGEAL ECHOCARDIOGRAM (TEE);  Surgeon: Larey Dresser, MD;  Location: [facility];  Service: Cardiovascular;  Laterality: N/A;  . TONSILECTOMY, ADENOIDECTOMY, BILATERAL MYRINGOTOMY AND TUBES         Home Medications    Prior to Admission medications   Medication Sig Start Date End Date Taking? Authorizing Provider  aspirin EC 81 MG EC tablet Take 1 tablet (81 mg total) by mouth daily. 05/27/16   Sela Hilding, MD  diltiazem (CARDIZEM CD) 180 MG 24 hr capsule Take 180 mg by mouth daily.    Historical Provider, MD  gabapentin (NEURONTIN) 100 MG capsule Take 100 mg by mouth 3 (three) times daily.    Historical Provider, MD  metFORMIN (GLUMETZA) 1000 MG (MOD) 24 hr tablet Take 1 tablet (1,000 mg total) by mouth daily with breakfast. 05/27/16   Sela Hilding, MD  spironolactone (ALDACTONE) 100 MG tablet Take 2 tablets (200 mg total) by mouth daily. 05/07/16   Zenia Resides, MD  torsemide (DEMADEX) 20 MG tablet Take 2 tablets (40 mg  total) by mouth 2 (two) times daily. 05/27/16   Sela Hilding, MD  vancomycin 1,500 mg in sodium chloride 0.9 % 500 mL Inject 1,500 mg into the vein daily. 05/28/16 06/03/16  Sela Hilding, MD    Family History No family history on file.  Social History Social History  Substance Use Topics  . Smoking status: Former Smoker    Types: Cigarettes    Quit date: 11/01/1974  . Smokeless tobacco: Never Used     Comment: smoked in high school for about a year  . Alcohol use Yes     Allergies     Potassium-containing compounds; Doxycycline; Augmentin [amoxicillin-pot clavulanate]; and Neomycin   Review of Systems Review of Systems  Constitutional: Positive for chills and fever.  HENT: Negative.   Respiratory: Negative.   Cardiovascular: Negative.   Gastrointestinal: Negative.   Musculoskeletal: Negative.   Skin: Negative.   Neurological: Negative.      Physical Exam Updated Vital Signs BP 123/57 (BP Location: Left Arm)   Pulse 108   Temp 102.1 F (38.9 C) (Rectal)   Resp 22   SpO2 94%   Physical Exam  Constitutional: He appears well-developed and well-nourished.  HENT:  Head: Normocephalic.  Mouth/Throat: Mucous membranes are dry.  Neck: Normal range of motion. Neck supple.  Cardiovascular: Normal rate and regular rhythm.   Pulmonary/Chest: Effort normal and breath sounds normal.  Breath sounds diminished.  Abdominal: Soft. Bowel sounds are normal. There is no tenderness. There is no rebound and no guarding.  Morbidly obese.  Musculoskeletal: Normal range of motion.  Right LE red, warm to touch but non-tender. Distal pulses intact.   Neurological: He is alert. No cranial nerve deficit.  Skin: Skin is warm and dry. No rash noted.  Psychiatric: He has a normal mood and affect.     ED Treatments / Results  Labs (all labs ordered are listed, but only abnormal results are displayed) Labs Reviewed  CBC WITH DIFFERENTIAL/PLATELET - Abnormal; Notable for the following:       Result Value   WBC 23.8 (*)    RDW 15.9 (*)    Neutro Abs 20.3 (*)    Monocytes Absolute 2.2 (*)    All other components within normal limits  I-STAT CG4 LACTIC ACID, ED - Abnormal; Notable for the following:    Lactic Acid, Venous 2.88 (*)    All other components within normal limits  CULTURE, BLOOD (ROUTINE X 2)  CULTURE, BLOOD (ROUTINE X 2)  URINE CULTURE  COMPREHENSIVE METABOLIC PANEL  URINALYSIS, ROUTINE W REFLEX MICROSCOPIC (NOT AT [facility])  I-STAT BETA HCG BLOOD, ED ([facility], [facility], [facility]  ONLY)    EKG  EKG Interpretation None       Radiology No results found.  Procedures Procedures (including critical care time)  Medications Ordered in ED Medications  acetaminophen (TYLENOL) tablet 1,000 mg (not administered)     Initial Impression / Assessment and Plan / ED Course  I have reviewed the triage vital signs and the nursing notes.  Pertinent labs & imaging results that were available during my care of the patient were reviewed by me and considered in my medical decision making (see chart for details).  Clinical Course    Patient with recent sepsis (CAP, cellulitis, MRSA) presents 2 days after discharge with identical symptoms as that illness. At home on PICC Vanc. No pain  He is found to be febrile, slightly tachycardic, normotensive. Lactate 2.88. Will call for admission with [facility].  Final Clinical  Impressions(s) / ED Diagnoses   Final diagnoses:  None   1. Recurrent sepsis  New Prescriptions New Prescriptions   No medications on file     Charlann Lange, PA-C 06/01/16 [place], MD 06/01/16 [phone]

## 2016-06-01 NOTE — Progress Notes (Signed)
Pharmacy Antibiotic Note  Matthew [name] is a 60 y.o. male admitted on 06/01/2016 with fever/chills, recurrent sepsis.  Pharmacy has been consulted for Vancomycin and Cefepime dosing.  Pt currently on Vancomycin 1500 mg IV q24h (Day #13/14) for MRSA bacteremia.  Last dose given ~2:30 pm 9/2  Plan: Cefepime 2 g IV q8h Check Vancomycin trough this afternoon  Height: 5\' 10"  (177.8 cm) Weight: (!) 433 lb 3.2 oz (196.5 kg) (scale b) IBW/kg (Calculated) : 73  Temp (24hrs), Avg:100.4 F (38 C), Min:98.3 F (36.8 C), Max:102.1 F (38.9 C)   Recent Labs Lab 05/25/16 0947 05/26/16 0519 05/26/16 1638 05/27/16 0435 06/01/16 0345 06/01/16 0413  WBC  --   --  16.6* 14.8* 23.8*  --   CREATININE 1.35* 1.48*  --  1.51* 1.47*  --   LATICACIDVEN  --   --   --   --   --  2.88*    Estimated Creatinine Clearance: 93.7 mL/min (by C-G formula based on SCr of 1.47 mg/dL).    Allergies  Allergen Reactions  . Potassium-Containing Compounds Other (See Comments)    Reaction:  Stomach pain   . Doxycycline Other (See Comments)    Reaction:  Right side pain   . Augmentin [Amoxicillin-Pot Clavulanate] Nausea Only and Other (See Comments)    Has patient had a PCN reaction causing immediate rash, facial/tongue/throat swelling, SOB or lightheadedness with hypotension: No Has patient had a PCN reaction causing severe rash involving mucus membranes or skin necrosis: No Has patient had a PCN reaction that required hospitalization No Has patient had a PCN reaction occurring within the last 10 years: Yes If all of the above answers are "NO", then may proceed with Cephalosporin use.  [name] Neomycin Rash    Caryl Pina 06/01/2016 6:36 AM

## 2016-06-01 NOTE — Progress Notes (Signed)
Pharmacy Antibiotic Note  Matthew [name] is a 60 y.o. male admitted on 06/01/2016 with fever/chills, recurrent sepsis.  Pharmacy has been consulted for Vancomycin and Cefepime dosing. ID following and new cultures pending -vancomycin trough= 14 (drawn ~ 2 hours late). Estimate that actual trough is at goal (15-20)  Pt currently on Vancomycin 1500 mg IV q24h (Day #13/14) for MRSA bacteremia.  Last dose given ~2:30 pm 9/2  Plan: Continue vancomycin 1500mg  IV q24h Cefepime 2 g IV q8h   Height: 5\' 10"  (177.8 cm) Weight: (!) 433 lb 3.2 oz (196.5 kg) (scale b) IBW/kg (Calculated) : 73  Temp (24hrs), Avg:99.9 F (37.7 C), Min:98.3 F (36.8 C), Max:102.1 F (38.9 C)   Recent Labs Lab 05/26/16 0519 05/26/16 1638 05/27/16 0435 06/01/16 0345 06/01/16 0413 06/01/16 0704 06/01/16 1000 06/01/16 1545  WBC  --  16.6* 14.8* 23.8*  --   --   --   --   CREATININE 1.48*  --  1.51* 1.47*  --   --   --   --   LATICACIDVEN  --   --   --   --  2.88* 2.2* 2.9*  --   VANCOTROUGH  --   --   --   --   --   --   --  14*    Estimated Creatinine Clearance: 93.7 mL/min (by C-G formula based on SCr of 1.47 mg/dL).    Allergies  Allergen Reactions  . Potassium-Containing Compounds Other (See Comments)    Reaction:  Stomach pain   . Doxycycline Other (See Comments)    Reaction:  Right side pain   . Augmentin [Amoxicillin-Pot Clavulanate] Nausea Only and Other (See Comments)    Has patient had a PCN reaction causing immediate rash, facial/tongue/throat swelling, SOB or lightheadedness with hypotension: No Has patient had a PCN reaction causing severe rash involving mucus membranes or skin necrosis: No Has patient had a PCN reaction that required hospitalization No Has patient had a PCN reaction occurring within the last 10 years: Yes If all of the above answers are "NO", then may proceed with Cephalosporin use.  [name] Neomycin Rash    Antimicrobials this admission: 8/19 vanc (started last admission  and on PTA)>>   Dose adjustments this admission: -9/3 VT = 14 at ~ 4pm (last dose 2:30pm on 9/2). Level drawn 2 hours late -8/25: VT= 23 and changed from 1750mg  IV q24hr to 1500mg  IV q24hr  Microbiology results: 9/3 urine 9/3 blood 9/3 cath tip 8/19  blood MRSA 1/2  Hildred Laser, Pharm D 06/01/2016 5:08 PM

## 2016-06-01 NOTE — Progress Notes (Signed)
CPAP set up at patient's bedside. Patient states that he will place it on when ready. RT informed him to let RN know if he has any trouble so respiratory can come help. Patient acknowledged and showed understanding. RT will continue to monitor as needed.

## 2016-06-01 NOTE — ED Notes (Signed)
Patient returned from xray.

## 2016-06-01 NOTE — ED Triage Notes (Signed)
Pt was dc 2 days ago [facility]. Was admitted for Sepsis for PNA, MRSA, and cellulitis

## 2016-06-02 DIAGNOSIS — K7031 Alcoholic cirrhosis of liver with ascites: Secondary | ICD-10-CM

## 2016-06-02 DIAGNOSIS — E11628 Type 2 diabetes mellitus with other skin complications: Secondary | ICD-10-CM

## 2016-06-02 DIAGNOSIS — I872 Venous insufficiency (chronic) (peripheral): Secondary | ICD-10-CM

## 2016-06-02 LAB — CBC
HEMATOCRIT: 42.9 % (ref 39.0–52.0)
HEMOGLOBIN: 14 g/dL (ref 13.0–17.0)
MCH: 31.5 pg (ref 26.0–34.0)
MCHC: 32.6 g/dL (ref 30.0–36.0)
MCV: 96.4 fL (ref 78.0–100.0)
Platelets: 335 10*3/uL (ref 150–400)
RBC: 4.45 MIL/uL (ref 4.22–5.81)
RDW: 16.2 % — ABNORMAL HIGH (ref 11.5–15.5)
WBC: 20 10*3/uL — ABNORMAL HIGH (ref 4.0–10.5)

## 2016-06-02 LAB — GLUCOSE, CAPILLARY
GLUCOSE-CAPILLARY: 132 mg/dL — AB (ref 65–99)
GLUCOSE-CAPILLARY: 96 mg/dL (ref 65–99)
GLUCOSE-CAPILLARY: 130 mg/dL — AB (ref 65–99)
Glucose-Capillary: 117 mg/dL — ABNORMAL HIGH (ref 65–99)

## 2016-06-02 LAB — URINE CULTURE: Culture: NO GROWTH

## 2016-06-02 LAB — BASIC METABOLIC PANEL
Anion gap: 8 (ref 5–15)
BUN: 26 mg/dL — AB (ref 6–20)
CHLORIDE: 99 mmol/L — AB (ref 101–111)
CO2: 29 mmol/L (ref 22–32)
CREATININE: 1.59 mg/dL — AB (ref 0.61–1.24)
Calcium: 8.3 mg/dL — ABNORMAL LOW (ref 8.9–10.3)
GFR calc Af Amer: 53 mL/min — ABNORMAL LOW (ref >60)
GFR calc non Af Amer: 46 mL/min — ABNORMAL LOW (ref >60)
GLUCOSE: 125 mg/dL — AB (ref 65–99)
POTASSIUM: 4.1 mmol/L (ref 3.5–5.1)
SODIUM: 136 mmol/L (ref 135–145)

## 2016-06-02 MED ORDER — FUROSEMIDE 10 MG/ML IJ SOLN
40.0000 mg | Freq: Two times a day (BID) | INTRAMUSCULAR | Status: DC
Start: 2016-06-02 — End: 2016-06-03
  Administered 2016-06-02 – 2016-06-03 (×2): 40 mg via INTRAVENOUS
  Filled 2016-06-02 (×2): qty 4

## 2016-06-02 MED ORDER — SPIRONOLACTONE 25 MG PO TABS
100.0000 mg | ORAL_TABLET | Freq: Every day | ORAL | Status: DC
  Administered 2016-06-02 – 2016-06-06 (×5): 100 mg via ORAL
  Filled 2016-06-02 (×5): qty 4

## 2016-06-02 NOTE — Progress Notes (Signed)
Informed pt of orders to place ted hose. Pt refuses and does not wish to have ace bandage wraps placed at this time. Pt instructed on importance of ace bandage wraps and keeping legs elevated. Pt verbalized understanding. Will continue to encourage elevation and ace bandage wraps.

## 2016-06-02 NOTE — Progress Notes (Signed)
Pt states that he doesn't need any help setting up his NIV for the night. O2 humidity provided per RT. Told pt if have any problems with NIV machine  to please let RN know to notify RT. Education given on proper use of NIV machine. Pt is stable at this time.

## 2016-06-02 NOTE — Progress Notes (Signed)
Placed patient on CPAP for the night. RT will continue to monitor as needed.

## 2016-06-02 NOTE — Progress Notes (Signed)
Family Medicine Teaching Service Daily Progress Note Intern Pager: [phone]  Patient name: Matthew [name] Medical record number: [id number] Date of birth: November 18, 1955 Age: 60 y.o. Gender: male  Primary Care Provider: Junie Panning, DO Consultants: ID Code Status: Full  Pt Overview and Major Events to Date:  9/3: Admitted for sepsis; PICC line removed/cultured.  Assessment and Plan: LANNIS HEIMBUCH is a 60 y.o. male presenting with rigors currently being treated for MRSA bacteremia . PMH is significant for DMII, morbid obesity, alcoholic cirrhosis, alcohol abuse in remission, HTN, depression, atrial fibrillation, and GERD.   Sepsis/bacteremia:  Likely secondary to PICC line, differential for source would also include lower extremity cellulitis, however according to previous notes this seems stable from discharge physical exam. CXR with small R pleural effusion and bibasilar airspace opacities. No vegetations were seen on TEE.  - S/p sepsis protocol. - Continue Vanc; Restart cefepime.  - Consult ID, appreciate recs: Pull PICC, continue abx - Blood/urine/PICC cultures pending - Tylenol PRN for fever:  afebrile >24hr  Bilateral Leg Edema: Likely multifactorial with alcoholic cirrhosis, venous stasis (reduced ambulation), and venous stasis (pannus compression of the femoral veins). Bilateral pitting edema. Lower extremity erythema diffuse below the knee. - Nursing orders to elevate legs whenever stationary - TED hose at all times. The patient does not tolerate these devices then Ace bandages should be used as a second line therapy. - Lasix 40 mg twice a day (increased from yesterday) - We'll monitor  R pleural effusion: Small, thought ought to be secondary to end stage liver diease. This was drained at previous hospitalization and found to be transudative. Seems to be much smaller than previous now. - continue to monitor   Alcoholic cirrhosis: Wt 123XX123 on admissoin (was 425lb on  discharge).  Abdominal US with cirrhosis and probable steatosis without focal liver lesions noted on 8/20.  Echo EF 65%, mild basal septal LV hypertrophy, indeterminate diastolic function 2/2 afib.  - holding torsemide. - IV Lasix as above - Spironolactone 100 mg daily  - Strict I/Os - Daily weights  DMII: Diet controlled. Cover with SSI. Most recent A1C 6.9 in 05/18/16.  - Discussed importance of starting metformin as outpatient vs dietary changes. - continue home neurontin - sensitive sliding scale insulin - monitor CBGs  AKI, improving: Cr baseline 1.08, however was 1.51 on discharge. Curious if vancomycin could be contributing.  - Cr: 1.59 - continue to monitor  HTN: currently normotensive, HCTZ was discontinued during last hospitalization. - Spironolactone restarted today (may help with this some) - continue to monitor.   Afib: Rate controlled on dilt, will continue. CHADvasc score is 2, not on anticoagulation due to end stage liver failure.   -continue dilt w/ holding parameters SBP<110, DBP<60  OSA: Uses CPAP at home.  -continue CPAP qhs   FEN/GI: Carb modified diet Prophylaxis: Lovenox  Disposition: Pending fluid and infection status.    Subjective:  No major issues overnight. Many questions about his legs and cellulitis. Patient seems relatively pessimistic, and quick to take provider's words with a negative spin.   Objective: Temp:  [97.6 F (36.4 C)-98.2 F (36.8 C)] 98.1 F (36.7 C) (09/04 1157) Pulse Rate:  [80-90] 90 (09/04 1157) Resp:  [18-21] 20 (09/04 1157) BP: (146-163)/(72-74) 146/72 (09/04 1157) SpO2:  [94 %-98 %] 97 % (09/04 1157) Weight:  [438 lb 3.2 oz (198.8 kg)] 438 lb 3.2 oz (198.8 kg) (09/04 0552) Physical Exam: General: NAD, morbidly obese male, sitting up at side of bed HEENT:  EOMI, PERRLA. MMM  Cardiovascular:Relatively irregularly irregular rhythm, normal rate,  no m/r/g Respiratory: difficult exam given habitus; some possible  crackles on R-side; could not r/o atelectasis. Abdomen: large pannus w/ marked lichenification of skin on the inferior aspect Extremities: 3+ LE edema, hyperpigmentation (L>R) and erythema (R>L). Nonpurlent. Bilateral lateral midfoot eschars without erythema, warmth, or drainage  Neuro: grossly intact, follows commands  Laboratory:  Recent Labs Lab 05/27/16 0435 06/01/16 0345 06/02/16 0329  WBC 14.8* 23.8* 20.0*  HGB 13.7 14.9 14.0  HCT 40.3 44.4 42.9  PLT 454* 366 335    Recent Labs Lab 05/27/16 0435 06/01/16 0345 06/02/16 0329  NA 137 134* 136  K 3.6 4.3 4.1  CL 97* 96* 99*  CO2 33* 29 29  BUN 24* 22* 26*  CREATININE 1.51* 1.47* 1.59*  CALCIUM 8.8* 8.4* 8.3*  PROT  --  8.1  --   BILITOT  --  1.2  --   ALKPHOS  --  144*  --   ALT  --  23  --   AST  --  43*  --   GLUCOSE 88 129* 125*    Elberta Leatherwood, MD 06/02/2016, 1:24 PM PGY-3, [place] Intern pager: [phone], text pages welcome

## 2016-06-03 DIAGNOSIS — B9689 Other specified bacterial agents as the cause of diseases classified elsewhere: Secondary | ICD-10-CM

## 2016-06-03 DIAGNOSIS — Z8614 Personal history of Methicillin resistant Staphylococcus aureus infection: Secondary | ICD-10-CM

## 2016-06-03 DIAGNOSIS — Z95828 Presence of other vascular implants and grafts: Secondary | ICD-10-CM

## 2016-06-03 LAB — CBC
HCT: 43.7 % (ref 39.0–52.0)
HEMOGLOBIN: 14.5 g/dL (ref 13.0–17.0)
MCH: 31.9 pg (ref 26.0–34.0)
MCHC: 33.2 g/dL (ref 30.0–36.0)
MCV: 96 fL (ref 78.0–100.0)
PLATELETS: 386 10*3/uL (ref 150–400)
RBC: 4.55 MIL/uL (ref 4.22–5.81)
RDW: 16.1 % — ABNORMAL HIGH (ref 11.5–15.5)
WBC: 13.5 10*3/uL — AB (ref 4.0–10.5)

## 2016-06-03 LAB — BASIC METABOLIC PANEL
ANION GAP: 7 (ref 5–15)
BUN: 25 mg/dL — ABNORMAL HIGH (ref 6–20)
CHLORIDE: 99 mmol/L — AB (ref 101–111)
CO2: 31 mmol/L (ref 22–32)
Calcium: 8.5 mg/dL — ABNORMAL LOW (ref 8.9–10.3)
Creatinine, Ser: 1.47 mg/dL — ABNORMAL HIGH (ref 0.61–1.24)
GFR calc Af Amer: 59 mL/min — ABNORMAL LOW (ref >60)
GFR, EST NON AFRICAN AMERICAN: 50 mL/min — AB (ref >60)
GLUCOSE: 100 mg/dL — AB (ref 65–99)
POTASSIUM: 4.9 mmol/L (ref 3.5–5.1)
SODIUM: 137 mmol/L (ref 135–145)

## 2016-06-03 LAB — GLUCOSE, CAPILLARY
GLUCOSE-CAPILLARY: 107 mg/dL — AB (ref 65–99)
GLUCOSE-CAPILLARY: 88 mg/dL (ref 65–99)
Glucose-Capillary: 87 mg/dL (ref 65–99)
Glucose-Capillary: 120 mg/dL — ABNORMAL HIGH (ref 65–99)

## 2016-06-03 MED ORDER — FUROSEMIDE 10 MG/ML IJ SOLN
40.0000 mg | Freq: Two times a day (BID) | INTRAMUSCULAR | Status: DC
  Administered 2016-06-03 (×2): 40 mg via INTRAVENOUS
  Filled 2016-06-03 (×2): qty 4

## 2016-06-03 NOTE — Progress Notes (Signed)
INFECTIOUS DISEASE PROGRESS NOTE  ID: Matthew [name] is a 60 y.o. male with  Active Problems:   Sepsis ([place])   Chronic venous insufficiency  Subjective: [name] with DM2 with CRI, alcoholic cirrhosis, and prev MRSA bacteremia adm on 8-20. His bacteremia was felt to be due to RLE cellulitis. He had a TEE (-).  He was d/c home on 8-28 to complete vanco on 9-4.  He returns on 9-3 with fever 102.1, elevated lactate and WBC 23.  He had cefepime added. His PIC was removed. He has since defervesced.  His WBC has improved to 13.5. His repeat BCx are ngtd.  Some pain at his prev [facility] site.   Abtx:  Anti-infectives    Start     Dose/Rate Route Frequency Ordered Stop   06/01/16 1800  vancomycin (VANCOCIN) 1,500 mg in sodium chloride 0.9 % 500 mL IVPB     1,500 mg 250 mL/hr over 120 Minutes Intravenous Every 24 hours 06/01/16 1706     06/01/16 0615  ceFEPIme (MAXIPIME) 2 g in dextrose 5 % 50 mL IVPB     2 g 100 mL/hr over 30 Minutes Intravenous Every 8 hours 06/01/16 0605        Medications:  Scheduled: . aspirin EC  81 mg Oral Daily  . ceFEPime (MAXIPIME) IV  2 g Intravenous Q8H  . diltiazem  180 mg Oral Daily  . enoxaparin (LOVENOX) injection  100 mg Subcutaneous Q24H  . furosemide  40 mg Intravenous Q12H  . gabapentin  300 mg Oral TID  . insulin aspart  0-9 Units Subcutaneous TID WC  . nystatin   Topical TID  . sodium chloride flush  3 mL Intravenous Q12H  . spironolactone  100 mg Oral Daily  . vancomycin  1,500 mg Intravenous Q24H    Objective: Vital signs in last 24 hours: Temp:  [97.8 F (36.6 C)-98.1 F (36.7 C)] 97.9 F (36.6 C) (09/05 0425) Pulse Rate:  [80-90] 87 (09/05 0425) Resp:  [18-20] 18 (09/05 0425) BP: (110-147)/(69-95) 110/95 (09/05 1011) SpO2:  [96 %-97 %] 96 % (09/05 0425) Weight:  [198.9 kg (438 lb 8 oz)] 198.9 kg (438 lb 8 oz) (09/05 0425)   General appearance: alert, cooperative and no distress Resp: clear to auscultation bilaterally Cardio:  regular rate and rhythm GI: normal findings: bowel sounds normal and soft, non-tender and abnormal findings:  distended and umbilical hernia Extremities: RUE pic site is clean. non-tender. RLE is massively swollen, wram, erythematous, non-tender, normal light touch.   Lab Results  Recent Labs  06/02/16 0329 06/03/16 0802  WBC 20.0* 13.5*  HGB 14.0 14.5  HCT 42.9 43.7  NA 136 137  K 4.1 4.9  CL 99* 99*  CO2 29 31  BUN 26* 25*  CREATININE 1.59* 1.47*   Liver Panel  Recent Labs  06/01/16 0345  PROT 8.1  ALBUMIN 2.2*  AST 43*  ALT 23  ALKPHOS 144*  BILITOT 1.2   Sedimentation Rate No results for input(s): ESRSEDRATE in the last 72 hours. C-Reactive Protein No results for input(s): CRP in the last 72 hours.  Microbiology: Recent Results (from the past 240 hour(s))  Culture, blood (routine x 2)     Status: None   Collection Time: 05/24/16  1:30 PM  Result Value Ref Range Status   Specimen Description BLOOD RIGHT HAND  Final   Special Requests IN PEDIATRIC BOTTLE 3CC  Final   Culture NO GROWTH 5 DAYS  Final  Report Status 05/29/2016 FINAL  Final  Culture, blood (routine x 2)     Status: None   Collection Time: 05/24/16  1:40 PM  Result Value Ref Range Status   Specimen Description BLOOD RIGHT HAND  Final   Special Requests IN PEDIATRIC BOTTLE 1.5CC  Final   Culture NO GROWTH 5 DAYS  Final   Report Status 05/29/2016 FINAL  Final  Culture, blood (Routine x 2)     Status: None (Preliminary result)   Collection Time: 06/01/16  4:00 AM  Result Value Ref Range Status   Specimen Description BLOOD RIGHT WRIST  Final   Special Requests BOTTLES DRAWN AEROBIC AND ANAEROBIC 5CC EA  Final   Culture NO GROWTH 1 DAY  Final   Report Status PENDING  Incomplete  Culture, blood (Routine x 2)     Status: None (Preliminary result)   Collection Time: 06/01/16  4:09 AM  Result Value Ref Range Status   Specimen Description BLOOD RIGHT HAND  Final   Special Requests BOTTLES DRAWN  AEROBIC ONLY 5CC  Final   Culture NO GROWTH 1 DAY  Final   Report Status PENDING  Incomplete  Urine culture     Status: None   Collection Time: 06/01/16  4:41 AM  Result Value Ref Range Status   Specimen Description URINE, CATHETERIZED  Final   Special Requests NONE  Final   Culture NO GROWTH  Final   Report Status 06/02/2016 FINAL  Final  Cath Tip Culture     Status: None (Preliminary result)   Collection Time: 06/01/16  4:00 PM  Result Value Ref Range Status   Specimen Description CATH TIP  Final   Special Requests NONE  Final   Culture NO GROWTH 2 DAYS  Final   Report Status PENDING  Incomplete    Studies/Results: No results found.   Assessment/Plan: Prev MRSA bacteremia Cellulitis Sepsis  Will continue his antibiotics Check LE doppler, vascular studies as a start Check RUE dopppler Consider possible drug reaction?  Total days of antibiotics: 15 vanco, 2 cefepime         Bobby Rumpf Infectious Diseases (pager) [phone] [email or web address] 06/03/2016, 10:15 AM  LOS: 2 days

## 2016-06-03 NOTE — Progress Notes (Signed)
[facility]  Patient Status:  Active pt with [facility] prior to this admission  [facility] is providing the following services: HHRN, OT, PT and Home Infusion Pharmacy team for home IV ABX.  [facility] team will follow Mr. Taitano until DC and support home care needs at DC as ordered by hospital team.   If patient discharges after hours, please call [phone].   Larry Sierras 06/03/2016, 2:57 PM

## 2016-06-03 NOTE — Progress Notes (Signed)
Noted pt iv infiltrated after change of shift.  Vanc was infusing.  IV consult place as pt is hard stick.  Night shift nurse made aware.  Daana Petrasek, Therapist, sports.

## 2016-06-03 NOTE — Progress Notes (Signed)
Family Medicine Teaching Service Daily Progress Note Intern Pager: [phone]  Patient name: Matthew [name] Medical record number: [id number] Date of birth: 1956/03/02 Age: 60 y.o. Gender: male  Primary Care Provider: Junie Panning, DO Consultants: ID Code Status: Full  Pt Overview and Major Events to Date:  9/3: Admitted for sepsis; PICC line removed/cultured.  Assessment and Plan: Matthew [name] is a 60 y.o. male presenting with rigors currently being treated for MRSA bacteremia . PMH is significant for DMII, morbid obesity, alcoholic cirrhosis, alcohol abuse in remission, HTN, depression, atrial fibrillation, and GERD.   Sepsis/bacteremia: Source PICC line vs continued LLE cellulitis. CXR with small R pleural effusion and bibasilar airspace opacities. No vegetations were seen on TEE.  -WBC 20>13.5 - S/p sepsis protocol > only given 2 L due to concerns for fluid overload at recent admission - Continue Vanc& cefepime - Consult ID, appreciate recs: Pull PICC, continue abx - Blood/urine/PICC cultures pending - Tylenol PRN for fever:  afebrile >24hr - if no new organisms grow from cultures, could consider renal US or CT abdomen to r/o renal abcess 2/2 MRSA in urine on recent admission although vanc should cover MRSA in urine  Bilateral Leg Edema: Likely multifactorial with alcoholic cirrhosis, venous stasis (reduced ambulation), and venous stasis (pannus compression of the femoral veins). Bilateral pitting edema. Lower extremity erythema diffuse below the knee. - Nursing orders to elevate legs whenever stationary - TED hose TO LEFT LEG ONLY at all times. The patient does not tolerate these devices then Ace bandages should be used as a second line therapy. - Lasix 40 mg twice a day (increased from yesterday) - Will monitor  R pleural effusion: Small, thought ought to be secondary to end stage liver diease. This was drained at previous hospitalization and found to be transudative.  Seems to be much smaller than previous now. - continue to monitor   Alcoholic cirrhosis: Wt 123XX123 on admission (was 425lb on discharge). Wt 438.  Abdominal US with cirrhosis and probable steatosis without focal liver lesions noted on 8/20.  Echo EF 65%, mild basal septal LV hypertrophy, indeterminate diastolic function 2/2 afib.  - holding torsemide. - IV Lasix as above - Spironolactone 100 mg daily  - Strict I/Os - Daily weights  DMII: Diet controlled. Cover with SSI. Most recent A1C 6.9 in 05/18/16. Newly rx'd metformin as an outpt on discharge last week. Nutrition consulted for dietary advice last admission. - continue home neurontin - sensitive sliding scale insulin - monitor CBGs  AKI, improving: Cr baseline 1.08, however was 1.51 on discharge. Curious if vancomycin could be contributing.  - Cr: 1.59>1.47 - continue to monitor  HTN: currently normotensive, HCTZ was discontinued during last hospitalization. - Spironolactone restarted 06/02/16 - continue to monitor.   Afib: Rate controlled on dilt, will continue. CHADvasc score is 2, not on anticoagulation due to end stage liver failure.   -continue dilt w/ holding parameters SBP<110, DBP<60  OSA: Uses CPAP at home.  -continue CPAP qhs  FEN/GI: Carb modified diet Prophylaxis: Lovenox  Disposition: Pending fluid and infection status.  Subjective:  No issues overnight. Pleasant this morning, amenable to trying TED hose vs ACE wraps on LLE. Also would be interested in surgery consult as an outpatient for abdominoplasty. Many questions about diuresis regimen.   Objective: Temp:  [97.8 F (36.6 C)-98.1 F (36.7 C)] 97.9 F (36.6 C) (09/05 0425) Pulse Rate:  [80-90] 87 (09/05 0425) Resp:  [18-20] 18 (09/05 0425) BP: (144-147)/(69-79) 144/69 (09/05 0425) SpO2:  [  96 %-97 %] 96 % (09/05 0425) Weight:  [438 lb 8 oz (198.9 kg)] 438 lb 8 oz (198.9 kg) (09/05 0425) Physical Exam: General: NAD, morbidly obese male, sitting up  at side of bed Cardiovascular:Relatively irregularly irregular rhythm, normal rate,  no m/r/g Respiratory: difficult exam given habitus; some possible crackles on R-side; could not r/o atelectasis. Abdomen: large pannus w/ marked lichenification of skin on the inferior aspect Extremities: 3+ LE edema, hyperpigmentation (L>R) and erythema (R>L). Nonpurlent. Bilateral lateral midfoot eschars without erythema, warmth, or drainage   Neuro: grossly intact, follows commands  Laboratory:  Recent Labs Lab 06/01/16 0345 06/02/16 0329 06/03/16 0802  WBC 23.8* 20.0* 13.5*  HGB 14.9 14.0 14.5  HCT 44.4 42.9 43.7  PLT 366 335 386    Recent Labs Lab 06/01/16 0345 06/02/16 0329 06/03/16 0802  NA 134* 136 137  K 4.3 4.1 4.9  CL 96* 99* 99*  CO2 29 29 31   BUN 22* 26* 25*  CREATININE 1.47* 1.59* 1.47*  CALCIUM 8.4* 8.3* 8.5*  PROT 8.1  --   --   BILITOT 1.2  --   --   ALKPHOS 144*  --   --   ALT 23  --   --   AST 43*  --   --   GLUCOSE 129* 125* 100*    Sela Hilding, MD 06/03/2016, 9:42 AM PGY-1, [place] Intern pager: [phone], text pages welcome

## 2016-06-03 NOTE — Progress Notes (Signed)
Pt stated he is independent when ambulating in the room and he don't want his bed alarm turned on, encouraged patient to call for assistance, if needed, will continue to monitor.

## 2016-06-03 NOTE — Evaluation (Addendum)
Physical Therapy Evaluation Patient Details Name: Matthew [name] MRN: [id number] DOB: [birth year] Today's Date: 06/03/2016   History of Present Illness  60 year old male admitted with RLE cellulitis, MRSA, sepsis, R pleural effusion. PMH: morbid obesity, type 2 diabetes, alcohol abuse, alcoholic cirrhosis.   Clinical Impression  Pt admitted with above diagnosis. Pt currently with functional limitations due to the deficits listed below (see PT Problem List). Pt ambulated 100' without assistive device, no loss of balance, HR 107 walking and SaO2 92% on RA, 2/4 dyspnea. Encouraged pt to elevate BLEs and perform BLE AROM exercises to reduce edema, also encouraged pt to walk at least 2x/day.  Pt will benefit from skilled PT to increase their independence and safety with mobility to allow discharge to the venue listed below.       Follow Up Recommendations Home health PT     Equipment Recommendations  Other (comment) (pt stated he needs diabetic shoes but cannot afford them)    Recommendations for Other Services   Consider WOC consult for thick, scabbed calluses B lateral feet. Pt stated he'd been seen at wound clinic in past for these but was DCed.     Precautions / Restrictions Precautions Precautions: Fall Precaution Comments: pt denies h/o falls in past 1 year Restrictions Weight Bearing Restrictions: No      Mobility  Bed Mobility               General bed mobility comments: up in recliner  Transfers Overall transfer level: Modified independent Equipment used: None                Ambulation/Gait Ambulation/Gait assistance: Supervision Ambulation Distance (Feet): 100 Feet Assistive device: None Gait Pattern/deviations: Decreased step length - right;Decreased step length - left;Wide base of support;Step-through pattern     General Gait Details: Pt with slow gait with wide BOS, HR 107 walking, SaO2 92% on RA at end of walk, 2/4 dyspnea  Stairs             Wheelchair Mobility    Modified Rankin (Stroke Patients Only)       Balance Overall balance assessment: Independent                                           Pertinent Vitals/Pain Pain Assessment: No/denies pain    Home Living Family/patient expects to be discharged to:: Private residence Living Arrangements: Children Available Help at Discharge: Available PRN/intermittently Type of Home: Apartment Home Access: Stairs to enter Entrance Stairs-Rails: None Entrance Stairs-Number of Steps: 1 Home Layout: One level Home Equipment: Walker - 4 wheels;Grab bars - tub/shower;Cane - single point Additional Comments: HHOT is refurbishing a  tub bench for pt, son just moved in with pt, son works/goes to school    Prior Function Level of Independence: Independent with assistive device(s)   Gait / Transfers Assistance Needed: walks independently in home, can drive, sponge bathes independently, has groceries delivered           Hand Dominance   Dominant Hand: Right    Extremity/Trunk Assessment               Lower Extremity Assessment: Overall WFL for tasks assessed RLE Deficits / Details: pitting edema B feet and cellulitis    Cervical / Trunk Assessment: Normal  Communication   Communication: No difficulties  Cognition Arousal/Alertness: Awake/alert Behavior During Therapy: [facility] for  tasks assessed/performed Overall Cognitive Status: Within Functional Limits for tasks assessed                      General Comments General comments (skin integrity, edema, etc.): scabbed calluses lateral border B feet, pt stated he'd been DCed from wound clinic for these wounds    Exercises General Exercises - Lower Extremity Ankle Circles/Pumps: AROM;Both;10 reps;Seated Long Arc Quad: AROM;Both;10 reps;Seated      Assessment/Plan    PT Assessment Patient needs continued PT services  PT Diagnosis Generalized weakness   PT Problem List Decreased  activity tolerance;Decreased balance;Decreased mobility;Obesity;Decreased skin integrity  PT Treatment Interventions Gait training;Functional mobility training;Therapeutic exercise;Therapeutic activities;Patient/family education   PT Goals (Current goals can be found in the Care Plan section) Acute Rehab PT Goals Patient Stated Goal: to get rid of fluid, to get in better shape, pt likes to read PT Goal Formulation: With patient Time For Goal Achievement: 06/17/16 Potential to Achieve Goals: Good    Frequency Min 3X/week   Barriers to discharge        Co-evaluation               End of Session Equipment Utilized During Treatment: Gait belt Activity Tolerance: Patient tolerated treatment well Patient left: in chair Nurse Communication: Mobility status         Time: [id number] PT Time Calculation (min) (ACUTE ONLY): 27 min   Charges:   PT Evaluation $PT Eval Low Complexity: 1 Procedure PT Treatments $Gait Training: 8-22 mins   PT G Codes:        Philomena Doheny 06/03/2016, 9:46 AM [phone]

## 2016-06-04 ENCOUNTER — Inpatient Hospital Stay (HOSPITAL_COMMUNITY): Payer: Medicare Other

## 2016-06-04 ENCOUNTER — Telehealth: Payer: Self-pay | Admitting: Family Medicine

## 2016-06-04 DIAGNOSIS — R609 Edema, unspecified: Secondary | ICD-10-CM

## 2016-06-04 DIAGNOSIS — A419 Sepsis, unspecified organism: Secondary | ICD-10-CM

## 2016-06-04 LAB — BASIC METABOLIC PANEL
ANION GAP: 14 (ref 5–15)
BUN: 26 mg/dL — ABNORMAL HIGH (ref 6–20)
CALCIUM: 8.3 mg/dL — AB (ref 8.9–10.3)
CHLORIDE: 96 mmol/L — AB (ref 101–111)
CO2: 26 mmol/L (ref 22–32)
CREATININE: 1.42 mg/dL — AB (ref 0.61–1.24)
GFR calc non Af Amer: 53 mL/min — ABNORMAL LOW (ref >60)
Glucose, Bld: 108 mg/dL — ABNORMAL HIGH (ref 65–99)
Potassium: 3.6 mmol/L (ref 3.5–5.1)
SODIUM: 136 mmol/L (ref 135–145)

## 2016-06-04 LAB — CBC
HCT: 42.3 % (ref 39.0–52.0)
HEMOGLOBIN: 13.9 g/dL (ref 13.0–17.0)
MCH: 31.4 pg (ref 26.0–34.0)
MCHC: 32.9 g/dL (ref 30.0–36.0)
MCV: 95.5 fL (ref 78.0–100.0)
PLATELETS: 392 10*3/uL (ref 150–400)
RBC: 4.43 MIL/uL (ref 4.22–5.81)
RDW: 15.9 % — ABNORMAL HIGH (ref 11.5–15.5)
WBC: 13.4 10*3/uL — AB (ref 4.0–10.5)

## 2016-06-04 LAB — GLUCOSE, CAPILLARY
GLUCOSE-CAPILLARY: 135 mg/dL — AB (ref 65–99)
GLUCOSE-CAPILLARY: 79 mg/dL (ref 65–99)
Glucose-Capillary: 109 mg/dL — ABNORMAL HIGH (ref 65–99)
Glucose-Capillary: 98 mg/dL (ref 65–99)

## 2016-06-04 LAB — MRSA PCR SCREENING: MRSA by PCR: POSITIVE — AB

## 2016-06-04 MED ORDER — TORSEMIDE 20 MG PO TABS: 50.0000 mg | ORAL_TABLET | Freq: Two times a day (BID) | ORAL | Status: DC

## 2016-06-04 MED ORDER — CHLORHEXIDINE GLUCONATE CLOTH 2 % EX PADS
6.0000 | MEDICATED_PAD | Freq: Every day | CUTANEOUS | Status: DC
  Administered 2016-06-05: 6 via TOPICAL

## 2016-06-04 MED ORDER — POTASSIUM CHLORIDE CRYS ER 20 MEQ PO TBCR
20.0000 meq | EXTENDED_RELEASE_TABLET | Freq: Two times a day (BID) | ORAL | Status: AC
  Administered 2016-06-04 (×2): 20 meq via ORAL
  Filled 2016-06-04 (×2): qty 1

## 2016-06-04 MED ORDER — MUPIROCIN 2 % EX OINT
1.0000 "application " | TOPICAL_OINTMENT | Freq: Two times a day (BID) | CUTANEOUS | Status: DC
  Administered 2016-06-04 – 2016-06-06 (×5): 1 via NASAL
  Filled 2016-06-04: qty 22

## 2016-06-04 MED ORDER — FUROSEMIDE 10 MG/ML IJ SOLN
40.0000 mg | Freq: Three times a day (TID) | INTRAMUSCULAR | Status: DC
  Administered 2016-06-04 – 2016-06-05 (×6): 40 mg via INTRAVENOUS
  Filled 2016-06-04 (×6): qty 4

## 2016-06-04 NOTE — Progress Notes (Signed)
Family Medicine Teaching Service Daily Progress Note Intern Pager: [phone]  Patient name: Matthew [name] Medical record number: [id number] Date of birth: April 11, 1956 Age: 60 y.o. Gender: male  Primary Care Provider: Junie Panning, DO Consultants: ID Code Status: Full  Pt Overview and Major Events to Date:  9/3: Admitted for sepsis; PICC line removed/cultured. Vanc continued, cefepime started.  Assessment and Plan: NIILO DARBYSHIRE is a 60 y.o. male presenting with rigors currently being treated for MRSA bacteremia . PMH is significant for DMII, morbid obesity, alcoholic cirrhosis, alcohol abuse in remission, HTN, depression, atrial fibrillation, and GERD.   Sepsis/bacteremia: Source PICC line vs continued LLE cellulitis. CXR with small R pleural effusion and bibasilar airspace opacities. No vegetations were seen on TEE.  -WBC 20>13.5>13.4 - S/p sepsis protocol > only given 2 L due to concerns for fluid overload at recent admission - Continue Vanc& cefepime - Consult ID, appreciate recs: Pull PICC, continue abx - Blood/urine/PICC cultures pending - Tylenol PRN for fever:  afebrile - consider renal US or CT abdomen to r/o renal abcess 2/2 MRSA in urine on recent admission although vanc should cover MRSA in urine  Bilateral Leg Edema: Likely multifactorial with alcoholic cirrhosis, venous stasis (reduced ambulation), and venous stasis (pannus compression of the femoral veins). Bilateral pitting edema. Lower extremity erythema diffuse below the knee. - Nursing orders to elevate legs whenever stationary - TED hose at all times. The patient does not tolerate these devices then Ace bandages should be used as a second line therapy. - restart home torsemide  - Will monitor  R pleural effusion: Small, thought ought to be secondary to end stage liver diease. This was drained at previous hospitalization and found to be transudative. Seems to be much smaller than previous now. - continue to  monitor   Alcoholic cirrhosis: Wt [id number] on admission (was 425lb on discharge). Wt 442. Out 3.8L on 9/5.  Abdominal US with cirrhosis and probable steatosis without focal liver lesions noted on 8/20.  Echo EF 65%, mild basal septal LV hypertrophy, indeterminate diastolic function 2/2 afib.  - restart home torsemide - Spironolactone 100 mg daily  - Strict I/Os - Daily weights  DMII: Diet controlled. Cover with SSI. Most recent A1C 6.9 in 05/18/16. Newly rx'd metformin as an outpt on discharge last week. Nutrition consulted for dietary advice last admission. - continue home neurontin - sensitive sliding scale insulin - monitor CBGs  AKI, improving: Cr baseline 1.08, however was 1.51 on discharge. Curious if vancomycin could be contributing, as well as lasix.  - Cr: 1.59>1.47>1.42 - continue to monitor  HTN: currently normotensive, HCTZ was discontinued during last hospitalization. - Spironolactone restarted 06/02/16 - continue to monitor.   Afib: Rate controlled on dilt, will continue. CHADvasc score is 2, not on anticoagulation due to end stage liver failure.   -continue dilt w/ holding parameters SBP<110, DBP<60  OSA: Uses CPAP at home.  -continue CPAP qhs  FEN/GI: Carb modified diet Prophylaxis: Lovenox  Disposition: Pending fluid and infection status.  Subjective:  Pt feels well today, although concerned with increased weight. Pleased with the plan to continue lasix diuresis. Notes that he does not feel as short of breath as when we had to do a thoracentesis a few weeks ago.  Objective: Temp:  [97.7 F (36.5 C)-98.2 F (36.8 C)] 97.7 F (36.5 C) (09/06 0345) Pulse Rate:  [71-80] 80 (09/06 0345) Resp:  [18] 18 (09/06 0345) BP: (110-156)/(62-95) 156/79 (09/06 0345) SpO2:  [93 %-100 %] 93 % (  09/06 0345) Weight:  [442 lb 3.2 oz (200.6 kg)] 442 lb 3.2 oz (200.6 kg) (09/06 0345) Physical Exam: General: NAD, morbidly obese male, sitting up at side of  bed Cardiovascular:Relatively irregularly irregular rhythm, normal rate,  no m/r/g Respiratory: difficult exam given habitus; some possible crackles on R-side; could not r/o atelectasis. Abdomen: large pannus w/ marked lichenification of skin on the inferior aspect Extremities: 3+ LE edema, hyperpigmentation (L>R) and erythema (R>L). Nonpurlent. Bilateral lateral midfoot eschars without erythema, warmth, or drainage   Neuro: grossly intact, follows commands  Laboratory:  Recent Labs Lab 06/02/16 0329 06/03/16 0802 06/04/16 0324  WBC 20.0* 13.5* 13.4*  HGB 14.0 14.5 13.9  HCT 42.9 43.7 42.3  PLT 335 386 392    Recent Labs Lab 06/01/16 0345 06/02/16 0329 06/03/16 0802 06/04/16 0324  NA 134* 136 137 136  K 4.3 4.1 4.9 3.6  CL 96* 99* 99* 96*  CO2 29 29 31 26   BUN 22* 26* 25* 26*  CREATININE 1.47* 1.59* 1.47* 1.42*  CALCIUM 8.4* 8.3* 8.5* 8.3*  PROT 8.1  --   --   --   BILITOT 1.2  --   --   --   ALKPHOS 144*  --   --   --   ALT 23  --   --   --   AST 43*  --   --   --   GLUCOSE 129* 125* 100* 108*    Sela Hilding, MD 06/04/2016, 7:26 AM PGY-1, [place] Intern pager: [phone], text pages welcome

## 2016-06-04 NOTE — Progress Notes (Signed)
Physical Therapy Treatment Patient Details Name: Matthew [name] MRN: [id number] DOB: 04-17-1956 Today's Date: 06/04/2016    History of Present Illness 60 year old male admitted with RLE cellulitis, MRSA, sepsis, R pleural effusion. PMH: morbid obesity, type 2 diabetes, alcohol abuse, alcoholic cirrhosis.     PT Comments    Pt admitted with above diagnosis. Pt currently with functional limitations due to balance and endurance deficits. Pt was able to ambulate down hallway and perform exercises.  Pt did desat to 84% on RA but quickly recovered to 91%.  Continue PT. Pt will benefit from skilled PT to increase their independence and safety with mobility to allow discharge to the venue listed below.    Follow Up Recommendations  Home health PT (if covered. Home environment assessment)     Equipment Recommendations  Other (comment) (pt stated he needs diabetic shoes but cannot afford them)    Recommendations for Other Services Other (comment) (Orthotic c/s, needs prescription for new diabetic shoes)     Precautions / Restrictions Precautions Precautions: Fall Precaution Comments: pt denies h/o falls in past 1 year Restrictions Weight Bearing Restrictions: No    Mobility  Bed Mobility Overal bed mobility: Modified Independent             General bed mobility comments: Takes incr time to get to EOB as pt uses momentum.  Transfers Overall transfer level: Modified independent Equipment used: None             General transfer comment: does not want help to get up.  Asks PT to move  Ambulation/Gait Ambulation/Gait assistance: Supervision Ambulation Distance (Feet): 150 Feet Assistive device: None Gait Pattern/deviations: Step-through pattern;Decreased stride length   Gait velocity interpretation: Below normal speed for age/gender General Gait Details: Pt with slow gait with wide BOS, SaO2 84% on RA at end of walk, 2/4 dyspnea.  Took less than a minute to return to 91%.     Stairs            Wheelchair Mobility    Modified Rankin (Stroke Patients Only)       Balance Overall balance assessment: Independent (Pt does not lose balance with min challenges to balance.)                                  Cognition Arousal/Alertness: Awake/alert Behavior During Therapy: WFL for tasks assessed/performed Overall Cognitive Status: Within Functional Limits for tasks assessed                      Exercises General Exercises - Lower Extremity Ankle Circles/Pumps: AROM;Both;10 reps;Seated Long Arc Quad: AROM;Both;10 reps;Seated Hip ABduction/ADduction: AROM;Both;10 reps;Standing Hip Flexion/Marching: AROM;Both;10 reps;Seated;Standing    General Comments        Pertinent Vitals/Pain Pain Assessment: Faces Faces Pain Scale: Hurts little more Pain Location: blisters on lateral border of feet Pain Descriptors / Indicators: Aching;Discomfort;Grimacing;Guarding Pain Intervention(s): Limited activity within patient's tolerance;Monitored during session;Repositioned    Home Living                      Prior Function            PT Goals (current goals can now be found in the care plan section) Progress towards PT goals: Progressing toward goals    Frequency  Min 3X/week    PT Plan Current plan remains appropriate    Co-evaluation  End of Session Equipment Utilized During Treatment: Gait belt Activity Tolerance: Patient tolerated treatment well Patient left: in chair     Time: 1003-1030 PT Time Calculation (min) (ACUTE ONLY): 27 min  Charges:  $Gait Training: 8-22 mins $Therapeutic Exercise: 8-22 mins                    G Codes:      Matthew [name], Matthew [name] 2016/06/14, 12:50 PM [employer] Acute Rehabilitation [phone] (pager)

## 2016-06-04 NOTE — Progress Notes (Addendum)
Pharmacy Antibiotic Note  Matthew [name] is a 60 y.o. male admitted on 06/01/2016 with fever/chills, recurrent sepsis.  Pharmacy has been consulted for vancomycin (Day #16) and cefepime (Day #3) dosing. TEE negative, PICC removed. Pt was hospitalized 05/17/16-05/28/16 with RLE cellulitis, found to have a MRSA bacteremia, also in urine (d/c'd home to complete vanc 9/4). ID wants to extend treatment for now. Noted w/ positive blood culture for Bacillus species on 8/21, felt c/w contaminate. WBC stable 13.4, afebrile. SCr stable 1.42, normalized CrCl~57.  Per ID note, may consider stopping vancomycin in the AM. Awaiting vascular studies, dopplers.  Plan: Continue vancomycin 1500mg  IV q24h (therapeutic VT on 9/3) Cefepime 2g IV q8h Monitor clinical progress, c/s, renal function, LOT/ID plans VT prn as indicated   Height: 5\' 10"  (177.8 cm) Weight: (!) 442 lb 3.2 oz (200.6 kg) (scale b) IBW/kg (Calculated) : 73  Temp (24hrs), Avg:98 F (36.7 C), Min:97.7 F (36.5 C), Max:98.2 F (36.8 C)   Recent Labs Lab 06/01/16 0345 06/01/16 0413 06/01/16 0704 06/01/16 1000 06/01/16 1545 06/02/16 0329 06/03/16 0802 06/04/16 0324  WBC 23.8*  --   --   --   --  20.0* 13.5* 13.4*  CREATININE 1.47*  --   --   --   --  1.59* 1.47* 1.42*  LATICACIDVEN  --  2.88* 2.2* 2.9*  --   --   --   --   VANCOTROUGH  --   --   --   --  14*  --   --   --     Estimated Creatinine Clearance: 98.2 mL/min (by C-G formula based on SCr of 1.42 mg/dL).    Allergies  Allergen Reactions  . Potassium-Containing Compounds Other (See Comments)    Reaction:  Stomach pain   . Doxycycline Other (See Comments)    Reaction:  Right side pain   . Augmentin [Amoxicillin-Pot Clavulanate] Nausea Only and Other (See Comments)    Has patient had a PCN reaction causing immediate rash, facial/tongue/throat swelling, SOB or lightheadedness with hypotension: No Has patient had a PCN reaction causing severe rash involving mucus  membranes or skin necrosis: No Has patient had a PCN reaction that required hospitalization No Has patient had a PCN reaction occurring within the last 10 years: Yes If all of the above answers are "NO", then may proceed with Cephalosporin use.  [name] Neomycin Rash    Antimicrobials this admission: 8/19 vanc>>  9/3 cefepime>>  Dose adjustments this admission: -9/3: VT = 14 at ~ 4pm (last dose 2:30pm on 9/2). Level drawn 2 hours late -8/25: VT= 23 and changed from 1750mg  IV q24hr to 1500mg  IV q24hr  Microbiology results: 9/3 urine > neg 9/3 blood >ngtd 9/3 cath tip > ngtd 8/19 blood > MRSA 1/2  Elicia Lamp, PharmD, BCPS Clinical Pharmacist 06/04/2016 9:44 AM

## 2016-06-04 NOTE — Progress Notes (Signed)
New ted hose applied to Left lower extremity.  Pt had removed old one as pt stated they were not dirty.  Will continue to monitor.  Karie Kirks, RN "

## 2016-06-04 NOTE — Progress Notes (Signed)
*  Preliminary Results* Right upper extremity venous duplex completed. Study was technically difficult due to patient body habitus. Right upper extremity is negative for deep and superficial vein thrombosis.  Right lower extremity venous duplex completed. Study was technically difficult and limited due to patient body habitus and large pannus. Visualized veins of the right lower extremity are negative for deep vein thrombosis. There is no evidence of right Baker's cyst.  In consideration of the ABI order, the right distal posterior tibial artery was evaluated and found to be patent with triphasic flow. Per Dr. Andria Frames, this is sufficient and the ABI order will be canceled.  06/04/2016 11:49 AM  Maudry Mayhew, BS, RVT, RDCS, RDMS

## 2016-06-04 NOTE — Telephone Encounter (Signed)
Pt called to cancel appointment and let you know he has been admitted to the hospital. ep

## 2016-06-04 NOTE — Progress Notes (Signed)
I made social visit today to [facility].  I appreciate care of team and ID consultants.

## 2016-06-04 NOTE — Telephone Encounter (Signed)
Pt is calling from the hospital. He said that his dosage for Lasix is wrong. He needs this straighten out right away. Please call or come see him in the hospital. jw

## 2016-06-04 NOTE — Progress Notes (Signed)
INFECTIOUS DISEASE PROGRESS NOTE  ID: Matthew [name] is a 60 y.o. male with  Active Problems:   Sepsis ([place])   Chronic venous insufficiency  Subjective: Resting quietly.   Abtx:  Anti-infectives    Start     Dose/Rate Route Frequency Ordered Stop   06/01/16 1800  vancomycin (VANCOCIN) 1,500 mg in sodium chloride 0.9 % 500 mL IVPB     1,500 mg 250 mL/hr over 120 Minutes Intravenous Every 24 hours 06/01/16 1706     06/01/16 0615  ceFEPIme (MAXIPIME) 2 g in dextrose 5 % 50 mL IVPB     2 g 100 mL/hr over 30 Minutes Intravenous Every 8 hours 06/01/16 0605        Medications:  Scheduled: . aspirin EC  81 mg Oral Daily  . ceFEPime (MAXIPIME) IV  2 g Intravenous Q8H  . diltiazem  180 mg Oral Daily  . enoxaparin (LOVENOX) injection  100 mg Subcutaneous Q24H  . furosemide  40 mg Intravenous TID  . gabapentin  300 mg Oral TID  . insulin aspart  0-9 Units Subcutaneous TID WC  . nystatin   Topical TID  . potassium chloride  20 mEq Oral BID  . sodium chloride flush  3 mL Intravenous Q12H  . spironolactone  100 mg Oral Daily  . vancomycin  1,500 mg Intravenous Q24H    Objective: Vital signs in last 24 hours: Temp:  [97.7 F (36.5 C)-98.2 F (36.8 C)] 97.7 F (36.5 C) (09/06 0345) Pulse Rate:  [71-80] 80 (09/06 0345) Resp:  [18] 18 (09/06 0345) BP: (110-156)/(62-95) 156/79 (09/06 0345) SpO2:  [93 %-100 %] 93 % (09/06 0345) Weight:  [200.6 kg (442 lb 3.2 oz)] 200.6 kg (442 lb 3.2 oz) (09/06 0345)   General appearance: no distress Resp: clear to auscultation bilaterally Cardio: regular rate and rhythm GI: normal findings: bowel sounds normal and soft, non-tender Extremities: RLE erythema slightly worse, continued heat.   Lab Results  Recent Labs  06/03/16 0802 06/04/16 0324  WBC 13.5* 13.4*  HGB 14.5 13.9  HCT 43.7 42.3  NA 137 136  K 4.9 3.6  CL 99* 96*  CO2 31 26  BUN 25* 26*  CREATININE 1.47* 1.42*   Liver Panel No results for input(s): PROT, ALBUMIN,  AST, ALT, ALKPHOS, BILITOT, BILIDIR, IBILI in the last 72 hours. Sedimentation Rate No results for input(s): ESRSEDRATE in the last 72 hours. C-Reactive Protein No results for input(s): CRP in the last 72 hours.  Microbiology: Recent Results (from the past 240 hour(s))  Culture, blood (Routine x 2)     Status: None (Preliminary result)   Collection Time: 06/01/16  4:00 AM  Result Value Ref Range Status   Specimen Description BLOOD RIGHT WRIST  Final   Special Requests BOTTLES DRAWN AEROBIC AND ANAEROBIC 5CC EA  Final   Culture NO GROWTH 2 DAYS  Final   Report Status PENDING  Incomplete  Culture, blood (Routine x 2)     Status: None (Preliminary result)   Collection Time: 06/01/16  4:09 AM  Result Value Ref Range Status   Specimen Description BLOOD RIGHT HAND  Final   Special Requests BOTTLES DRAWN AEROBIC ONLY 5CC  Final   Culture NO GROWTH 2 DAYS  Final   Report Status PENDING  Incomplete  Urine culture     Status: None   Collection Time: 06/01/16  4:41 AM  Result Value Ref Range Status   Specimen Description URINE, CATHETERIZED  Final   Special  Requests NONE  Final   Culture NO GROWTH  Final   Report Status 06/02/2016 FINAL  Final  Cath Tip Culture     Status: None (Preliminary result)   Collection Time: 06/01/16  4:00 PM  Result Value Ref Range Status   Specimen Description CATH TIP  Final   Special Requests NONE  Final   Culture NO GROWTH 3 DAYS  Final   Report Status PENDING  Incomplete    Studies/Results: No results found.   Assessment/Plan: Prev MRSA bacteremia Cellulitis Sepsis  His WBC is stable, he is afebrile.  Await his dopplers, vascular studies.  Consider stop vanco in am?  Total days of antibiotics: 16 vanco, 3 cefepime         Bobby Rumpf Infectious Diseases (pager) [phone] [email or web address] 06/04/2016, 9:44 AM  LOS: 3 days

## 2016-06-04 NOTE — Progress Notes (Signed)
RT put sterile water in CPAP water chamber. Patient states he places himself on and off of CPAP when ready. RT informed patient if he has any trouble have RN contact RT.

## 2016-06-05 ENCOUNTER — Ambulatory Visit: Payer: Self-pay | Admitting: Family Medicine

## 2016-06-05 LAB — CBC
HEMATOCRIT: 39.2 % (ref 39.0–52.0)
HEMOGLOBIN: 13 g/dL (ref 13.0–17.0)
MCH: 31.7 pg (ref 26.0–34.0)
MCHC: 33.2 g/dL (ref 30.0–36.0)
MCV: 95.6 fL (ref 78.0–100.0)
Platelets: 404 10*3/uL — ABNORMAL HIGH (ref 150–400)
RBC: 4.1 MIL/uL — ABNORMAL LOW (ref 4.22–5.81)
RDW: 16 % — ABNORMAL HIGH (ref 11.5–15.5)
WBC: 11.4 10*3/uL — ABNORMAL HIGH (ref 4.0–10.5)

## 2016-06-05 LAB — CATH TIP CULTURE: Culture: NO GROWTH

## 2016-06-05 LAB — BASIC METABOLIC PANEL
Anion gap: 8 (ref 5–15)
BUN: 23 mg/dL — AB (ref 6–20)
CHLORIDE: 100 mmol/L — AB (ref 101–111)
CO2: 29 mmol/L (ref 22–32)
CREATININE: 1.31 mg/dL — AB (ref 0.61–1.24)
Calcium: 8.6 mg/dL — ABNORMAL LOW (ref 8.9–10.3)
GFR calc Af Amer: >60 mL/min (ref >60)
GFR calc non Af Amer: 58 mL/min — ABNORMAL LOW (ref >60)
Glucose, Bld: 95 mg/dL (ref 65–99)
Potassium: 3.7 mmol/L (ref 3.5–5.1)
Sodium: 137 mmol/L (ref 135–145)

## 2016-06-05 LAB — GLUCOSE, CAPILLARY
GLUCOSE-CAPILLARY: 99 mg/dL (ref 65–99)
GLUCOSE-CAPILLARY: 99 mg/dL (ref 65–99)
Glucose-Capillary: 124 mg/dL — ABNORMAL HIGH (ref 65–99)
Glucose-Capillary: 134 mg/dL — ABNORMAL HIGH (ref 65–99)

## 2016-06-05 MED ORDER — CEPHALEXIN 500 MG PO CAPS
500.0000 mg | ORAL_CAPSULE | Freq: Two times a day (BID) | ORAL | Status: DC
  Administered 2016-06-05 – 2016-06-06 (×3): 500 mg via ORAL
  Filled 2016-06-05 (×3): qty 1

## 2016-06-05 NOTE — Progress Notes (Signed)
RT filled CPAP with sterile water and patient stated he would place CPAP on himself when he goes to bed. RT informed patient to call if he needs assistance. RT will monitor as needed.

## 2016-06-05 NOTE — Progress Notes (Signed)
Patient is experiencing increased sleepiness stated that he has been napping throughout the day. Has tremors/jerking that has been ongoing on for years per patient. sitting up in chair vitals are stable. Would like to have lasix given early in the day. To get proper rest at night.

## 2016-06-05 NOTE — Progress Notes (Signed)
INFECTIOUS DISEASE PROGRESS NOTE  ID: Matthew [name] is a 60 y.o. male with  Active Problems:   Sepsis ([place])   Chronic venous insufficiency  Subjective: Without complaints  Abtx:  Anti-infectives    Start     Dose/Rate Route Frequency Ordered Stop   06/01/16 1800  vancomycin (VANCOCIN) 1,500 mg in sodium chloride 0.9 % 500 mL IVPB  Status:  Discontinued     1,500 mg 250 mL/hr over 120 Minutes Intravenous Every 24 hours 06/01/16 1706 06/05/16 0926   06/01/16 0615  ceFEPIme (MAXIPIME) 2 g in dextrose 5 % 50 mL IVPB     2 g 100 mL/hr over 30 Minutes Intravenous Every 8 hours 06/01/16 0605        Medications:  Scheduled: . aspirin EC  81 mg Oral Daily  . ceFEPime (MAXIPIME) IV  2 g Intravenous Q8H  . Chlorhexidine Gluconate Cloth  6 each Topical Q0600  . diltiazem  180 mg Oral Daily  . enoxaparin (LOVENOX) injection  100 mg Subcutaneous Q24H  . furosemide  40 mg Intravenous TID  . gabapentin  300 mg Oral TID  . insulin aspart  0-9 Units Subcutaneous TID WC  . mupirocin ointment  1 application Nasal BID  . nystatin   Topical TID  . sodium chloride flush  3 mL Intravenous Q12H  . spironolactone  100 mg Oral Daily    Objective: Vital signs in last 24 hours: Temp:  [97.3 F (36.3 C)-97.7 F (36.5 C)] 97.7 F (36.5 C) (09/07 0441) Pulse Rate:  [75-81] 75 (09/07 0441) Resp:  [16-18] 16 (09/07 0441) BP: (114-133)/(52-72) 114/52 (09/07 0441) SpO2:  [97 %] 97 % (09/07 0441) Weight:  [198.3 kg (437 lb 3.2 oz)] 198.3 kg (437 lb 3.2 oz) (09/07 0441)   General appearance: alert, cooperative and morbidly obese Extremities: RLE is non-tender. erythema slightly better. edema unchanged.   Lab Results  Recent Labs  06/04/16 0324 06/05/16 0247  WBC 13.4* 11.4*  HGB 13.9 13.0  HCT 42.3 39.2  NA 136 137  K 3.6 3.7  CL 96* 100*  CO2 26 29  BUN 26* 23*  CREATININE 1.42* 1.31*   Liver Panel No results for input(s): PROT, ALBUMIN, AST, ALT, ALKPHOS, BILITOT, BILIDIR,  IBILI in the last 72 hours. Sedimentation Rate No results for input(s): ESRSEDRATE in the last 72 hours. C-Reactive Protein No results for input(s): CRP in the last 72 hours.  Microbiology: Recent Results (from the past 240 hour(s))  Culture, blood (Routine x 2)     Status: None (Preliminary result)   Collection Time: 06/01/16  4:00 AM  Result Value Ref Range Status   Specimen Description BLOOD RIGHT WRIST  Final   Special Requests BOTTLES DRAWN AEROBIC AND ANAEROBIC 5CC EA  Final   Culture NO GROWTH 3 DAYS  Final   Report Status PENDING  Incomplete  Culture, blood (Routine x 2)     Status: None (Preliminary result)   Collection Time: 06/01/16  4:09 AM  Result Value Ref Range Status   Specimen Description BLOOD RIGHT HAND  Final   Special Requests BOTTLES DRAWN AEROBIC ONLY 5CC  Final   Culture NO GROWTH 3 DAYS  Final   Report Status PENDING  Incomplete  Urine culture     Status: None   Collection Time: 06/01/16  4:41 AM  Result Value Ref Range Status   Specimen Description URINE, CATHETERIZED  Final   Special Requests NONE  Final   Culture NO GROWTH  Final   Report Status 06/02/2016 FINAL  Final  Cath Tip Culture     Status: None   Collection Time: 06/01/16  4:00 PM  Result Value Ref Range Status   Specimen Description CATH TIP  Final   Special Requests NONE  Final   Culture NO GROWTH 3 DAYS  Final   Report Status 06/05/2016 FINAL  Final  MRSA PCR Screening     Status: Abnormal   Collection Time: 06/04/16  7:56 AM  Result Value Ref Range Status   MRSA by PCR POSITIVE (A) NEGATIVE Final    Comment:        The GeneXpert MRSA Assay (FDA approved for NASAL specimens only), is one component of a comprehensive MRSA colonization surveillance program. It is not intended to diagnose MRSA infection nor to guide or monitor treatment for MRSA infections. RESULT CALLED TO, READ BACK BY AND VERIFIED WITH: N. BUCK RN 11:20 06/04/16 ([email or web address])     Studies/Results: No results  found.   Assessment/Plan: Prev MRSA bacteremia Cellulitis Sepsis  Total days of antibiotics: 4 cefepime   He has improved- wbc near normal, afebrile.    No DVT RUE or RLE. ABI RLE normal.  vanco off Will change his cefepime to keflex. 4 more days.  Available as needed.      Bobby Rumpf Infectious Diseases (pager) [phone] [email or web address] 06/05/2016, 9:36 AM  LOS: 4 days

## 2016-06-05 NOTE — Progress Notes (Signed)
Family Medicine Teaching Service Daily Progress Note Intern Pager: [phone]  Patient name: Matthew [name] Medical record number: [id number] Date of birth: 20-Oct-1955 Age: 60 y.o. Gender: male  Primary Care Provider: Junie Panning, DO Consultants: ID Code Status: Full  Pt Overview and Major Events to Date:  9/3: Admitted for sepsis; PICC line removed/cultured. Vanc continued, cefepime started. 9/7: D/c'd vanc (total of 16 days of treatment)  Assessment and Plan: Matthew [name] is a 60 y.o. male presenting with rigors currently being treated for MRSA bacteremia . PMH is significant for DMII, morbid obesity, alcoholic cirrhosis, alcohol abuse in remission, HTN, depression, atrial fibrillation, and GERD.   Sepsis/bacteremia: Source PICC line vs continued LLE cellulitis. CXR with small R pleural effusion and bibasilar airspace opacities. No vegetations were seen on TEE.  -WBC 20>13.5>13.4>11.4 - Continue cefepime, discontinue vanc today - Consult ID, appreciate recs: d/c vanc today  - Blood/urine/PICC cultures NGTD - Tylenol PRN for fever:  afebrile  Bilateral Leg Edema: Likely multifactorial with alcoholic cirrhosis, venous stasis (reduced ambulation), and venous stasis (pannus compression of the femoral veins). Bilateral pitting edema. Lower extremity erythema diffuse below the knee. - Nursing orders to elevate legs whenever stationary - TED hose at all times. The patient does not tolerate these devices then Ace bandages should be used as a second line therapy. - home torsemide  - Will monitor  R pleural effusion: Small, thought ought to be secondary to end stage liver diease. This was drained at previous hospitalization and found to be transudative.  - continue to monitor   Alcoholic cirrhosis: Wt 123XX123 on admission (was 425lb on discharge). Wt 437. Out 1.9L on 9/6.  Abdominal US with cirrhosis and probable steatosis without focal liver lesions noted on 8/20.  Echo EF 65%,  mild basal septal LV hypertrophy, indeterminate diastolic function 2/2 afib.  - home torsemide - Spironolactone 100 mg daily  - Strict I/Os - Daily weights  DMII: Diet controlled. Cover with SSI. Most recent A1C 6.9 in 05/18/16. Newly rx'd metformin as an outpt on discharge last week. Nutrition consulted for dietary advice last admission. - continue home neurontin - sensitive sliding scale insulin - monitor CBGs  AKI, improving: Cr baseline 1.08, however was 1.51 on discharge. Curious if vancomycin could be contributing, as well as lasix.  - Cr: 1.59>1.47>1.42>1.31 - continue to monitor  HTN: currently normotensive, HCTZ was discontinued during last hospitalization. - Spironolactone restarted 06/02/16 - continue to monitor.   Afib: Rate controlled on dilt, will continue. CHADvasc score is 2, not on anticoagulation due to end stage liver failure.   -continue dilt w/ holding parameters SBP<110, DBP<60  OSA: Uses CPAP at home.  -continue CPAP qhs  FEN/GI: Carb modified diet Prophylaxis: Lovenox  Disposition: Pending fluid and infection status.  Subjective:  Pt feels well today. He is concerned about the timing of his lasix yesterday, but is pleased by his WBC and Cr improvement. He reports that when he has urinary urgency after lasix he sometimes misses the toilet and this is why his ted hose were dirtied. He also reports that no one has found him pillows to elevate his legs, but I have since spoken the nurse and tech about this.   Objective: Temp:  [97.3 F (36.3 C)-97.7 F (36.5 C)] 97.7 F (36.5 C) (09/07 0441) Pulse Rate:  [75-81] 75 (09/07 0441) Resp:  [16-18] 16 (09/07 0441) BP: (114-133)/(52-72) 114/52 (09/07 0441) SpO2:  [97 %] 97 % (09/07 0441) Weight:  [437 lb 3.2  oz (198.3 kg)] 437 lb 3.2 oz (198.3 kg) (09/07 0441) Physical Exam: General: NAD, morbidly obese male, sitting up at side of bed Cardiovascular:Relatively irregularly irregular rhythm, normal rate,   no m/r/g Respiratory: difficult exam given habitus; some possible crackles on R-side; could not r/o atelectasis. Abdomen: large pannus w/ marked lichenification of skin on the inferior aspect Extremities: 3+ LE edema, hyperpigmentation (L>R) and erythema (R>L). Nonpurlent. Bilateral lateral midfoot eschars without erythema, warmth, or drainage   Neuro: grossly intact, follows commands  Laboratory:  Recent Labs Lab 06/03/16 0802 06/04/16 0324 06/05/16 0247  WBC 13.5* 13.4* 11.4*  HGB 14.5 13.9 13.0  HCT 43.7 42.3 39.2  PLT 386 392 404*    Recent Labs Lab 06/01/16 0345  06/03/16 0802 06/04/16 0324 06/05/16 0247  NA 134*  < > 137 136 137  K 4.3  < > 4.9 3.6 3.7  CL 96*  < > 99* 96* 100*  CO2 29  < > 31 26 29   BUN 22*  < > 25* 26* 23*  CREATININE 1.47*  < > 1.47* 1.42* 1.31*  CALCIUM 8.4*  < > 8.5* 8.3* 8.6*  PROT 8.1  --   --   --   --   BILITOT 1.2  --   --   --   --   ALKPHOS 144*  --   --   --   --   ALT 23  --   --   --   --   AST 43*  --   --   --   --   GLUCOSE 129*  < > 100* 108* 95  < > = values in this interval not displayed.  Sela Hilding, MD 06/05/2016, 6:56 AM PGY-1, [place] Intern pager: [phone], text pages welcome

## 2016-06-06 LAB — GLUCOSE, CAPILLARY
GLUCOSE-CAPILLARY: 97 mg/dL (ref 65–99)
GLUCOSE-CAPILLARY: 117 mg/dL — AB (ref 65–99)
Glucose-Capillary: 112 mg/dL — ABNORMAL HIGH (ref 65–99)

## 2016-06-06 LAB — CULTURE, BLOOD (ROUTINE X 2)
Culture: NO GROWTH
Culture: NO GROWTH

## 2016-06-06 MED ORDER — TORSEMIDE 20 MG PO TABS
50.0000 mg | ORAL_TABLET | Freq: Two times a day (BID) | ORAL | Status: DC
  Administered 2016-06-06: 50 mg via ORAL
  Filled 2016-06-06: qty 3

## 2016-06-06 MED ORDER — CEPHALEXIN 500 MG PO CAPS
500.0000 mg | ORAL_CAPSULE | Freq: Two times a day (BID) | ORAL | 0 refills | Status: AC
Start: 2016-06-06 — End: 2016-06-10

## 2016-06-06 MED ORDER — SPIRONOLACTONE 100 MG PO TABS: 50.0000 mg | ORAL_TABLET | Freq: Two times a day (BID) | ORAL | 12 refills | Status: DC

## 2016-06-06 MED ORDER — NYSTATIN 100000 UNIT/GM EX POWD: Freq: Three times a day (TID) | CUTANEOUS | 0 refills | Status: DC

## 2016-06-06 MED FILL — CEPHALEXIN 500 MG CAPSULE: 500 | 3 days supply | Qty: 7 | Fill #0

## 2016-06-06 MED FILL — NYSTOP 100,000 UNITS/GM PWD: 100000 | 10 days supply | Qty: 15 | Fill #0

## 2016-06-06 MED FILL — spIRONOLACTONE 100 MG TAB: 100 | 34 days supply | Qty: 34 | Fill #0

## 2016-06-06 NOTE — Progress Notes (Signed)
Family Medicine Teaching Service Daily Progress Note Intern Pager: [phone]  Patient name: Matthew [name] Medical record number: [id number] Date of birth: 07-27-1956 Age: 60 y.o. Gender: male  Primary Care Provider: Junie Panning, DO Consultants: ID Code Status: Full  Pt Overview and Major Events to Date:  9/3: Admitted for sepsis; PICC line removed/cultured. Vanc continued, cefepime started. 9/7: D/c'd vanc (total of 16 days of treatment), changed to keflex 9/8: changed to home torsemide, will d/c with keflex end date 9/11  Assessment and Plan: Matthew [name] is a 60 y.o. male presenting with rigors currently being treated for MRSA bacteremia . PMH is significant for DMII, morbid obesity, alcoholic cirrhosis, alcohol abuse in remission, HTN, depression, atrial fibrillation, and GERD.   Sepsis/bacteremia: Source PICC line vs continued LLE cellulitis. CXR with small R pleural effusion and bibasilar airspace opacities. No vegetations were seen on TEE. WBC normalizing. All cultures NGTD, PICC culture final. - continue keflex for total of 5 days, end date 9/11 - Tylenol PRN for fever:  afebrile  Bilateral Leg Edema: Likely multifactorial with alcoholic cirrhosis, venous stasis (reduced ambulation), and venous stasis (pannus compression of the femoral veins). Bilateral pitting edema. Lower extremity erythema diffuse below the knee. - Nursing orders to elevate legs whenever stationary - TED hose  - home torsemide   R pleural effusion: Small, thought ought to be secondary to end stage liver diease. This was drained at previous hospitalization and found to be transudative.  - continue to monitor   Alcoholic cirrhosis: Wt 123XX123 on admission (was 425lb on discharge). Wt 437. Out 1.9L on 9/6.  Abdominal US with cirrhosis and probable steatosis without focal liver lesions noted on 8/20.  Echo EF 65%, mild basal septal LV hypertrophy, indeterminate diastolic function 2/2 afib.  - home  torsemide - Spironolactone 100 mg daily  - Strict I/Os - Daily weights  DMII: Diet controlled. Cover with SSI. Most recent A1C 6.9 in 05/18/16. Newly rx'd metformin as an outpt on discharge last week. Nutrition consulted for dietary advice last admission. - continue home neurontin - sensitive sliding scale insulin - monitor CBGs  AKI, improving: Cr baseline 1.08, however was 1.51 on discharge. Curious if vancomycin could be contributing, as well as lasix. Cr downtrending, no need to check today.  - continue to monitor  HTN: currently normotensive, HCTZ was discontinued during last hospitalization. - Spironolactone restarted 06/02/16  Afib: Rate controlled on dilt, will continue. CHADvasc score is 2, not on anticoagulation due to end stage liver failure.   -continue dilt w/ holding parameters SBP<110, DBP<60  OSA: Uses CPAP at home.  -continue CPAP qhs  FEN/GI: Carb modified diet Prophylaxis: Lovenox  Disposition: home today  Subjective:  Pt unhappy with the plan to probably discharge today. Feels we will be kicking him out early. Worried about having chills last night, reassured that his cultures are negative.  Objective: Temp:  [97.7 F (36.5 C)-97.9 F (36.6 C)] 97.8 F (36.6 C) (09/08 0735) Pulse Rate:  [63-97] 63 (09/08 0735) Resp:  [18-22] 22 (09/08 0453) BP: (114-154)/(59-74) 114/59 (09/08 0735) SpO2:  [94 %-97 %] 97 % (09/08 0735) Physical Exam: General: NAD, morbidly obese male, sitting up at side of bed Cardiovascular:Relatively irregularly irregular rhythm, normal rate,  no m/r/g Respiratory: difficult exam given habitus; CTAB Abdomen: large pannus w/ marked lichenification of skin on the inferior aspect Extremities: 3+ LE edema, hyperpigmentation (L>R) and erythema (R>L). Nonpurlent. Neuro: grossly intact, follows commands  Laboratory:  Recent Labs Lab 06/03/16 0802  06/04/16 0324 06/05/16 0247  WBC 13.5* 13.4* 11.4*  HGB 14.5 13.9 13.0  HCT 43.7  42.3 39.2  PLT 386 392 404*    Recent Labs Lab 06/01/16 0345  06/03/16 0802 06/04/16 0324 06/05/16 0247  NA 134*  < > 137 136 137  K 4.3  < > 4.9 3.6 3.7  CL 96*  < > 99* 96* 100*  CO2 29  < > 31 26 29   BUN 22*  < > 25* 26* 23*  CREATININE 1.47*  < > 1.47* 1.42* 1.31*  CALCIUM 8.4*  < > 8.5* 8.3* 8.6*  PROT 8.1  --   --   --   --   BILITOT 1.2  --   --   --   --   ALKPHOS 144*  --   --   --   --   ALT 23  --   --   --   --   AST 43*  --   --   --   --   GLUCOSE 129*  < > 100* 108* 95  < > = values in this interval not displayed.  Sela Hilding, MD 06/06/2016, 8:14 AM PGY-1, [place] Intern pager: [phone], text pages welcome

## 2016-06-06 NOTE — Care Management Important Message (Signed)
Important Message  Patient Details  Name: Matthew [name] MRN: [id number] Date of Birth: 1956-03-02   Medicare Important Message Given:  Yes    Erenest Rasher, RN 06/06/2016, 3:29 PM

## 2016-06-06 NOTE — Care Management Note (Signed)
Case Management Note  Patient Details  Name: Matthew [name] MRN: [id number] Date of Birth: September 21, 1956  Subjective/Objective:         Sepsis, bliateral leg edema, Right Pleural Effusion           Action/Plan: Discharge Planning: AVS reviewed:  NCM spoke to pt at bedside. States he came to hospital by ambulance. He does not have his wallet or ride home. He does not have insurance to cover his medications. Explained [employer] program and once per year. Pt did not have $3 copay. NCM did pick up meds from [facility]. Meds given to pt for home. [facility] contacted for [facility] PT/aide for home. Please see previous NCM notes.    PCP- Lorna Few MD   Expected Discharge Date:  06/06/2016               Expected Discharge Plan:  [place]  In-House Referral:  Clinical Social Work  Discharge planning Services  CM Consult, Medication Assistance  Post Acute Care Choice:  Home Health Choice offered to:  Patient  DME Arranged:  N/A DME Agency:  NA  HH Arranged:  PT, Nurse's Aide [place] Agency:  [facility]  Status of Service:  Completed, signed off  If discussed at [place] of Stay Meetings, dates discussed:  06/05/2016  Additional Comments:  Erenest Rasher, RN 06/06/2016, 4:51 PM

## 2016-06-06 NOTE — Progress Notes (Signed)
I cosign Matthew [name]'s assessment, I's & O's, med administration, and notes. 

## 2016-06-06 NOTE — Progress Notes (Signed)
Taking C-pap on and off within the last hour.

## 2016-06-06 NOTE — Clinical Social Work Note (Signed)
Per RNCM, patient in need of cab voucher to his home address. Voucher filled out and given to RN to call when ready.  CSW signing off. Consult again if any social work needs arise.  Matthew [name], [place]

## 2016-06-06 NOTE — Progress Notes (Signed)
Patient removed Cpap aroused and was asked to put back on while asleep. Patient was acceptable.

## 2016-06-06 NOTE — Discharge Summary (Signed)
[facility] Discharge Summary  Patient name: Matthew [name] Medical record number: [id number] Date of birth: 07-15-1956 Age: 60 y.o. Gender: male Date of Admission: 06/01/2016  Date of Discharge: 06/06/16 Admitting Physician: Lupita Dawn, MD  Primary Care Provider: Junie Panning, DO Consultants: ID  Indication for Hospitalization: sepsis  Discharge Diagnoses/Problem List:  MRSA bactermia, resolved Cellulitis, resolving ESLD Bilateral lower extremity edema R pleural effusion DMII AKI, resolving H/o HTN, normotensive during entire admission Afib OSA  Disposition: home  Discharge Condition: stable  Discharge Exam:  General: NAD, morbidly obese male, sitting up at side of bed Cardiovascular:Relatively irregularly irregular rhythm, normal rate, no m/r/g Respiratory: difficult exam given habitus; CTAB Abdomen: large pannus w/ marked lichenification of skin on the inferior aspect Extremities: 3+ LE edema, hyperpigmentation (L>R) and erythema (R>L). Nonpurlent. Neuro: grossly intact, follows commands  Brief Hospital Course:  Pt discharged on 05/28/16 with PICC line in place for IV vanc 2/2 RLE cellulitis and MRSA bacteremia. Pt felt chills at home on 9/3, febrile in ED to 102.1, tachycardic, lactic acid 2.88. Restarted cefepime, as this was used when he fevered while admitted before. Pt remained afebrile, lactate improved with hydration (2L vs normal 30cc/kg bolus 2/2 concerns for fluild overload in recent admission). CXR without signs of worsening R pleural effusion, no increased SOB. Afebrile on cefepime x4 days, switched to keflex on 9/7. Vanc stopped 9/7. Minimal change in LE edema with diuresis. Diuresis titrated to weight, as patient notes that with diuresis he in unable to get all of his urine in the urinal. Patient afebrile while on keflex x 1 day, will discharge with 5 total days of keflex.     Issues for Follow Up:  1. Keflex end date  06/09/16 2. Continue to monitor fluid status. Restarted home torsemide and continued home spiro. Could add back HCTZ if hypertensive.  3. Continue to counsel on dietary changes and fluid restriction. 4. Please order a referral to surgery for consult for abdominoplasty. This would greatly improve the patient's quality of life and he is amenable to this consult.  5. Reports a history of chills at home, several times prior to becoming bacteremic, would consider outpatient thyroid workup as last done in 2015 (TSH 0.9).   Significant Procedures: PICC line removed 9/4.   Significant Labs and Imaging:   Recent Labs Lab 06/03/16 0802 06/04/16 0324 06/05/16 0247  WBC 13.5* 13.4* 11.4*  HGB 14.5 13.9 13.0  HCT 43.7 42.3 39.2  PLT 386 392 404*    Recent Labs Lab 06/01/16 0345 06/02/16 0329 06/03/16 0802 06/04/16 0324 06/05/16 0247  NA 134* 136 137 136 137  K 4.3 4.1 4.9 3.6 3.7  CL 96* 99* 99* 96* 100*  CO2 29 29 31 26 29   GLUCOSE 129* 125* 100* 108* 95  BUN 22* 26* 25* 26* 23*  CREATININE 1.47* 1.59* 1.47* 1.42* 1.31*  CALCIUM 8.4* 8.3* 8.5* 8.3* 8.6*  ALKPHOS 144*  --   --   --   --   AST 43*  --   --   --   --   ALT 23  --   --   --   --   ALBUMIN 2.2*  --   --   --   --    Blood cultures NG x 4 days.  Urine culture no growth, final.  PICC tip culture no growth, final.   Results/Tests Pending at Time of Discharge: Final blood cultures pending, NG x 4 days.  Discharge Medications:    Medication List    STOP taking these medications   vancomycin 1,500 mg in sodium chloride 0.9 % 500 mL     TAKE these medications   aspirin 81 MG EC tablet Take 1 tablet (81 mg total) by mouth daily.   cephALEXin 500 MG capsule Commonly known as:  KEFLEX Take 1 capsule (500 mg total) by mouth 2 (two) times daily.   diltiazem 180 MG 24 hr capsule Commonly known as:  CARDIZEM CD Take 180 mg by mouth daily.   gabapentin 100 MG capsule Commonly known as:  NEURONTIN Take 300 mg by  mouth 3 (three) times daily.   metFORMIN 1000 MG (MOD) 24 hr tablet Commonly known as:  GLUMETZA Take 1 tablet (1,000 mg total) by mouth daily with breakfast.   nystatin powder Commonly known as:  MYCOSTATIN/NYSTOP Apply topically 3 (three) times daily.   spironolactone 100 MG tablet Commonly known as:  ALDACTONE Take 0.5 tablets (50 mg total) by mouth 2 (two) times daily. What changed:  how much to take  when to take this   torsemide 100 MG tablet Commonly known as:  DEMADEX Take 50 mg by mouth 2 (two) times daily.       Discharge Instructions: Please refer to Patient Instructions section of EMR for full details.  Patient was counseled important signs and symptoms that should prompt return to medical care, changes in medications, dietary instructions, activity restrictions, and follow up appointments.   Follow-Up Appointments: Follow-up [place], DO Follow up on 06/06/2016.   Specialty:  Family Medicine Why:  @215  for Hospital follow up  Contact information: [address] [place] [address] [place] .   Why:  Home Health Physical Therapy and aide Contact information: [address] [place] [address] [phone]           Sela Hilding, MD 06/07/2016, 6:06 PM PGY-1, [place]

## 2016-06-06 NOTE — Progress Notes (Signed)
Pt has orders to be discharged. Discharge instructions given and pt has no additional questions at this time. Medication regimen reviewed and pt educated. Pt verbalized understanding and has no additional questions. Telemetry box removed. IV removed and site in good condition. Pt stable and waiting for transportation.  Takeia Ciaravino RN 

## 2016-06-09 ENCOUNTER — Telehealth: Payer: Self-pay | Admitting: *Deleted

## 2016-06-09 ENCOUNTER — Telehealth: Payer: Self-pay | Admitting: Family Medicine

## 2016-06-09 NOTE — Telephone Encounter (Signed)
Left message on phone authorizing requested orders.

## 2016-06-09 NOTE — Telephone Encounter (Signed)
Called and discussed.  He will enroll with [facility].

## 2016-06-09 NOTE — Telephone Encounter (Signed)
Contacted pt about transportation issue and he stated that he usually drives but due to all the fluid he can't and wanted to see about getting some help or a resource to get him here.  He has an appointment on 9/20 with Dr. Andria Frames but feels like he may need to be seen before then, he said that Dr. Andria Frames was supposed to call him so he would wait to see what Dr. Andria Frames says.  Also there is another phone note that talks about getting a social eval for the pt, which may help with the transportation issue.  Forwarding to PCP and Dr. Andria Frames. Katharina Caper, April D, [place]

## 2016-06-09 NOTE — Telephone Encounter (Signed)
Herbert Deaner, physical therapist with [facility], calling for verbal orders for for patient since being discharged from the hospital. Needs orders for home health aid 1x/week for 2 weeks. Also requesting orders for OT, social work, and nursing evaluations. Clair Gulling can be reached at [phone]

## 2016-06-09 NOTE — Telephone Encounter (Signed)
p is calling and would like to speak to Dr. Andria Frames. He was just released from the hospital and needs to be seen but he is having issues with transportation. Please call to discuss. jw

## 2016-06-12 ENCOUNTER — Ambulatory Visit (INDEPENDENT_AMBULATORY_CARE_PROVIDER_SITE_OTHER): Payer: Medicare Other | Admitting: Family Medicine

## 2016-06-12 ENCOUNTER — Encounter: Payer: Self-pay | Admitting: Family Medicine

## 2016-06-12 DIAGNOSIS — Z23 Encounter for immunization: Secondary | ICD-10-CM

## 2016-06-12 DIAGNOSIS — G629 Polyneuropathy, unspecified: Secondary | ICD-10-CM | POA: Diagnosis not present

## 2016-06-12 DIAGNOSIS — K7031 Alcoholic cirrhosis of liver with ascites: Secondary | ICD-10-CM | POA: Diagnosis not present

## 2016-06-12 DIAGNOSIS — G5793 Unspecified mononeuropathy of bilateral lower limbs: Secondary | ICD-10-CM

## 2016-06-12 DIAGNOSIS — E11620 Type 2 diabetes mellitus with diabetic dermatitis: Secondary | ICD-10-CM

## 2016-06-12 MED ORDER — GABAPENTIN 300 MG PO CAPS: 300.0000 mg | ORAL_CAPSULE | Freq: Three times a day (TID) | ORAL | 3 refills | Status: DC

## 2016-06-12 MED FILL — GABAPENTIN 300 MG CAPSULE: 300 | 30 days supply | Qty: 90 | Fill #0

## 2016-06-12 NOTE — Assessment & Plan Note (Signed)
Actually lowish BPs.  Cardiazem is more for PAF than A fib.  Also, I intravascularly deplete him as we push diuresis with low albumin.

## 2016-06-12 NOTE — Assessment & Plan Note (Signed)
We need to get off both fluid weight and obesity.  He is working with PT and OT to increase his activity.

## 2016-06-12 NOTE — Progress Notes (Signed)
   Subjective:    Patient ID: Matthew [name], male    DOB: Mar 06, 1956, 60 y.o.   MRN: [id number]  HPI Fu hospitalization for MRSA sepsis beginning with right leg cellulitis plus multiple co morbid problems. 1. MRSA sepsis.  Completed antibiotics.  No fever.  Feels well 2. Chronic venous insufficiency both legs.  Right leg feels the same as left, but does not look the same. 3. Anasarca, coming from morbid obesity, alcoholic cirrhosis and venous insufficiency.  Weight topped out at 440 lbs in hospital as he needed fluid support during sepsis.  With diuresis, wt is improving.  Still feel he has lots of extra fluid on board. 4. Cirrhosis.  Albumin is 2.2 which contributes to his fluid retentions. 5. AKI.  Tough to now what his baseline renal function is.  Looking back for a year, there are creats under 1.0.  Be careful to not push diuresis too hard or too fast. 6. Depression.  He is upbeat today because he is feeling better and pleased with the weight.   7.  Needs flu shot.    Review of Systems     Objective:   Physical Exam Lungs clear Abd massive pannus. Ext 3+ edema bilaterally right leg more red than left Not more tender.        Assessment & Plan:

## 2016-06-12 NOTE — Assessment & Plan Note (Signed)
If I were just looking at his bp, I would stop the cardiazem.  He needs this for rate control.

## 2016-06-12 NOTE — Assessment & Plan Note (Signed)
Recheck creat with his diuresis.

## 2016-06-12 NOTE — Patient Instructions (Signed)
Google panniculectomy and morbidly obese I will call with your blood test results. See me in 1-2 weeks whatever works for you. Likely, I will leave you on the same dose of fluid pills because I don't think you are at steady state. Flu shot today. The main kidney test we follow is creatinine.

## 2016-06-12 NOTE — Assessment & Plan Note (Signed)
Not wanting to take metformin.  Wants to control with diet.

## 2016-06-12 NOTE — Assessment & Plan Note (Signed)
I do not think lingering erythema of right leg is active cellulitis.  Continue diuresis.  No antibiotics for now.

## 2016-06-12 NOTE — Assessment & Plan Note (Signed)
Good control today.  I hope we are starting a virtuous cycle.

## 2016-06-13 ENCOUNTER — Ambulatory Visit: Payer: Self-pay | Admitting: Internal Medicine

## 2016-06-13 LAB — BASIC METABOLIC PANEL
BUN: 19 mg/dL (ref 7–25)
CALCIUM: 8.7 mg/dL (ref 8.6–10.3)
CO2: 33 mmol/L — AB (ref 20–31)
CREATININE: 1.43 mg/dL — AB (ref 0.70–1.33)
Chloride: 96 mmol/L — ABNORMAL LOW (ref 98–110)
Glucose, Bld: 99 mg/dL (ref 65–99)
Potassium: 3.8 mmol/L (ref 3.5–5.3)
SODIUM: 138 mmol/L (ref 135–146)

## 2016-06-18 ENCOUNTER — Other Ambulatory Visit: Payer: Self-pay | Admitting: Family Medicine

## 2016-06-18 ENCOUNTER — Encounter: Payer: Self-pay | Admitting: Family Medicine

## 2016-06-18 ENCOUNTER — Ambulatory Visit (INDEPENDENT_AMBULATORY_CARE_PROVIDER_SITE_OTHER): Payer: Medicare Other | Admitting: Family Medicine

## 2016-06-18 DIAGNOSIS — K7031 Alcoholic cirrhosis of liver with ascites: Secondary | ICD-10-CM | POA: Diagnosis present

## 2016-06-18 DIAGNOSIS — F331 Major depressive disorder, recurrent, moderate: Secondary | ICD-10-CM

## 2016-06-18 DIAGNOSIS — I872 Venous insufficiency (chronic) (peripheral): Secondary | ICD-10-CM

## 2016-06-18 DIAGNOSIS — E11628 Type 2 diabetes mellitus with other skin complications: Secondary | ICD-10-CM

## 2016-06-18 NOTE — Progress Notes (Signed)
   Subjective:    Patient ID: Matthew [name], male    DOB: 10/02/1955, 60 y.o.   MRN: [id number]  HPI Recheck anasarca, liver cirrhosis, morbid obesity and chronic venous insufficiency.  Patient is a little down that weight only dropped 4 lbs.  He states he is working hard on diet.  Would like to see a nutritionist.   Optimal dose of diuretic/best weight is unclear.  Needs creat today to decide how hard to push diuresis. No recurrent cellulitis symptoms - denies fever, increase in leg pain or weakness.   Depression OK.  Son now living with him.  Son has some of the usual teenage angst.    Review of Systems     Objective:   Physical Exam Lungs clear Pannus Ext 4+ edema and chronic venous stasis changes.        Assessment & Plan:

## 2016-06-18 NOTE — Assessment & Plan Note (Signed)
Unclear dry weight.  Work on both diuresis and true adipose wt loss.

## 2016-06-18 NOTE — Assessment & Plan Note (Signed)
Stable to improved.  

## 2016-06-18 NOTE — Assessment & Plan Note (Signed)
At goal of A1C on no meds.  Strongly encourage wt loss.

## 2016-06-18 NOTE — Assessment & Plan Note (Signed)
Likely needs further diuresis but wait for creat before adjusting dose.

## 2016-06-18 NOTE — Patient Instructions (Signed)
Someone should call about the nutrition referral I will call tomorrow with the lab results and we will decide about diuretic dosing. See me in two weeks.

## 2016-06-19 LAB — MICROALBUMIN / CREATININE URINE RATIO
Creatinine, Urine: 93 mg/dL (ref 20–370)
MICROALB UR: 1.2 mg/dL
Microalb Creat Ratio: 13 mcg/mg creat (ref <30)

## 2016-06-19 LAB — BASIC METABOLIC PANEL WITH GFR
BUN: 20 mg/dL (ref 7–25)
CALCIUM: 9.2 mg/dL (ref 8.6–10.3)
CO2: 29 mmol/L (ref 20–31)
CREATININE: 1.39 mg/dL — AB (ref 0.70–1.33)
Chloride: 96 mmol/L — ABNORMAL LOW (ref 98–110)
GFR, EST AFRICAN AMERICAN: 64 mL/min (ref >=60)
GFR, Est Non African American: 55 mL/min — ABNORMAL LOW (ref >=60)
Glucose, Bld: 121 mg/dL — ABNORMAL HIGH (ref 65–99)
Potassium: 4 mmol/L (ref 3.5–5.3)
SODIUM: 136 mmol/L (ref 135–146)

## 2016-06-19 MED ORDER — TORSEMIDE 100 MG PO TABS: ORAL_TABLET | ORAL | Status: DC

## 2016-06-19 NOTE — Addendum Note (Signed)
Addended by: Zenia Resides on: 06/19/2016 04:31 PM   Modules accepted: Orders

## 2016-06-24 ENCOUNTER — Telehealth: Payer: Self-pay | Admitting: *Deleted

## 2016-06-24 ENCOUNTER — Telehealth: Payer: Self-pay | Admitting: Family Medicine

## 2016-06-24 NOTE — Telephone Encounter (Signed)
Called patient.  A place on his foot "broke open" and bleed a little.  Per patient, nurse and he both think it doesn't look bad or infected.  I will anticipate getting some communication from [facility].

## 2016-06-24 NOTE — Telephone Encounter (Signed)
Pt would like for Dr.Hensel to call back [facility] about his foot. Pt says [facility] sent something about taking care of pt's foot. Please advise. Thanks! ep

## 2016-06-24 NOTE — Telephone Encounter (Signed)
Herbert Deaner, PT with [facility] called stating patient reported increase pain of right foot.  Patient has broken skin on right lateral midfoot; dry blood was noticed, no active drainage; area is 1/5x1/5. Foot is also swollen 1x1 in.  Patient was educated on keeping foot clean and covered.  Jim informed home health nurse, but need to know will there be any additional orders.  Please give him a call at [phone].  Derl Barrow, RN

## 2016-06-24 NOTE — Telephone Encounter (Signed)
Matthew [name] with [name] would like a verbal order for social work to come out and visit patient.  He is down to his last cardizem and he informed her that he couldn't afford to buy anymore.  Please call her back with the verbal ok to bring social work out. Sadarius Norman,CMA

## 2016-06-25 NOTE — Telephone Encounter (Signed)
Done

## 2016-06-26 MED FILL — CARTIA XT 180 MG CAPSULE SA: 180 | 30 days supply | Qty: 30 | Fill #0

## 2016-06-26 NOTE — Telephone Encounter (Signed)
Please have patient schedule appointment 

## 2016-06-26 NOTE — Telephone Encounter (Signed)
LMOVM for Clair Gulling to return call.  Will inform of the message. Fleeger, Salome Spotted, CMA

## 2016-06-27 ENCOUNTER — Telehealth: Payer: Self-pay | Admitting: Family Medicine

## 2016-06-27 NOTE — Telephone Encounter (Signed)
Matthew [name] from [facility] would like to extend OT starting next week, twice a week for two more weeks. Thanks! ep

## 2016-06-30 ENCOUNTER — Telehealth: Payer: Self-pay | Admitting: Family Medicine

## 2016-06-30 NOTE — Telephone Encounter (Signed)
[name] the home health nurse is calling to inform the doctor that the patient's leg is still swollen and red purple. He is not sleeping well and he is having more pain  In his legs and feet. jw

## 2016-07-01 ENCOUNTER — Telehealth: Payer: Self-pay | Admitting: Family Medicine

## 2016-07-01 NOTE — Telephone Encounter (Signed)
Rod Holler from [facility] wanted to let Dr. Andria Frames know she believes pt is on a emotional downhill spiral. Pt states he is not a harm to himself because of his son. Pt makes statements like maybe I won't even go to my appointment, I'm going to die anyway, ect.Lonna Duval pt always has to have a problem, asked pt to make a list of issues to discuss at appointment. ep

## 2016-07-02 ENCOUNTER — Ambulatory Visit (INDEPENDENT_AMBULATORY_CARE_PROVIDER_SITE_OTHER): Payer: Medicare Other | Admitting: Family Medicine

## 2016-07-02 ENCOUNTER — Encounter: Payer: Self-pay | Admitting: Family Medicine

## 2016-07-02 DIAGNOSIS — F331 Major depressive disorder, recurrent, moderate: Secondary | ICD-10-CM | POA: Diagnosis not present

## 2016-07-02 DIAGNOSIS — K7031 Alcoholic cirrhosis of liver with ascites: Secondary | ICD-10-CM

## 2016-07-02 DIAGNOSIS — G629 Polyneuropathy, unspecified: Secondary | ICD-10-CM

## 2016-07-02 DIAGNOSIS — G5793 Unspecified mononeuropathy of bilateral lower limbs: Secondary | ICD-10-CM

## 2016-07-02 MED ORDER — DULOXETINE HCL 20 MG PO CPEP: 20.0000 mg | ORAL_CAPSULE | Freq: Two times a day (BID) | ORAL | 3 refills | Status: DC

## 2016-07-02 MED FILL — DULoxetine HCL 20 MG CPEP: 20 | 30 days supply | Qty: 60 | Fill #0

## 2016-07-02 NOTE — Assessment & Plan Note (Signed)
While I do not like his lack of food security, I hope this restriction leads to weight loss.  We again talked about increasing activity.

## 2016-07-02 NOTE — Telephone Encounter (Signed)
Called.  Patient reassured me that he is coming in this afternoon.  Issues to address: 1. Depression 2. Pain, maybe increase gabapentin.   3. Leg cramps.  Likely recheck BMP

## 2016-07-02 NOTE — Assessment & Plan Note (Signed)
Add duloxetine, which may also help neuropathy of feet.

## 2016-07-02 NOTE — Assessment & Plan Note (Signed)
Add duloxetine, which may also help with depression.

## 2016-07-02 NOTE — Patient Instructions (Addendum)
We will try duloxetine.  I hope it helps your thinking (feel sharper), you mood (feel happier) and the pain in your feet.  I would take improvement in any one of them.    We will decide in one month whether the med is worth it or not.  You may not be happy with a 9 lb wt loss, but I am.  At least we are moving in the right direction.    Blood work next visit.  See me in two weeks.

## 2016-07-02 NOTE — Assessment & Plan Note (Signed)
Pleased with 9lb weight loss.

## 2016-07-02 NOTE — Progress Notes (Signed)
   Subjective:    Patient ID: Matthew [name], male    DOB: 1956-08-14, 60 y.o.   MRN: [id number]  HPI See phone note that I signed today. 1. Depression.  Denies HI or SI.  Patient is reluctant to admit he is depressed and even more reluctant to start medications.  He will admit that it has been a bad three years.  Lacks energy.  Difficulty thinking.  Easily frustrated.  At times feels hopeless/helpless.  Son is motivation to live.   2. Leg/foot pain worse.  Gabapentin helps.  Reluctant to increase the dose.  He dose not want to feel like a zombie. 3. Ascites/anasarca.  Improved.  9lb weight loss over past two weeks.   4. Morbid obesity - maybe improving some.  States eating far less because he cannot afford food.     Review of Systems     Objective:   Physical Exam Wt down 9 lbs.  Legs chronic venous changes only.  No skin breakdown or extra redness.        Assessment & Plan:

## 2016-07-02 NOTE — Telephone Encounter (Signed)
Called and gave verbal order.

## 2016-07-03 ENCOUNTER — Telehealth: Payer: Self-pay | Admitting: *Deleted

## 2016-07-03 NOTE — Telephone Encounter (Signed)
Matthew [name], Physical therapist with [facility] called requesting verbal orders for physical therapy.  One visit was missed this week due to scheduling difficulties. Requesting to make up the missed visit and add one additional visit.  Patient also is unable to pick up spironolactone and torsemide due to money concerns.  Verbal orders given for additional visit and make up the missed visit from Dr. Andria Frames.  Derl Barrow, RN

## 2016-07-03 NOTE — Telephone Encounter (Signed)
Noted and agree. 

## 2016-07-07 ENCOUNTER — Telehealth: Payer: Self-pay | Admitting: Family Medicine

## 2016-07-07 ENCOUNTER — Ambulatory Visit: Payer: Self-pay | Admitting: Family Medicine

## 2016-07-07 NOTE — Telephone Encounter (Signed)
Pt is calling because the Cymbalta is making him very dizzy and he doesn't like the feel it gives him. jw

## 2016-07-07 NOTE — Telephone Encounter (Signed)
Called and discussed.  Stop duloxetine for now.  He will consider restarting at 20 mg daily.  Right now, he is reluctant due to the severity of his side effects (mental clouding, lightheadedness, fatigue.)

## 2016-07-15 ENCOUNTER — Other Ambulatory Visit: Payer: Self-pay | Admitting: Family Medicine

## 2016-07-15 MED FILL — TORSEMIDE 100 MG TABLET: 100 | 30 days supply | Qty: 60 | Fill #1

## 2016-07-15 MED FILL — spIRONOLACTONE 100 MG TAB: 100 | 34 days supply | Qty: 34 | Fill #1

## 2016-07-15 MED FILL — GABAPENTIN 300 MG CAPSULE: 300 | 30 days supply | Qty: 90 | Fill #1

## 2016-07-24 ENCOUNTER — Ambulatory Visit: Payer: Self-pay | Admitting: Family Medicine

## 2016-07-29 ENCOUNTER — Ambulatory Visit: Payer: Self-pay | Admitting: Family Medicine

## 2016-07-31 ENCOUNTER — Ambulatory Visit: Payer: Self-pay | Admitting: Family Medicine

## 2016-07-31 ENCOUNTER — Encounter: Payer: Self-pay | Admitting: Family Medicine

## 2016-07-31 ENCOUNTER — Ambulatory Visit (INDEPENDENT_AMBULATORY_CARE_PROVIDER_SITE_OTHER): Payer: Medicare Other | Admitting: Family Medicine

## 2016-07-31 DIAGNOSIS — I480 Paroxysmal atrial fibrillation: Secondary | ICD-10-CM

## 2016-07-31 DIAGNOSIS — K7031 Alcoholic cirrhosis of liver with ascites: Secondary | ICD-10-CM | POA: Diagnosis not present

## 2016-07-31 DIAGNOSIS — Z659 Problem related to unspecified psychosocial circumstances: Secondary | ICD-10-CM | POA: Diagnosis not present

## 2016-07-31 DIAGNOSIS — F331 Major depressive disorder, recurrent, moderate: Secondary | ICD-10-CM

## 2016-07-31 NOTE — Patient Instructions (Signed)
Hang in there See me in one month. I will call tomorrow with the blood work.

## 2016-08-01 LAB — BASIC METABOLIC PANEL WITH GFR
BUN: 16 mg/dL (ref 7–25)
CHLORIDE: 95 mmol/L — AB (ref 98–110)
CO2: 32 mmol/L — AB (ref 20–31)
Calcium: 8.7 mg/dL (ref 8.6–10.3)
Creat: 1.38 mg/dL — ABNORMAL HIGH (ref 0.70–1.33)
GFR, Est African American: 64 mL/min (ref >=60)
GFR, Est Non African American: 56 mL/min — ABNORMAL LOW (ref >=60)
GLUCOSE: 150 mg/dL — AB (ref 65–99)
POTASSIUM: 3.2 mmol/L — AB (ref 3.5–5.3)
Sodium: 137 mmol/L (ref 135–146)

## 2016-08-01 NOTE — Progress Notes (Signed)
   Subjective:    Patient ID: Matthew [name], male    DOB: 05-29-1956, 60 y.o.   MRN: [id number]  HPI FU anasarca, depression, financial concerns. 1. Depression is managable.  No SI or HI.  Son remains his reason for living.  Son apparently is on parole, has a meeting soon, if he fails a drug test his parole will be revoked and he will go to jail.  [name] now relies on his son for help with shopping etc.  This would be a major blow.  Son aware of the high stakes of the drug screen. 2.  Anasarca  Wt down another few pounds.  Needs BMP today 3. Ongoing financial concerns.  He is not sure whether he will be able to continue to afford his medications.  The diltiazem is likely the first to go.  Talked about med assistance.  [name] is following his pattern of not being proactive in accessing community resources.    Review of Systems     Objective:   Physical Exam  Affect OK.  Not smiling but he is reasonable.  Lungs clear Cardiac RRR without m or g Abd - pannus unchanged. Ext 3+ edema.  Chronic venous insufficiency changes.  No cellulitis.        Assessment & Plan:

## 2016-08-01 NOTE — Assessment & Plan Note (Signed)
Chronic and unchanged ?

## 2016-08-01 NOTE — Assessment & Plan Note (Signed)
Worried if he cannot afford diltiazem that he will be at risk for RVR if/when he has more PAF.  Discussed community resources again.

## 2016-08-01 NOTE — Assessment & Plan Note (Signed)
Stable. At high risk if parole problem with son.

## 2016-08-01 NOTE — Assessment & Plan Note (Signed)
Anasarca continues.  Recheck BMP for K and creat Called with results.  Creat OK.  Asked to increase bannana and citrus intake for low K+

## 2016-08-15 ENCOUNTER — Telehealth: Payer: Self-pay | Admitting: Family Medicine

## 2016-08-15 DIAGNOSIS — I872 Venous insufficiency (chronic) (peripheral): Secondary | ICD-10-CM

## 2016-08-15 DIAGNOSIS — G5793 Unspecified mononeuropathy of bilateral lower limbs: Secondary | ICD-10-CM

## 2016-08-15 NOTE — Telephone Encounter (Signed)
Needs refills on his medicines ( toursemide, gabapentin, spironolactone).  He has no money and no insurance. What can be done?   He has Medicare but doesn't have part D.  Please advise .  He says he knows he is going to die soon and wants to know what to expect

## 2016-08-18 ENCOUNTER — Other Ambulatory Visit: Payer: Self-pay

## 2016-08-18 NOTE — Telephone Encounter (Signed)
Called patient.  Despite multiple suggestions, he has never followed through with the [facility] application.  States now can't afford meds and is out.  Will forward to RN pool for [employer] and to our Education officer, museum for interim options.

## 2016-08-19 MED ORDER — SPIRONOLACTONE 100 MG PO TABS: 50.0000 mg | ORAL_TABLET | Freq: Two times a day (BID) | ORAL | 0 refills | Status: DC

## 2016-08-19 MED ORDER — TORSEMIDE 100 MG PO TABS: ORAL_TABLET | ORAL | 0 refills | Status: DC

## 2016-08-19 MED ORDER — GABAPENTIN 300 MG PO CAPS: 300.0000 mg | ORAL_CAPSULE | Freq: Three times a day (TID) | ORAL | 0 refills | Status: AC

## 2016-08-19 MED FILL — spIRONOLACTONE 100 MG TAB: 100 | 30 days supply | Qty: 30 | Fill #0

## 2016-08-19 MED FILL — TORSEMIDE 100 MG TABLET: 100 | 30 days supply | Qty: 45 | Fill #0

## 2016-08-19 MED FILL — GABAPENTIN 300 MG CAPSULE: 300 | 30 days supply | Qty: 90 | Fill #0

## 2016-08-19 NOTE — Telephone Encounter (Signed)
Pt called and said that he is out of medication and doesn't have a way to get this medication. He can not leave the house or go walking through a big pharmacy such as [facility]. He doesn't know what he is to do. jw

## 2016-08-19 NOTE — Telephone Encounter (Signed)
Thanks to Neoma Laming for letting me know of this possibility.  One month Rx sent for the three prescriptions.  Patient informed.  Also told his we are working on a [facility] case Freight forwarder.  He is upbeat about this possibility.

## 2016-08-20 ENCOUNTER — Encounter: Payer: Self-pay | Admitting: Licensed Clinical Social Worker

## 2016-08-20 NOTE — Progress Notes (Signed)
LCSW informed by Jeannene Patella, care manager with [facility] that she will be working with patient for ongoing case management services. Pam will reach out to patient within the next few days.    Dr. Andria Frames notified.   Casimer Lanius, LCSW Licensed Clinical Social Worker [facility]   [phone] 9:22 AM

## 2016-08-26 ENCOUNTER — Other Ambulatory Visit: Payer: Self-pay

## 2016-08-26 NOTE — Patient Outreach (Signed)
[facility]) Care Management   08/26/16  SABASTION DEAN [birth year] [id number]  Attempted to reach patient without success. Left HIPAA compliant voicemail with RNCM contact information and invited callback. Also provided contact information for primary RNCM, Erenest Rasher.  [place] R. Alex Mcmanigal, RN, BSN, [place] Management Coordinator [phone]

## 2016-08-29 ENCOUNTER — Other Ambulatory Visit: Payer: Self-pay

## 2016-08-29 MED FILL — DILTIAZEM 24HR ER 180 MG CA: 180 | 30 days supply | Qty: 30 | Fill #1

## 2016-08-29 NOTE — Patient Outreach (Addendum)
I called Matthew [name] to address his medication needs.  I had to leave a HIPPA compliant message for the patient to call me back.  If I do not hear back from the patient today, I will call the patient back at a later date. This was my first attempt to reach him.    Deanne Coffer, PharmD, [employer] [phone]

## 2016-09-04 ENCOUNTER — Other Ambulatory Visit: Payer: Self-pay

## 2016-09-05 ENCOUNTER — Telehealth: Payer: Self-pay | Admitting: Family Medicine

## 2016-09-05 MED ORDER — DOXYCYCLINE HYCLATE 100 MG PO TABS: 100.0000 mg | ORAL_TABLET | Freq: Two times a day (BID) | ORAL | 0 refills | Status: DC

## 2016-09-05 MED FILL — DOXYCYCLINE HYCLATE 100 MG: 100 | 10 days supply | Qty: 20 | Fill #0

## 2016-09-05 NOTE — Telephone Encounter (Signed)
Called patient back regarding possible cellulitis of right leg.  Patient also reported non-productive cough, denies fever.  Patient is requesting antibiotics to avoid going to hospital.  Patient is requesting a call from PCP.  Derl Barrow, RN

## 2016-09-05 NOTE — Progress Notes (Addendum)
LCSW informed that patient is unable to affored $15.95 co-pay to pick up his antibiotics. Patient only has $10.00 until he get his next check.  Discussed patient's need with [facility] AD.   [facility] will cover the cost of patient's medication via indigent Rx fund with zero cost to patient. Called patient to provide an update. Patient's son will pick up the medication.    Casimer Lanius, LCSW Licensed Clinical Social Worker [place]   [phone] 11:46 AM

## 2016-09-05 NOTE — Telephone Encounter (Signed)
Pt is unable to come in. Pt has cellulitis and believes he has the flu. Pt states he needs anabiotics called in today (antibiotics under $10) or he is afraid he will end up in the hospital. Please advise. Thanks! ep

## 2016-09-05 NOTE — Telephone Encounter (Signed)
Discussed with patient and will Rx with doxy

## 2016-09-06 NOTE — Patient Outreach (Signed)
    Unsuccessful attempt made to contact patient via telephone.  Plan: Make another attempt next week

## 2016-09-08 ENCOUNTER — Other Ambulatory Visit: Payer: Self-pay

## 2016-09-08 NOTE — Patient Outreach (Signed)
    Unsuccessful telephone contact made to patient for community care coordination, schedule home visit to assess needs. HIPPA compliant message left with this RNCM's contact information and request for return call.  Plan:: Make another attempt to contact with patient later this month.

## 2016-09-08 NOTE — Patient Outreach (Signed)
I called Matthew [name] since he called me and left me a message on Friday, September 05, 2016.  He stated he was asked to call me in regards to a medication plan.  I stated that I could assist with reviewing plans with him.  I was able to look up different stand alone prescription drug plans and review them with him.  He stated he was going to see Dr. Andria [name] on Thursday, September 11, 2016.  I stated my resident would be there and could review the plans with him at that time.  He stated he would appreciate it.  I will have Matthew [name], PharmD review the three stand alone Medicare Part D plans that would be beneficial from Matthew [name] that would assist him to afford his medications.  He does not qualify for Extra Help.  All his medications are currently generic.    Matthew [name], PharmD, [employer] [phone]

## 2016-09-10 ENCOUNTER — Other Ambulatory Visit: Payer: Self-pay

## 2016-09-11 ENCOUNTER — Ambulatory Visit: Payer: Self-pay | Admitting: Family Medicine

## 2016-09-11 ENCOUNTER — Telehealth: Payer: Self-pay | Admitting: *Deleted

## 2016-09-11 NOTE — Telephone Encounter (Signed)
Very difficult patient management issue.  He has the horrible triad: Important medical problem (cirrhosis), important psychiatric problem (major depression) and important socioeconomic problems (has enough assets such that he does not qualify for Medicaid or other support but can't afford meds.)  With his depression, he tends to isolate himself in his home and become needy.  When I consider today's message in this light, I don't know whether to respond with support (sure, I will arrange) or tough love (no, it has been six weeks since you have been here, get to the [facility]    I really think we need a team solution.  I will refer this call back to case manager.  I am happy to support (with any needed orders) whatever solution they mutually agree on.

## 2016-09-11 NOTE — Telephone Encounter (Signed)
Pt called in to cancel appt for this afternoon. He is too swollen to drive and he doesn't have money to get an uber. Wants dr to order lab work for his kidneys and have a nurse come out to his house to draw. Please advise and call pt back. Cristhian Vanhook Kennon Holter, CMA

## 2016-09-11 NOTE — Patient Outreach (Signed)
[facility]) Care Management  09/11/2016   Matthew [name] 20-Jan-1956 [id number]  Subjective:  I have been out of work for [employer]. I have a son that is recovering from xanax addiction.   Objective:  Telephonic encounter  Current Medications:  Current Outpatient Prescriptions  Medication Sig Dispense Refill  . aspirin EC 81 MG EC tablet Take 1 tablet (81 mg total) by mouth daily. 30 tablet 11  . CARTIA XT 180 MG 24 hr capsule TAKE 1 CAPSULE BY MOUTH DAILY. 90 capsule 3  . doxycycline (VIBRA-TABS) 100 MG tablet Take 1 tablet (100 mg total) by mouth 2 (two) times daily. 20 tablet 0  . gabapentin (NEURONTIN) 300 MG capsule Take 1 capsule (300 mg total) by mouth 3 (three) times daily. [facility] 90 capsule 0  . NYSTATIN powder APPLY TOPICALLY 3 (THREE) TIMES DAILY. 45 g 12  . spironolactone (ALDACTONE) 100 MG tablet Take 0.5 tablets (50 mg total) by mouth 2 (two) times daily. [facility] 30 tablet 0  . torsemide (DEMADEX) 100 MG tablet 100 mg qam and 50 mg qpm 45 tablet 0   No current facility-administered medications for this visit.     Functional Status:  In your present state of health, do you have any difficulty performing the following activities: 09/10/2016 06/02/2016  Hearing? N N  Vision? Y N  Difficulty concentrating or making decisions? Y N  Walking or climbing stairs? Y Y  Dressing or bathing? Y N  Doing errands, shopping? [place] Don[place]  Preparing Food and eating ? Y -  Using the Toilet? N -  In the past six months, have you accidently leaked urine? N -  Do you have problems with loss of bowel control? N -  Managing your Medications? N -  Managing your Finances? N -  Housekeeping or managing your Housekeeping? Y -  Some recent data might be hidden    Fall/Depression Screening: PHQ 2/9 Scores 09/10/2016 07/31/2016 07/02/2016 06/18/2016 06/12/2016 05/07/2016 05/07/2016  PHQ - 2 Score 1 0 0 0 0 2 0  PHQ- 9 Score - - - - - 11 -  Exception Documentation - - -  - - - -  Not completed - - - - - - -   Fall Risk  09/10/2016 07/02/2016 06/12/2016 05/07/2016 02/28/2016  Falls in the past year? Yes No No No Yes  Number falls in past yr: 1 - - - 1  Injury with Fall? No - - - No  Risk for fall due to : History of fall(s);Impaired balance/gait;Impaired mobility;Impaired vision;Medication side effect - - - -  Follow up Follow up appointment - - - [facility] CM Care Plan Problem One   Flowsheet Row Most Recent Value  Care Plan Problem One  Patient states he cannot afford his medications  Role Documenting the Problem One  Care Management [place] for Problem One  Active  [facility] Long Term Goal (31-90 days)  In the next 31 days, patient wilmeet with [place] to asses community resources available to assist patient with obtaining his medications  [facility] Long Term Goal Start Date  09/10/16  Interventions for Problem One Long Term Goal  telephone assessment of needs, creation of care coordination phone call  [facility] CM Short Term Goal #1 (0-30 days)  In the next 21 days, patiient will be able to name 2 community resources available to assist with obtaining medications.   [facility] CM Short Term Goal #1  Start Date  09/10/16     Assessment:  Patient with multiple chronic illnesses with low desire to make changes.  Plan:  Home visit later this month for assessment of community care coordination

## 2016-09-12 ENCOUNTER — Other Ambulatory Visit: Payer: Self-pay

## 2016-09-12 ENCOUNTER — Telehealth: Payer: Self-pay | Admitting: Family Medicine

## 2016-09-12 NOTE — Telephone Encounter (Signed)
Pt calling because he now has a uber that would be able to bring him to his appointments. He will be out of antibiotics tomorrow. He really needs to speak and see Dr. Andria Frames.The first appointment available in January and he doesn't want to wait that long and he doesn't think a resident could handle all his complicated issues. Please call in antibiotics or call him ASAP. jw

## 2016-09-12 NOTE — Patient Outreach (Addendum)
[place] [facility]) Care Management   09/12/2016  Matthew [name] 1956/03/31 [id number]  Matthew [name] is an 60 y.o. male  Subjective:  My income is $1700 per month I cannot afford my medications.  Objective:   ROS  Overweight, middle aged man with long hair. Patient has 1-2 plus edema of lower extremities with redness noted.  Left lower extremity is   Physical Exam  ROS  Encounter Medications:   Outpatient Encounter Prescriptions as of 09/12/2016  Medication Sig  . aspirin EC 81 MG EC tablet Take 1 tablet (81 mg total) by mouth daily.  [name] CARTIA XT 180 MG 24 hr capsule TAKE 1 CAPSULE BY MOUTH DAILY.  [name] doxycycline (VIBRA-TABS) 100 MG tablet Take 1 tablet (100 mg total) by mouth 2 (two) times daily.  [name] gabapentin (NEURONTIN) 300 MG capsule Take 1 capsule (300 mg total) by mouth 3 (three) times daily. [facility]  . NYSTATIN powder APPLY TOPICALLY 3 (THREE) TIMES DAILY.  [name] spironolactone (ALDACTONE) 100 MG tablet Take 0.5 tablets (50 mg total) by mouth 2 (two) times daily. [facility]  . torsemide (DEMADEX) 100 MG tablet 100 mg qam and 50 mg qpm   No facility-administered encounter medications on file as of 09/12/2016.     Functional Status:   In your present state of health, do you have any difficulty performing the following activities: 09/10/2016 06/02/2016  Hearing? N N  Vision? Y N  Difficulty concentrating or making decisions? Y N  Walking or climbing stairs? Y Y  Dressing or bathing? Y N  Doing errands, shopping? Tempie Donning  Preparing Food and eating ? Y -  Using the Toilet? N -  In the past six months, have you accidently leaked urine? N -  Do you have problems with loss of bowel control? N -  Managing your Medications? N -  Managing your Finances? N -  Housekeeping or managing your Housekeeping? Y -  Some recent data might be hidden    Fall/Depression Screening:    PHQ 2/9 Scores 09/10/2016 07/31/2016 07/02/2016 06/18/2016 06/12/2016 05/07/2016  05/07/2016  PHQ - 2 Score 1 0 0 0 0 2 0  PHQ- 9 Score - - - - - 11 -  Exception Documentation - - - - - - -  Not completed - - - - - - -   THN CM Care Plan Problem One   Flowsheet Row Most Recent Value  Care Plan Problem One  Patient states he cannot afford his medications  Role Documenting the Problem One  Care Management [place] for Problem One  Active  THN Long Term Goal (31-90 days)  In the next 31 days, patient wilmeet with [place] to asses community resources available to assist patient with obtaining his medications  THN Long Term Goal Start Date  09/10/16  Interventions for Problem One Long Term Goal  Initial home visit for assessment of community care needs  [facility] CM Short Term Goal #1 (0-30 days)  In the next 21 days, patiient will be able to name 2 community resources available to assist with obtaining medications.   THN CM Short Term Goal #1 Start Date  09/10/16  Interventions for Short Term Goal #1  Patient did not attend  appointment with primary care physician on yesterday, did not meet with pharmacist     Assessment:   Patient states he cancelled yesterday's appointment with his primary care physician because he was not able to drive  his stick shift car, could not afford to take a taxi or uber, has only $1.00 in his account.   Patient reports living alone most of the time, his son has taken a trip to [place].  Patient states he does not have any social support, has a brother that lives in another state. Patient states he can perform most of his ADLs, IADLs most of the times with the exception of cleaning his tub, taking out his trash. Patient states he does not attend any programs for psychosocial support.  Due to his income, patient does not qualify for [place] Medicaid Program or In Home Aide Services. Patient states he may need assistance with obtaining transportation to his next medical appointment if he cannot drive. Patient was agreeable to the  asssisgnment of EMMI Videos on heart failure, diabetes, hypertension, advanced planning.   Plan:   Telephone contact later this month to follow up with patient's progress with meeting his case managment goals.

## 2016-09-15 ENCOUNTER — Other Ambulatory Visit: Payer: Self-pay | Admitting: Pharmacist

## 2016-09-15 NOTE — Telephone Encounter (Signed)
Matthew [name] and I will see this Thursday morning.  He may need help with transportation.

## 2016-09-15 NOTE — Telephone Encounter (Signed)
Patient called again and is still wanting to speak with MD.  Informed him that provider was not in the office today but that I would send him another message. Chozen Latulippe,CMA

## 2016-09-15 NOTE — Telephone Encounter (Signed)
Pt is calling to see when the doctor will be calling him and also getting some more antibiotics. jw

## 2016-09-15 NOTE — Telephone Encounter (Signed)
Spoke with [name].  Feels he is putting on fluid wt.  Needs to be seen.  We agreed on Thursday morning.

## 2016-09-15 NOTE — Patient Outreach (Signed)
[place] [facility]) Care Management  09/15/2016  Matthew [name] 08/07/1956 [id number]   60 year old male referred to [place] for medication assistance for assistance with choosing a Medicare Part D plan.  Called patient to schedule followup visit as patient missed his primary care appointment last week.    Plan: Will followup in 1-2 days via telephone to assist [name] with medication assistance.  Bennye Alm, PharmD [facility] PGY2 Pharmacy Resident [phone]

## 2016-09-16 ENCOUNTER — Other Ambulatory Visit: Payer: Self-pay | Admitting: Pharmacist

## 2016-09-16 NOTE — Telephone Encounter (Signed)
Pt scheduled for 12-21 @ 11:00. ep

## 2016-09-17 ENCOUNTER — Other Ambulatory Visit: Payer: Self-pay | Admitting: Pharmacist

## 2016-09-17 ENCOUNTER — Other Ambulatory Visit: Payer: Self-pay | Admitting: Family Medicine

## 2016-09-17 ENCOUNTER — Telehealth: Payer: Self-pay | Admitting: Licensed Clinical Social Worker

## 2016-09-17 DIAGNOSIS — I872 Venous insufficiency (chronic) (peripheral): Secondary | ICD-10-CM

## 2016-09-17 MED FILL — GABAPENTIN 300 MG CAPSULE: 300 | 30 days supply | Qty: 90 | Fill #2

## 2016-09-17 MED FILL — DILTIAZEM 24HR ER 180 MG CA: 180 | 30 days supply | Qty: 30 | Fill #2

## 2016-09-17 MED FILL — TORSEMIDE 100 MG TABLET: 100 | 30 days supply | Qty: 45 | Fill #0

## 2016-09-17 MED FILL — spIRONOLACTONE 100 MG TAB: 100 | 30 days supply | Qty: 30 | Fill #0

## 2016-09-17 NOTE — Progress Notes (Addendum)
LCSW spoke to [facility], [facility] care manager.  Patient needs transportation to his [facility] medical appointment tomorrow. Patient also called [facility] office asking for assistance to his appointment. Taxi voucher faxed to [facility], they will pick patient up at 10:15 and transport him to his appointment.   Called patient to provide an update and to inform him be ready at 1:15.  No answer, left message to call LCSW.  Return call from patient, informed him of pick up time with [facility].  Patient appreciative of transportation assistance states his son will accompany him to his appointment  Casimer Lanius, LCSW Licensed Clinical Social Worker [place]   [phone] 1:58 PM

## 2016-09-17 NOTE — Telephone Encounter (Signed)
Matthew [name] has arranged already

## 2016-09-17 NOTE — Patient Outreach (Signed)
[place] [facility]) Care Management  [place]   09/17/2016  SANDLER BLODGETT 1956/07/11 [id number]  Subjective: 60 year old male referred to [place] for medication assistance for assistance with choosing a Medicare Part D plan. [facility] pharmacy home visit was performed today with assistance of Everlene Other, PharmD.   Today patient states he is volume overloaded and waiting to see Dr Andria Frames tomorrow for a provider office visit.  He reports drinking ~8 cups (~10 oz) of sweet tea per day and eating a ham sandwich today.    Patient lives with his son but his son is currently unemployed.  Patient states that he cannot afford the $20 Medicare part D and $38 medication copay per month and is unwilling to cut back on his other non-essential expenses during the month.    He states he is about to run out of gabapentin, spironolactone, and torsemide in the next few days and is demanding [place] assist with those medications or he will stop taking all of his medications.    Objective:   Encounter Medications: Outpatient Encounter Prescriptions as of 09/17/2016  Medication Sig Note  . aspirin EC 81 MG EC tablet Take 1 tablet (81 mg total) by mouth daily.   [name] CARTIA XT 180 MG 24 hr capsule TAKE 1 CAPSULE BY MOUTH DAILY.   [name] gabapentin (NEURONTIN) 300 MG capsule Take 1 capsule (300 mg total) by mouth 3 (three) times daily. [facility] [place] [facility]   . NYSTATIN powder APPLY TOPICALLY 3 (THREE) TIMES DAILY. (Patient taking differently: Apply topically 3 (three) times daily as needed. )   . spironolactone (ALDACTONE) 100 MG tablet Take 0.5 tablets (50 mg total) by mouth 2 (two) times daily. [facility]   . torsemide (DEMADEX) 100 MG tablet 100 mg qam and 50 mg qpm 09/17/2016: Last 2 days has been taking 100 mg twice daily  . [DISCONTINUED] doxycycline (VIBRA-TABS) 100 MG tablet Take 1 tablet (100 mg total) by mouth 2 (two) times daily. (Patient not taking: Reported on 09/17/2016)     No facility-administered encounter medications on file as of 09/17/2016.     Functional Status: In your present state of health, do you have any difficulty performing the following activities: 09/10/2016 06/02/2016  Hearing? N N  Vision? Y N  Difficulty concentrating or making decisions? Y N  Walking or climbing stairs? Y Y  Dressing or bathing? Y N  Doing errands, shopping? Tempie Donning  Preparing Food and eating ? Y -  Using the Toilet? N -  In the past six months, have you accidently leaked urine? N -  Do you have problems with loss of bowel control? N -  Managing your Medications? N -  Managing your Finances? N -  Housekeeping or managing your Housekeeping? Y -  Some recent data might be hidden    Fall/Depression Screening: PHQ 2/9 Scores 09/10/2016 07/31/2016 07/02/2016 06/18/2016 06/12/2016 05/07/2016 05/07/2016  PHQ - 2 Score 1 0 0 0 0 2 0  PHQ- 9 Score - - - - - 11 -  Exception Documentation - - - - - - -  Not completed - - - - - - -    Assessment: Medication Assistance: Patient with Medicare part A/B but without medicare part D prescription drug coverage.  Patient unable to afford his medications and is close to the income limits for low income subsidy/extra help.  Patient is hesitant to spend $50 a month on his medications/part D plan and states  he will "just stop taking his medications"  Plan: -Discussed limiting fluid intake to <2 liters/day and limiting sodium intake to less than 2000 mg per day to reduce fluid  -Completed low income subsidy/extra help application with patient.  Results of this application should be mailed to patient within 1-3 weeks.  -Assisted patient with enrollment into a Medicare Part D plan of his choice -Discussed budgeting his monthly expenses and cutting back on large expenses such as cable "cord cutting" to allow him to have extra money to afford his monthly medications. -Will follow up results of low income subsidy/extra help application in 2-3 weeks    Bennye Alm, PharmD, [place] PGY2 Pharmacy Resident [phone]

## 2016-09-18 ENCOUNTER — Ambulatory Visit (INDEPENDENT_AMBULATORY_CARE_PROVIDER_SITE_OTHER): Payer: Medicare Other | Admitting: Family Medicine

## 2016-09-18 ENCOUNTER — Encounter: Payer: Self-pay | Admitting: Family Medicine

## 2016-09-18 VITALS — BP 110/60 | HR 75 | Temp 97.7°F | Ht 70.0 in | Wt >= 6400 oz

## 2016-09-18 DIAGNOSIS — K7031 Alcoholic cirrhosis of liver with ascites: Secondary | ICD-10-CM

## 2016-09-18 DIAGNOSIS — E876 Hypokalemia: Secondary | ICD-10-CM | POA: Diagnosis not present

## 2016-09-18 DIAGNOSIS — E11628 Type 2 diabetes mellitus with other skin complications: Secondary | ICD-10-CM | POA: Diagnosis present

## 2016-09-18 DIAGNOSIS — L03115 Cellulitis of right lower limb: Secondary | ICD-10-CM | POA: Diagnosis not present

## 2016-09-18 LAB — CBC
HCT: 46.1 % (ref 38.5–50.0)
Hemoglobin: 15.6 g/dL (ref 13.2–17.1)
MCH: 32.6 pg (ref 27.0–33.0)
MCHC: 33.8 g/dL (ref 32.0–36.0)
MCV: 96.4 fL (ref 80.0–100.0)
MPV: 9.3 fL (ref 7.5–12.5)
PLATELETS: 366 10*3/uL (ref 140–400)
RBC: 4.78 MIL/uL (ref 4.20–5.80)
RDW: 17.1 % — AB (ref 11.0–15.0)
WBC: 10.7 10*3/uL (ref 3.8–10.8)

## 2016-09-18 LAB — POCT GLYCOSYLATED HEMOGLOBIN (HGB A1C): Hemoglobin A1C: 5.7

## 2016-09-18 MED ORDER — SULFAMETHOXAZOLE-TRIMETHOPRIM 800-160 MG PO TABS: 1.0000 | ORAL_TABLET | Freq: Two times a day (BID) | ORAL | 0 refills | Status: AC

## 2016-09-18 MED FILL — SULFAMETHOXAZOLE/TMP DS TAB: 800-160 | 14 days supply | Qty: 28 | Fill #0

## 2016-09-18 NOTE — Assessment & Plan Note (Signed)
Perhaps it will make a difference that his son was present to hear the importance of weight loss.

## 2016-09-18 NOTE — Assessment & Plan Note (Signed)
Rx with septra.

## 2016-09-18 NOTE — Assessment & Plan Note (Signed)
More fluid retention.  Likely needs diuretic increase, but wait till I see labs.  Again discussed, this time with son present, the importance of sodium restriction.

## 2016-09-18 NOTE — Patient Instructions (Signed)
I am delighted that you are here and that you signed up for Medicare Part D and that Matthew [name] is with you.  I hope that is a sign that you are going to take better care of yourself.    I will call tomorrow with labs and maybe adjust meds.  Remember salt/sodium is poison.  Weight loss and a better diet is crucial.

## 2016-09-18 NOTE — Assessment & Plan Note (Signed)
Surprising good control.  Right foot ulcer is worrisome.

## 2016-09-18 NOTE — Progress Notes (Signed)
   Subjective:    Patient ID: Matthew [name], male    DOB: 1956-08-24, 60 y.o.   MRN: [id number]  HPI  Comes in with son.  Issues: 1. Cirrhosis, now taking meds.  Has signed up for Medicare Part D for next year.  Wt still up.  Likely due to dietary indiscretion. 2. Morbid obesity.  Unclear dry wt.  Was likely 350 one year ago.  Now?  Likely has gained both fluid weight and adipose.   3. Possible cellulitis right leg.  Has skin breakdown on shin and foot.  No fever or systemic symptoms.  Concern for redness and warmth and pain of right leg. 4. Depression: Does not admit to much with son in the room.  I continue to be struck by the discrepancy of saying he wants to get better but behaviors not matching. 5. DM A1C is good control   Review of Systems     Objective:   Physical Exam Wt and VS noted. Lungs clear Abd enormous pannus. Ext left leg chronic venous stasis changes. Rt leg, more erythema and warmth. Also multiple scabbed areas that could easily serve as an entrance wound for bacteria. Feet - diabetic foot exam done.  Very bad callous/ulcer lateral aspect of midfoot, plantar surface.  Callous debrided.        Assessment & Plan:

## 2016-09-19 ENCOUNTER — Other Ambulatory Visit: Payer: Self-pay

## 2016-09-19 DIAGNOSIS — E876 Hypokalemia: Secondary | ICD-10-CM | POA: Insufficient documentation

## 2016-09-19 LAB — COMPLETE METABOLIC PANEL WITH GFR
ALBUMIN: 2.4 g/dL — AB (ref 3.6–5.1)
ALK PHOS: 198 U/L — AB (ref 40–115)
ALT: 17 U/L (ref 9–46)
AST: 31 U/L (ref 10–35)
BILIRUBIN TOTAL: 1.4 mg/dL — AB (ref 0.2–1.2)
BUN: 18 mg/dL (ref 7–25)
CALCIUM: 8.6 mg/dL (ref 8.6–10.3)
CHLORIDE: 98 mmol/L (ref 98–110)
CO2: 25 mmol/L (ref 20–31)
CREATININE: 1.52 mg/dL — AB (ref 0.70–1.33)
GFR, EST AFRICAN AMERICAN: 57 mL/min — AB (ref >=60)
GFR, Est Non African American: 49 mL/min — ABNORMAL LOW (ref >=60)
Glucose, Bld: 107 mg/dL — ABNORMAL HIGH (ref 65–99)
Potassium: 3.1 mmol/L — ABNORMAL LOW (ref 3.5–5.3)
Sodium: 137 mmol/L (ref 135–146)
TOTAL PROTEIN: 7.1 g/dL (ref 6.1–8.1)

## 2016-09-19 LAB — PROTIME-INR
INR: 1.3 — ABNORMAL HIGH
PROTHROMBIN TIME: 13.8 s — AB (ref 9.0–11.5)

## 2016-09-19 MED ORDER — POTASSIUM CHLORIDE CRYS ER 20 MEQ PO TBCR: 20.0000 meq | EXTENDED_RELEASE_TABLET | Freq: Every day | ORAL | 12 refills | Status: AC

## 2016-09-19 MED FILL — KLOR-CON M20 TABLET: 20 | 30 days supply | Qty: 30 | Fill #0

## 2016-09-19 NOTE — Patient Outreach (Signed)
I received a message from Mr. Matthew [name] in regards to his antibiotic that he was prescribed yesterday.  He stated it was not ready when he went to go pick it up and he did not wait for it.  He is not able to pick it up and was wondering if we could help him out. I called Mr. Mccully back and he stated that his son and one of his son's friends were on the way to go pick it up.  I stated that we would pay for it since it was supposed to be with the medications he picked up yesterday.  He stated he appreciated it.  He has signed up for a Part D plan and it should be effective either January or February 2018.  He will be followed by Bennye Alm, PharmD.    Deanne Coffer, PharmD, [employer] [phone]

## 2016-09-19 NOTE — Addendum Note (Signed)
Addended by: Zenia Resides on: 09/19/2016 12:10 PM   Modules accepted: Orders

## 2016-09-23 ENCOUNTER — Other Ambulatory Visit: Payer: Self-pay

## 2016-09-23 NOTE — Patient Outreach (Signed)
I received a call from Mr. Seward in regards to assisting him with getting a cortisone shot.  I stated that [facility] does not give cortisone shots.  I recommended that he call his primary care doctor and discuss his needs for one.  He stated he was not able to walk for the last couple of days due to his hip.  He did tell me the reason for the pain or how the injury occurred.  He did state it was an old injury.  He stated if we were not able to give the injection why were we involved.  All we did was paperwork.  I explained [facility]'s services to him again.  I reminded him that we were involved to help him gain knowledge about his chronic diseases and how to manage them better.  I also explained that we have assisted him in purchasing his medications multiple times in the last couple of weeks since he was not able to afford them.  I stated we are here to help him become healthier and manage his diseases better through his diet, exercise and medications.  I stated we are not here to do everything for him.  We are not an acute service like home health.  We do not do physical therapy or OT.  I explained that we are trying to assist him in becoming a better avocate for himself.  He stated he appreciated all we have done for him and stated he would call his primary care office.  I have called Loni Muse, RN Care Coordinator and updated her on the call.   Deanne Coffer, PharmD, [employer] [phone]

## 2016-09-24 ENCOUNTER — Telehealth: Payer: Self-pay | Admitting: Family Medicine

## 2016-09-24 MED ORDER — COLCHICINE 0.6 MG PO TABS: ORAL_TABLET | ORAL | 0 refills | Status: AC

## 2016-09-24 MED FILL — COLCHICINE 0.6 MG TABLET: 0.6 | 7 days supply | Qty: 14 | Fill #0

## 2016-09-24 NOTE — Telephone Encounter (Signed)
Pt called because he is in pain and very sick. He said that he needs something since he can not walk to the bathroom because of the pain. He can not get into a car or doing anything. He would like to speak to the person covering for Dr. Andria Frames. jw

## 2016-09-24 NOTE — Patient Outreach (Signed)
[place] [facility]) Care Management  09/24/2016  Matthew [name] January 07, 1956 [id number]  60 year old male referred to [place] for medication assistance for assistance with choosing a Medicare Part D plan. Called patient and scheduled home visit to discuss medication assistance for tomorrow.  Will discuss Medicare Part D plans and likely assist patient with low income subsidy/extra help application.  Bennye Alm, PharmD, [place] PGY2 Pharmacy Resident [phone]

## 2016-09-24 NOTE — Telephone Encounter (Signed)
[phone] Matthew [name] (M) RIGHT hip pain Acute weaight bearing is painful Never had before O/w feeling pretty good I had him tdo a "log roll" at home and he had pain with that so may be intra-articular--DJD---could be hip effusion (hx cisrrhosis) cr up a bit last at 1.58. Will try colchicine as an antiinflammatory--he says maybe [facility] can help him w cost. If not, he will let me kow. Alternatively could use some vicodin to decrease pain so we could get him here for exam and or get X ray---given his BMI occult fx is also possibility

## 2016-09-25 ENCOUNTER — Inpatient Hospital Stay (HOSPITAL_COMMUNITY)
Admission: EM | Admit: 2016-09-25 | Discharge: 2016-10-30 | DRG: 682 | Disposition: E | Payer: Medicare Other | Attending: Emergency Medicine | Admitting: Emergency Medicine

## 2016-09-25 ENCOUNTER — Other Ambulatory Visit: Payer: Self-pay

## 2016-09-25 DIAGNOSIS — Z9081 Acquired absence of spleen: Secondary | ICD-10-CM

## 2016-09-25 DIAGNOSIS — N179 Acute kidney failure, unspecified: Secondary | ICD-10-CM | POA: Diagnosis not present

## 2016-09-25 DIAGNOSIS — N390 Urinary tract infection, site not specified: Secondary | ICD-10-CM | POA: Diagnosis present

## 2016-09-25 DIAGNOSIS — G9341 Metabolic encephalopathy: Secondary | ICD-10-CM | POA: Diagnosis not present

## 2016-09-25 DIAGNOSIS — R339 Retention of urine, unspecified: Secondary | ICD-10-CM | POA: Diagnosis present

## 2016-09-25 DIAGNOSIS — G9340 Encephalopathy, unspecified: Secondary | ICD-10-CM

## 2016-09-25 DIAGNOSIS — I872 Venous insufficiency (chronic) (peripheral): Secondary | ICD-10-CM | POA: Diagnosis present

## 2016-09-25 DIAGNOSIS — I509 Heart failure, unspecified: Secondary | ICD-10-CM | POA: Diagnosis present

## 2016-09-25 DIAGNOSIS — Z880 Allergy status to penicillin: Secondary | ICD-10-CM

## 2016-09-25 DIAGNOSIS — E876 Hypokalemia: Secondary | ICD-10-CM | POA: Diagnosis present

## 2016-09-25 DIAGNOSIS — I482 Chronic atrial fibrillation: Secondary | ICD-10-CM | POA: Diagnosis present

## 2016-09-25 DIAGNOSIS — E1142 Type 2 diabetes mellitus with diabetic polyneuropathy: Secondary | ICD-10-CM | POA: Diagnosis present

## 2016-09-25 DIAGNOSIS — N182 Chronic kidney disease, stage 2 (mild): Secondary | ICD-10-CM | POA: Diagnosis present

## 2016-09-25 DIAGNOSIS — I469 Cardiac arrest, cause unspecified: Secondary | ICD-10-CM | POA: Diagnosis not present

## 2016-09-25 DIAGNOSIS — Z978 Presence of other specified devices: Secondary | ICD-10-CM

## 2016-09-25 DIAGNOSIS — D899 Disorder involving the immune mechanism, unspecified: Secondary | ICD-10-CM | POA: Diagnosis present

## 2016-09-25 DIAGNOSIS — E871 Hypo-osmolality and hyponatremia: Secondary | ICD-10-CM | POA: Diagnosis present

## 2016-09-25 DIAGNOSIS — I878 Other specified disorders of veins: Secondary | ICD-10-CM | POA: Diagnosis present

## 2016-09-25 DIAGNOSIS — Z888 Allergy status to other drugs, medicaments and biological substances status: Secondary | ICD-10-CM

## 2016-09-25 DIAGNOSIS — K219 Gastro-esophageal reflux disease without esophagitis: Secondary | ICD-10-CM | POA: Diagnosis present

## 2016-09-25 DIAGNOSIS — K7030 Alcoholic cirrhosis of liver without ascites: Secondary | ICD-10-CM | POA: Diagnosis present

## 2016-09-25 DIAGNOSIS — Z452 Encounter for adjustment and management of vascular access device: Secondary | ICD-10-CM

## 2016-09-25 DIAGNOSIS — Z87891 Personal history of nicotine dependence: Secondary | ICD-10-CM

## 2016-09-25 DIAGNOSIS — R2689 Other abnormalities of gait and mobility: Secondary | ICD-10-CM

## 2016-09-25 DIAGNOSIS — E2740 Unspecified adrenocortical insufficiency: Secondary | ICD-10-CM | POA: Diagnosis present

## 2016-09-25 DIAGNOSIS — E872 Acidosis: Secondary | ICD-10-CM | POA: Diagnosis present

## 2016-09-25 DIAGNOSIS — Z4659 Encounter for fitting and adjustment of other gastrointestinal appliance and device: Secondary | ICD-10-CM

## 2016-09-25 DIAGNOSIS — Z7982 Long term (current) use of aspirin: Secondary | ICD-10-CM

## 2016-09-25 DIAGNOSIS — Z515 Encounter for palliative care: Secondary | ICD-10-CM | POA: Diagnosis present

## 2016-09-25 DIAGNOSIS — L03115 Cellulitis of right lower limb: Secondary | ICD-10-CM | POA: Diagnosis present

## 2016-09-25 DIAGNOSIS — M25551 Pain in right hip: Secondary | ICD-10-CM | POA: Diagnosis present

## 2016-09-25 DIAGNOSIS — Z6841 Body Mass Index (BMI) 40.0 and over, adult: Secondary | ICD-10-CM

## 2016-09-25 DIAGNOSIS — B952 Enterococcus as the cause of diseases classified elsewhere: Secondary | ICD-10-CM | POA: Diagnosis present

## 2016-09-25 DIAGNOSIS — Z8249 Family history of ischemic heart disease and other diseases of the circulatory system: Secondary | ICD-10-CM

## 2016-09-25 DIAGNOSIS — J80 Acute respiratory distress syndrome: Secondary | ICD-10-CM | POA: Diagnosis not present

## 2016-09-25 DIAGNOSIS — E875 Hyperkalemia: Secondary | ICD-10-CM | POA: Diagnosis not present

## 2016-09-25 DIAGNOSIS — F331 Major depressive disorder, recurrent, moderate: Secondary | ICD-10-CM | POA: Diagnosis present

## 2016-09-25 DIAGNOSIS — N5089 Other specified disorders of the male genital organs: Secondary | ICD-10-CM | POA: Diagnosis present

## 2016-09-25 DIAGNOSIS — I480 Paroxysmal atrial fibrillation: Secondary | ICD-10-CM | POA: Diagnosis present

## 2016-09-25 DIAGNOSIS — A419 Sepsis, unspecified organism: Secondary | ICD-10-CM | POA: Diagnosis not present

## 2016-09-25 DIAGNOSIS — W19XXXA Unspecified fall, initial encounter: Secondary | ICD-10-CM | POA: Diagnosis present

## 2016-09-25 DIAGNOSIS — L03116 Cellulitis of left lower limb: Secondary | ICD-10-CM

## 2016-09-25 DIAGNOSIS — Z79899 Other long term (current) drug therapy: Secondary | ICD-10-CM

## 2016-09-25 DIAGNOSIS — Z66 Do not resuscitate: Secondary | ICD-10-CM | POA: Diagnosis present

## 2016-09-25 DIAGNOSIS — K7040 Alcoholic hepatic failure without coma: Secondary | ICD-10-CM | POA: Diagnosis present

## 2016-09-25 DIAGNOSIS — R6521 Severe sepsis with septic shock: Secondary | ICD-10-CM | POA: Diagnosis not present

## 2016-09-25 DIAGNOSIS — E1122 Type 2 diabetes mellitus with diabetic chronic kidney disease: Secondary | ICD-10-CM | POA: Diagnosis present

## 2016-09-25 DIAGNOSIS — E860 Dehydration: Secondary | ICD-10-CM | POA: Diagnosis present

## 2016-09-25 DIAGNOSIS — E662 Morbid (severe) obesity with alveolar hypoventilation: Secondary | ICD-10-CM | POA: Diagnosis present

## 2016-09-25 DIAGNOSIS — N19 Unspecified kidney failure: Secondary | ICD-10-CM

## 2016-09-25 DIAGNOSIS — I130 Hypertensive heart and chronic kidney disease with heart failure and stage 1 through stage 4 chronic kidney disease, or unspecified chronic kidney disease: Secondary | ICD-10-CM | POA: Diagnosis present

## 2016-09-25 DIAGNOSIS — J189 Pneumonia, unspecified organism: Secondary | ICD-10-CM | POA: Diagnosis present

## 2016-09-25 DIAGNOSIS — J9690 Respiratory failure, unspecified, unspecified whether with hypoxia or hypercapnia: Secondary | ICD-10-CM | POA: Diagnosis present

## 2016-09-25 DIAGNOSIS — N359 Urethral stricture, unspecified: Secondary | ICD-10-CM | POA: Diagnosis present

## 2016-09-25 DIAGNOSIS — Z781 Physical restraint status: Secondary | ICD-10-CM

## 2016-09-25 DIAGNOSIS — R0603 Acute respiratory distress: Secondary | ICD-10-CM

## 2016-09-25 NOTE — ED Notes (Signed)
Bed: WA12 Expected date:  Expected time:  Means of arrival:  Comments: Ems 

## 2016-09-26 ENCOUNTER — Encounter (HOSPITAL_COMMUNITY): Payer: Self-pay | Admitting: Emergency Medicine

## 2016-09-26 ENCOUNTER — Observation Stay (HOSPITAL_COMMUNITY): Payer: Medicare Other

## 2016-09-26 ENCOUNTER — Emergency Department (HOSPITAL_COMMUNITY): Payer: Medicare Other

## 2016-09-26 DIAGNOSIS — J9601 Acute respiratory failure with hypoxia: Secondary | ICD-10-CM | POA: Diagnosis not present

## 2016-09-26 DIAGNOSIS — N390 Urinary tract infection, site not specified: Secondary | ICD-10-CM | POA: Diagnosis present

## 2016-09-26 DIAGNOSIS — G9341 Metabolic encephalopathy: Secondary | ICD-10-CM | POA: Diagnosis not present

## 2016-09-26 DIAGNOSIS — J9600 Acute respiratory failure, unspecified whether with hypoxia or hypercapnia: Secondary | ICD-10-CM | POA: Diagnosis not present

## 2016-09-26 DIAGNOSIS — I469 Cardiac arrest, cause unspecified: Secondary | ICD-10-CM | POA: Diagnosis not present

## 2016-09-26 DIAGNOSIS — K7030 Alcoholic cirrhosis of liver without ascites: Secondary | ICD-10-CM | POA: Diagnosis present

## 2016-09-26 DIAGNOSIS — E872 Acidosis: Secondary | ICD-10-CM | POA: Diagnosis present

## 2016-09-26 DIAGNOSIS — N170 Acute kidney failure with tubular necrosis: Secondary | ICD-10-CM | POA: Diagnosis not present

## 2016-09-26 DIAGNOSIS — N179 Acute kidney failure, unspecified: Secondary | ICD-10-CM | POA: Diagnosis present

## 2016-09-26 DIAGNOSIS — M25551 Pain in right hip: Secondary | ICD-10-CM | POA: Diagnosis not present

## 2016-09-26 DIAGNOSIS — E1142 Type 2 diabetes mellitus with diabetic polyneuropathy: Secondary | ICD-10-CM | POA: Diagnosis present

## 2016-09-26 DIAGNOSIS — E871 Hypo-osmolality and hyponatremia: Secondary | ICD-10-CM | POA: Diagnosis present

## 2016-09-26 DIAGNOSIS — J9622 Acute and chronic respiratory failure with hypercapnia: Secondary | ICD-10-CM | POA: Diagnosis not present

## 2016-09-26 DIAGNOSIS — R6521 Severe sepsis with septic shock: Secondary | ICD-10-CM | POA: Diagnosis not present

## 2016-09-26 DIAGNOSIS — D899 Disorder involving the immune mechanism, unspecified: Secondary | ICD-10-CM | POA: Diagnosis present

## 2016-09-26 DIAGNOSIS — R2689 Other abnormalities of gait and mobility: Secondary | ICD-10-CM | POA: Diagnosis not present

## 2016-09-26 DIAGNOSIS — I509 Heart failure, unspecified: Secondary | ICD-10-CM | POA: Diagnosis present

## 2016-09-26 DIAGNOSIS — L03115 Cellulitis of right lower limb: Secondary | ICD-10-CM | POA: Diagnosis present

## 2016-09-26 DIAGNOSIS — L03116 Cellulitis of left lower limb: Secondary | ICD-10-CM | POA: Diagnosis not present

## 2016-09-26 DIAGNOSIS — E662 Morbid (severe) obesity with alveolar hypoventilation: Secondary | ICD-10-CM | POA: Diagnosis present

## 2016-09-26 DIAGNOSIS — W19XXXA Unspecified fall, initial encounter: Secondary | ICD-10-CM | POA: Diagnosis present

## 2016-09-26 DIAGNOSIS — Z6841 Body Mass Index (BMI) 40.0 and over, adult: Secondary | ICD-10-CM | POA: Diagnosis not present

## 2016-09-26 DIAGNOSIS — I482 Chronic atrial fibrillation: Secondary | ICD-10-CM | POA: Diagnosis present

## 2016-09-26 DIAGNOSIS — G9340 Encephalopathy, unspecified: Secondary | ICD-10-CM | POA: Diagnosis not present

## 2016-09-26 DIAGNOSIS — I872 Venous insufficiency (chronic) (peripheral): Secondary | ICD-10-CM | POA: Diagnosis present

## 2016-09-26 DIAGNOSIS — M79609 Pain in unspecified limb: Secondary | ICD-10-CM | POA: Diagnosis not present

## 2016-09-26 DIAGNOSIS — E2740 Unspecified adrenocortical insufficiency: Secondary | ICD-10-CM | POA: Diagnosis present

## 2016-09-26 DIAGNOSIS — J80 Acute respiratory distress syndrome: Secondary | ICD-10-CM | POA: Diagnosis not present

## 2016-09-26 DIAGNOSIS — I480 Paroxysmal atrial fibrillation: Secondary | ICD-10-CM | POA: Diagnosis present

## 2016-09-26 DIAGNOSIS — J189 Pneumonia, unspecified organism: Secondary | ICD-10-CM | POA: Diagnosis present

## 2016-09-26 DIAGNOSIS — E1122 Type 2 diabetes mellitus with diabetic chronic kidney disease: Secondary | ICD-10-CM | POA: Diagnosis present

## 2016-09-26 DIAGNOSIS — K7040 Alcoholic hepatic failure without coma: Secondary | ICD-10-CM | POA: Diagnosis present

## 2016-09-26 DIAGNOSIS — Z452 Encounter for adjustment and management of vascular access device: Secondary | ICD-10-CM | POA: Diagnosis not present

## 2016-09-26 DIAGNOSIS — F331 Major depressive disorder, recurrent, moderate: Secondary | ICD-10-CM | POA: Diagnosis present

## 2016-09-26 DIAGNOSIS — A419 Sepsis, unspecified organism: Secondary | ICD-10-CM | POA: Diagnosis not present

## 2016-09-26 DIAGNOSIS — J9621 Acute and chronic respiratory failure with hypoxia: Secondary | ICD-10-CM | POA: Diagnosis not present

## 2016-09-26 DIAGNOSIS — E860 Dehydration: Secondary | ICD-10-CM | POA: Diagnosis present

## 2016-09-26 DIAGNOSIS — I130 Hypertensive heart and chronic kidney disease with heart failure and stage 1 through stage 4 chronic kidney disease, or unspecified chronic kidney disease: Secondary | ICD-10-CM | POA: Diagnosis present

## 2016-09-26 LAB — SEDIMENTATION RATE: SED RATE: 35 mm/h — AB (ref 0–16)

## 2016-09-26 LAB — CBC
HEMATOCRIT: 42.2 % (ref 39.0–52.0)
HEMOGLOBIN: 15 g/dL (ref 13.0–17.0)
MCH: 32.9 pg (ref 26.0–34.0)
MCHC: 35.5 g/dL (ref 30.0–36.0)
MCV: 92.5 fL (ref 78.0–100.0)
Platelets: 417 10*3/uL — ABNORMAL HIGH (ref 150–400)
RBC: 4.56 MIL/uL (ref 4.22–5.81)
RDW: 18.2 % — AB (ref 11.5–15.5)
WBC: 10.2 10*3/uL (ref 4.0–10.5)

## 2016-09-26 LAB — COMPREHENSIVE METABOLIC PANEL
ALBUMIN: 2.5 g/dL — AB (ref 3.5–5.0)
ALT: 21 U/L (ref 17–63)
ANION GAP: 10 (ref 5–15)
AST: 36 U/L (ref 15–41)
Alkaline Phosphatase: 183 U/L — ABNORMAL HIGH (ref 38–126)
BUN: 22 mg/dL — ABNORMAL HIGH (ref 6–20)
CHLORIDE: 98 mmol/L — AB (ref 101–111)
CO2: 26 mmol/L (ref 22–32)
Calcium: 8.4 mg/dL — ABNORMAL LOW (ref 8.9–10.3)
Creatinine, Ser: 2.05 mg/dL — ABNORMAL HIGH (ref 0.61–1.24)
GFR calc Af Amer: 39 mL/min — ABNORMAL LOW (ref >60)
GFR calc non Af Amer: 34 mL/min — ABNORMAL LOW (ref >60)
GLUCOSE: 68 mg/dL (ref 65–99)
POTASSIUM: 3.4 mmol/L — AB (ref 3.5–5.1)
SODIUM: 134 mmol/L — AB (ref 135–145)
TOTAL PROTEIN: 7 g/dL (ref 6.5–8.1)
Total Bilirubin: 1.1 mg/dL (ref 0.3–1.2)

## 2016-09-26 LAB — URINALYSIS, ROUTINE W REFLEX MICROSCOPIC
BILIRUBIN URINE: NEGATIVE
Glucose, UA: NEGATIVE mg/dL
Ketones, ur: NEGATIVE mg/dL
Nitrite: NEGATIVE
PROTEIN: NEGATIVE mg/dL
Specific Gravity, Urine: 1.008 (ref 1.005–1.030)
pH: 6 (ref 5.0–8.0)

## 2016-09-26 LAB — CBC WITH DIFFERENTIAL/PLATELET
BASOS ABS: 0 10*3/uL (ref 0.0–0.1)
Basophils Relative: 0 %
Eosinophils Absolute: 0.1 10*3/uL (ref 0.0–0.7)
Eosinophils Relative: 1 %
HCT: 40.7 % (ref 39.0–52.0)
Hemoglobin: 14.4 g/dL (ref 13.0–17.0)
LYMPHS ABS: 2.9 10*3/uL (ref 0.7–4.0)
Lymphocytes Relative: 22 %
MCH: 32.7 pg (ref 26.0–34.0)
MCHC: 35.4 g/dL (ref 30.0–36.0)
MCV: 92.5 fL (ref 78.0–100.0)
MONO ABS: 1.9 10*3/uL — AB (ref 0.1–1.0)
MONOS PCT: 14 %
NEUTROS PCT: 63 %
Neutro Abs: 8.4 10*3/uL — ABNORMAL HIGH (ref 1.7–7.7)
PLATELETS: 417 10*3/uL — AB (ref 150–400)
RBC: 4.4 MIL/uL (ref 4.22–5.81)
RDW: 18.1 % — AB (ref 11.5–15.5)
WBC: 13.3 10*3/uL — AB (ref 4.0–10.5)

## 2016-09-26 LAB — GLUCOSE, CAPILLARY
Glucose-Capillary: 147 mg/dL — ABNORMAL HIGH (ref 65–99)
Glucose-Capillary: 130 mg/dL — ABNORMAL HIGH (ref 65–99)

## 2016-09-26 LAB — BASIC METABOLIC PANEL
ANION GAP: 9 (ref 5–15)
BUN: 20 mg/dL (ref 6–20)
CALCIUM: 8.8 mg/dL — AB (ref 8.9–10.3)
CHLORIDE: 96 mmol/L — AB (ref 101–111)
CO2: 25 mmol/L (ref 22–32)
Creatinine, Ser: 2.18 mg/dL — ABNORMAL HIGH (ref 0.61–1.24)
GFR calc non Af Amer: 31 mL/min — ABNORMAL LOW (ref >60)
GFR, EST AFRICAN AMERICAN: 36 mL/min — AB (ref >60)
GLUCOSE: 141 mg/dL — AB (ref 65–99)
POTASSIUM: 3.7 mmol/L (ref 3.5–5.1)
Sodium: 130 mmol/L — ABNORMAL LOW (ref 135–145)

## 2016-09-26 LAB — APTT: aPTT: 35 seconds (ref 24–36)

## 2016-09-26 LAB — CK: Total CK: 163 U/L (ref 49–397)

## 2016-09-26 LAB — MAGNESIUM: Magnesium: 2.2 mg/dL (ref 1.7–2.4)

## 2016-09-26 LAB — PROTIME-INR
INR: 1.39
PROTHROMBIN TIME: 17.2 s — AB (ref 11.4–15.2)

## 2016-09-26 LAB — C-REACTIVE PROTEIN: CRP: 2.7 mg/dL — AB (ref <1.0)

## 2016-09-26 MED ORDER — GABAPENTIN 300 MG PO CAPS
300.0000 mg | ORAL_CAPSULE | Freq: Three times a day (TID) | ORAL | Status: DC
  Administered 2016-09-26 – 2016-09-27 (×5): 300 mg via ORAL
  Filled 2016-09-26 (×5): qty 1

## 2016-09-26 MED ORDER — LACTULOSE 10 GM/15ML PO SOLN
30.0000 g | Freq: Two times a day (BID) | ORAL | Status: DC
  Administered 2016-09-26 – 2016-09-27 (×2): 30 g via ORAL
  Filled 2016-09-26 (×4): qty 45

## 2016-09-26 MED ORDER — HYDROMORPHONE HCL 2 MG/ML IJ SOLN
0.5000 mg | Freq: Once | INTRAMUSCULAR | Status: AC
  Administered 2016-09-26: 0.5 mg via INTRAVENOUS
  Filled 2016-09-26: qty 1

## 2016-09-26 MED ORDER — ACETAMINOPHEN 650 MG RE SUPP: 650.0000 mg | Freq: Four times a day (QID) | RECTAL | Status: DC | PRN

## 2016-09-26 MED ORDER — SODIUM CHLORIDE 0.9 % IV BOLUS (SEPSIS)
500.0000 mL | Freq: Once | INTRAVENOUS | Status: AC
  Administered 2016-09-26: 500 mL via INTRAVENOUS

## 2016-09-26 MED ORDER — POLYETHYLENE GLYCOL 3350 17 G PO PACK: 17.0000 g | PACK | Freq: Every day | ORAL | Status: DC | PRN

## 2016-09-26 MED ORDER — METHOCARBAMOL 1000 MG/10ML IJ SOLN: 1000.0000 mg | Freq: Once | INTRAMUSCULAR | Status: DC

## 2016-09-26 MED ORDER — SPIRONOLACTONE 25 MG PO TABS
50.0000 mg | ORAL_TABLET | Freq: Two times a day (BID) | ORAL | Status: DC
  Administered 2016-09-26 – 2016-09-27 (×4): 50 mg via ORAL
  Filled 2016-09-26 (×4): qty 1

## 2016-09-26 MED ORDER — INSULIN ASPART 100 UNIT/ML ~~LOC~~ SOLN
0.0000 [IU] | Freq: Three times a day (TID) | SUBCUTANEOUS | Status: DC
  Administered 2016-09-26: 1 [IU] via SUBCUTANEOUS

## 2016-09-26 MED ORDER — ENOXAPARIN SODIUM 40 MG/0.4ML ~~LOC~~ SOLN
40.0000 mg | SUBCUTANEOUS | Status: DC
  Administered 2016-09-26: 40 mg via SUBCUTANEOUS
  Filled 2016-09-26 (×2): qty 0.4

## 2016-09-26 MED ORDER — MORPHINE SULFATE (PF) 4 MG/ML IV SOLN
4.0000 mg | INTRAVENOUS | Status: DC | PRN
  Administered 2016-09-26: 4 mg via INTRAVENOUS
  Filled 2016-09-26: qty 1

## 2016-09-26 MED ORDER — DILTIAZEM HCL ER COATED BEADS 180 MG PO CP24
180.0000 mg | ORAL_CAPSULE | Freq: Every day | ORAL | Status: DC
  Administered 2016-09-26 – 2016-09-27 (×2): 180 mg via ORAL
  Filled 2016-09-26 (×3): qty 1

## 2016-09-26 MED ORDER — DEXTROSE 5 % IV SOLN
1.0000 g | Freq: Once | INTRAVENOUS | Status: AC
  Administered 2016-09-26: 1 g via INTRAVENOUS
  Filled 2016-09-26: qty 10

## 2016-09-26 MED ORDER — PIPERACILLIN-TAZOBACTAM 3.375 G IVPB
3.3750 g | Freq: Three times a day (TID) | INTRAVENOUS | Status: DC
  Administered 2016-09-26 – 2016-10-02 (×17): 3.375 g via INTRAVENOUS
  Filled 2016-09-26 (×21): qty 50

## 2016-09-26 MED ORDER — METHOCARBAMOL 1000 MG/10ML IJ SOLN
1000.0000 mg | Freq: Once | INTRAVENOUS | Status: AC
  Administered 2016-09-26: 1000 mg via INTRAVENOUS
  Filled 2016-09-26: qty 10

## 2016-09-26 MED ORDER — ACETAMINOPHEN 325 MG PO TABS: 650.0000 mg | ORAL_TABLET | Freq: Four times a day (QID) | ORAL | Status: DC | PRN

## 2016-09-26 MED ORDER — SODIUM CHLORIDE 0.9 % IV BOLUS (SEPSIS)
1000.0000 mL | Freq: Once | INTRAVENOUS | Status: AC
  Administered 2016-09-26: 1000 mL via INTRAVENOUS

## 2016-09-26 MED ORDER — ONDANSETRON HCL 4 MG/2ML IJ SOLN: 4.0000 mg | Freq: Three times a day (TID) | INTRAMUSCULAR | Status: DC | PRN

## 2016-09-26 MED ORDER — ASPIRIN 81 MG PO CHEW
81.0000 mg | CHEWABLE_TABLET | Freq: Every day | ORAL | Status: DC
  Administered 2016-09-26 – 2016-09-27 (×2): 81 mg via ORAL
  Filled 2016-09-26 (×2): qty 1

## 2016-09-26 MED ORDER — POTASSIUM CHLORIDE CRYS ER 20 MEQ PO TBCR
20.0000 meq | EXTENDED_RELEASE_TABLET | Freq: Every day | ORAL | Status: DC
  Administered 2016-09-26 – 2016-09-27 (×2): 20 meq via ORAL
  Filled 2016-09-26 (×2): qty 1

## 2016-09-26 MED ORDER — NYSTATIN 100000 UNIT/GM EX POWD
Freq: Three times a day (TID) | CUTANEOUS | Status: DC
  Administered 2016-09-26 – 2016-10-03 (×20): via TOPICAL
  Filled 2016-09-26 (×2): qty 15

## 2016-09-26 MED ORDER — SODIUM CHLORIDE 0.9 % IV SOLN
INTRAVENOUS | Status: DC
  Administered 2016-09-26 – 2016-09-28 (×4): via INTRAVENOUS

## 2016-09-26 MED ORDER — ONDANSETRON HCL 4 MG/2ML IJ SOLN
4.0000 mg | Freq: Once | INTRAMUSCULAR | Status: AC
  Administered 2016-09-26: 4 mg via INTRAVENOUS
  Filled 2016-09-26: qty 2

## 2016-09-26 MED ORDER — TRAMADOL HCL 50 MG PO TABS
50.0000 mg | ORAL_TABLET | Freq: Four times a day (QID) | ORAL | Status: DC | PRN
  Administered 2016-09-27 (×2): 100 mg via ORAL
  Filled 2016-09-26 (×3): qty 2

## 2016-09-26 MED ORDER — ONDANSETRON HCL 4 MG/2ML IJ SOLN
4.0000 mg | Freq: Four times a day (QID) | INTRAMUSCULAR | Status: DC | PRN
  Administered 2016-09-27: 4 mg via INTRAVENOUS
  Filled 2016-09-26: qty 2

## 2016-09-26 MED ORDER — HYDROCODONE-ACETAMINOPHEN 5-325 MG PO TABS
2.0000 | ORAL_TABLET | Freq: Once | ORAL | Status: AC
  Administered 2016-09-26: 2 via ORAL
  Filled 2016-09-26: qty 2

## 2016-09-26 MED ORDER — ONDANSETRON HCL 4 MG PO TABS: 4.0000 mg | ORAL_TABLET | Freq: Four times a day (QID) | ORAL | Status: DC | PRN

## 2016-09-26 NOTE — ED Notes (Signed)
Pt aware he needs to give urine but is unable to at this time.  MD aware.
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
## 2016-09-26 NOTE — ED Notes (Signed)
Admitting MD at bedside.  Pt was given water and repositioned to sitting at the bedside.

## 2016-09-26 NOTE — ED Provider Notes (Signed)
9:27 AM Received patient is a transfer from [facility] long hospital. Patient initially presented to [place] along with complaint of right hip pain. Patient apparently fell several weeks ago and has developed worsening pain 5 days ago. He is normally ambulatory, but states he has not been able to walk in the last several days. After evaluation a [facility], patient was found to have a urinary tract infection with too numerous to count white blood cells in his urine and many bacteria. He was also found to have worsening renal function, with creatinine up to 2.05 from baseline of 1.5. He was hypotensive initially, but that improved with a bolus of IV fluids.  Patient is currently complaining of chest pain to the right hip, states it is 5 out of 10. Patient on exam appears to be in no acute distress. Morbidly obese. His tenderness to the right hip, pain with range of motion of his right leg at the hip joint. Patient does have erythema, induration, tenderness over the lower abdomen pannus. Question possible cellulitis. Lungs are clear. Regular heart rate and rhythm.  I discussed patient with family practice, asked to place holding orders. They will admit.  Vitals:   09/26/16 0657 09/26/16 0823 09/26/16 0912 09/26/16 0913  BP: 115/66 106/66 121/68   Pulse: 65 70 73   Resp: 20 18 18    Temp: 97.6 F (36.4 C) 97.6 F (36.4 C) 97.6 F (36.4 C)   TempSrc: Oral Oral Oral   SpO2: 93% 94% (!) 88% 91%      Jeannett Senior, PA-C 09/26/16 0930    Margette Fast, MD 09/26/16 [phone]

## 2016-09-26 NOTE — Progress Notes (Addendum)
[place] ED at 8:30AM to enquire about patient transfer and patient was not en-route at that point.  Discussed with RN that we would not be accepting patient because they have exceeded our 2-hour transfer policy.  Patient was transferred to [facility] ED anyway and we were not notified of this plan.

## 2016-09-26 NOTE — Progress Notes (Signed)
Family Medicine Teaching Service Transfer Acceptance Note  Brief summary of reason for admission/transfer: UTI Time accepted for transfer: 615AM ED provider with whom patient discussed: Charlann Lange  Please note that patients transferring to [facility] under the [facility] Medicine Teaching Service must arrive at [facility] within two hours of acceptance for transfer.  Eloise Levels, MD PGY-1, Family Medicine Teaching Service Service Pager [phone]
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
## 2016-09-26 NOTE — ED Notes (Signed)
Pt assisted with urinal

## 2016-09-26 NOTE — ED Provider Notes (Signed)
[facility] DEPT Provider Note   CSN: [id number] Arrival date & time: 09/02/2016  2357 By signing my name below, I, Dyke Brackett, attest that this documentation has been prepared under the direction and in the presence of non-physician practitioner, Nehemiah Settle A Nolin Grell PA-C. Electronically Signed: Dyke Brackett, Scribe. 09/26/2016. 12:49 AM.   History   Chief Complaint Chief Complaint  Patient presents with  . Fall   HPI Matthew [name] is a 60 y.o. male with a hx of A-fib, CHF, HTN, septic shock, alcoholic cirrhosis, and DM who presents to the Emergency Department complaining of persistent, progressively worsening right hip pain that radiates to his back onset two weeks ago s/p fallPer pt, he fell two weeks ago and has hand intermittent pain to the area since this fall, but it worsened and became constant five days ago.  Pain is exacerbated by movement, turning and exertion. He called his PCP two days ago and was given a prescription for colchicine that he has taken with no relief .Pt is on aspirin, but denies the use of any blood thinners. He stopped taking his blood pressure medication one month ago "because I don't have any money" but was able to restart this 2 weeks ago. Pt denies any urinary symptoms or abdominal pain.   The history is provided by the patient. No language interpreter was used.   Past Medical History:  Diagnosis Date  . Atrial fibrillation, chronic ([place])   . CHF (congestive heart failure) ([place])   . GERD (gastroesophageal reflux disease)   . Hypertension   . Septic shock ([place])   . Shortness of breath dyspnea     Patient Active Problem List   Diagnosis Date Noted  . Hypokalemia 09/19/2016  . Chronic venous insufficiency   . Neuropathic pain of both feet ([place]) 05/02/2016  . Cellulitis of leg, right 01/17/2016  . GERD (gastroesophageal reflux disease) 09/27/2015  . Rotator cuff (capsule) sprain 09/15/2015  . Diabetic ulcer of right foot associated with type 2  diabetes mellitus ([place])   . Paroxysmal atrial fibrillation (HCC)   . AKI (acute kidney injury) ([place])   . Depression, major, recurrent, moderate ([place]) 03/21/2015  . Alcoholic cirrhosis of liver with ascites ([place])   . Essential hypertension   . Pannus, abdominal 02/23/2014  . 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 Poor social situation 11/11/2013  . Alcohol abuse, in remission 11/03/2013  . Type 2 diabetes mellitus with skin complication, without long-term current use of insulin ([place]) 03/25/2012  . Morbid obesity due to excess calories ([place]) 03/25/2012  . OSA (obstructive sleep apnea) 03/25/2012    Past Surgical History:  Procedure Laterality Date  . HERNIA REPAIR    . KNEE ARTHROSCOPY    . SPLENECTOMY    . TEE WITHOUT CARDIOVERSION N/A 05/21/2016   Procedure: TRANSESOPHAGEAL ECHOCARDIOGRAM (TEE);  Surgeon: Larey Dresser, MD;  Location: [facility];  Service: Cardiovascular;  Laterality: N/A;  . TONSILECTOMY, ADENOIDECTOMY, BILATERAL MYRINGOTOMY AND TUBES        Home Medications    Prior to Admission medications   Medication Sig Start Date End Date Taking? Authorizing Provider  aspirin EC 81 MG EC tablet Take 1 tablet (81 mg total) by mouth daily. 05/27/16   Sela Hilding, MD  CARTIA XT 180 MG 24 hr capsule TAKE 1 CAPSULE BY MOUTH DAILY. 06/18/16   [place] N Rumley, DO  colchicine 0.6 MG tablet Take one tablet by mouth twice daily for hip pain for next 7 days as directed 09/24/16  Dickie La, MD  gabapentin (NEURONTIN) 300 MG capsule Take 1 capsule (300 mg total) by mouth 3 (three) times daily. [facility] 08/19/16   Zenia Resides, MD  NYSTATIN powder APPLY TOPICALLY 3 (THREE) TIMES DAILY. Patient taking differently: Apply topically 3 (three) times daily as needed.  07/15/16   Zenia Resides, MD  potassium chloride SA (K-DUR,KLOR-CON) 20 MEQ tablet Take 1 tablet (20 mEq total) by mouth daily. 09/19/16   Zenia Resides, MD  spironolactone (ALDACTONE) 100 MG  tablet TAKE 1/2 TABLET BY MOUTH TWICE DAILY 09/17/16   Zenia Resides, MD  sulfamethoxazole-trimethoprim (BACTRIM DS,SEPTRA DS) 800-160 MG tablet Take 1 tablet by mouth 2 (two) times daily. 09/18/16   Zenia Resides, MD  torsemide (DEMADEX) 100 MG tablet TAKE 1 TABLET BY MOUTH EVERY MORNING AND 1/2 TABLET EVERY EVENING 09/17/16   Zenia Resides, MD    Family History No family history on file.  Social History Social History  Substance Use Topics  . Smoking status: Former Smoker    Types: Cigarettes    Quit date: 11/01/1974  . Smokeless tobacco: Never Used     Comment: smoked in high school for about a year  . Alcohol use Yes    Allergies   Potassium-containing compounds; Cymbalta [duloxetine hcl]; Augmentin [amoxicillin-pot clavulanate]; and Neomycin  Review of Systems Review of Systems  Constitutional: Negative for fever.  Gastrointestinal: Negative for abdominal pain.  Endocrine: Negative for polyuria.  Genitourinary: Negative for difficulty urinating, dysuria and hematuria.  Musculoskeletal: Positive for arthralgias, back pain and myalgias.  Skin: Negative for wound.  Allergic/Immunologic: Positive for immunocompromised state.  Neurological: Negative for syncope.  Hematological: Does not bruise/bleed easily.  Psychiatric/Behavioral: Negative for confusion.    Physical Exam Updated Vital Signs BP (!) 94/52 (BP Location: Left Arm)   Pulse 86   Temp 98 F (36.7 C) (Oral)   Resp 18   SpO2 94%   Physical Exam  Constitutional: He is oriented to person, place, and time. He appears well-developed and well-nourished. No distress.  Exam limited by body habitus   HENT:  Head: Normocephalic and atraumatic.  Eyes: Conjunctivae are normal.  Cardiovascular: Normal rate and regular rhythm.   Pulmonary/Chest: Effort normal. He has no wheezes. He has no rales.  Abdominal: He exhibits no distension.  Abdomen is morbidly obese, large pannus without visualized rash, redness or  drainage.   Musculoskeletal:  No reproducible TTP to the hip or lower back; FROM of the RLE, distal pulses intact. Chronic bilateral lower extremity edema.  Neurological: He is alert and oriented to person, place, and time.  Skin: Skin is warm and dry.  Psychiatric: He has a normal mood and affect.  Nursing note and vitals reviewed.   ED Treatments / Results  DIAGNOSTIC STUDIES:  Oxygen Saturation is 94% on RA, low by my interpretation.    COORDINATION OF CARE:  12:43 AM Discussed treatment plan with pt at bedside and pt agreed to plan.   Labs (all labs ordered are listed, but only abnormal results are displayed) Labs Reviewed - No data to display  EKG  EKG Interpretation None       Radiology No results found.  Procedures Procedures (including critical care time)  Medications Ordered in ED Medications - No data to display   Initial Impression / Assessment and Plan / ED Course  I have reviewed the triage vital signs and the nursing notes.  Pertinent labs & imaging results that were available  during my care of the patient were reviewed by me and considered in my medical decision making (see chart for details).  Clinical Course    Patient presents with right hip and right low back pain since fall 2 weeks ago. Negative imaging allowing for limitations of body habitus. He moves right LE with increased pain.   The patient is found to have a UTI with worsening renal function (baseline Cr 1.50, now 2.05). His pain is managed in the ED. Blood pressure initially in the 90's, better with small bolus.   The patient is significantly deconditioned and exhibits poor ability to care for himself. He has a new infection, leukocytosis, worsening kidney function and hypotension now improved. Discussed with [facility], Dr. Rosalyn Gess, who accepts for an ED to ED transfer for family practice admission.   The patient is updated on plan and is agreeable to plan of care.   Final Clinical  Impressions(s) / ED Diagnoses   Final diagnoses:  None   1. UTI 2. AKI  New Prescriptions New Prescriptions   No medications on file  I personally performed the services described in this documentation, which was scribed in my presence. The recorded information has been reviewed and is accurate.      Charlann Lange, PA-C 09/26/16 [place], MD 09/26/16 [phone]

## 2016-09-26 NOTE — ED Notes (Signed)
Pt placed on 2L O2 
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
## 2016-09-26 NOTE — ED Notes (Signed)
Noted pt to have removed all monitoring equipment.

## 2016-09-26 NOTE — ED Notes (Signed)
Admitting at bedside 

## 2016-09-26 NOTE — ED Notes (Signed)
Pt removed cardiac monitoring equipment

## 2016-09-26 NOTE — ED Notes (Signed)
Sprite given to pt per Gabriel(RN)

## 2016-09-26 NOTE — Progress Notes (Signed)
Pharmacy Antibiotic Note  Matthew [name] is a 60 y.o. male admitted on 09/14/2016 with cellulitis, possible enterococcal UTI.   Pharmacy has been consulted for Zosyn dosing.  Plan: Zosyn 3.375 grams iv Q 8 hours - 4 hr infusion Follow up Scr, culutres   Temp (24hrs), Avg:97.8 F (36.6 C), Min:97.6 F (36.4 C), Max:98.2 F (36.8 C)   Recent Labs Lab 09/26/16 0045  WBC 13.3*  CREATININE 2.05*    Estimated Creatinine Clearance: 65.1 mL/min (by C-G formula based on SCr of 2.05 mg/dL (H)).    Allergies  Allergen Reactions  . Potassium-Containing Compounds Other (See Comments)    Reaction:  Stomach pain   . Cymbalta [Duloxetine Hcl]     Strange feeling in brain.  . Augmentin [Amoxicillin-Pot Clavulanate] Nausea Only and Other (See Comments)    Has patient had a PCN reaction causing immediate rash, facial/tongue/throat swelling, SOB or lightheadedness with hypotension: No Has patient had a PCN reaction causing severe rash involving mucus membranes or skin necrosis: No Has patient had a PCN reaction that required hospitalization No Has patient had a PCN reaction occurring within the last 10 years: Yes If all of the above answers are "NO", then may proceed with Cephalosporin use.  [name] Neomycin Rash     Thank you for allowing pharmacy to be a part of this patient's care. Anette Guarneri, PharmD [phone] 09/26/2016 1:37 PM
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
## 2016-09-27 ENCOUNTER — Inpatient Hospital Stay (HOSPITAL_COMMUNITY): Payer: Medicare Other

## 2016-09-27 DIAGNOSIS — I872 Venous insufficiency (chronic) (peripheral): Secondary | ICD-10-CM

## 2016-09-27 DIAGNOSIS — M79609 Pain in unspecified limb: Secondary | ICD-10-CM

## 2016-09-27 DIAGNOSIS — L03116 Cellulitis of left lower limb: Secondary | ICD-10-CM

## 2016-09-27 DIAGNOSIS — N179 Acute kidney failure, unspecified: Principal | ICD-10-CM

## 2016-09-27 DIAGNOSIS — N390 Urinary tract infection, site not specified: Secondary | ICD-10-CM

## 2016-09-27 DIAGNOSIS — M25551 Pain in right hip: Secondary | ICD-10-CM

## 2016-09-27 DIAGNOSIS — R2689 Other abnormalities of gait and mobility: Secondary | ICD-10-CM

## 2016-09-27 LAB — MRSA PCR SCREENING: MRSA by PCR: NEGATIVE

## 2016-09-27 LAB — BASIC METABOLIC PANEL
ANION GAP: 10 (ref 5–15)
BUN: 24 mg/dL — ABNORMAL HIGH (ref 6–20)
CALCIUM: 8.4 mg/dL — AB (ref 8.9–10.3)
CO2: 22 mmol/L (ref 22–32)
Chloride: 98 mmol/L — ABNORMAL LOW (ref 101–111)
Creatinine, Ser: 2.27 mg/dL — ABNORMAL HIGH (ref 0.61–1.24)
GFR, EST AFRICAN AMERICAN: 35 mL/min — AB (ref >60)
GFR, EST NON AFRICAN AMERICAN: 30 mL/min — AB (ref >60)
GLUCOSE: 81 mg/dL (ref 65–99)
Potassium: 3.9 mmol/L (ref 3.5–5.1)
SODIUM: 130 mmol/L — AB (ref 135–145)

## 2016-09-27 LAB — CBC
HCT: 40.6 % (ref 39.0–52.0)
Hemoglobin: 14.2 g/dL (ref 13.0–17.0)
MCH: 32.6 pg (ref 26.0–34.0)
MCHC: 35 g/dL (ref 30.0–36.0)
MCV: 93.3 fL (ref 78.0–100.0)
PLATELETS: 358 10*3/uL (ref 150–400)
RBC: 4.35 MIL/uL (ref 4.22–5.81)
RDW: 17.9 % — AB (ref 11.5–15.5)
WBC: 10.4 10*3/uL (ref 4.0–10.5)

## 2016-09-27 LAB — URIC ACID: Uric Acid, Serum: 10.2 mg/dL — ABNORMAL HIGH (ref 4.4–7.6)

## 2016-09-27 LAB — GLUCOSE, CAPILLARY
GLUCOSE-CAPILLARY: 121 mg/dL — AB (ref 65–99)
Glucose-Capillary: 76 mg/dL (ref 65–99)
Glucose-Capillary: 98 mg/dL (ref 65–99)
Glucose-Capillary: 100 mg/dL — ABNORMAL HIGH (ref 65–99)

## 2016-09-27 LAB — AMMONIA: AMMONIA: 129 umol/L — AB (ref 9–35)

## 2016-09-27 LAB — TSH: TSH: 0.187 u[IU]/mL — AB (ref 0.350–4.500)

## 2016-09-27 MED ORDER — LACTULOSE 10 GM/15ML PO SOLN
30.0000 g | Freq: Three times a day (TID) | ORAL | Status: DC
  Administered 2016-09-27 – 2016-10-01 (×13): 30 g via ORAL
  Filled 2016-09-27 (×13): qty 45

## 2016-09-27 MED ORDER — LORAZEPAM 2 MG/ML IJ SOLN
1.0000 mg | Freq: Once | INTRAMUSCULAR | Status: AC
  Administered 2016-09-28: 1 mg via INTRAVENOUS
  Filled 2016-09-27: qty 1

## 2016-09-27 MED ORDER — PREDNISONE 20 MG PO TABS
40.0000 mg | ORAL_TABLET | Freq: Every day | ORAL | Status: DC
  Administered 2016-09-27: 40 mg via ORAL
  Filled 2016-09-27: qty 2

## 2016-09-27 MED ORDER — ENOXAPARIN SODIUM 100 MG/ML ~~LOC~~ SOLN
90.0000 mg | SUBCUTANEOUS | Status: DC
  Administered 2016-09-27: 90 mg via SUBCUTANEOUS
  Filled 2016-09-27: qty 1

## 2016-09-27 MED ORDER — RIFAXIMIN 550 MG PO TABS
550.0000 mg | ORAL_TABLET | Freq: Two times a day (BID) | ORAL | Status: DC
  Administered 2016-09-27 – 2016-10-03 (×13): 550 mg via ORAL
  Filled 2016-09-27 (×14): qty 1

## 2016-09-27 MED ORDER — SODIUM CHLORIDE 0.9 % IV BOLUS (SEPSIS)
1000.0000 mL | Freq: Once | INTRAVENOUS | Status: AC
  Administered 2016-09-27: 1000 mL via INTRAVENOUS

## 2016-09-27 NOTE — Progress Notes (Signed)
Nutrition Brief Note  Malnutrition Screening Tool result is inaccurate. Pt's weight up from usual weight of 390 lbs based on weight history. Please consult if nutrition needs are identified.  Scarlette Ar RD, CSP, LDN Inpatient Clinical Dietitian Pager: [phone] After Hours Pager: [phone]

## 2016-09-27 NOTE — Progress Notes (Signed)
Family Medicine Teaching Service Daily Progress Note Intern Pager: [phone]  Patient name: Matthew [name] Medical record number: [id number] Date of birth: 07/14/1956 Age: 60 y.o. Gender: male  Primary Care Provider: [name], MD Consultants: None Code Status: FULL  Pt Overview and Major Events to Date:  12/29 - admitted to FPTS   Assessment and Plan: Matthew [name] is a 60 y.o. male presenting with right hip pain found to have an AKI. PMH is significant for DMII, morbid obesity, alcoholic cirrhosis, alcohol abuse in remission, HTN, depression, atrial fibrillation, GERD, and recent MRSA-related sepsis.  # Right hip pain: Etiology unknown at this time however most likely cause is bursitis vs bony contusion vs incomplete muscle tear. Differential also includes septic arthritis (less likely due to ability to stand and preserved flexion/extension from seated to standing position), necrotizing fasciitis (patient was admitted for MRSA-related sepsis in September, patient has chronic venous stasis issues, but physical exam yielded no significant soft tissue tenderness that was out of proportion from anticipated), and gout (unlikely to initially develop in the hip prior to other distal joints). Initial plain films neg for fracture or dislocation. CT pelvis also with no evidence of fracture or dislocation.  - PT/OT eval - Tylenol, tramadol PRN pain - Ambulate as tolerated - Consider addition of Toradol (once kidney function improves), or greater trochanteric bursal corticosteroid injection (if deemed appropriate)  # R leg cellulitis: Diagnosed by PCP on 12/21. Venous stasis dermatitis could be contributing as well. Failed outpatient tx with TMP/SMX, though uncertain if patient was actually taking medication. Afebrile with WBC WNL. - Zosyn per pharmacy  # AoCKD: Baseline creatinine 1.2-1.4. Likely prerenal 2/2 dehydration from diminished oral intake and increased output from  medications. S/p 1L NS bolus. Cr elevated to 2.27 (from 2.18 yesterday) this AM.  - Additional 1L bolus  - 150 mL/hour normal saline - Hold torsemide - Daily BMP - Avoid nephrotoxic medications. - Continue home [place]  # UTI, suspected: Asymtpomatic, however patient's UA was notably suspicious for UTI. Received one dose CTX in ED.  - Urine culture pending - Cont Zosyn (primarily for cellulitis)  # Alcoholic cirrhosis: Abdominal US with cirrhosis and probable steatosis without focal liver lesions noted on 8/20. Echo EF 65%, mild basal septal LV hypertrophy, indeterminate diastolic function 2/2 afib.  - Holding torsemide (AKI) - Continue spironolactone - Strict I/Os - Daily weights  # DMII: Diet controlled. Cover with SSI. Most recent A1C 5.7 in 09/18/16. CBG 76 this AM.  -Sensitive sliding scale insulin - CBG qACHS  # HTN: Not currently pharmacologically treated. Hypotensive overnight (96/65, 90/59).  - 1L NS bolus - Continue to monitor   # Afib: Rate controlled on dilt, will continue. CHADvasc score is 2, not on anticoagulation due to end stage liver failure.PT INR slightly elevated at 17.2, aPTT WNL (obtained due to notable bleeding at IV site).  - Continue dilt w/ holding parameters SBP<110, DBP<60 - Continue home ASA 81mg   # OSA: Uses CPAP at home.  -continue CPAP qhs  FEN/GI: Heart healthy/carb modified diet, IV normal saline at 150 ml/hour. Prophylaxis: Lovenox  Subjective:  Complaining of continued pain in R hip this AM. Says unchanged from yesterday. Says that he did not know he had pain medication available to him which is why he has not asked for any. Received one dose morphine in ED 25 hours ago, and has not received any additional pain medication since. Says that he is able to walk, however it is very  painful.   Objective: Temp:  [97.6 F (36.4 C)-98.5 F (36.9 C)] 97.8 F (36.6 C) (12/30 0907) Pulse Rate:  [68-115] 81 (12/30 0907) Resp:  [18-22] 20  (12/30 0907) BP: (90-134)/(59-91) 102/79 (12/30 0907) SpO2:  [91 %-96 %] 93 % (12/30 0907) Physical Exam: General: Morbidly obese male laying in bed in NAD Cardiovascular: RRR, no murmurs appreciated Respiratory: CTAB, no wheezes or crackles Abdomen: obese, non-tender non-reducible umbilical hernia present, large pannus with lichenified skin changes Extremities: 1+ edema bilaterally, chronic venous stasis skin changes, bilateral lateral midfoot eschars without erythema, warmth, or drainage Neuro: A&Ox3, no gross deficits Psych: Appropriate mood and affect   Laboratory:  Recent Labs Lab 09/26/16 0045 09/26/16 1141 09/27/16 0533  WBC 13.3* 10.2 10.4  HGB 14.4 15.0 14.2  HCT 40.7 42.2 40.6  PLT 417* 417* 358    Recent Labs Lab 09/26/16 0045 09/26/16 1141 09/27/16 0533  NA 134* 130* 130*  K 3.4* 3.7 3.9  CL 98* 96* 98*  CO2 26 25 22   BUN 22* 20 24*  CREATININE 2.05* 2.18* 2.27*  CALCIUM 8.4* 8.8* 8.4*  PROT 7.0  --   --   BILITOT 1.1  --   --   ALKPHOS 183*  --   --   ALT 21  --   --   AST 36  --   --   GLUCOSE 68 141* 81    Imaging/Diagnostic Tests: Ct Pelvis Wo Contrast  Result Date: 09/26/2016 CLINICAL DATA:  Acute right hip pain. EXAM: CT PELVIS WITHOUT CONTRAST TECHNIQUE: Multidetector CT imaging of the pelvis was performed following the standard protocol without intravenous contrast. COMPARISON:  Radiographs of same day. FINDINGS: Urinary Tract:  Urinary bladder appears normal. Bowel:  There is no evidence of bowel obstruction. Vascular/Lymphatic: Atherosclerosis of visualized portion of abdominal aorta and iliac arteries is noted without aneurysm formation. Reproductive:  Prostate appears normal. Other:  No abnormal fluid collection is noted. Musculoskeletal: There is no evidence of hip fracture or dislocation. Severe degenerative disc disease is noted at L4-5. Sacroiliac joints appear normal. IMPRESSION: Aortic atherosclerosis.  No evidence of hip fracture  dislocation. Electronically Signed   By: Marijo Conception, M.D.   On: 09/26/2016 14:13   Dg Hip Unilat W Or Wo Pelvis 2-3 Views Right  Result Date: 09/26/2016 CLINICAL DATA:  Atraumatic right hip pain. Increasing right hip pain over the past 3 weeks. EXAM: DG HIP (WITH OR WITHOUT PELVIS) 2-3V RIGHT COMPARISON:  None. FINDINGS: Soft tissue attenuation from body habitus limits assessment. No evidence of acute fracture. Femoral head is seated in the acetabulum. Joint space appears preserved. Pubic symphysis and sacroiliac joints are congruent. Pubic rami appear intact. IMPRESSION: Negative radiographs of the right hip allowing for limitations secondary to habitus. Electronically Signed   By: Jeb Levering M.D.   On: 09/26/2016 02:01    Verner Mould, MD 09/27/2016, 10:22 AM PGY-2, [place] Intern pager: [phone], text pages welcome

## 2016-09-27 NOTE — Progress Notes (Signed)
VASCULAR LAB PRELIMINARY  PRELIMINARY  PRELIMINARY  PRELIMINARY  Right lower extremity venous duplex completed.    Preliminary report:  There is no obvious evidence of DVT or SVT noted in the visualized veins of the right lower extremity.  This was a technically limited study secondary to patient's constant movement, body habitus, large pannus, and woody edema in the calf.  Patient has had multiple sub-optimal, non diagnostic studies since August of this year.   Matthew [name], RVT 09/27/2016, 9:45 AM

## 2016-09-27 NOTE — Evaluation (Addendum)
Physical Therapy Evaluation Patient Details Name: Matthew [name] MRN: [id number] DOB: 04-15-1956 Today's Date: 09/27/2016   History of Present Illness  60 y.o. male presenting with right hip pain found to have an AKI. PMH is significant for DMII, morbid obesity, alcoholic cirrhosis, alcohol abuse in remission, HTN, depression, atrial fibrillation, GERD, and recent MRSA related sepsis  Clinical Impression  Pt admitted with above diagnosis. Pt currently with functional limitations due to the deficits listed below (see PT Problem List). PT eval very limited by lethargy and R hip pain. Pt lacking motivation to participate in mobility.  O2 sats 84% on RA. Pt will benefit from skilled PT to increase their independence and safety with mobility to allow discharge to the venue listed below.       Follow Up Recommendations SNF    Equipment Recommendations  None recommended by PT    Recommendations for Other Services       Precautions / Restrictions Precautions Precautions: Fall      Mobility  Bed Mobility               General bed mobility comments: Pt received in recliner.  Transfers Overall transfer level: Needs assistance Equipment used: Rolling walker (2 wheeled) Transfers: Sit to/from [facility] Sit to Stand: Min assist Stand pivot transfers: +2 safety/equipment;Min assist       General transfer comment: Verbal cues for hand placement. Assist to stabilize RW during sit to stand.  Ambulation/Gait             General Gait Details: Unable to ambulate due to R hip pain. Attempted marching in place with RW. Pt unable to shift weight to R.  Stairs            Wheelchair Mobility    Modified Rankin (Stroke Patients Only)       Balance                                             Pertinent Vitals/Pain Pain Assessment: 0-10 Pain Score: 7  Pain Location: R hip Pain Descriptors / Indicators: Aching;Sharp Pain  Intervention(s): Limited activity within patient's tolerance;Monitored during session    Home Living Family/patient expects to be discharged to:: Private residence Living Arrangements: Children Available Help at Discharge: Available PRN/intermittently Type of Home: Apartment Home Access: Level entry     Home Layout: One level Home Equipment: Walker - 4 wheels;Grab bars - tub/shower;Cane - single point      Prior Function Level of Independence: Needs assistance   Gait / Transfers Assistance Needed: walks independently in home, can drive, sponge bathes independently, has groceries delivered  ADL's / Homemaking Assistance Needed: needs assist to clean his tub/shower        Hand Dominance   Dominant Hand: Right    Extremity/Trunk Assessment   Upper Extremity Assessment Upper Extremity Assessment: Overall WFL for tasks assessed    Lower Extremity Assessment Lower Extremity Assessment: RLE deficits/detail RLE: Unable to fully assess due to pain    Cervical / Trunk Assessment Cervical / Trunk Assessment: Normal  Communication   Communication: No difficulties  Cognition Arousal/Alertness: Lethargic Behavior During Therapy: Flat affect Overall Cognitive Status: Within Functional Limits for tasks assessed                      General Comments      Exercises  Assessment/Plan    PT Assessment Patient needs continued PT services  PT Problem List Decreased activity tolerance;Decreased mobility;Decreased balance;Pain;Cardiopulmonary status limiting activity;Obesity          PT Treatment Interventions DME instruction;Gait training;Functional mobility training;Balance training;Therapeutic exercise;Therapeutic activities;Patient/family education    PT Goals (Current goals can be found in the Care Plan section)  Acute Rehab PT Goals Patient Stated Goal: home PT Goal Formulation: With patient Time For Goal Achievement: 10/11/16 Potential to Achieve Goals:  Fair    Frequency Min 3X/week   Barriers to discharge Decreased caregiver support      Co-evaluation               End of Session   Activity Tolerance: Patient limited by pain;Patient limited by lethargy Patient left: in chair;with call bell/phone within reach Nurse Communication: Mobility status;Other (comment) (O2 sats 84% on RA)         Time: [id number] PT Time Calculation (min) (ACUTE ONLY): 16 min   Charges:   PT Evaluation $PT Eval Moderate Complexity: 1 Procedure     PT G Codes:        Matthew [name] 09/27/2016, 1:54 PM

## 2016-09-28 ENCOUNTER — Inpatient Hospital Stay (HOSPITAL_COMMUNITY): Payer: Medicare Other

## 2016-09-28 ENCOUNTER — Inpatient Hospital Stay (HOSPITAL_COMMUNITY): Payer: Medicare Other | Admitting: Anesthesiology

## 2016-09-28 DIAGNOSIS — A419 Sepsis, unspecified organism: Secondary | ICD-10-CM

## 2016-09-28 DIAGNOSIS — N170 Acute kidney failure with tubular necrosis: Secondary | ICD-10-CM

## 2016-09-28 DIAGNOSIS — J9601 Acute respiratory failure with hypoxia: Secondary | ICD-10-CM

## 2016-09-28 DIAGNOSIS — Z452 Encounter for adjustment and management of vascular access device: Secondary | ICD-10-CM

## 2016-09-28 DIAGNOSIS — R6521 Severe sepsis with septic shock: Secondary | ICD-10-CM

## 2016-09-28 DIAGNOSIS — J9690 Respiratory failure, unspecified, unspecified whether with hypoxia or hypercapnia: Secondary | ICD-10-CM | POA: Diagnosis present

## 2016-09-28 DIAGNOSIS — L03116 Cellulitis of left lower limb: Secondary | ICD-10-CM

## 2016-09-28 LAB — COMPREHENSIVE METABOLIC PANEL
ALBUMIN: 2.1 g/dL — AB (ref 3.5–5.0)
ALT: 35 U/L (ref 17–63)
AST: 75 U/L — ABNORMAL HIGH (ref 15–41)
Alkaline Phosphatase: 179 U/L — ABNORMAL HIGH (ref 38–126)
Anion gap: 10 (ref 5–15)
BUN: 29 mg/dL — ABNORMAL HIGH (ref 6–20)
CHLORIDE: 98 mmol/L — AB (ref 101–111)
CO2: 21 mmol/L — ABNORMAL LOW (ref 22–32)
Calcium: 8.5 mg/dL — ABNORMAL LOW (ref 8.9–10.3)
Creatinine, Ser: 2.94 mg/dL — ABNORMAL HIGH (ref 0.61–1.24)
GFR calc Af Amer: 25 mL/min — ABNORMAL LOW (ref >60)
GFR calc non Af Amer: 22 mL/min — ABNORMAL LOW (ref >60)
GLUCOSE: 113 mg/dL — AB (ref 65–99)
POTASSIUM: 5.3 mmol/L — AB (ref 3.5–5.1)
Sodium: 129 mmol/L — ABNORMAL LOW (ref 135–145)
Total Bilirubin: 1.5 mg/dL — ABNORMAL HIGH (ref 0.3–1.2)
Total Protein: 7.3 g/dL (ref 6.5–8.1)

## 2016-09-28 LAB — POCT I-STAT 3, ART BLOOD GAS (G3+)
ACID-BASE DEFICIT: 9 mmol/L — AB (ref 0.0–2.0)
ACID-BASE DEFICIT: 9 mmol/L — AB (ref 0.0–2.0)
Acid-base deficit: 10 mmol/L — ABNORMAL HIGH (ref 0.0–2.0)
Acid-base deficit: 9 mmol/L — ABNORMAL HIGH (ref 0.0–2.0)
BICARBONATE: 18.8 mmol/L — AB (ref 20.0–28.0)
BICARBONATE: 20 mmol/L (ref 20.0–28.0)
BICARBONATE: 18.8 mmol/L — AB (ref 20.0–28.0)
Bicarbonate: 17.9 mmol/L — ABNORMAL LOW (ref 20.0–28.0)
O2 SAT: 95 %
O2 SAT: 96 %
O2 SAT: 98 %
O2 Saturation: 91 %
PCO2 ART: 43.4 mmHg (ref 32.0–48.0)
PO2 ART: 94 mmHg (ref 83.0–108.0)
PO2 ART: 129 mmHg — AB (ref 83.0–108.0)
Patient temperature: 98.2
Patient temperature: 98.2
Patient temperature: 98.7
TCO2: 20 mmol/L (ref 0–100)
TCO2: 19 mmol/L (ref 0–100)
TCO2: 22 mmol/L (ref 0–100)
TCO2: 20 mmol/L (ref 0–100)
pCO2 arterial: 46.8 mmHg (ref 32.0–48.0)
pCO2 arterial: 56.4 mmHg — ABNORMAL HIGH (ref 32.0–48.0)
pCO2 arterial: 45.3 mmHg (ref 32.0–48.0)
pH, Arterial: 7.212 — ABNORMAL LOW (ref 7.350–7.450)
pH, Arterial: 7.222 — ABNORMAL LOW (ref 7.350–7.450)
pH, Arterial: 7.157 — CL (ref 7.350–7.450)
pH, Arterial: 7.227 — ABNORMAL LOW (ref 7.350–7.450)
pO2, Arterial: 97 mmHg (ref 83.0–108.0)
pO2, Arterial: 79 mmHg — ABNORMAL LOW (ref 83.0–108.0)

## 2016-09-28 LAB — BLOOD GAS, ARTERIAL
ACID-BASE DEFICIT: 8.9 mmol/L — AB (ref 0.0–2.0)
BICARBONATE: 20.1 mmol/L (ref 20.0–28.0)
DRAWN BY: 419771
FIO2: 100
O2 Saturation: 96.2 %
PCO2 ART: 75.7 mmHg — AB (ref 32.0–48.0)
PH ART: 7.053 — AB (ref 7.350–7.450)
Patient temperature: 98.6
pO2, Arterial: 117 mmHg — ABNORMAL HIGH (ref 83.0–108.0)

## 2016-09-28 LAB — CBC WITH DIFFERENTIAL/PLATELET
BASOS ABS: 0 10*3/uL (ref 0.0–0.1)
BASOS PCT: 0 %
EOS ABS: 0 10*3/uL (ref 0.0–0.7)
Eosinophils Relative: 0 %
HEMATOCRIT: 41.9 % (ref 39.0–52.0)
Hemoglobin: 14.2 g/dL (ref 13.0–17.0)
Lymphocytes Relative: 4 %
Lymphs Abs: 0.9 10*3/uL (ref 0.7–4.0)
MCH: 32.7 pg (ref 26.0–34.0)
MCHC: 33.9 g/dL (ref 30.0–36.0)
MCV: 96.5 fL (ref 78.0–100.0)
MONO ABS: 0.8 10*3/uL (ref 0.1–1.0)
Monocytes Relative: 4 %
NEUTROS ABS: 18.1 10*3/uL — AB (ref 1.7–7.7)
NEUTROS PCT: 92 %
PLATELETS: 355 10*3/uL (ref 150–400)
RBC: 4.34 MIL/uL (ref 4.22–5.81)
RDW: 18.6 % — AB (ref 11.5–15.5)
WBC: 19.8 10*3/uL — ABNORMAL HIGH (ref 4.0–10.5)

## 2016-09-28 LAB — PROTIME-INR
INR: 1.59
PROTHROMBIN TIME: 19.1 s — AB (ref 11.4–15.2)

## 2016-09-28 LAB — BASIC METABOLIC PANEL
ANION GAP: 14 (ref 5–15)
Anion gap: 12 (ref 5–15)
Anion gap: 8 (ref 5–15)
BUN: 30 mg/dL — AB (ref 6–20)
BUN: 31 mg/dL — AB (ref 6–20)
BUN: 35 mg/dL — AB (ref 6–20)
CALCIUM: 8.7 mg/dL — AB (ref 8.9–10.3)
CALCIUM: 8.1 mg/dL — AB (ref 8.9–10.3)
CO2: 19 mmol/L — ABNORMAL LOW (ref 22–32)
CO2: 21 mmol/L — ABNORMAL LOW (ref 22–32)
CO2: 17 mmol/L — ABNORMAL LOW (ref 22–32)
CREATININE: 2.73 mg/dL — AB (ref 0.61–1.24)
CREATININE: 2.94 mg/dL — AB (ref 0.61–1.24)
CREATININE: 3.4 mg/dL — AB (ref 0.61–1.24)
Calcium: 8 mg/dL — ABNORMAL LOW (ref 8.9–10.3)
Chloride: 97 mmol/L — ABNORMAL LOW (ref 101–111)
Chloride: 99 mmol/L — ABNORMAL LOW (ref 101–111)
Chloride: 98 mmol/L — ABNORMAL LOW (ref 101–111)
GFR calc Af Amer: 28 mL/min — ABNORMAL LOW (ref >60)
GFR calc Af Amer: 25 mL/min — ABNORMAL LOW (ref >60)
GFR calc Af Amer: 21 mL/min — ABNORMAL LOW (ref >60)
GFR, EST NON AFRICAN AMERICAN: 24 mL/min — AB (ref >60)
GFR, EST NON AFRICAN AMERICAN: 22 mL/min — AB (ref >60)
GFR, EST NON AFRICAN AMERICAN: 18 mL/min — AB (ref >60)
GLUCOSE: 120 mg/dL — AB (ref 65–99)
GLUCOSE: 128 mg/dL — AB (ref 65–99)
GLUCOSE: 160 mg/dL — AB (ref 65–99)
POTASSIUM: 5.7 mmol/L — AB (ref 3.5–5.1)
POTASSIUM: 4.6 mmol/L (ref 3.5–5.1)
Potassium: 5.1 mmol/L (ref 3.5–5.1)
SODIUM: 128 mmol/L — AB (ref 135–145)
Sodium: 128 mmol/L — ABNORMAL LOW (ref 135–145)
Sodium: 129 mmol/L — ABNORMAL LOW (ref 135–145)

## 2016-09-28 LAB — RESPIRATORY PANEL BY PCR
Adenovirus: NOT DETECTED
BORDETELLA PERTUSSIS-RVPCR: NOT DETECTED
CHLAMYDOPHILA PNEUMONIAE-RVPPCR: NOT DETECTED
CORONAVIRUS 229E-RVPPCR: NOT DETECTED
CORONAVIRUS HKU1-RVPPCR: NOT DETECTED
Coronavirus NL63: NOT DETECTED
Coronavirus OC43: NOT DETECTED
INFLUENZA B-RVPPCR: NOT DETECTED
Influenza A: NOT DETECTED
MYCOPLASMA PNEUMONIAE-RVPPCR: NOT DETECTED
Metapneumovirus: NOT DETECTED
PARAINFLUENZA VIRUS 2-RVPPCR: NOT DETECTED
Parainfluenza Virus 1: NOT DETECTED
Parainfluenza Virus 3: NOT DETECTED
Parainfluenza Virus 4: NOT DETECTED
RESPIRATORY SYNCYTIAL VIRUS-RVPPCR: NOT DETECTED
Rhinovirus / Enterovirus: NOT DETECTED

## 2016-09-28 LAB — COOXEMETRY PANEL
CARBOXYHEMOGLOBIN: 0.5 % (ref 0.5–1.5)
METHEMOGLOBIN: 1 % (ref 0.0–1.5)
O2 SAT: 83.4 %
TOTAL HEMOGLOBIN: 16.7 g/dL — AB (ref 12.0–16.0)

## 2016-09-28 LAB — C-REACTIVE PROTEIN: CRP: 3 mg/dL — AB (ref <1.0)

## 2016-09-28 LAB — SODIUM, URINE, RANDOM: SODIUM UR: 10 mmol/L

## 2016-09-28 LAB — URINALYSIS, ROUTINE W REFLEX MICROSCOPIC
Bilirubin Urine: NEGATIVE
GLUCOSE, UA: NEGATIVE mg/dL
Hgb urine dipstick: NEGATIVE
KETONES UR: NEGATIVE mg/dL
LEUKOCYTES UA: NEGATIVE
Nitrite: NEGATIVE
PH: 5 (ref 5.0–8.0)
Protein, ur: NEGATIVE mg/dL
SPECIFIC GRAVITY, URINE: 1.014 (ref 1.005–1.030)

## 2016-09-28 LAB — LACTIC ACID, PLASMA
LACTIC ACID, VENOUS: 3.9 mmol/L — AB (ref 0.5–1.9)
LACTIC ACID, VENOUS: 2.3 mmol/L — AB (ref 0.5–1.9)
LACTIC ACID, VENOUS: 1.9 mmol/L (ref 0.5–1.9)

## 2016-09-28 LAB — TSH: TSH: 0.312 u[IU]/mL — AB (ref 0.350–4.500)

## 2016-09-28 LAB — APTT: APTT: 47 s — AB (ref 24–36)

## 2016-09-28 LAB — CORTISOL: Cortisol, Plasma: 23.2 ug/dL

## 2016-09-28 LAB — GLUCOSE, CAPILLARY
Glucose-Capillary: 148 mg/dL — ABNORMAL HIGH (ref 65–99)
Glucose-Capillary: 142 mg/dL — ABNORMAL HIGH (ref 65–99)
Glucose-Capillary: 136 mg/dL — ABNORMAL HIGH (ref 65–99)

## 2016-09-28 LAB — BRAIN NATRIURETIC PEPTIDE: B NATRIURETIC PEPTIDE 5: 757.3 pg/mL — AB (ref 0.0–100.0)

## 2016-09-28 LAB — CREATININE, URINE, RANDOM: CREATININE, URINE: 117.91 mg/dL

## 2016-09-28 LAB — TROPONIN I: TROPONIN I: 0.03 ng/mL — AB (ref <0.03)

## 2016-09-28 LAB — AMMONIA: AMMONIA: 165 umol/L — AB (ref 9–35)

## 2016-09-28 LAB — PHOSPHORUS: Phosphorus: 7.9 mg/dL — ABNORMAL HIGH (ref 2.5–4.6)

## 2016-09-28 LAB — MAGNESIUM: MAGNESIUM: 2.5 mg/dL — AB (ref 1.7–2.4)

## 2016-09-28 LAB — PROCALCITONIN: Procalcitonin: 7.66 ng/mL

## 2016-09-28 MED ORDER — LORAZEPAM 2 MG/ML IJ SOLN
2.0000 mg | Freq: Once | INTRAMUSCULAR | Status: AC
  Administered 2016-09-28: 2 mg via INTRAVENOUS

## 2016-09-28 MED ORDER — SODIUM CHLORIDE 0.9 % IV BOLUS (SEPSIS)
750.0000 mL | Freq: Once | INTRAVENOUS | Status: AC
  Administered 2016-09-28: 750 mL via INTRAVENOUS

## 2016-09-28 MED ORDER — DOBUTAMINE IN D5W 4-5 MG/ML-% IV SOLN
2.5000 ug/kg/min | INTRAVENOUS | Status: DC
  Administered 2016-09-28: 2.5 ug/kg/min via INTRAVENOUS

## 2016-09-28 MED ORDER — MIDAZOLAM HCL 2 MG/2ML IJ SOLN
INTRAMUSCULAR | Status: AC
  Administered 2016-09-28: 4 mg via INTRAVENOUS
  Filled 2016-09-28: qty 4

## 2016-09-28 MED ORDER — VANCOMYCIN HCL 10 G IV SOLR
2000.0000 mg | INTRAVENOUS | Status: DC
  Administered 2016-09-30: 2000 mg via INTRAVENOUS
  Filled 2016-09-28: qty 2000

## 2016-09-28 MED ORDER — SODIUM CHLORIDE 0.9 % IV BOLUS (SEPSIS)
500.0000 mL | Freq: Once | INTRAVENOUS | Status: AC
  Administered 2016-09-28: 500 mL via INTRAVENOUS

## 2016-09-28 MED ORDER — PANTOPRAZOLE SODIUM 40 MG IV SOLR
40.0000 mg | Freq: Every day | INTRAVENOUS | Status: DC
  Administered 2016-09-28: 40 mg via INTRAVENOUS
  Filled 2016-09-28: qty 40

## 2016-09-28 MED ORDER — LORAZEPAM 2 MG/ML IJ SOLN: 2.0000 mg | Freq: Once | INTRAMUSCULAR | Status: DC | PRN

## 2016-09-28 MED ORDER — NOREPINEPHRINE BITARTRATE 1 MG/ML IV SOLN
0.0000 ug/min | INTRAVENOUS | Status: DC
  Administered 2016-09-28: 40 ug/min via INTRAVENOUS
  Administered 2016-09-28: 10 ug/min via INTRAVENOUS
  Administered 2016-09-29: 35 ug/min via INTRAVENOUS
  Administered 2016-09-29: 25 ug/min via INTRAVENOUS
  Administered 2016-10-01: 5 ug/min via INTRAVENOUS
  Filled 2016-09-28 (×5): qty 16

## 2016-09-28 MED ORDER — EPINEPHRINE PF 1 MG/10ML IJ SOSY
PREFILLED_SYRINGE | INTRAMUSCULAR | Status: AC
  Filled 2016-09-28: qty 10

## 2016-09-28 MED ORDER — ASPIRIN 81 MG PO CHEW
81.0000 mg | CHEWABLE_TABLET | Freq: Every day | ORAL | Status: DC
  Administered 2016-09-28 – 2016-10-03 (×6): 81 mg
  Filled 2016-09-28 (×6): qty 1

## 2016-09-28 MED ORDER — SODIUM BICARBONATE 8.4 % IV SOLN
INTRAVENOUS | Status: AC
  Filled 2016-09-28: qty 50

## 2016-09-28 MED ORDER — SODIUM BICARBONATE 8.4 % IV SOLN
INTRAVENOUS | Status: AC
  Administered 2016-09-28: 50 meq via INTRAVENOUS
  Filled 2016-09-28: qty 50

## 2016-09-28 MED ORDER — LORAZEPAM 1 MG PO TABS: 2.0000 mg | ORAL_TABLET | Freq: Once | ORAL | Status: DC | PRN

## 2016-09-28 MED ORDER — VANCOMYCIN HCL 10 G IV SOLR
2500.0000 mg | Freq: Once | INTRAVENOUS | Status: AC
  Administered 2016-09-28: 2500 mg via INTRAVENOUS
  Filled 2016-09-28: qty 2500

## 2016-09-28 MED ORDER — HALOPERIDOL LACTATE 5 MG/ML IJ SOLN
INTRAMUSCULAR | Status: AC
  Administered 2016-09-28: 7.5 mg via INTRAVENOUS
  Filled 2016-09-28: qty 2

## 2016-09-28 MED ORDER — SODIUM BICARBONATE 8.4 % IV SOLN
50.0000 meq | Freq: Once | INTRAVENOUS | Status: AC
  Administered 2016-09-28: 50 meq via INTRAVENOUS

## 2016-09-28 MED ORDER — HALOPERIDOL LACTATE 5 MG/ML IJ SOLN
7.5000 mg | Freq: Once | INTRAMUSCULAR | Status: AC
  Administered 2016-09-28: 7.5 mg via INTRAVENOUS

## 2016-09-28 MED ORDER — VASOPRESSIN 20 UNIT/ML IV SOLN
0.0300 [IU]/min | INTRAVENOUS | Status: DC
  Administered 2016-09-28 – 2016-09-29 (×2): 0.03 [IU]/min via INTRAVENOUS
  Filled 2016-09-28 (×3): qty 2

## 2016-09-28 MED ORDER — CHLORHEXIDINE GLUCONATE 0.12% ORAL RINSE (MEDLINE KIT)
15.0000 mL | Freq: Two times a day (BID) | OROMUCOSAL | Status: DC
  Administered 2016-09-28 – 2016-09-29 (×4): 15 mL via OROMUCOSAL

## 2016-09-28 MED ORDER — FUROSEMIDE 10 MG/ML IJ SOLN: 40.0000 mg | Freq: Once | INTRAMUSCULAR | Status: DC

## 2016-09-28 MED ORDER — LORAZEPAM 2 MG/ML IJ SOLN
INTRAMUSCULAR | Status: AC
  Filled 2016-09-28: qty 1

## 2016-09-28 MED ORDER — DOBUTAMINE IN D5W 4-5 MG/ML-% IV SOLN
INTRAVENOUS | Status: AC
  Filled 2016-09-28: qty 250

## 2016-09-28 MED ORDER — ORAL CARE MOUTH RINSE
15.0000 mL | Freq: Four times a day (QID) | OROMUCOSAL | Status: DC
  Administered 2016-09-28 – 2016-09-30 (×8): 15 mL via OROMUCOSAL

## 2016-09-28 MED ORDER — SODIUM CHLORIDE 0.9% FLUSH
10.0000 mL | Freq: Two times a day (BID) | INTRAVENOUS | Status: DC
  Administered 2016-09-28 – 2016-09-29 (×3): 10 mL
  Administered 2016-09-29: 20 mL
  Administered 2016-09-30: 10 mL
  Administered 2016-10-01: 30 mL
  Administered 2016-10-01 – 2016-10-03 (×4): 10 mL

## 2016-09-28 MED ORDER — SODIUM CHLORIDE 0.9 % IV BOLUS (SEPSIS)
500.0000 mL | Freq: Once | INTRAVENOUS | Status: AC
  Administered 2016-09-28: 300 mL via INTRAVENOUS

## 2016-09-28 MED ORDER — SODIUM CHLORIDE 0.9 % IV BOLUS (SEPSIS)
1000.0000 mL | Freq: Once | INTRAVENOUS | Status: AC
  Administered 2016-09-28: 1000 mL via INTRAVENOUS

## 2016-09-28 MED ORDER — PHENYLEPHRINE HCL 10 MG/ML IJ SOLN
30.0000 ug/min | INTRAVENOUS | Status: DC
  Administered 2016-09-28: 50 ug/min via INTRAVENOUS
  Filled 2016-09-28 (×4): qty 1

## 2016-09-28 MED ORDER — ROCURONIUM BROMIDE 50 MG/5ML IV SOLN
150.0000 mg | Freq: Once | INTRAVENOUS | Status: AC
  Administered 2016-09-28: 150 mg via INTRAVENOUS

## 2016-09-28 MED ORDER — MIDAZOLAM HCL 2 MG/2ML IJ SOLN
4.0000 mg | Freq: Once | INTRAMUSCULAR | Status: AC
  Administered 2016-09-28: 4 mg via INTRAVENOUS

## 2016-09-28 MED ORDER — SODIUM CHLORIDE 0.9% FLUSH: 10.0000 mL | INTRAVENOUS | Status: DC | PRN

## 2016-09-28 MED ORDER — INSULIN ASPART 100 UNIT/ML ~~LOC~~ SOLN
0.0000 [IU] | SUBCUTANEOUS | Status: DC
  Administered 2016-09-28 – 2016-09-29 (×5): 1 [IU] via SUBCUTANEOUS

## 2016-09-28 MED ORDER — HEPARIN SODIUM (PORCINE) 5000 UNIT/ML IJ SOLN
5000.0000 [IU] | Freq: Three times a day (TID) | INTRAMUSCULAR | Status: DC
Start: 2016-09-28 — End: 2016-09-29
  Administered 2016-09-28 – 2016-09-29 (×3): 5000 [IU] via SUBCUTANEOUS
  Filled 2016-09-28 (×3): qty 1

## 2016-09-28 MED ORDER — FENTANYL 2500MCG IN NS 250ML (10MCG/ML) PREMIX INFUSION
0.0000 ug/h | INTRAVENOUS | Status: DC
  Administered 2016-09-28: 50 ug/h via INTRAVENOUS
  Filled 2016-09-28: qty 250

## 2016-09-28 MED ORDER — MIDAZOLAM HCL 2 MG/2ML IJ SOLN: 2.0000 mg | INTRAMUSCULAR | Status: DC | PRN

## 2016-09-28 NOTE — Progress Notes (Signed)
FPTS Interim Progress Note  Received page from RN to see patient at around 2:15 AM for increased combativeness and agitation.  Patient with O2 sats in the 60s and unable to place Foley catheter.  Patient seen by myself and Dr. Avon Gully at this time. Rapid response and RT called to bedside as well.   1 mg Ativan last given two hours ago at midnight.   O2 sats improved to 95% on NRB while in room.  2 mg Ativan ordered.  ABG obtained with pH 7.05.  Remains very combative, wrist restraints placed.  Concern that patient will be unable to keep NRB on if unrestrained.  CCM consulted.    BP (!) 101/43 (BP Location: Left Arm)   Pulse 98   Temp 98.7 F (37.1 C) (Oral)   Resp 18   SpO2 (!) 72%    Lovenia Kim, MD 09/28/2016, 2:56 AM PGY-1, [place] [facility] Service pager [phone]

## 2016-09-28 NOTE — Progress Notes (Signed)
FloTrak started. Marni Griffon, NP at bedside. SV 50 on initiation. After 750 mL boluses, SV dropped to 40, boluses stopped per Kary Kos. Levophed started as ordered.

## 2016-09-28 NOTE — Code Documentation (Signed)
  Patient Name: Matthew [name]   MRN: [id number]   Date of Birth/ Sex: 09-Mar-1956 , male      Admission Date: 09/15/2016  Attending Provider: Blane Ohara McDiarmid, MD  Primary Diagnosis: Acute right hip pain   Indication: Pt was in his usual state of health until this AM, when he was noted to be in respiratory distress; patient evaluated by primary team and rapid response earlier for desaturation and severe agitation. At this time patient was given Ativan 2g and placed on non-rebreather. PCCM was consulted. Patient continued to have progressive agitation and increasing work of breathing. Code blue was subsequently called. At the time of arrival on scene, ACLS protocol was underway. Patient admitted for right hip pain and AKI; h/o PAF on ASA, alcoholic cirrhosis, OSA.   Technical Description:  - CPR performance duration:  11 minutes  - Was defibrillation or cardioversion used? No   - Was external pacer placed? Yes  - Was patient intubated pre/post CPR? Yes   Medications Administered: Y = Yes; Blank = No Amiodarone    Atropine    Calcium    Epinephrine  1  Lidocaine    Magnesium    Norepinephrine    Phenylephrine    Sodium bicarbonate    Vasopressin     Post CPR evaluation:  - Final Status - Was patient successfully resuscitated ? Yes - What is current rhythm? SR - What is current hemodynamic status? stable  Miscellaneous Information:  - Labs sent, including: CMP, CBC, Mg, troponin  - Primary team notified?  Yes  - Family Notified? No  - Additional notes/ transfer status: Dr. Tamala Julian from PCCM at bedside shortly after code team arrival. Patient to be transferred to ICU.      Alphonzo Grieve, MD  09/28/2016, 3:42 AM

## 2016-09-28 NOTE — Progress Notes (Signed)
Report called to [name] on 4N.  Patient transported to 4N with nursing staff, rapid response nurse and respiratory.  Intubated and ventilated with ambu bag.  Oxygen saturations 100%, NSR pulse rate rate 75,   doppler pulses at carotid site corollate with monitor rate , BP 124/54.    Message left on [name] voice mail notifying him of his brothers condition and new room number.  [phone] was the only phone number on file.

## 2016-09-28 NOTE — Progress Notes (Signed)
Dr. Diona Fanti requested 20mg  etomidate pulled and ready for ETT exchange, it was never used. Wasted in sink

## 2016-09-28 NOTE — Progress Notes (Signed)
Pharmacy Antibiotic Note  Matthew [name] is a 60 y.o. male admitted on 09/05/2016 with sepsis.  Pharmacy has been consulted for vancomycin dosing. Wt 193 kg, creat 2.94, normalized creat cl ~ 28 ml/min.  Pt on zosyn since 12/29. WBC 19.3.   Plan: vancomycin 2500 mg IV loading dose Vancomycin 2000 mg IV every 48 hours.  Goal trough 15-20 mcg/mL. Zosyn 3.375g IV q8h (4 hour infusion).  Weight: (!) 425 lb 7.8 oz (193 kg)  Temp (24hrs), Avg:98.5 F (36.9 C), Min:97.9 F (36.6 C), Max:98.9 F (37.2 C)   Recent Labs Lab 09/26/16 0045 09/26/16 1141 09/27/16 0533 09/28/16 0245 09/28/16 0451 09/28/16 1035 09/28/16 1130  WBC 13.3* 10.2 10.4  --  19.8*  --   --   CREATININE 2.05* 2.18* 2.27* 2.73* 2.94*  --  2.94*  LATICACIDVEN  --   --   --   --  3.9* 2.3*  --     Estimated Creatinine Clearance: 46.3 mL/min (by C-G formula based on SCr of 2.94 mg/dL (H)).    Allergies  Allergen Reactions  . Potassium-Containing Compounds Other (See Comments)    Reaction:  Stomach pain   . Cymbalta [Duloxetine Hcl]     Strange feeling in brain.  . Augmentin [Amoxicillin-Pot Clavulanate] Nausea Only and Other (See Comments)    Has patient had a PCN reaction causing immediate rash, facial/tongue/throat swelling, SOB or lightheadedness with hypotension: No Has patient had a PCN reaction causing severe rash involving mucus membranes or skin necrosis: No Has patient had a PCN reaction that required hospitalization No Has patient had a PCN reaction occurring within the last 10 years: Yes If all of the above answers are "NO", then may proceed with Cephalosporin use.  [name] Neomycin Rash    Antimicrobials this admission:  Zosyn 12/29> Vanc 12/31>> CTX x 1 dose 12/29>> Rifaximin 12/30>>   Dose adjustments this admission:   Microbiology results:  12/29  BCx: ngtd 12/29 UCx: Citrobacter sens to ceftriaxone, Zosyn, Cipro, Bactrim, Nitrofurantoin 12/29 MRSA PCR: neg  Eudelia Bunch,  Pharm.D. [id number] 09/28/2016 10:04 PM

## 2016-09-28 NOTE — Progress Notes (Signed)
Dr. Corinna Lines notified of Flotrac cuff damaged by pt during agitated episode, currently looking for replacement. Updated on MAP, ETT leak and CVP orders received

## 2016-09-28 NOTE — Consult Note (Signed)
PULMONARY / CRITICAL CARE MEDICINE   Name: Matthew [name] MRN: [id number] DOB: 1955-12-08    ADMISSION DATE:  08/29/2016 CONSULTATION DATE:  @TODAY @   REFERRING MD:    CHIEF COMPLAINT:  CODE BLUE  HISTORY OF PRESENT ILLNESS:   Matthew [name] is a 60 year old chronically ill male for whom CCM was consulted for acute respiratory distress and agitation. The patient was managed on the family medicine service for leg pain, AKI, and UTI. The patient grew increasingly agitated this evening and early morning. A total of 3 mg of ativan was administered. Pulse oximeter was difficult to obtain. ABG was obtained and with a pH of 7.05/75/117 on non rebreather mask. Bedside RN witnessed apnea, lost a carotid pulse and CODE BLUE was called.  The patient was coded of 4 minutes in total. He was intubated during the code with Glidescope and 7.5 ETT. During the code, the rhythm was PEA arrest with 1 dose epininephrine administered prior to ROSC.   From the time of CCM consult call to my arrival to the Matthew [name] bedside, the patient had coded and been intubated. Vital signs taken at my arrival were of hemodynamic stability MAP > 65, Systolic blood pressure of 116 (without vasoactive medication), pulse ox showed 100% saturations, and HR in the 80s. ETT was with pink frothy sputum. Head and neck demonstrated erythema.   CXR with ETT in trachea, diffuse patchy infiltrates and widened cardiac silhouette.   Upon arrival to ICU 4N bed 30, the patient was placed on a ventilator with PEEP of 15, Fi02 of 60%.   After discussion with the patient's brother, additional history was obtained. [name] suffers from cirrhosis, congestive heart failure. He lives alone but occasionally his 73 year old son stays with him (son is named Scientific laboratory technician). Matthew [name] is currently staying with his mother in [place]. Matthew [name] (brother) states that he will call Matthew [name] himself. Matthew [name] lives in [place] and would like to coordinate a goals of  care discussion after he speaks with Matthew [name].   PAST MEDICAL HISTORY :  He  has a past medical history of Atrial fibrillation, chronic (HCC); CHF (congestive heart failure) ([place]); GERD (gastroesophageal reflux disease); Hypertension; Septic shock ([place]); and Shortness of breath dyspnea.  PAST SURGICAL HISTORY: He  has a past surgical history that includes Splenectomy; Tonsilectomy, adenoidectomy, bilateral myringotomy and tubes; Knee arthroscopy; Hernia repair; and TEE without cardioversion (N/A, 05/21/2016).  Allergies  Allergen Reactions  . Potassium-Containing Compounds Other (See Comments)    Reaction:  Stomach pain   . Cymbalta [Duloxetine Hcl]     Strange feeling in brain.  . Augmentin [Amoxicillin-Pot Clavulanate] Nausea Only and Other (See Comments)    Has patient had a PCN reaction causing immediate rash, facial/tongue/throat swelling, SOB or lightheadedness with hypotension: No Has patient had a PCN reaction causing severe rash involving mucus membranes or skin necrosis: No Has patient had a PCN reaction that required hospitalization No Has patient had a PCN reaction occurring within the last 10 years: Yes If all of the above answers are "NO", then may proceed with Cephalosporin use.  [name] Neomycin Rash    No current facility-administered medications on file prior to encounter.    Current Outpatient Prescriptions on File Prior to Encounter  Medication Sig  . aspirin EC 81 MG EC tablet Take 1 tablet (81 mg total) by mouth daily.  [name] CARTIA XT 180 MG 24 hr capsule TAKE 1 CAPSULE BY MOUTH DAILY.  [name] colchicine 0.6 MG tablet  Take one tablet by mouth twice daily for hip pain for next 7 days as directed  . gabapentin (NEURONTIN) 300 MG capsule Take 1 capsule (300 mg total) by mouth 3 (three) times daily. [name]  . NYSTATIN powder APPLY TOPICALLY 3 (THREE) TIMES DAILY. (Patient taking differently: Apply topically 3 (three) times daily as needed. )  . potassium chloride SA  (K-DUR,KLOR-CON) 20 MEQ tablet Take 1 tablet (20 mEq total) by mouth daily.  [name] spironolactone (ALDACTONE) 100 MG tablet TAKE 1/2 TABLET BY MOUTH TWICE DAILY  . sulfamethoxazole-trimethoprim (BACTRIM DS,SEPTRA DS) 800-160 MG tablet Take 1 tablet by mouth 2 (two) times daily.  [name] torsemide (DEMADEX) 100 MG tablet TAKE 1 TABLET BY MOUTH EVERY MORNING AND 1/2 TABLET EVERY EVENING    FAMILY HISTORY:  His has no family status information on file.    SOCIAL HISTORY: He  reports that he quit smoking about 41 years ago. His smoking use included Cigarettes. He has never used smokeless tobacco. He reports that he drinks alcohol.  REVIEW OF SYSTEMS:   Unable to obtain 2/2 acute encephalopathy  VITAL SIGNS: BP (!) 101/43 (BP Location: Left Arm)   Pulse 98   Temp 98.7 F (37.1 C) (Oral)   Resp 18   SpO2 (!) 72%   HEMODYNAMICS:    VENTILATOR SETTINGS:      INTAKE / OUTPUT: I/O last 3 completed shifts: In: 1858.5 [P.O.:241; I.V.:567.5; IV Piggyback:1050] Out: 450 [Urine:450]  PHYSICAL EXAMINATION: Physical Exam: Temp:  [97.6 F (36.4 C)-98.7 F (37.1 C)] 98.7 F (37.1 C) (12/31 0150) Pulse Rate:  [68-98] 98 (12/31 0150) Resp:  [18-22] 18 (12/31 0150) BP: (90-131)/(43-79) 101/43 (12/31 0150) SpO2:  [72 %-93 %] 72 % (12/31 0150)  General Intubated, not responsive to verbal command  HEENT No gross abnormalities.  Pulmonary Diffuse rhonchi. No wheezing. Thick pink secretions suctioned from ETT  Cardiovascular Normal rate, regular rhythm. S1, s2.  Abdomen Obese abdomen,Soft, non-tender, non-distended, positive bowel sounds  Neurologic Intubated, not responsive to verbal command, non responsive to sternal rub, no purposeful movements of extremities  Skin/Integuement No rash, no cyanosis, no clubbing.     LABS:  BMET  Recent Labs Lab 09/26/16 0045 09/26/16 1141 09/27/16 0533  NA 134* 130* 130*  K 3.4* 3.7 3.9  CL 98* 96* 98*  CO2 26 25 22   BUN 22* 20 24*  CREATININE 2.05*  2.18* 2.27*  GLUCOSE 68 141* 81    Electrolytes  Recent Labs Lab 09/26/16 0045 09/26/16 1141 09/27/16 0533  CALCIUM 8.4* 8.8* 8.4*  MG  --  2.2  --     CBC  Recent Labs Lab 09/26/16 0045 09/26/16 1141 09/27/16 0533  WBC 13.3* 10.2 10.4  HGB 14.4 15.0 14.2  HCT 40.7 42.2 40.6  PLT 417* 417* 358    Coag's  Recent Labs Lab 09/26/16 1648  APTT 35  INR 1.39    Sepsis Markers No results for input(s): LATICACIDVEN, PROCALCITON, O2SATVEN in the last 168 hours.  ABG  Recent Labs Lab 09/28/16 0240  PHART 7.053*  PCO2ART 75.7*  PO2ART 117*    Liver Enzymes  Recent Labs Lab 09/26/16 0045  AST 36  ALT 21  ALKPHOS 183*  BILITOT 1.1  ALBUMIN 2.5*    Cardiac Enzymes No results for input(s): TROPONINI, PROBNP in the last 168 hours.  Glucose  Recent Labs Lab 09/26/16 1612 09/26/16 2108 09/27/16 0743 09/27/16 1154 09/27/16 1627 09/27/16 2136  GLUCAP 147* 130* 76 98 100* 121*  Imaging No results found.   CULTURES:  2d ago  Specimen Description URINE, CLEAN CATCH   Special Requests NONE   Culture  (A)  >=100,000 COLONIES/mL GRAM NEGATIVE RODS> =100,000 COLONIES/mL ENTEROCOCCUS FAECALIS      ANTIBIOTICS: Zosyn  LINES/TUBES: PIV, foley, Og, ETT  DISCUSSION: 60 year old male with history HTN, Hyperlipidemia, morbid obesity, presents with pain in right hip, became agitated, hypercapnic with resulting hypercapnic respiratory arrest.  ASSESSMENT / PLAN:  PULMONARY A: Hypercapnic Respiratory failure ARDS Pulmonary Edema P:   Mechanical ventilation with elevated PEEP strategy Obtain tracheal aspirate IV lasix  CARDIOVASCULAR A: History of heart failure, now with primary pulmonary arrest P:  Will trend troponins EKG ECHO IV lasix as blood pressure tolerates  RENAL A:  Acute on Chronic renal failure Given difficulty with urination and foley placement I am also concerned for obstructive uropathy in addition to ATN P:     Place foley now Urine lytes  GASTROINTESTINAL A:  Morbid obesity History of cirrhosis No additional GI acuities at this time P:   Place and maintain OG to intermittent suction Follow CMP and coags  HEMATOLOGIC A:  History of cirrhosis P:  Follow coags  INFECTIOUS A:  Enterococcus UTI, sensitivities not yet resulted P:   On Zosyn, Rifaximin, consider Linezolid if VRE  ENDOCRINE A:  History of DM P:   Monitor blood glucose and add Sliding Scale Insulin  NEUROLOGIC A:  Acute Encephalopathy. The etiology of his agitation is unclear to me at this time, though I am concerned that pain and sepsis 2/2 urinary source contributed. Alcohol withdrawal may also be playing a role P:   Add stat CT head  Manage pain with fentanyl gtt PRN versed ordered. If/When extubated, will order CIWA protocol Add ammonia level RASS goal: 0  FAMILY  - Updates: Robert updated. He would like to talk to the patient's son himself. He lives in [place] but would like to organize a trip here and a goals of care discussion. At this time, patient remains FULL CODE.   - Inter-disciplinary family meet or Palliative Care meeting due by:  day 2   The patient is critically ill with multiple organ system failure and requires high complexity decision making for assessment and support, frequent evaluation and titration of therapies, advanced monitoring, review of radiographic studies and interpretation of complex data.   Critical Care Time devoted to patient care services, exclusive of separately billable procedures, described in this note is 45 minutes.   Derinda Sis DO [facility] Pager: [phone]  09/28/2016, 3:33 AM

## 2016-09-28 NOTE — Anesthesia Procedure Notes (Signed)
Procedure Name: Intubation Date/Time: 09/28/2016 3:18 AM Performed by: Karrington Studnicka S Pre-anesthesia Checklist: Patient identified, Emergency Drugs available, Suction available, Patient being monitored and Timeout performed Oxygen Delivery Method: Ambu bag Preoxygenation: Pre-oxygenation with 100% oxygen Tube type: Subglottic suction tube Number of attempts: 1 Airway Equipment and Method: Video-laryngoscopy Placement Confirmation: ETT inserted through vocal cords under direct vision,  CO2 detector and breath sounds checked- equal and bilateral Secured at: 22 cm Tube secured with: Tape Dental Injury: Teeth and Oropharynx as per pre-operative assessment

## 2016-09-28 NOTE — Progress Notes (Signed)
While charting at bedside, noted a reduction in patient movement and respiratory effort.  Shallow ineffective  respirations noted. Due to abdominal girth, patient was unable to lay supine and had been left in a slight side lying position that allowed for the most effective ventilation.  His hands were  unrestrained, rolled onto his  back, code called, backboard placed.  No peripheral pulse was noted and chest compressions were initiated, Respiratory initiated manual ventilation.  Pulse and rhythm obtained after several minutes of compressions.  Intubated and transfer pending to ICU.  Critical care at bedside.  See code blue record for additional details.

## 2016-09-28 NOTE — Progress Notes (Signed)
Per NP Marni Griffon, RN is okay to use central line prior to xray verification due to emergency necessity of medication administration.

## 2016-09-28 NOTE — Progress Notes (Signed)
Family Medicine Teaching Service Social Note.  Appreciate excellent care from CCM. Will be happy to take over care once he is deemed stable.   Matthew [name] L. Rosalyn Gess, [place] Resident PGY-1 09/26/2016 10:18 PM

## 2016-09-28 NOTE — Progress Notes (Signed)
CRITICAL VALUE ALERT  Critical value received:  Lactic Acid 2.3  Date of notification:  09/28/16  Time of notification:  [id number]  Critical value read back:Yes.    Nurse who received alert:  Verlin Grills  MD notified (1st page):  Sood  Time of first page:  [id number]  No new orders received. Will continue to monitor.

## 2016-09-28 NOTE — Consult Note (Signed)
Subjective: I was asked to see Mr. Maneri in consultation by Dr. Halford Chessman for foley placement.   Patient is unable to provide history, but on review of the records he has had a recent enterococcal UTI.  He was admitted after a fall on the right hip and is now septic and on the vent.   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   ROS:  Review of Systems  Unable to perform ROS: Intubated    Allergies  Allergen Reactions  . Potassium-Containing Compounds Other (See Comments)    Reaction:  Stomach pain   . Cymbalta [Duloxetine Hcl]     Strange feeling in brain.  . Augmentin [Amoxicillin-Pot Clavulanate] Nausea Only and Other (See Comments)    Has patient had a PCN reaction causing immediate rash, facial/tongue/throat swelling, SOB or lightheadedness with hypotension: No Has patient had a PCN reaction causing severe rash involving mucus membranes or skin necrosis: No Has patient had a PCN reaction that required hospitalization No Has patient had a PCN reaction occurring within the last 10 years: Yes If all of the above answers are "NO", then may proceed with Cephalosporin use.  [name] Neomycin Rash    Past Medical History:  Diagnosis Date  . Atrial fibrillation, chronic ([place])   . CHF (congestive heart failure) ([place])   . GERD (gastroesophageal reflux disease)   . Hypertension   . Septic shock ([place])   . Shortness of breath dyspnea     Past Surgical History:  Procedure Laterality Date  . HERNIA REPAIR    . KNEE ARTHROSCOPY    . SPLENECTOMY    . TEE WITHOUT CARDIOVERSION N/A 05/21/2016   Procedure: TRANSESOPHAGEAL ECHOCARDIOGRAM (TEE);  Surgeon: Larey Dresser, MD;  Location: [facility];  Service: Cardiovascular;  Laterality: N/A;  . TONSILECTOMY, ADENOIDECTOMY, BILATERAL MYRINGOTOMY AND TUBES      Social History   Social History  . Marital status: Divorced    Spouse name: N/A  . Number of  children: N/A  . Years of education: N/A   Occupational History  . Not on file.   Social History Main Topics  . Smoking status: Former Smoker    Types: Cigarettes    Quit date: 11/01/1974  . Smokeless tobacco: Never Used     Comment: smoked in high school for about a year  . Alcohol use Yes  . Drug use: Unknown  . Sexual activity: Not Currently   Other Topics Concern  . Not on file   Social History Narrative  . No narrative on file    No family history on file.  Anti-infectives: Anti-infectives    Start     Dose/Rate Route Frequency Ordered Stop   09/27/16 1345  rifaximin (XIFAXAN) tablet 550 mg     550 mg Oral 2 times daily 09/27/16 1339     09/26/16 1400  piperacillin-tazobactam (ZOSYN) IVPB 3.375 g     3.375 g 12.5 mL/hr over 240 Minutes Intravenous Every 8 hours 09/26/16 1340     09/26/16 0530  cefTRIAXone (ROCEPHIN) 1 g in dextrose 5 % 50 mL IVPB     1 g 100 mL/hr over 30 Minutes Intravenous  Once 09/26/16 0519 09/26/16 1610      Current Facility-Administered Medications  Medication Dose Route Frequency Provider Last Rate Last Dose  . 0.9 %  sodium chloride infusion   Intravenous Continuous Elberta Leatherwood, MD 150 mL/hr at 09/28/16 1110    .  acetaminophen (TYLENOL) tablet 650 mg  650 mg Oral Q6H PRN Elberta Leatherwood, MD       Or  . acetaminophen (TYLENOL) suppository 650 mg  650 mg Rectal Q6H PRN Elberta Leatherwood, MD      . aspirin chewable tablet 81 mg  81 mg Per Tube Daily [name], NP   81 mg at 09/28/16 1054  . chlorhexidine gluconate (MEDLINE KIT) (PERIDEX) 0.12 % solution 15 mL  15 mL Mouth Rinse BID [name], NP   15 mL at 09/28/16 1053  . fentaNYL 2572mg in NS 2573m(1011mml) infusion-PREMIX  0-200 mcg/hr Intravenous Continuous AleNilda CalamityO 5 mL/hr at 09/28/16 0942 50 mcg/hr at 09/28/16 0942  . heparin injection 5,000 Units  5,000 Units Subcutaneous Q8H VinChesley MiresD   5,000 Units at 09/28/16 1303  . insulin aspart (novoLOG) injection  0-9 Units  0-9 Units Subcutaneous Q4H PetErick ColaceP   1 Units at 09/28/16 1122  . lactulose (CHRONULAC) 10 GM/15ML solution 30 g  30 g Oral TID TodBlane OharaDiarmid, MD   30 g at 09/28/16 1054  . MEDLINE mouth rinse  15 mL Mouth Rinse QID PetErick ColaceP   15 mL at 09/28/16 1304  . norepinephrine (LEVOPHED) 16 mg in dextrose 5 % 250 mL (0.064 mg/mL) infusion  0-40 mcg/min Intravenous Titrated PetErick ColaceP 37.5 mL/hr at 09/28/16 1342 40 mcg/min at 09/28/16 1342  . nystatin (MYCOSTATIN/NYSTOP) topical powder   Topical TID IanElberta LeatherwoodD      . pantoprazole (PROTONIX) injection 40 mg  40 mg Intravenous QHS Alexis ChrLum KeasO      . phenylephrine (NEO-SYNEPHRINE) 10 mg in dextrose 5 % 250 mL (0.04 mg/mL) infusion  30-200 mcg/min Intravenous Continuous EliColbert CoyerD   Stopped at 09/28/16 [id number] piperacillin-tazobactam (ZOSYN) IVPB 3.375 g  3.375 g Intravenous Q8H Todd D McDiarmid, MD 12.5 mL/hr at 09/28/16 1419 3.375 g at 09/28/16 1419  . polyethylene glycol (MIRALAX / GLYCOLAX) packet 17 g  17 g Oral Daily PRN IanElberta LeatherwoodD      . rifaximin (XIDoreene Nestablet 550 mg  550 mg Oral BID TodBlane OharaDiarmid, MD   550 mg at 09/28/16 1055  . sodium chloride flush (NS) 0.9 % injection 10-40 mL  10-40 mL Intracatheter Q12H RobCollene GobbleD      . sodium chloride flush (NS) 0.9 % injection 10-40 mL  10-40 mL Intracatheter PRN RobCollene GobbleD      . vasopressin (PITRESSIN) 40 Units in sodium chloride 0.9 % 250 mL (0.16 Units/mL) infusion  0.03 Units/min Intravenous Continuous PetErick ColaceP 11.3 mL/hr at 09/28/16 1245 0.03 Units/min at 09/28/16 1245   Past medical, surgical, family and social history reviewed in chart.    Objective: Vital signs in last 24 hours: Temp:  [97.6 F (36.4 C)-98.9 F (37.2 C)] 98.5 F (36.9 C) (12/31 1200) Pulse Rate:  [68-98] 87 (12/31 1500) Resp:  [9-30] 30 (12/31 1500) BP: (69-131)/(42-63) 98/44 (12/31 1330) SpO2:  [72 %-100 %] 92 %  (12/31 1500) FiO2 (%):  [60 %-70 %] 70 % (12/31 1330) Weight:  [193 kg (425 lb 7.8 oz)] 193 kg (425 lb 7.8 oz) (12/31 0400)  Intake/Output from previous day: 12/30 0701 - 12/31 0700 In: 151 [P.O.:1; IV Piggyback:150] Out: 30 [Urine:30] Intake/Output this shift: No intake/output data recorded.   Physical Exam  Constitutional:  Morbidly Obese,  WD WM intubated on vent.  Abdominal:  Morbidly obese with massive indurated panniculus.   Genitourinary:  Genitourinary Comments: Moderate scrotal edema with a deeply buried penis but with minimal phimosis.  Scrotal contents unremarkable.   Unable to do rectal.   Musculoskeletal: He exhibits edema (brawny edema of the legs.).  Neurological:  Intubated on vent but moves all 4 with stimulus.  Skin: Skin is warm and dry. There is erythema (mild scrotal).    Lab Results:   Recent Labs  09/27/16 0533 09/28/16 0451  WBC 10.4 19.8*  HGB 14.2 14.2  HCT 40.6 41.9  PLT 358 355   BMET  Recent Labs  09/28/16 0451 09/28/16 1130  NA 129* 128*  K 5.3* 4.6  CL 98* 99*  CO2 21* 21*  GLUCOSE 113* 128*  BUN 29* 31*  CREATININE 2.94* 2.94*  CALCIUM 8.5* 8.0*   PT/INR  Recent Labs  09/26/16 1648 09/28/16 0451  LABPROT 17.2* 19.1*  INR 1.39 1.59   ABG  Recent Labs  09/28/16 0906 09/28/16 1442  PHART 7.222* 7.157*  HCO3 17.9* 20.0    Studies/Results: Dg Chest Port 1 View  Result Date: 09/28/2016 CLINICAL DATA:  Encounter for central line placement. EXAM: PORTABLE CHEST 1 VIEW COMPARISON:  09/28/2016 at 3:31 a.m. FINDINGS: New left internal jugular central venous line has its tip projecting at the confluence of the left brachiocephalic vein and superior vena cava. There is no pneumothorax. Endotracheal tube is stable and well positioned. Mild increased opacity noted at the left lung base when compared to the earlier study, likely atelectasis with a small effusion. Right pleural effusion is stable. IMPRESSION: 1. Left internal  jugular central venous line tip projects at the confluence of the left brachiocephalic vein and superior vena cava. 2. No pneumothorax. 3. Mild increased opacity at the left lung base from the earlier study likely combination a of small effusion and atelectasis. No other change. Electronically Signed   By: Lajean Manes M.D.   On: 09/28/2016 09:38   Dg Chest Port 1 View  Result Date: 09/28/2016 CLINICAL DATA:  Respiratory distress.  Intubation. EXAM: PORTABLE CHEST 1 VIEW COMPARISON:  06/01/2016 FINDINGS: Endotracheal tube just distal to the thoracic inlet, cross suboptimally defined. Enteric tube in place, tip below the diaphragm, side-port not well visualized. The heart is enlarged. Moderate right pleural effusion, circumferential with right basilar opacity. Probable volume loss in the right hemithorax versus rotation. Bilateral perihilar opacities are suspicious for pulmonary edema. No left pleural effusion. IMPRESSION: 1. Endotracheal and enteric tubes in place. 2. Cardiomegaly and pulmonary edema. 3. Increased size of right pleural effusion with right basilar opacity, likely atelectasis. Questionable volume loss in the right lung versus rotation. Electronically Signed   By: Jeb Levering M.D.   On: 09/28/2016 04:07   Labs and recent hospital notes and prior records reviewed.  Procedure:  The genitalia was prepped with betadine and sterile towels were placed.  Two nurses were required to lift the panniculus to expose the scrotum and buried penis prior to the prep.  KY was used as the lubricant.  A 18 fr council catheter was placed after initial attempts and 16 fr foley placement were unsuccessful.  I had to use a cystoscopy to visualize the meatus and insert a wire for use of urethral dilators to 61f prior to insertion of the 124fcouncil catheter.  The balloon was inflated with 1057mnd he promptly drained several hundred ml of concentrated urine.   A specimen was  sent for culture.    Assessment: 1.  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   Foley placed with cystoscopy and meatal dilation.   2.  ARF with retention.  Will need voiding trial when recovered from acute episode.  Please reconsult prn.     CC: Dr. Chesley Mires     Irine Seal J 09/28/2016 [phone]

## 2016-09-28 NOTE — Progress Notes (Signed)
Dr. Johnette Abraham. Deterding notified of BP, orders placed

## 2016-09-28 NOTE — Progress Notes (Signed)
RT assisted ICU physician with ETT exchange. Cuff was blown. Patient tolerating well. ETCO2 positive color change. Bilateral breath sounds present. CXR pending.

## 2016-09-28 NOTE — Progress Notes (Signed)
Notified Dr. Tamala Julian of pts  BP, orders received

## 2016-09-28 NOTE — Progress Notes (Signed)
Dr. Jimmy Footman, E, notified of lactic acid

## 2016-09-28 NOTE — Progress Notes (Signed)
eLink Physician-Brief Progress Note Patient Name: Matthew [name] DOB: [birth year] MRN: [id number]   Date of Service  09/28/2016  HPI/Events of Note  Multiple issues: 1. Patient bit through EET >> leak, 2. Hypotension - CVP = 17.  eICU Interventions  Will order: 1. Dr. Sharen Heck to evaluate ETT at bedside.  2. Increase the ceiling on Norepinephrine IV infusion to 60 mcg/min. 3. ABG now.      Intervention Category Intermediate Interventions: Respiratory distress - evaluation and management;Hypotension - evaluation and management  Lysle Dingwall 09/28/2016, 8:37 PM

## 2016-09-28 NOTE — Progress Notes (Signed)
RT entered room to place patient on CPAP. Patient is confused and agitated at this time. CPAP in room but not placed on patient at this time. RN aware.

## 2016-09-28 NOTE — Procedures (Signed)
Arterial Catheter Insertion Procedure Note JACON SCHAAB [id number] [birth year]  Procedure: Insertion of Arterial Catheter  Indications: Blood pressure monitoring  Procedure Details Consent: Unable to obtain consent because of emergent medical necessity. Time Out: Verified patient identification, verified procedure, site/side was marked, verified correct patient position, special equipment/implants available, medications/allergies/relevent history reviewed, required imaging and test results available.  Performed  Maximum sterile technique was used including antiseptics, cap, gloves, gown, hand hygiene, mask and sheet. Skin prep: Chlorhexidine; local anesthetic administered 20 gauge catheter was inserted into left radial artery using the Seldinger technique.  Evaluation Blood flow good; BP tracing good. Complications: No apparent complications.   Tecla Mailloux, Dub Mikes 09/28/2016

## 2016-09-28 NOTE — Progress Notes (Signed)
Patient becoming increasing agitated.   Cooperative upon request but quickly reverts back. Attempting to get out of bed, pulled out Iv.  Skin tears noted on bilateral legs and arms from patient thrashing in bed.  Calling for people who are not here, but is oriented to self.IV disconnected to keep patient from tying the tubing around his arm.  No sitter available at this time

## 2016-09-28 NOTE — Procedures (Signed)
Central Venous Catheter Insertion Procedure Note Matthew [name] [id number] 16-Mar-1956  Procedure: Insertion of Central Venous Catheter Indications: Assessment of intravascular volume, Drug and/or fluid administration and Frequent blood sampling  Procedure Details Consent: Unable to obtain consent because of emergent medical necessity. Time Out: Verified patient identification, verified procedure, site/side was marked, verified correct patient position, special equipment/implants available, medications/allergies/relevent history reviewed, required imaging and test results available.  Performed Real time [place] used to ID and cannulate vessel  Maximum sterile technique was used including antiseptics, cap, gloves, gown, hand hygiene, mask and sheet. Skin prep: Chlorhexidine; local anesthetic administered A antimicrobial bonded/coated triple lumen catheter was placed in the left internal jugular vein using the Seldinger technique.  Evaluation Blood flow good Complications: No apparent complications Patient did tolerate procedure well. Chest X-ray ordered to verify placement.  CXR: pending.  Matthew [name] 09/28/2016, 8:48 AM  [name] ACNP-BC [place] Pager # [phone] OR # [phone] if no answer

## 2016-09-28 NOTE — Progress Notes (Signed)
PT Cancellation Note  Patient Details Name: CHIPPER GAILLARD MRN: [id number] DOB: [birth year]   Cancelled Treatment:    Reason Eval/Treat Not Completed: Medical issues which prohibited therapy.  Events of last pm noted.  Received discontinue orders for PT.  PT will sign off.  Please re-order when appropriate for this patient.   Despina Pole 09/28/2016, 10:15 AM Carita Pian. Sanjuana Kava, [place] Pager [phone]

## 2016-09-28 NOTE — Consult Note (Signed)
PULMONARY / CRITICAL CARE MEDICINE     ADMISSION DATE:  09/24/2016 CONSULTATION DATE:  09/28/2016 REFERRING MD:  Dr. McDiarmid, FPTS  CHIEF COMPLAINT:  CODE BLUE  HISTORY OF PRESENT ILLNESS:   60 yo male presented with Rt hip and lower back pain after fall 3 weeks prior to admission.  Also concern for UTI, Rt leg cellulitis, AKI.  PMHx of ETOH cirrhosis, morbid obesity, DM, HTN, A fib, OSA, CKD.  Developed altered mental status, then respiratory arrest 12/31 leading to cardiac arrest with ROSC in 4 minutes.  Developed MODS after this event.  SUBJECTIVE: Remains on full vent support, pressors.  VITAL SIGNS: BP (!) 69/43   Pulse 70   Temp 98.7 F (37.1 C) (Oral)   Resp (!) 24   Wt (!) 425 lb 7.8 oz (193 kg)   SpO2 98%   BMI 61.05 kg/m   INTAKE / OUTPUT: I/O last 3 completed shifts: In: 151 [P.O.:1; IV Piggyback:150] Out: 480 [Urine:480]  VITAL SIGNS: Temp:  [97.6 F (36.4 C)-98.7 F (37.1 C)] 98.7 F (37.1 C) (12/31 0150) Pulse Rate:  [69-98] 70 (12/31 0700) Resp:  [18-24] 24 (12/31 0700) BP: (69-131)/(42-79) 69/43 (12/31 0700) SpO2:  [72 %-100 %] 98 % (12/31 0700) FiO2 (%):  [60 %] 60 % (12/31 0530) Weight:  [425 lb 7.8 oz (193 kg)] 425 lb 7.8 oz (193 kg) (12/31 0400)   General Intubated, not responsive to verbal command  HEENT No gross abnormalities. Orally intubated   Pulmonary Diffuse rhonchi. No wheezing. Thick pink secretions suctioned from ETT, decreased on left  Cardiovascular Normal rate, regular rhythm. S1, s2.  Abdomen Obese abdomen,Soft, non-tender, non-distended, positive bowel sounds, massive panus   Neurologic Intubated, not responsive to verbal command, non responsive to sternal rub, no purposeful movements of extremities  Skin/Integuement No rash, no cyanosis, no clubbing. LE red. Chronic appearing erythremia     LABS:  BMET  Recent Labs Lab 09/27/16 0533 09/28/16 0245 09/28/16 0451  NA 130* 128* 129*  K 3.9 5.7* 5.3*  CL 98* 97* 98*   CO2 22 19* 21*  BUN 24* 30* 29*  CREATININE 2.27* 2.73* 2.94*  GLUCOSE 81 120* 113*    Electrolytes  Recent Labs Lab 09/26/16 1141 09/27/16 0533 09/28/16 0245 09/28/16 0451  CALCIUM 8.8* 8.4* 8.7* 8.5*  MG 2.2  --   --  2.5*  PHOS  --   --   --  7.9*    CBC  Recent Labs Lab 09/26/16 1141 09/27/16 0533 09/28/16 0451  WBC 10.2 10.4 19.8*  HGB 15.0 14.2 14.2  HCT 42.2 40.6 41.9  PLT 417* 358 355    Coag's  Recent Labs Lab 09/26/16 1648 09/28/16 0451  APTT 35 47*  INR 1.39 1.59    Sepsis Markers  Recent Labs Lab 09/28/16 0451  LATICACIDVEN 3.9*    ABG  Recent Labs Lab 09/28/16 0240 09/28/16 0512  PHART 7.053* 7.212*  PCO2ART 75.7* 46.8  PO2ART 117* 94.0    Liver Enzymes  Recent Labs Lab 09/26/16 0045 09/28/16 0451  AST 36 75*  ALT 21 35  ALKPHOS 183* 179*  BILITOT 1.1 1.5*  ALBUMIN 2.5* 2.1*    Cardiac Enzymes  Recent Labs Lab 09/28/16 0451  TROPONINI 0.03*    Glucose  Recent Labs Lab 09/26/16 1612 09/26/16 2108 09/27/16 0743 09/27/16 1154 09/27/16 1627 09/27/16 2136  GLUCAP 147* 130* 76 98 100* 121*    Imaging Dg Chest Port 1 View  Result Date: 09/28/2016 CLINICAL DATA:  Respiratory distress.  Intubation. EXAM: PORTABLE CHEST 1 VIEW COMPARISON:  06/01/2016 FINDINGS: Endotracheal tube just distal to the thoracic inlet, cross suboptimally defined. Enteric tube in place, tip below the diaphragm, side-port not well visualized. The heart is enlarged. Moderate right pleural effusion, circumferential with right basilar opacity. Probable volume loss in the right hemithorax versus rotation. Bilateral perihilar opacities are suspicious for pulmonary edema. No left pleural effusion. IMPRESSION: 1. Endotracheal and enteric tubes in place. 2. Cardiomegaly and pulmonary edema. 3. Increased size of right pleural effusion with right basilar opacity, likely atelectasis. Questionable volume loss in the right lung versus rotation.  Electronically Signed   By: Jeb Levering M.D.   On: 09/28/2016 04:07    CULTURES: UC 112/29: Enetococcus faecalis and Citrobacter   Studies Doppler Rt leg 12/30 >> no DVT, technically limited study Renal US 12/31 >>> CT head 12/31 >>>  ANTIBIOTICS: Zosyn 12/29 >>>  LINES/TUBES: ETT 12/31>>> Left IJ CVL 12/31>>> Left radial aline 12/31>>>  DISCUSSION: 60 yo male admitted with hip/back pain, UTI, Rt leg cellulitis.  Developed MODS with septic shock after respiratory leading to cardiac arrest.  ASSESSMENT / PLAN:  Acute Hypercapnic Respiratory failure Progressive Right sided atx vs PNA +/- effusion Evolving ARDS P:   Mechanical ventilation with elevated PEEP strategy Obtain tracheal aspirate PAD protocol  History of heart failure, now with primary pulmonary arrest Septic shock/MODS P:  Will trend troponins EKG ECHO Hold antihypertensives Repeating 1 liter fluid challenge Central line placed. Will aim for CVP 8-12 MAP goal > 65. Have maximized Neo; will add Levophed.  Ensure Ph >7.2 Assess clear track Stroke volume response   Acute on Chronic renal failure Hyperkalemia  Lactic acidosis  Hyponatremia  Given difficulty with urination and foley placement I am also concerned for obstructive uropathy in addition to ATN (urology consult placed) P:   Place foley now F/u serial chemistries strick I&O Maximize CO Renal dose meds  Renal US  Stop diuretics Dc K  Morbid obesity History of cirrhosis No additional GI acuities at this time P:   Place and maintain OG to intermittent suction Follow CMP and coags  History of cirrhosis P:  Follow coags  Enterococcus faecalis and Citrobacter UTI Possible cellulitis  R/o PNA P:   On Zosyn, Rifaximin Add vanc (cover for cellulitis)  History of DM P:   Monitor blood glucose and add Sliding Scale Insulin  Acute metabolic encephalopathy. Hx of ETOH. P:   F/ u CT head Sedation w/ fent gtt.  Would add  precedex next will try to avoid benzos w/ h/o cirrhosis  f/u ammonia level RASS goal: 0  FAMILY  - Updates: Matthew [name]. He would like to talk to the Matthew [name] himself. He lives in [place] but would like to organize a trip here and a goals of care discussion. At this time, patient remains FULL CODE.   My critical care time75 minutes.   [name] ACNP-BC [place] Pager # [phone] OR # [phone] if no answer  09/28/2016, 8:15 AM  Developed agitation.  Hx of ETOH with cirrhosis.  Received several doses of ativan.  Developed hypercapnia/hypoxia >> respiratory leading to cardiac arrest.  Septic shock with MODS.  Obese.  Decreased BS.  HR irregular.  Massive pannus.  Lower legs red.  LA 2.3, WBC 19.8, Na 129, Creatinine 2.94  Assessment/plan:  Acute on chronic respiratory failure. - full vent support  AKI. - urology to place foley - f/u renal function - defer nephrology  consult for now  Septic shock, UTI, cellulitis. - pressors to keep MAP > 65 - continue Abx  ETOH with cirrhosis. - thiamine, folic acid  Acute metabolic encephalopathy. - monitor mental status  Goals of care - full code.  Pt's [name] reportedly arrest on 12/31 for DUI in [place].    CC time by me independent of APP and procedure time is 38 minutes  Chesley Mires, MD [place] 09/28/2016, 11:54 AM Pager:  [phone] After 3pm call: [phone]

## 2016-09-28 NOTE — Progress Notes (Signed)
Attempted to place Foley catheter with multiple assist. Unable to get catheter tip into meatus. The meatus appears to be adhered together. Pericare was performed with a thick layer of a cheesy white coating noted on tip of penis. While attempting to place foley, white exudate drained out of small meatus opening. Dr. Jimmy Footman notified. Awaiting Coude team to attempt to place a coude prior to urology consult. It was reported that patient had a large incont episode of urine prior to arriving to 4N. Primary nurse present at bedside and aware.

## 2016-09-28 NOTE — Progress Notes (Signed)
Notified Dr. Jimmy Footman, E. Of pt BP, orders received

## 2016-09-28 NOTE — Progress Notes (Signed)
4W and 6N coude team notified of need for placement. Neither unit able to attempt placement at this time. Dr. Jimmy Footman aware.

## 2016-09-28 NOTE — Progress Notes (Signed)
Received report on patient from outgoing nurse at [facility].  He was reported as agitated and restless.  The patient was throwing himself from side to side in the bed and was able to answerer simple questions but lucidity waxed and waned.  Outgoing nurse stated that this was an improvement over pre ativan behavior.  IV ativan had been administered 1 1/2 hours prior to report.    Respiratory therapy entered room and patient refused bipap.  Instructed to call respiratory if patient changed his mind.  Vital signs obtained.  Oxygen saturations reported as 67% on room air.  Due to patients thrashing around in the bed, a repeat saturation was impossible to obtain.  He was yelling that he needed to urinate and an unsuccessful attempt at catherization was tried.  With additional staff, a repeat oxygen saturation was obtained with an adequate waveform.  On room air, sats wee 74-77%.  Bipap applied but patient tore it off.  4 staff members were needed to restrain his arms and body.  Rapid response and MD called.  100% nonrebreathing mask applied and sats rose to 100%.  MD arrived at bedside and restraints were applied.  Labs obtained and 2 additional mg of ativan given.  There was a mild reduction in patients agitation.  MD left room to write orders.  Request for stepdawn or ICU bed requested from MD.    ABG results reported to MD.  Critical care consult requested.

## 2016-09-28 NOTE — Significant Event (Signed)
Rapid Response Event Note RN called about ongoing agitation, no change with new ativan 1 mg IVP order.  Overview: Time Called: 0149 Arrival Time: 0152 Event Type: Other (Comment) (new onset agitation)  Initial Focused Assessment: On arrival pt thrashing all over bed, on a NRB, alert but confused, not answering any questions, responds to painful stimulus. Pt's O2 sats were 66% RA. Amin MD paged PTA, arrived to bedside spoke with Dr. Avon Gully who came to bedside. New orders placed for PRN dose of Ativan IVP, bilateral wrist restraints, and  . Bedside rn called out into hallway concerned for decreased respirations. Pt found not breathing with no pulse, chest compressions started, code blue called, ACLS protocol carried out, please see code blue record for additional information. Pt intubated transferred to 4N30  Interventions: ABG obtained yielding a Ph 7.053, CO2 75.7, O2 117, Bicarb 20.1. Ativan 2 mg IVP given. CCM consulted.    Event Summary: Name of Physician Notified: Amin MD at  (Prior to RRT arrival )    at    Outcome: Coded and survived, Transferred (Comment) (4N30)  Event End Time: [place], [place]

## 2016-09-28 NOTE — Progress Notes (Signed)
eLink Physician-Brief Progress Note Patient Name: FABYAN KER DOB: [birth year] MRN: [id number]   Date of Service  09/28/2016  HPI/Events of Note  Persistent hypotension despite fluid boluses.  Current BP 66/35 (45)  eICU Interventions  NEO gtt initiated for BP support.  May need CVL      Intervention Category Major Interventions: Hypotension - evaluation and management  Tilley Faeth 09/28/2016, 6:47 AM

## 2016-09-28 NOTE — Progress Notes (Signed)
Pt 's A-line connected to flow trac.  Monitored by nurse and RT

## 2016-09-29 ENCOUNTER — Inpatient Hospital Stay (HOSPITAL_COMMUNITY): Payer: Medicare Other

## 2016-09-29 DIAGNOSIS — J9622 Acute and chronic respiratory failure with hypercapnia: Secondary | ICD-10-CM

## 2016-09-29 DIAGNOSIS — J80 Acute respiratory distress syndrome: Secondary | ICD-10-CM

## 2016-09-29 DIAGNOSIS — J9621 Acute and chronic respiratory failure with hypoxia: Secondary | ICD-10-CM

## 2016-09-29 LAB — MAGNESIUM
MAGNESIUM: 2.3 mg/dL (ref 1.7–2.4)
Magnesium: 2.5 mg/dL — ABNORMAL HIGH (ref 1.7–2.4)

## 2016-09-29 LAB — LACTIC ACID, PLASMA
Lactic Acid, Venous: 2.2 mmol/L (ref 0.5–1.9)
Lactic Acid, Venous: 2.2 mmol/L (ref 0.5–1.9)

## 2016-09-29 LAB — BLOOD GAS, ARTERIAL
Acid-base deficit: 5.9 mmol/L — ABNORMAL HIGH (ref 0.0–2.0)
Bicarbonate: 18.5 mmol/L — ABNORMAL LOW (ref 20.0–28.0)
Drawn by: 313941
FIO2: 60
LHR: 34 {breaths}/min
O2 Saturation: 98.6 %
PEEP: 8 cmH2O
PO2 ART: 125 mmHg — AB (ref 83.0–108.0)
Patient temperature: 98
VT: 510 mL
pCO2 arterial: 32.9 mmHg (ref 32.0–48.0)
pH, Arterial: 7.367 (ref 7.350–7.450)

## 2016-09-29 LAB — POCT I-STAT 3, ART BLOOD GAS (G3+)
ACID-BASE DEFICIT: 11 mmol/L — AB (ref 0.0–2.0)
Acid-base deficit: 8 mmol/L — ABNORMAL HIGH (ref 0.0–2.0)
Bicarbonate: 18.7 mmol/L — ABNORMAL LOW (ref 20.0–28.0)
Bicarbonate: 19.7 mmol/L — ABNORMAL LOW (ref 20.0–28.0)
O2 SAT: 93 %
O2 Saturation: 99 %
PCO2 ART: 39.3 mmHg (ref 32.0–48.0)
PCO2 ART: 61.9 mmHg — AB (ref 32.0–48.0)
PH ART: 7.284 — AB (ref 7.350–7.450)
PH ART: 7.111 — AB (ref 7.350–7.450)
Patient temperature: 98.6
TCO2: 20 mmol/L (ref 0–100)
TCO2: 22 mmol/L (ref 0–100)
pO2, Arterial: 159 mmHg — ABNORMAL HIGH (ref 83.0–108.0)
pO2, Arterial: 90 mmHg (ref 83.0–108.0)

## 2016-09-29 LAB — BASIC METABOLIC PANEL
Anion gap: 10 (ref 5–15)
BUN: 37 mg/dL — ABNORMAL HIGH (ref 6–20)
CALCIUM: 8.2 mg/dL — AB (ref 8.9–10.3)
CHLORIDE: 99 mmol/L — AB (ref 101–111)
CO2: 20 mmol/L — ABNORMAL LOW (ref 22–32)
CREATININE: 3.42 mg/dL — AB (ref 0.61–1.24)
GFR calc non Af Amer: 18 mL/min — ABNORMAL LOW (ref >60)
GFR, EST AFRICAN AMERICAN: 21 mL/min — AB (ref >60)
Glucose, Bld: 149 mg/dL — ABNORMAL HIGH (ref 65–99)
Potassium: 4.7 mmol/L (ref 3.5–5.1)
SODIUM: 129 mmol/L — AB (ref 135–145)

## 2016-09-29 LAB — GLUCOSE, CAPILLARY
GLUCOSE-CAPILLARY: 138 mg/dL — AB (ref 65–99)
GLUCOSE-CAPILLARY: 142 mg/dL — AB (ref 65–99)
GLUCOSE-CAPILLARY: 149 mg/dL — AB (ref 65–99)
GLUCOSE-CAPILLARY: 154 mg/dL — AB (ref 65–99)
GLUCOSE-CAPILLARY: 163 mg/dL — AB (ref 65–99)
Glucose-Capillary: 147 mg/dL — ABNORMAL HIGH (ref 65–99)

## 2016-09-29 LAB — URINE CULTURE: Culture: NO GROWTH

## 2016-09-29 LAB — PROCALCITONIN: PROCALCITONIN: 9.2 ng/mL

## 2016-09-29 LAB — PHOSPHORUS
PHOSPHORUS: 6 mg/dL — AB (ref 2.5–4.6)
PHOSPHORUS: 4.6 mg/dL (ref 2.5–4.6)

## 2016-09-29 LAB — TRIGLYCERIDES: Triglycerides: 117 mg/dL (ref <150)

## 2016-09-29 MED ORDER — CISATRACURIUM BESYLATE (PF) 200 MG/20ML IV SOLN
3.0000 ug/kg/min | INTRAVENOUS | Status: DC
  Filled 2016-09-29: qty 20

## 2016-09-29 MED ORDER — FENTANYL CITRATE (PF) 100 MCG/2ML IJ SOLN: 100.0000 ug | Freq: Once | INTRAMUSCULAR | Status: DC

## 2016-09-29 MED ORDER — MIDAZOLAM HCL 2 MG/2ML IJ SOLN
2.0000 mg | INTRAMUSCULAR | Status: DC | PRN
Start: 2016-09-29 — End: 2016-10-03

## 2016-09-29 MED ORDER — FENTANYL BOLUS VIA INFUSION
50.0000 ug | INTRAVENOUS | Status: DC | PRN
  Filled 2016-09-29: qty 50

## 2016-09-29 MED ORDER — PROPOFOL 1000 MG/100ML IV EMUL
0.0000 ug/kg/min | INTRAVENOUS | Status: DC
  Administered 2016-09-29: 20 ug/kg/min via INTRAVENOUS
  Administered 2016-09-29 (×4): 30 ug/kg/min via INTRAVENOUS
  Administered 2016-09-29: 25 ug/kg/min via INTRAVENOUS
  Administered 2016-09-30: 15 ug/kg/min via INTRAVENOUS
  Administered 2016-09-30 (×4): 20 ug/kg/min via INTRAVENOUS
  Administered 2016-10-01: 40 ug/kg/min via INTRAVENOUS
  Administered 2016-10-01: 20 ug/kg/min via INTRAVENOUS
  Administered 2016-10-01: 5 ug/kg/min via INTRAVENOUS
  Administered 2016-10-01 (×2): 40 ug/kg/min via INTRAVENOUS
  Administered 2016-10-01: 20.017 ug/kg/min via INTRAVENOUS
  Administered 2016-10-02 – 2016-10-03 (×4): 10 ug/kg/min via INTRAVENOUS
  Filled 2016-09-29 (×22): qty 100

## 2016-09-29 MED ORDER — HYDROCORTISONE NA SUCCINATE PF 100 MG IJ SOLR
50.0000 mg | Freq: Four times a day (QID) | INTRAMUSCULAR | Status: DC
  Administered 2016-09-29 – 2016-09-30 (×5): 50 mg via INTRAVENOUS
  Filled 2016-09-29 (×5): qty 2

## 2016-09-29 MED ORDER — PRO-STAT SUGAR FREE PO LIQD
30.0000 mL | Freq: Two times a day (BID) | ORAL | Status: DC
  Administered 2016-09-29 – 2016-09-30 (×3): 30 mL
  Filled 2016-09-29 (×3): qty 30

## 2016-09-29 MED ORDER — PANTOPRAZOLE SODIUM 40 MG PO PACK
40.0000 mg | PACK | Freq: Every day | ORAL | Status: DC
  Administered 2016-09-29 – 2016-10-02 (×4): 40 mg
  Filled 2016-09-29 (×5): qty 20

## 2016-09-29 MED ORDER — INSULIN ASPART 100 UNIT/ML ~~LOC~~ SOLN
0.0000 [IU] | SUBCUTANEOUS | Status: DC
  Administered 2016-09-29 (×2): 4 [IU] via SUBCUTANEOUS
  Administered 2016-09-29 – 2016-09-30 (×3): 3 [IU] via SUBCUTANEOUS
  Administered 2016-09-30 (×3): 4 [IU] via SUBCUTANEOUS
  Administered 2016-09-30 – 2016-10-03 (×13): 3 [IU] via SUBCUTANEOUS

## 2016-09-29 MED ORDER — FENTANYL 2500MCG IN NS 250ML (10MCG/ML) PREMIX INFUSION
25.0000 ug/h | INTRAVENOUS | Status: DC
  Administered 2016-09-29: 150 ug/h via INTRAVENOUS
  Administered 2016-09-29: 200 ug/h via INTRAVENOUS
  Administered 2016-09-30 – 2016-10-01 (×2): 100 ug/h via INTRAVENOUS
  Administered 2016-10-02 – 2016-10-03 (×2): 150 ug/h via INTRAVENOUS
  Filled 2016-09-29 (×6): qty 250

## 2016-09-29 MED ORDER — VITAL HIGH PROTEIN PO LIQD
1000.0000 mL | ORAL | Status: DC
  Administered 2016-09-29 – 2016-09-30 (×2): 1000 mL
  Filled 2016-09-29 (×3): qty 1000

## 2016-09-29 MED ORDER — HEPARIN SODIUM (PORCINE) 5000 UNIT/ML IJ SOLN
5000.0000 [IU] | Freq: Three times a day (TID) | INTRAMUSCULAR | Status: DC
  Administered 2016-09-29 – 2016-09-30 (×5): 5000 [IU] via SUBCUTANEOUS
  Filled 2016-09-29 (×4): qty 1

## 2016-09-29 MED ORDER — CISATRACURIUM BOLUS VIA INFUSION
10.0000 mg | Freq: Once | INTRAVENOUS | Status: DC
  Filled 2016-09-29: qty 10

## 2016-09-29 MED ORDER — FENTANYL CITRATE (PF) 100 MCG/2ML IJ SOLN: 100.0000 ug | Freq: Once | INTRAMUSCULAR | Status: DC | PRN

## 2016-09-29 MED ORDER — ARTIFICIAL TEARS OP OINT: 1.0000 "application " | TOPICAL_OINTMENT | Freq: Three times a day (TID) | OPHTHALMIC | Status: DC

## 2016-09-29 NOTE — Progress Notes (Signed)
Pt restraints discontinued at 1430. Pt no longer interfering with care. Will continue to monitor closely.   Shelba Flake, RN

## 2016-09-29 NOTE — Final Consult Note (Signed)
Consultant Final Sign-Off Note    Assessment/Final recommendations  Matthew [name] is a 61 y.o. 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   His catheter is draining well.    He will need a voiding trial when he is off the vent and able to sit up.     Wound care (if applicable):    Diet at discharge: per primary team   Activity at discharge: per primary team   Follow-up appointment:  He should contact the office after discharge for a f/u appt in 2-3 weeks post discharge.    Pending results:  [employer]     Ordered   10/02/16 0500  Triglycerides  (propofol (DIPRIVAN))  Every 72 hours,   R    Comments:  While on propofol (DIPRIVAN)   Question:  Specimen collection method  Answer:  Unit=Unit collect   09/29/16 0853   09/30/16 0500  Comprehensive metabolic panel  Tomorrow morning,   R    Question:  Specimen collection method  Answer:  Unit=Unit collect   09/29/16 0843   09/30/16 0500  Protime-INR  Tomorrow morning,   R    Question:  Specimen collection method  Answer:  Unit=Unit collect   09/29/16 0843   09/30/16 0500  CBC  Tomorrow morning,   R    Question:  Specimen collection method  Answer:  Unit=Unit collect   09/29/16 0843   09/30/16 0500  Blood gas, arterial  Tomorrow morning,   R     09/29/16 0843   09/29/16 1100  Blood gas, arterial  Once,   R     09/29/16 0843   09/29/16 0844  Magnesium  (ICU Tube Feeding: PEPuP )  5A & 5P,   R    Question:  Specimen collection method  Answer:  Unit=Unit collect   09/29/16 0843   09/29/16 0844  Phosphorus  (ICU Tube Feeding: PEPuP )  5A & 5P,   R    Question:  Specimen collection method  Answer:  Unit=Unit collect   09/29/16 0843   09/29/16 0500  Procalcitonin  Daily,   R    Question:  Specimen collection method  Answer:  Lab=Lab collect   09/28/16 1156   09/28/16 1619  Culture, Urine  Once,   R     09/28/16 1620       Medication recommendations:   Other  recommendations:    Thank you for allowing [place] to participate in the care of your patient!  Please consult [place] again if you have further needs for your patient.  Matthew [name] 09/29/2016 9:43 AM    Subjective     Objective  Vital signs in last 24 hours: Temp:  [97.9 F (36.6 C)-98.7 F (37.1 C)] 98.6 F (37 C) (01/01 0700) Pulse Rate:  [73-101] 86 (01/01 0800) Resp:  [9-30] 16 (01/01 0800) BP: (97-135)/(44-60) 123/60 (01/01 0800) SpO2:  [84 %-100 %] 91 % (01/01 0800) FiO2 (%):  [60 %-100 %] 60 % (01/01 0800) Weight:  [199 kg (438 lb 11.5 oz)] 199 kg (438 lb 11.5 oz) (01/01 0645)  General: Obese WM on vent. GU: Foley draining clear urine with good output.    Pertinent labs and Studies:  Recent Labs  09/26/16 1141 09/27/16 0533 09/28/16 0451  WBC 10.2 10.4 19.8*  HGB 15.0 14.2 14.2  HCT 42.2 40.6 41.9   BMET  Recent Labs  09/28/16 2133 09/29/16 0410  NA  129* 129*  K 5.1 4.7  CL 98* 99*  CO2 17* 20*  GLUCOSE 160* 149*  BUN 35* 37*  CREATININE 3.40* 3.42*  CALCIUM 8.1* 8.2*   No results for input(s): LABURIN in the last 72 hours. Results for orders placed or performed during the hospital encounter of 09/20/2016  Urine culture     Status: Abnormal (Preliminary result)   Collection Time: 09/26/16  4:58 AM  Result Value Ref Range Status   Specimen Description URINE, CLEAN CATCH  Final   Special Requests NONE  Final   Culture (A)  Final    >=100,000 COLONIES/mL CITROBACTER SPECIES >=100,000 COLONIES/mL ENTEROCOCCUS FAECALIS CULTURE REINCUBATED FOR BETTER GROWTH Performed at [facility]    Report Status PENDING  Incomplete   Organism ID, Bacteria CITROBACTER SPECIES (A)  Final      Susceptibility   Citrobacter species - MIC*    CEFAZOLIN >=64 RESISTANT Resistant     CEFTRIAXONE <=1 SENSITIVE Sensitive     CIPROFLOXACIN <=0.25 SENSITIVE Sensitive     GENTAMICIN <=1 SENSITIVE Sensitive     IMIPENEM <=0.25 SENSITIVE Sensitive     NITROFURANTOIN 32  SENSITIVE Sensitive     TRIMETH/SULFA <=20 SENSITIVE Sensitive     PIP/TAZO 8 SENSITIVE Sensitive     * >=100,000 COLONIES/mL CITROBACTER SPECIES  Culture, blood (Routine X 2) w Reflex to ID Panel     Status: None (Preliminary result)   Collection Time: 09/26/16  2:15 PM  Result Value Ref Range Status   Specimen Description BLOOD RIGHT ANTECUBITAL  Final   Special Requests BOTTLES DRAWN AEROBIC AND ANAEROBIC  UNKNOWN  Final   Culture NO GROWTH 2 DAYS  Final   Report Status PENDING  Incomplete  Culture, blood (Routine X 2) w Reflex to ID Panel     Status: None (Preliminary result)   Collection Time: 09/26/16  4:48 PM  Result Value Ref Range Status   Specimen Description BLOOD LEFT ANTECUBITAL  Final   Special Requests BOTTLES DRAWN AEROBIC ONLY 5CC  Final   Culture NO GROWTH 2 DAYS  Final   Report Status PENDING  Incomplete  MRSA PCR Screening     Status: None   Collection Time: 09/26/16 10:44 PM  Result Value Ref Range Status   MRSA by PCR NEGATIVE NEGATIVE Final    Comment:        The GeneXpert MRSA Assay (FDA approved for NASAL specimens only), is one component of a comprehensive MRSA colonization surveillance program. It is not intended to diagnose MRSA infection nor to guide or monitor treatment for MRSA infections.   Respiratory Panel by PCR     Status: None   Collection Time: 09/28/16  5:25 AM  Result Value Ref Range Status   Adenovirus NOT DETECTED NOT DETECTED Final   Coronavirus 229E NOT DETECTED NOT DETECTED Final   Coronavirus HKU1 NOT DETECTED NOT DETECTED Final   Coronavirus NL63 NOT DETECTED NOT DETECTED Final   Coronavirus OC43 NOT DETECTED NOT DETECTED Final   Metapneumovirus NOT DETECTED NOT DETECTED Final   Rhinovirus / Enterovirus NOT DETECTED NOT DETECTED Final   Influenza A NOT DETECTED NOT DETECTED Final   Influenza B NOT DETECTED NOT DETECTED Final   Parainfluenza Virus 1 NOT DETECTED NOT DETECTED Final   Parainfluenza Virus 2 NOT DETECTED NOT  DETECTED Final   Parainfluenza Virus 3 NOT DETECTED NOT DETECTED Final   Parainfluenza Virus 4 NOT DETECTED NOT DETECTED Final   Respiratory Syncytial Virus NOT DETECTED NOT DETECTED  Final   Bordetella pertussis NOT DETECTED NOT DETECTED Final   Chlamydophila pneumoniae NOT DETECTED NOT DETECTED Final   Mycoplasma pneumoniae NOT DETECTED NOT DETECTED Final  Culture, respiratory (NON-Expectorated)     Status: None (Preliminary result)   Collection Time: 09/28/16  8:00 AM  Result Value Ref Range Status   Specimen Description TRACHEAL ASPIRATE  Final   Special Requests NONE  Final   Gram Stain   Final    FEW WBC PRESENT, PREDOMINANTLY PMN MODERATE SQUAMOUS EPITHELIAL CELLS PRESENT RARE GRAM VARIABLE ROD RARE YEAST RARE GRAM POSITIVE COCCI IN PAIRS    Culture PENDING  Incomplete   Report Status PENDING  Incomplete    Imaging: US Renal  Result Date: 09/28/2016 CLINICAL DATA:  Renal failure EXAM: RENAL / URINARY TRACT ULTRASOUND COMPLETE COMPARISON:  None. FINDINGS: Exam is suboptimal due to patient habitus and non maneuverable. Right Kidney: Not identified Left Kidney: Not identified Bladder: Grossly normal.  Foley catheter in place IMPRESSION: Extremely limited exam with poor visualization the kidneys due to body habitus and inability to maneuver patient on ventilator. Electronically Signed   By: Suzy Bouchard M.D.   On: 09/28/2016 19:58   Dg Chest Port 1 View  Result Date: 09/29/2016 CLINICAL DATA:  Respiratory failure.  Morbid obesity. EXAM: PORTABLE CHEST 1 VIEW COMPARISON:  09/28/2016 FINDINGS: Endotracheal tube is 6.4 cm above the carina. Nasogastric tube is off the film beyond the gastroesophageal junction. Left IJ central line tip overlies the level of the brachiocephalic -SVC confluence. The cardio pericardial silhouette is enlarged and stable in appearance. There are bilateral pleural effusions. Effusion on the right may be loculated. Interstitial changes suggest possible  pulmonary edema. Dense opacities obscure the hemidiaphragms bilaterally and appear stable. IMPRESSION: 1. Stable enlargement of the cardiac silhouette. 2. Stable appearance of bilateral pleural effusions, right greater than left. Suspect right pleural effusion is loculated. 3. Mild pulmonary edema, and bilateral lower lobe infiltrates, stable. Electronically Signed   By: Nolon Nations M.D.   On: 09/29/2016 07:36   Dg Chest Port 1 View  Result Date: 09/28/2016 CLINICAL DATA:  Endotracheal tube EXAM: PORTABLE CHEST 1 VIEW COMPARISON:  09/28/2016 FINDINGS: Endotracheal to 4.3 cm from carina. Stable enlarged cardiac silhouette. There is a moderate RIGHT effusion unchanged from prior. Central venous congestion is unchanged. LEFT central venous line unchanged IMPRESSION: 1. Stable support apparatus.  No significant change. 2. Persistent cardiomegaly and moderate RIGHT effusion. 3. Central venous congestion. Electronically Signed   By: Suzy Bouchard M.D.   On: 09/28/2016 22:16

## 2016-09-29 NOTE — Progress Notes (Signed)
MD, Please call pt's brother Silviano Catania in [place] with update on patients condition before he will give consent for procedure. Brother would like to ask some questions, as well as talk about code status.

## 2016-09-29 NOTE — Progress Notes (Signed)
Notified Dr Corrie Dandy of lactic acid result, no new orders at this time

## 2016-09-29 NOTE — Procedures (Signed)
INTUBATION PROCEDURE NOTE  Indication: ETT leak, tube exchange Consent: Emergent Time Out: yes Medications: Versed, fentanyl, rocuronium Paralytic/RSI: yes Technique: Video Laryngoscopy.  Bougie was inserted through existing ETT then new ETT was exchanged under direct visualization with Glide scope.  Blade: Glide scope Cords Visualized: yes View: Grade 1 # of attempts: 1 Tube confirmation:   Chest rise: yes  Bilateral Breath Sounds: yes  Color change on CO2 detector: N/A  ETCO2: N/A  CXR: Yes Successful placement: yes    Meribeth Mattes, DO., [place]

## 2016-09-29 NOTE — Progress Notes (Signed)
Family Medicine Teaching Service Social Note.  Appreciate excellent care from CCM. Note that patient is still intubated. Will be happy to take over care once he is deemed stable.   Phill Myron, D.O. 09/29/2016, 7:22 AM PGY-2, [place]

## 2016-09-29 NOTE — Progress Notes (Signed)
PULMONARY / CRITICAL CARE MEDICINE     ADMISSION DATE:  09/10/2016 CONSULTATION DATE:  09/28/2016 REFERRING MD:  Dr. McDiarmid, FPTS  CHIEF COMPLAINT:  CODE BLUE  HISTORY OF PRESENT ILLNESS:   61 yo male presented with Rt hip and lower back pain after fall 3 weeks prior to admission.  Also concern for UTI, Rt leg cellulitis, AKI.  PMHx of ETOH cirrhosis, morbid obesity, DM, HTN, A fib, OSA, CKD.  Developed altered mental status, then respiratory arrest 12/31 leading to cardiac arrest with ROSC in 4 minutes.  Developed MODS after this event.  SUBJECTIVE: Remains on full vent support, pressors.  VITAL SIGNS: BP (!) 135/52   Pulse 83   Temp 98.6 F (37 C) (Axillary)   Resp 17   Wt (!) 438 lb 11.5 oz (199 kg)   SpO2 92%   BMI 62.95 kg/m   INTAKE / OUTPUT: I/O last 3 completed shifts: In: 2234.2 [I.V.:1884.2; IV Piggyback:350] Out: 1080 [Urine:1080]  General: Obese Neuro: RASS - 3 Cardiac: regular, no murmur, distant heart sounds Chest: decreased BS, no wheeze Abd: umbilical hernia, massive pannus Ext: dressing over Rt lower leg clean Skin: chronic venous statis changes   CMP Latest Ref Rng & Units 09/29/2016 09/28/2016 09/28/2016  Glucose 65 - 99 mg/dL 149(H) 160(H) 128(H)  BUN 6 - 20 mg/dL 37(H) 35(H) 31(H)  Creatinine 0.61 - 1.24 mg/dL 3.42(H) 3.40(H) 2.94(H)  Sodium 135 - 145 mmol/L 129(L) 129(L) 128(L)  Potassium 3.5 - 5.1 mmol/L 4.7 5.1 4.6  Chloride 101 - 111 mmol/L 99(L) 98(L) 99(L)  CO2 22 - 32 mmol/L 20(L) 17(L) 21(L)  Calcium 8.9 - 10.3 mg/dL 8.2(L) 8.1(L) 8.0(L)  Total Protein 6.5 - 8.1 g/dL - - -  Total Bilirubin 0.3 - 1.2 mg/dL - - -  Alkaline Phos 38 - 126 U/L - - -  AST 15 - 41 U/L - - -  ALT 17 - 63 U/L - - -    CBC Latest Ref Rng & Units 09/28/2016 09/27/2016 09/26/2016  WBC 4.0 - 10.5 K/uL 19.8(H) 10.4 10.2  Hemoglobin 13.0 - 17.0 g/dL 14.2 14.2 15.0  Hematocrit 39.0 - 52.0 % 41.9 40.6 42.2  Platelets 150 - 400 K/uL 355 358 417(H)    ABG     Component Value Date/Time   PHART 7.111 (LL) 09/29/2016 0747   PCO2ART 61.9 (H) 09/29/2016 0747   PO2ART 90.0 09/29/2016 0747   HCO3 19.7 (L) 09/29/2016 0747   TCO2 22 09/29/2016 0747   ACIDBASEDEF 11.0 (H) 09/29/2016 0747   O2SAT 93.0 09/29/2016 0747    CBG (last 3)   Recent Labs  09/28/16 2355 09/29/16 0425 09/29/16 0810  GLUCAP 149* 142* 138*     Imaging US Renal  Result Date: 09/28/2016 CLINICAL DATA:  Renal failure EXAM: RENAL / URINARY TRACT ULTRASOUND COMPLETE COMPARISON:  None. FINDINGS: Exam is suboptimal due to patient habitus and non maneuverable. Right Kidney: Not identified Left Kidney: Not identified Bladder: Grossly normal.  Foley catheter in place IMPRESSION: Extremely limited exam with poor visualization the kidneys due to body habitus and inability to maneuver patient on ventilator. Electronically Signed   By: Suzy Bouchard M.D.   On: 09/28/2016 19:58   Dg Chest Port 1 View  Result Date: 09/29/2016 CLINICAL DATA:  Respiratory failure.  Morbid obesity. EXAM: PORTABLE CHEST 1 VIEW COMPARISON:  09/28/2016 FINDINGS: Endotracheal tube is 6.4 cm above the carina. Nasogastric tube is off the film beyond the gastroesophageal junction. Left IJ central line tip overlies  the level of the brachiocephalic -SVC confluence. The cardio pericardial silhouette is enlarged and stable in appearance. There are bilateral pleural effusions. Effusion on the right may be loculated. Interstitial changes suggest possible pulmonary edema. Dense opacities obscure the hemidiaphragms bilaterally and appear stable. IMPRESSION: 1. Stable enlargement of the cardiac silhouette. 2. Stable appearance of bilateral pleural effusions, right greater than left. Suspect right pleural effusion is loculated. 3. Mild pulmonary edema, and bilateral lower lobe infiltrates, stable. Electronically Signed   By: Nolon Nations M.D.   On: 09/29/2016 07:36   Dg Chest Port 1 View  Result Date: 09/28/2016 CLINICAL  DATA:  Endotracheal tube EXAM: PORTABLE CHEST 1 VIEW COMPARISON:  09/28/2016 FINDINGS: Endotracheal to 4.3 cm from carina. Stable enlarged cardiac silhouette. There is a moderate RIGHT effusion unchanged from prior. Central venous congestion is unchanged. LEFT central venous line unchanged IMPRESSION: 1. Stable support apparatus.  No significant change. 2. Persistent cardiomegaly and moderate RIGHT effusion. 3. Central venous congestion. Electronically Signed   By: Suzy Bouchard M.D.   On: 09/28/2016 22:16   Dg Chest Port 1 View  Result Date: 09/28/2016 CLINICAL DATA:  Encounter for central line placement. EXAM: PORTABLE CHEST 1 VIEW COMPARISON:  09/28/2016 at 3:31 a.m. FINDINGS: New left internal jugular central venous line has its tip projecting at the confluence of the left brachiocephalic vein and superior vena cava. There is no pneumothorax. Endotracheal tube is stable and well positioned. Mild increased opacity noted at the left lung base when compared to the earlier study, likely atelectasis with a small effusion. Right pleural effusion is stable. IMPRESSION: 1. Left internal jugular central venous line tip projects at the confluence of the left brachiocephalic vein and superior vena cava. 2. No pneumothorax. 3. Mild increased opacity at the left lung base from the earlier study likely combination a of small effusion and atelectasis. No other change. Electronically Signed   By: Lajean Manes M.D.   On: 09/28/2016 09:38    CULTURES: UC 112/29: Enetococcus faecalis and Citrobacter   Studies Doppler Rt leg 12/30 >> no DVT, technically limited study Renal US 12/31 >>> unable to do test due to body habitus Echo 1/01 >>>   ANTIBIOTICS: Zosyn 12/29 >>> Vancomycin 12/31 >>>  LINES/TUBES: ETT 12/31>>> Left IJ CVL 12/31>>> Left radial aline 12/31>>>  EVENTS: 12/31 Cardiac arrest 01/01 Change ETT  DISCUSSION: 61 yo male admitted with hip/back pain, UTI, Rt leg cellulitis.  Developed MODS  with septic shock after respiratory leading to cardiac arrest.  ASSESSMENT / PLAN:  Acute on chronic respiratory failure with hypoxia/hypercapnic in setting of PNA, Rt pleural effusion, ARDS with presumed OSA/OHS. - full vent support - f/u ABG with goal pH > 7.2 - adjust PEEP, FiO2 to keep SpO2 88 to 95% - f/u CXR - will ask IR to assess for drainage of Rt pleural effusion  Septic shock from UTI, Rt leg cellulitis, PNA. - pressors to keep MAP > 65 - day 4 of Abx  AKI. Hyponatremia. 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 - foley placed by urology 12/31 - monitor renal fx, urine outpt  Hx of ETOH with cirrhosis. - monitor LFTs - thiamine, folic acid, rifaximin  DM type II. - SSI  Relative adrenal insufficiency >> cortisol 23 from 12/31. - add solu cortef  Acute hepatic encephalopathy. - RASS goal -3 to -4 - continue lactulose  DVT prophylaxis - SQ heparin SUP - protonix Nutrition - tube feeds  Goals of care - Full code.  Pt's son reportedly arrest in [place] 09/27/16.    CC time 3 minutes  Chesley Mires, MD [facility] Pulmonary/Critical Care 09/29/2016, 8:36 AM Pager:  [phone] After 3pm call: [phone]

## 2016-09-29 DEATH — deceased

## 2016-09-30 ENCOUNTER — Inpatient Hospital Stay (HOSPITAL_COMMUNITY): Payer: Medicare Other

## 2016-09-30 DIAGNOSIS — I469 Cardiac arrest, cause unspecified: Secondary | ICD-10-CM

## 2016-09-30 LAB — GLUCOSE, CAPILLARY
GLUCOSE-CAPILLARY: 127 mg/dL — AB (ref 65–99)
GLUCOSE-CAPILLARY: 135 mg/dL — AB (ref 65–99)
GLUCOSE-CAPILLARY: 158 mg/dL — AB (ref 65–99)
Glucose-Capillary: 162 mg/dL — ABNORMAL HIGH (ref 65–99)
Glucose-Capillary: 151 mg/dL — ABNORMAL HIGH (ref 65–99)
Glucose-Capillary: 146 mg/dL — ABNORMAL HIGH (ref 65–99)
Glucose-Capillary: 105 mg/dL — ABNORMAL HIGH (ref 65–99)

## 2016-09-30 LAB — COMPREHENSIVE METABOLIC PANEL
ALBUMIN: 2.1 g/dL — AB (ref 3.5–5.0)
ALT: 34 U/L (ref 17–63)
ANION GAP: 11 (ref 5–15)
AST: 48 U/L — ABNORMAL HIGH (ref 15–41)
Alkaline Phosphatase: 154 U/L — ABNORMAL HIGH (ref 38–126)
BILIRUBIN TOTAL: 1 mg/dL (ref 0.3–1.2)
BUN: 52 mg/dL — ABNORMAL HIGH (ref 6–20)
CO2: 18 mmol/L — ABNORMAL LOW (ref 22–32)
Calcium: 8.1 mg/dL — ABNORMAL LOW (ref 8.9–10.3)
Chloride: 98 mmol/L — ABNORMAL LOW (ref 101–111)
Creatinine, Ser: 3.57 mg/dL — ABNORMAL HIGH (ref 0.61–1.24)
GFR calc Af Amer: 20 mL/min — ABNORMAL LOW (ref >60)
GFR calc non Af Amer: 17 mL/min — ABNORMAL LOW (ref >60)
GLUCOSE: 135 mg/dL — AB (ref 65–99)
POTASSIUM: 4.3 mmol/L (ref 3.5–5.1)
Sodium: 127 mmol/L — ABNORMAL LOW (ref 135–145)
TOTAL PROTEIN: 6.8 g/dL (ref 6.5–8.1)

## 2016-09-30 LAB — CULTURE, RESPIRATORY W GRAM STAIN

## 2016-09-30 LAB — AMMONIA: Ammonia: 133 umol/L — ABNORMAL HIGH (ref 9–35)

## 2016-09-30 LAB — ECHOCARDIOGRAM COMPLETE
Height: 70 in
Weight: 6913.63 oz

## 2016-09-30 LAB — BLOOD GAS, ARTERIAL
ACID-BASE DEFICIT: 6.1 mmol/L — AB (ref 0.0–2.0)
Bicarbonate: 19.4 mmol/L — ABNORMAL LOW (ref 20.0–28.0)
Drawn by: 437071
FIO2: 40
O2 SAT: 92.6 %
PEEP: 5 cmH2O
PH ART: 7.284 — AB (ref 7.350–7.450)
Patient temperature: 98.6
RATE: 34 resp/min
VT: 510 mL
pCO2 arterial: 42.3 mmHg (ref 32.0–48.0)
pO2, Arterial: 78 mmHg — ABNORMAL LOW (ref 83.0–108.0)

## 2016-09-30 LAB — URINE CULTURE

## 2016-09-30 LAB — PHOSPHORUS
Phosphorus: 5.2 mg/dL — ABNORMAL HIGH (ref 2.5–4.6)
Phosphorus: 4.3 mg/dL (ref 2.5–4.6)

## 2016-09-30 LAB — CULTURE, RESPIRATORY

## 2016-09-30 LAB — MAGNESIUM
Magnesium: 2.6 mg/dL — ABNORMAL HIGH (ref 1.7–2.4)
Magnesium: 2.6 mg/dL — ABNORMAL HIGH (ref 1.7–2.4)

## 2016-09-30 LAB — LACTIC ACID, PLASMA: LACTIC ACID, VENOUS: 2.1 mmol/L — AB (ref 0.5–1.9)

## 2016-09-30 LAB — CBC
HCT: 41 % (ref 39.0–52.0)
Hemoglobin: 14.5 g/dL (ref 13.0–17.0)
MCH: 32.7 pg (ref 26.0–34.0)
MCHC: 35.4 g/dL (ref 30.0–36.0)
MCV: 92.3 fL (ref 78.0–100.0)
PLATELETS: 311 10*3/uL (ref 150–400)
RBC: 4.44 MIL/uL (ref 4.22–5.81)
RDW: 18.6 % — ABNORMAL HIGH (ref 11.5–15.5)
WBC: 15.9 10*3/uL — ABNORMAL HIGH (ref 4.0–10.5)

## 2016-09-30 LAB — PROTIME-INR
INR: 1.52
PROTHROMBIN TIME: 18.4 s — AB (ref 11.4–15.2)

## 2016-09-30 LAB — PROCALCITONIN: Procalcitonin: 6.34 ng/mL

## 2016-09-30 MED ORDER — VITAL HIGH PROTEIN PO LIQD
1000.0000 mL | ORAL | Status: DC
  Administered 2016-09-30: 5 mL
  Administered 2016-10-01 – 2016-10-02 (×2): 1000 mL
  Filled 2016-09-30 (×5): qty 1000

## 2016-09-30 MED ORDER — ORAL CARE MOUTH RINSE
15.0000 mL | OROMUCOSAL | Status: DC
  Administered 2016-09-30 – 2016-10-03 (×31): 15 mL via OROMUCOSAL

## 2016-09-30 MED ORDER — PERFLUTREN LIPID MICROSPHERE
INTRAVENOUS | Status: AC
  Administered 2016-09-30: 7 mL via INTRAVENOUS
  Filled 2016-09-30: qty 10

## 2016-09-30 MED ORDER — CHLORHEXIDINE GLUCONATE 0.12% ORAL RINSE (MEDLINE KIT)
15.0000 mL | Freq: Two times a day (BID) | OROMUCOSAL | Status: DC
  Administered 2016-09-30 – 2016-10-03 (×7): 15 mL via OROMUCOSAL

## 2016-09-30 MED ORDER — HYDROCORTISONE NA SUCCINATE PF 100 MG IJ SOLR
50.0000 mg | Freq: Two times a day (BID) | INTRAMUSCULAR | Status: DC
  Administered 2016-09-30 – 2016-10-03 (×6): 50 mg via INTRAVENOUS
  Filled 2016-09-30 (×6): qty 2

## 2016-09-30 MED ORDER — PRO-STAT SUGAR FREE PO LIQD
60.0000 mL | Freq: Every day | ORAL | Status: DC
  Administered 2016-09-30 – 2016-10-03 (×15): 60 mL
  Filled 2016-09-30 (×16): qty 60

## 2016-09-30 MED ORDER — FUROSEMIDE 10 MG/ML IJ SOLN
40.0000 mg | Freq: Once | INTRAMUSCULAR | Status: AC
  Administered 2016-09-30: 40 mg via INTRAVENOUS
  Filled 2016-09-30: qty 4

## 2016-09-30 MED ORDER — PERFLUTREN LIPID MICROSPHERE
1.0000 mL | INTRAVENOUS | Status: AC | PRN
  Administered 2016-09-30: 7 mL via INTRAVENOUS
  Filled 2016-09-30: qty 10

## 2016-09-30 NOTE — Progress Notes (Signed)
  Echocardiogram 2D Echocardiogram with definity has been performed.  Darlina Sicilian M 09/30/2016, 1:30 PM

## 2016-09-30 NOTE — Progress Notes (Signed)
[name] Progress Note Patient Name: Matthew [name] DOB: 1956/08/15 MRN: [id number]   Date of Service  09/30/2016  HPI/Events of Note  Increased O2 requirement s/p bath associated with increased Ppeak.   eICU Interventions  Will order: 1. Please do already ordered AM portable CXR now.      Intervention Category Intermediate Interventions: Respiratory distress - evaluation and management  Mohmed Farver Eugene 09/30/2016, 3:31 AM

## 2016-09-30 NOTE — Progress Notes (Signed)
eLink Physician-Brief Progress Note Patient Name: Matthew [name] DOB: 04/26/1956 MRN: [id number]   Date of Service  09/30/2016  HPI/Events of Note  Patient has improved with increased sedation. Now back on 40% FiO2. CXR reveals Support equipment appears satisfactorily positioned. No significant interval change in the bilateral airspace opacities. Right pleural effusion, unchanged.  eICU Interventions  Will order: 1. Increase PEEP to 8.     Intervention Category Major Interventions: Respiratory failure - evaluation and management Intermediate Interventions: Respiratory distress - evaluation and management  Daizy Outen Eugene 09/30/2016, 4:11 AM

## 2016-09-30 NOTE — Progress Notes (Signed)
Pharmacist Heart Failure Core Measure Documentation  Assessment: PATRICH CLOVER has an EF documented as 65-70% on echo 05/18/16.  Rationale: Heart failure patients with left ventricular systolic dysfunction (LVSD) and an EF < 40% should be prescribed an angiotensin converting enzyme inhibitor (ACEI) or angiotensin receptor blocker (ARB) at discharge unless a contraindication is documented in the medical record.  This patient is not currently on an ACEI or ARB for HF.  This note is being placed in the record in order to provide documentation that a contraindication to the use of these agents is present for this encounter.  ACE Inhibitor or Angiotensin Receptor Blocker is contraindicated (specify all that apply)  []   ACEI allergy AND ARB allergy []   Angioedema []   Moderate or severe aortic stenosis []   Hyperkalemia []   Hypotension []   Renal artery stenosis [x]   Worsening renal function, preexisting renal disease or dysfunction   Georgina Peer 09/30/2016 3:37 PM

## 2016-09-30 NOTE — Progress Notes (Signed)
PULMONARY / CRITICAL CARE MEDICINE     ADMISSION DATE:  09/24/2016 CONSULTATION DATE:  09/28/2016 REFERRING MD:  Dr. McDiarmid, FPTS  CHIEF COMPLAINT:  CODE BLUE  HISTORY OF PRESENT ILLNESS:   61 yo male presented with Rt hip and lower back pain after fall 3 weeks prior to admission.  Also concern for UTI, Rt leg cellulitis, AKI.  PMHx of ETOH cirrhosis, morbid obesity, DM, HTN, A fib, OSA, CKD.  Developed altered mental status, then respiratory arrest 12/31 leading to cardiac arrest with ROSC in 4 minutes.  Developed MODS after this event.  SUBJECTIVE: Remains on full vent support, pressors.  VITAL SIGNS: BP 116/71 (BP Location: Left Leg)   Pulse 72   Temp 97.6 F (36.4 C) (Oral)   Resp (!) 34   Ht 5\' 10"  (1.778 m)   Wt (!) 432 lb 1.6 oz (196 kg)   SpO2 95%   BMI 62.00 kg/m   INTAKE / OUTPUT: I/O last 3 completed shifts: In: 3708.4 [I.V.:2231.1; NG/GT:1277.3; IV Piggyback:200] Out: 1100 [Urine:1100]  General: Obese Neuro: RASS - 3 Cardiac: regular, no murmur, distant heart sounds Chest: decreased BS, no wheeze Abd: umbilical hernia, massive pannus GU: massive scrotal edema Ext: dressing over Rt lower leg clean Skin: chronic venous statis changes   CMP Latest Ref Rng & Units 09/30/2016 09/29/2016 09/28/2016  Glucose 65 - 99 mg/dL 135(H) 149(H) 160(H)  BUN 6 - 20 mg/dL 52(H) 37(H) 35(H)  Creatinine 0.61 - 1.24 mg/dL 3.57(H) 3.42(H) 3.40(H)  Sodium 135 - 145 mmol/L 127(L) 129(L) 129(L)  Potassium 3.5 - 5.1 mmol/L 4.3 4.7 5.1  Chloride 101 - 111 mmol/L 98(L) 99(L) 98(L)  CO2 22 - 32 mmol/L 18(L) 20(L) 17(L)  Calcium 8.9 - 10.3 mg/dL 8.1(L) 8.2(L) 8.1(L)  Total Protein 6.5 - 8.1 g/dL 6.8 - -  Total Bilirubin 0.3 - 1.2 mg/dL 1.0 - -  Alkaline Phos 38 - 126 U/L 154(H) - -  AST 15 - 41 U/L 48(H) - -  ALT 17 - 63 U/L 34 - -    CBC Latest Ref Rng & Units 09/30/2016 09/28/2016 09/27/2016  WBC 4.0 - 10.5 K/uL 15.9(H) 19.8(H) 10.4  Hemoglobin 13.0 - 17.0 g/dL 14.5 14.2 14.2   Hematocrit 39.0 - 52.0 % 41.0 41.9 40.6  Platelets 150 - 400 K/uL 311 355 358    ABG    Component Value Date/Time   PHART 7.284 (L) 09/30/2016 0400   PCO2ART 42.3 09/30/2016 0400   PO2ART 78.0 (L) 09/30/2016 0400   HCO3 19.4 (L) 09/30/2016 0400   TCO2 22 09/29/2016 0747   ACIDBASEDEF 6.1 (H) 09/30/2016 0400   O2SAT 92.6 09/30/2016 0400    CBG (last 3)   Recent Labs  09/30/16 0049 09/30/16 0409 09/30/16 0820  GLUCAP 158* 135* 162*     Imaging Dg Chest Port 1 View  Result Date: 09/30/2016 CLINICAL DATA:  Respiratory failure. EXAM: PORTABLE CHEST 1 VIEW COMPARISON:  09/29/2016 FINDINGS: Endotracheal tube is 5.8 cm above the carina. Nasogastric tube extends below the diaphragm and beyond the inferior edge of the image. Left jugular central line extends into the SVC. Moderately large right pleural effusion, probably unchanged. Central and basilar airspace opacities, unchanged. No pneumothorax. IMPRESSION: Support equipment appears satisfactorily positioned. No significant interval change in the bilateral airspace opacities. Right pleural effusion, unchanged. Electronically Signed   By: Andreas Newport M.D.   On: 09/30/2016 03:45    CULTURES: Urine 12/29 >>  Enetococcus faecalis and Citrobacter   Studies Doppler Rt  leg 12/30 >> no DVT, technically limited study Renal US 12/31 >>> unable to do test due to body habitus Echo 1/02 >>>   ANTIBIOTICS: Zosyn 12/29 >>> Vancomycin 12/31 >>>  LINES/TUBES: ETT 12/31>>> Left IJ CVL 12/31>>> Left radial aline 12/31>>>  EVENTS: 12/31 Cardiac arrest 01/01 Changed ETT  DISCUSSION: 61 yo male admitted with hip/back pain, UTI, Rt leg cellulitis.  Developed MODS with septic shock after respiratory leading to cardiac arrest.  ASSESSMENT / PLAN:  Acute on chronic respiratory failure with hypoxia/hypercapnic in setting of PNA, Rt pleural effusion, ARDS with presumed OSA/OHS. - full vent support - f/u ABG with goal pH > 7.2 -  adjust PEEP, FiO2 to keep SpO2 88 to 95% - f/u CXR  Septic shock from UTI, Rt leg cellulitis, PNA. - pressors to keep MAP > 65 - day 5 of Abx  AKI. Hyponatremia. 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 - foley placed by urology 12/31 - monitor renal fx, urine outpt - lasix 40 mg IV x one 1/02  Hx of ETOH with cirrhosis. - monitor LFTs - thiamine, folic acid, rifaximin  DM type II. - SSI  Relative adrenal insufficiency >> cortisol 23 from 12/31. - added solu cortef  Acute hepatic encephalopathy. - RASS goal -3 to -4 - continue lactulose  DVT prophylaxis - SQ heparin SUP - protonix Nutrition - tube feeds  Goals of care - Spoke with pt's brother, [name].  Aydon told him before this hospitalization that he would not want to be on long term life support and end up in a facility.  Will place order for DNR status, and family would not want him to be put on dialysis either.  Will continue current therapies, but no escalation of care.  [name] will update his sister, and Jeffery's son (who is incarcerated) if he is able to be contacted.  CC time 34 minutes  Chesley Mires, MD [facility] Pulmonary/Critical Care 09/30/2016, 8:51 AM Pager:  [phone] After 3pm call: [phone]

## 2016-09-30 NOTE — Progress Notes (Signed)
Initial Nutrition Assessment  DOCUMENTATION CODES:   Morbid obesity  INTERVENTION:   Vital High Protein @ 5 ml/hr (120 ml/day) 60 ml Prostat six times per day Provides: 1320 kcal, 190 grams protein, and 100 ml H2O.  TF regimen and propofol at current rate providing 1950 total kcal/day   NUTRITION DIAGNOSIS:   Inadequate oral intake related to inability to eat as evidenced by NPO status.  GOAL:   Provide needs based on ASPEN/SCCM guidelines  MONITOR:   TF tolerance, I & O's, Weight trends  REASON FOR ASSESSMENT:   Consult Enteral/tube feeding initiation and management  ASSESSMENT:   Pt with hx of ETOH cirrhosis, morbid obesity, DM, HTN, CKD, OSA admitted with hip/back pain, UTI, Rt leg cellulitis.  Developed MODS with septic shock after respiratory leading to cardiac arrest.   Patient is currently intubated on ventilator support Pt now DNR.  Spoke with RN at bedside.   Propofol: 23.9 ml/hr provides: 630 kcal per day  Medications reviewed and include: solucortef, lactulose Labs reviewed: NH4: 133, Na 127, PO4 5.2, magnesium 2.6 Nutrition-Focused physical exam completed. Findings are no fat depletion, no muscle depletion, and severe edema.   Diet Order:  Diet NPO time specified  Skin:  Reviewed, no issues  Last BM:  unknown  Height:   Ht Readings from Last 1 Encounters:  09/30/16 5\' 10"  (1.778 m)    Weight:   Wt Readings from Last 1 Encounters:  09/30/16 (!) 432 lb 1.6 oz (196 kg)    Ideal Body Weight:  75.4 kg  BMI:  Body mass index is 62 kg/m.  Estimated Nutritional Needs:   Kcal:  [id number]  Protein:  >/= 188 grams  Fluid:  >2 L/day  EDUCATION NEEDS:   No education needs identified at this time  [place], [place], [place] Pager [phone] After Hours Pager

## 2016-09-30 NOTE — Progress Notes (Signed)
eLink Physician-Brief Progress Note Patient Name: LANZ MOR DOB: 24-Aug-1956 MRN: [id number]   Date of Service  09/30/2016  HPI/Events of Note  Mild bleeding with mouth care inr last and plat acceptable  eICU Interventions  Dc sub q hep Use scd     Intervention Category Intermediate Interventions: Bleeding - evaluation and treatment with blood products  Raylene Miyamoto. 09/30/2016, 3:55 PM

## 2016-10-01 ENCOUNTER — Inpatient Hospital Stay (HOSPITAL_COMMUNITY): Payer: Medicare Other

## 2016-10-01 LAB — CULTURE, BLOOD (ROUTINE X 2)
CULTURE: NO GROWTH
CULTURE: NO GROWTH

## 2016-10-01 LAB — COMPREHENSIVE METABOLIC PANEL
ALT: 35 U/L (ref 17–63)
ANION GAP: 12 (ref 5–15)
AST: 68 U/L — AB (ref 15–41)
Albumin: 2 g/dL — ABNORMAL LOW (ref 3.5–5.0)
Alkaline Phosphatase: 129 U/L — ABNORMAL HIGH (ref 38–126)
BUN: 62 mg/dL — AB (ref 6–20)
CO2: 19 mmol/L — ABNORMAL LOW (ref 22–32)
Calcium: 8.4 mg/dL — ABNORMAL LOW (ref 8.9–10.3)
Chloride: 101 mmol/L (ref 101–111)
Creatinine, Ser: 3.38 mg/dL — ABNORMAL HIGH (ref 0.61–1.24)
GFR calc Af Amer: 21 mL/min — ABNORMAL LOW (ref >60)
GFR, EST NON AFRICAN AMERICAN: 18 mL/min — AB (ref >60)
Glucose, Bld: 140 mg/dL — ABNORMAL HIGH (ref 65–99)
POTASSIUM: 4.1 mmol/L (ref 3.5–5.1)
Sodium: 132 mmol/L — ABNORMAL LOW (ref 135–145)
TOTAL PROTEIN: 6.3 g/dL — AB (ref 6.5–8.1)
Total Bilirubin: 1.1 mg/dL (ref 0.3–1.2)

## 2016-10-01 LAB — CBC
HEMATOCRIT: 39.7 % (ref 39.0–52.0)
Hemoglobin: 14.3 g/dL (ref 13.0–17.0)
MCH: 32.6 pg (ref 26.0–34.0)
MCHC: 36 g/dL (ref 30.0–36.0)
MCV: 90.6 fL (ref 78.0–100.0)
Platelets: 310 10*3/uL (ref 150–400)
RBC: 4.38 MIL/uL (ref 4.22–5.81)
RDW: 18.6 % — AB (ref 11.5–15.5)
WBC: 17.3 10*3/uL — AB (ref 4.0–10.5)

## 2016-10-01 LAB — GLUCOSE, CAPILLARY
GLUCOSE-CAPILLARY: 135 mg/dL — AB (ref 65–99)
Glucose-Capillary: 129 mg/dL — ABNORMAL HIGH (ref 65–99)
Glucose-Capillary: 114 mg/dL — ABNORMAL HIGH (ref 65–99)
Glucose-Capillary: 150 mg/dL — ABNORMAL HIGH (ref 65–99)
Glucose-Capillary: 143 mg/dL — ABNORMAL HIGH (ref 65–99)

## 2016-10-01 NOTE — Progress Notes (Signed)
PULMONARY / CRITICAL CARE MEDICINE     ADMISSION DATE:  08/29/2016 CONSULTATION DATE:  09/28/2016 REFERRING MD:  Dr. McDiarmid, FPTS  CHIEF COMPLAINT:  CODE BLUE  HISTORY OF PRESENT ILLNESS:   61 yo male presented with Rt hip and lower back pain after fall 3 weeks prior to admission.  Also concern for UTI, Rt leg cellulitis, AKI.  PMHx of ETOH cirrhosis, morbid obesity, DM, HTN, A fib, OSA, CKD.  Developed altered mental status, then respiratory arrest 12/31 leading to cardiac arrest with ROSC in 4 minutes.  Developed MODS after this event.  SUBJECTIVE: Remains on full vent support, pressors.  VITAL SIGNS: BP (!) 136/59   Pulse 68   Temp 98 F (36.7 C) (Oral)   Resp (!) 0   Ht 5\' 10"  (1.778 m)   Wt (!) 440 lb 14.7 oz (200 kg)   SpO2 95%   BMI 63.27 kg/m   INTAKE / OUTPUT: I/O last 3 completed shifts: In: 3536.4 [I.V.:1525.3; NG/GT:1311.1; IV Piggyback:700] Out: 1430 [Urine:1305; Emesis/NG output:125]  General: Obese Neuro: RASS - 3 Cardiac: regular, no murmur, distant heart sounds Chest: decreased BS on Rt > Lt Abd: umbilical hernia, massive pannus GU: massive scrotal edema Ext: dressing over Rt lower leg clean Skin: chronic venous statis changes   CMP Latest Ref Rng & Units 10/01/2016 09/30/2016 09/29/2016  Glucose 65 - 99 mg/dL 140(H) 135(H) 149(H)  BUN 6 - 20 mg/dL 62(H) 52(H) 37(H)  Creatinine 0.61 - 1.24 mg/dL 3.38(H) 3.57(H) 3.42(H)  Sodium 135 - 145 mmol/L 132(L) 127(L) 129(L)  Potassium 3.5 - 5.1 mmol/L 4.1 4.3 4.7  Chloride 101 - 111 mmol/L 101 98(L) 99(L)  CO2 22 - 32 mmol/L 19(L) 18(L) 20(L)  Calcium 8.9 - 10.3 mg/dL 8.4(L) 8.1(L) 8.2(L)  Total Protein 6.5 - 8.1 g/dL 6.3(L) 6.8 -  Total Bilirubin 0.3 - 1.2 mg/dL 1.1 1.0 -  Alkaline Phos 38 - 126 U/L 129(H) 154(H) -  AST 15 - 41 U/L 68(H) 48(H) -  ALT 17 - 63 U/L 35 34 -    CBC Latest Ref Rng & Units 10/01/2016 09/30/2016 09/28/2016  WBC 4.0 - 10.5 K/uL 17.3(H) 15.9(H) 19.8(H)  Hemoglobin 13.0 - 17.0 g/dL  14.3 14.5 14.2  Hematocrit 39.0 - 52.0 % 39.7 41.0 41.9  Platelets 150 - 400 K/uL 310 311 355    ABG    Component Value Date/Time   PHART 7.284 (L) 09/30/2016 0400   PCO2ART 42.3 09/30/2016 0400   PO2ART 78.0 (L) 09/30/2016 0400   HCO3 19.4 (L) 09/30/2016 0400   TCO2 22 09/29/2016 0747   ACIDBASEDEF 6.1 (H) 09/30/2016 0400   O2SAT 92.6 09/30/2016 0400    CBG (last 3)   Recent Labs  09/30/16 2317 10/01/16 0343 10/01/16 0821  GLUCAP 105* 129* 114*     Imaging Dg Chest Port 1 View  Result Date: 10/01/2016 CLINICAL DATA:  Respiratory failure. EXAM: PORTABLE CHEST 1 VIEW COMPARISON:  09/30/2016. FINDINGS: Endotracheal tube, left IJ line, NG tube in stable position. Atelectasis and consolidation of the entire right lung noted on today's exam. Diffuse left lung pulmonary infiltrate. Small left pleural effusion cannot be excluded. No pneumothorax. IMPRESSION: 1. Lines and tubes in stable position. 2. Complete atelectasis and consolidation of the entire right lung noted on today's exam. Diffuse left lung pulmonary infiltrate also noted on today's exam. Small left pleural effusion. Electronically Signed   By: Marcello Moores  Register   On: 10/01/2016 07:10    CULTURES: Urine 12/29 >>  Enetococcus  faecalis and Citrobacter   Studies Doppler Rt leg 12/30 >> no DVT, technically limited study Renal US 12/31 >>> unable to do test due to body habitus Echo 1/02 >>> EF 55%  ANTIBIOTICS: Zosyn 12/29 >>> Vancomycin 12/31 >>>  LINES/TUBES: ETT 12/31>>> Left IJ CVL 12/31>>> Left radial aline 12/31>>>  EVENTS: 12/31 Cardiac arrest 01/01 Changed ETT 01/03 Family discussion >> no escalation of care with plan for comfort care once family arrives  DISCUSSION: 61 yo male admitted with hip/back pain, UTI, Rt leg cellulitis.  Developed MODS with septic shock after respiratory leading to cardiac arrest.  ASSESSMENT / PLAN:  Acute on chronic respiratory failure with hypoxia/hypercapnic in setting  of PNA, Rt pleural effusion, ARDS with presumed OSA/OHS. - continue full vent support until family arrives  Septic shock from UTI, Rt leg cellulitis, PNA. - pressors to keep MAP > 65 - day 6/7 of Abx  AKI. Hyponatremia. 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 - foley placed by urology 12/31 - defer additional lab testing  Hx of ETOH with cirrhosis. - thiamine, folic acid, rifaximin  DM type II. - SSI  Relative adrenal insufficiency >> cortisol 23 from 12/31. - added solu cortef  Acute hepatic encephalopathy. - RASS goal -3 to -4 - continue lactulose  DVT prophylaxis - SQ heparin SUP - protonix Nutrition - tube feeds  Goals of care - DNR.  No escalation of care.  Plan to transition to comfort care when family arrives.  CC time 32 minutes  Chesley Mires, MD [facility] Pulmonary/Critical Care 10/01/2016, 9:24 AM Pager:  [phone] After 3pm call: [phone]

## 2016-10-01 NOTE — Progress Notes (Signed)
I have been Matthew [name]'s PCP for the past year.  It is sad but not surprising to see him in this condition.  He had been tenuously stable over the past year.  As often happens with patients with such a heavy burden of chronic disease, he has fallen hard off the cliff.    I will provide some psychosocial background.  Matthew [name] has grown weary of this life, which for the past year has been a constant struggle both physically and emotionally.  He very clearly expressed that he would not want prolonged vent and/or dialysis support.  Even when his depression was controlled, he often wondered if the struggle was worth it and talked openly of stopping all his medications.  Matthew [name] had a strong disdain for being dependant.  He felt both guilt and shame for becoming so helpless.  Despite these dark feelings, I am [name] of Matthew [name] for staying sober.    His main reason for living has been his son, who had been living with Matthew [name].  His son has his own troubles - Matthew [name] admits that he has "a wild side."  Matthew [name]'s main reason for living has been to help his son find a better path.  I know the son has been on probation and now learn through the notes that he is incarcerated with a DUI.  Stating the obvious, a DUI while on probation could result in significant jail time.  I am sure this weighed heavily on Matthew [name]'s already troubled mind.    I fully support the No Code Blue order and the plan for a terminal wean once the family arrives.  Knowing Matthew [name], I am certain it is what he would want.  If Matthew [name] could speak for himself, he would admit that depression influenced his wishes.  He would add that this is not some acute depression.  Rather it is a deep and persistent sadness with what his life has become.  The abrupt and unexpected decline does not change the fact that Matthew [name] is dying.  I firmly believe that he will welcome the peace.

## 2016-10-02 DIAGNOSIS — G9340 Encephalopathy, unspecified: Secondary | ICD-10-CM

## 2016-10-02 DIAGNOSIS — J9600 Acute respiratory failure, unspecified whether with hypoxia or hypercapnia: Secondary | ICD-10-CM

## 2016-10-02 LAB — GLUCOSE, CAPILLARY
GLUCOSE-CAPILLARY: 128 mg/dL — AB (ref 65–99)
GLUCOSE-CAPILLARY: 131 mg/dL — AB (ref 65–99)
GLUCOSE-CAPILLARY: 142 mg/dL — AB (ref 65–99)
Glucose-Capillary: 140 mg/dL — ABNORMAL HIGH (ref 65–99)
Glucose-Capillary: 112 mg/dL — ABNORMAL HIGH (ref 65–99)
Glucose-Capillary: 129 mg/dL — ABNORMAL HIGH (ref 65–99)

## 2016-10-02 LAB — TRIGLYCERIDES: Triglycerides: 98 mg/dL (ref <150)

## 2016-10-02 NOTE — Progress Notes (Signed)
PULMONARY / CRITICAL CARE MEDICINE     ADMISSION DATE:  09/06/2016 CONSULTATION DATE:  09/28/2016 REFERRING MD:  Dr. McDiarmid, FPTS  CHIEF COMPLAINT:  CODE BLUE  HISTORY OF PRESENT ILLNESS:   61 yo male presented with Rt hip and lower back pain after fall 3 weeks prior to admission.  Also concern for UTI, Rt leg cellulitis, AKI.  PMHx of ETOH cirrhosis, morbid obesity, DM, HTN, A fib, OSA, CKD.  Developed altered mental status, then respiratory arrest 12/31 leading to cardiac arrest with ROSC in 4 minutes.  Developed MODS after this event.  SUBJECTIVE: Remains on full vent support. Does not tolerate weaning trials  VITAL SIGNS: BP (!) 102/55   Pulse 76   Temp 97.6 F (36.4 C) (Oral)   Resp (!) 0   Ht 5\' 10"  (1.778 m)   Wt (!) 443 lb 9.1 oz (201.2 kg)   SpO2 96%   BMI 63.65 kg/m   INTAKE / OUTPUT: I/O last 3 completed shifts: In: 2617.4 [I.V.:1532.4; NG/GT:335; IV Piggyback:750] Out: 2135 [Urine:1735; Stool:400]  General: Obese Neuro: Sedated Cardiac: regular, no murmur, distant heart sounds Chest: decreased BS on Rt > Lt Abd: umbilical hernia, massive pannus Ext: dressing over Rt lower leg clean Skin: chronic venous statis changes   CMP Latest Ref Rng & Units 10/01/2016 09/30/2016 09/29/2016  Glucose 65 - 99 mg/dL 140(H) 135(H) 149(H)  BUN 6 - 20 mg/dL 62(H) 52(H) 37(H)  Creatinine 0.61 - 1.24 mg/dL 3.38(H) 3.57(H) 3.42(H)  Sodium 135 - 145 mmol/L 132(L) 127(L) 129(L)  Potassium 3.5 - 5.1 mmol/L 4.1 4.3 4.7  Chloride 101 - 111 mmol/L 101 98(L) 99(L)  CO2 22 - 32 mmol/L 19(L) 18(L) 20(L)  Calcium 8.9 - 10.3 mg/dL 8.4(L) 8.1(L) 8.2(L)  Total Protein 6.5 - 8.1 g/dL 6.3(L) 6.8 -  Total Bilirubin 0.3 - 1.2 mg/dL 1.1 1.0 -  Alkaline Phos 38 - 126 U/L 129(H) 154(H) -  AST 15 - 41 U/L 68(H) 48(H) -  ALT 17 - 63 U/L 35 34 -    CBC Latest Ref Rng & Units 10/01/2016 09/30/2016 09/28/2016  WBC 4.0 - 10.5 K/uL 17.3(H) 15.9(H) 19.8(H)  Hemoglobin 13.0 - 17.0 g/dL 14.3 14.5 14.2   Hematocrit 39.0 - 52.0 % 39.7 41.0 41.9  Platelets 150 - 400 K/uL 310 311 355    ABG    Component Value Date/Time   PHART 7.284 (L) 09/30/2016 0400   PCO2ART 42.3 09/30/2016 0400   PO2ART 78.0 (L) 09/30/2016 0400   HCO3 19.4 (L) 09/30/2016 0400   TCO2 22 09/29/2016 0747   ACIDBASEDEF 6.1 (H) 09/30/2016 0400   O2SAT 92.6 09/30/2016 0400    CBG (last 3)   Recent Labs  10/01/16 2358 10/02/16 0502 10/02/16 0834  GLUCAP 140* 112* 128*     Imaging No results found.  CULTURES: Urine 12/29 >>  Enetococcus faecalis and Citrobacter   Studies Doppler Rt leg 12/30 >> no DVT, technically limited study Renal US 12/31 >>> unable to do test due to body habitus Echo 1/02 >>> EF 55%  ANTIBIOTICS: Zosyn 12/29 >>> Vancomycin 12/31 >>>  LINES/TUBES: ETT 12/31>>> Left IJ CVL 12/31>>> Left radial aline 12/31>>>  EVENTS: 12/31 Cardiac arrest 01/01 Changed ETT 01/03 Family discussion >> no escalation of care with plan for comfort care once family arrives  DISCUSSION: 61 yo male admitted with hip/back pain, UTI, Rt leg cellulitis.  Developed MODS with septic shock after respiratory leading to cardiac arrest.  ASSESSMENT / PLAN:  Acute on chronic  respiratory failure with hypoxia/hypercapnic in setting of PNA, Rt pleural effusion, ARDS with presumed OSA/OHS. - continue full vent support until family arrives  Septic shock from UTI, Rt leg cellulitis, PNA. - pressors to keep MAP > 65 - day 7/7 of Abx  AKI. Hyponatremia. 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 - foley placed by urology 12/31 - defer additional lab testing  Hx of ETOH with cirrhosis. - thiamine, folic acid, rifaximin  DM type II. - SSI  Relative adrenal insufficiency >> cortisol 23 from 12/31. - added solu cortef  Acute hepatic encephalopathy. - RASS goal -3 to -4 - lactulose titrated for 2-3 BM  DVT prophylaxis - SQ heparin SUP - protonix Nutrition - tube feeds  Goals of care - DNR.   No escalation of care.  Plan to transition to comfort care when family arrives.  Jacques Earthly, MD  Internal Medicine PGY-3 10/02/2016, 10:28 AM Pager:  [phone] After 3pm call: [phone]

## 2016-10-02 NOTE — Progress Notes (Signed)
RN doing wake-up assessment, attempted SBT. Pt w/ increased WOB, increased RR, decreased sat despite increasing PS.  Increased fio2 to 50%, pt placed back on full vent support.  RN aware.

## 2016-10-03 ENCOUNTER — Other Ambulatory Visit: Payer: Self-pay

## 2016-10-03 LAB — GLUCOSE, CAPILLARY
GLUCOSE-CAPILLARY: 152 mg/dL — AB (ref 65–99)
Glucose-Capillary: 136 mg/dL — ABNORMAL HIGH (ref 65–99)
Glucose-Capillary: 146 mg/dL — ABNORMAL HIGH (ref 65–99)
Glucose-Capillary: 150 mg/dL — ABNORMAL HIGH (ref 65–99)

## 2016-10-03 MED ORDER — FENTANYL BOLUS VIA INFUSION
50.0000 ug | INTRAVENOUS | Status: DC | PRN
  Filled 2016-10-03: qty 200

## 2016-10-03 MED ORDER — MIDAZOLAM HCL 5 MG/ML IJ SOLN
1.0000 mg/h | INTRAMUSCULAR | Status: DC
  Filled 2016-10-03: qty 10

## 2016-10-03 MED ORDER — MIDAZOLAM BOLUS VIA INFUSION (WITHDRAWAL LIFE SUSTAINING TX)
5.0000 mg | INTRAVENOUS | Status: DC | PRN
  Filled 2016-10-03: qty 20

## 2016-10-03 MED ORDER — FENTANYL 2500MCG IN NS 250ML (10MCG/ML) PREMIX INFUSION: 100.0000 ug/h | INTRAVENOUS | Status: DC

## 2016-10-06 ENCOUNTER — Telehealth: Payer: Self-pay

## 2016-10-06 NOTE — Telephone Encounter (Signed)
On 10/06/2016 I received a death certificate from [facility] (original). The death certificate is for cremation. The patient is a patient of Doctor Ramaswamy. The death certificate will be taken to [facility] this pm for signature.  On October 15, 2016 I received the death certificate back from Doctor Ramaswamy. I got the death certificate ready and called the funeral home to let them know the death certificate is ready for pickup. I also faxed a copy to the funeral homer per the funeral home request.

## 2016-10-08 ENCOUNTER — Ambulatory Visit: Payer: Self-pay | Admitting: Family Medicine

## 2016-10-10 ENCOUNTER — Ambulatory Visit: Payer: Self-pay | Admitting: Pharmacist

## 2016-10-14 ENCOUNTER — Other Ambulatory Visit: Payer: Self-pay | Admitting: Pharmacist

## 2016-10-14 NOTE — Patient Outreach (Signed)
[facility]) Care Management  10/14/2016  COSMOS RAUSCHENBACH 1955/10/16 [id number]  [facility] pharmacy will close case as patient unfortunately is now deceased.    Bennye Alm, PharmD, [place] PGY2 Pharmacy Resident [phone]

## 2016-10-30 NOTE — Patient Outreach (Signed)
[name] Care Management  10/20/16  Matthew [name] 09-01-1956 [id number]   Home visit scheduled today, however, patient is in ICU at [facility]: Attempt to re-engage patient once he is discharged from the hospital

## 2016-10-30 NOTE — Progress Notes (Signed)
RT terminally extubated patient to room air per MD order. No complications. Vital signs stable at this time. RN and family at bedside. RT will continue to monitor.

## 2016-10-30 NOTE — Progress Notes (Signed)
Pt expired at 42, 2 RN's listened at bedside for heart and lung sounds for one minute.  Berniece Salines, RN was the second nurse.  Pt's family at the bedside.  Support provided to family.    Darrel Reach, RN

## 2016-10-30 NOTE — Progress Notes (Signed)
Responded to consult for end of life. Several family members were in room: father, son, brother, sister, and some of their spouses. Provided emotional/spiritual support and prayer for comfort that family appreciated and in which nurse joined. Chaplain available for f/u.   2016/10/23 1400  Clinical Encounter Type  Visited With Patient and family together;Health care provider  Visit Type Initial;Psychological support;Spiritual support;Social support;Pre-op;Critical Care;Other (Comment) (end of life)  Referral From Nurse  Spiritual Encounters  Spiritual Needs Prayer;Emotional;Grief support  Stress Factors  Patient Stress Factors Health changes;Other (Comment) (end of life)  Family Stress Factors Family relationships;Loss   Gerrit Heck, Chaplain

## 2016-10-30 NOTE — Progress Notes (Signed)
PULMONARY / CRITICAL CARE MEDICINE     ADMISSION DATE:  09/21/2016 CONSULTATION DATE:  09/28/2016 REFERRING MD:  Dr. McDiarmid, FPTS  CHIEF COMPLAINT:  CODE BLUE  HISTORY OF PRESENT ILLNESS:   61 yo male presented with Rt hip and lower back pain after fall 3 weeks prior to admission.  Also concern for UTI, Rt leg cellulitis, AKI.  PMHx of ETOH cirrhosis, morbid obesity, DM, HTN, A fib, OSA, CKD.  Developed altered mental status, then respiratory arrest 12/31 leading to cardiac arrest with ROSC in 4 minutes.  Developed MODS after this event.  SUBJECTIVE: Unchanged. Remains on full vent support.  VITAL SIGNS: BP (!) 93/50   Pulse 74   Temp 97.5 F (36.4 C) (Axillary)   Resp (!) 34   Ht 5\' 10"  (1.778 m)   Wt (!) 427 lb 11.1 oz (194 kg)   SpO2 96%   BMI 61.37 kg/m   INTAKE / OUTPUT: I/O last 3 completed shifts: In: 1482.8 [I.V.:1122.8; Other:75; NG/GT:185; IV Piggyback:100] Out: 1860 [Urine:1460; Stool:400]  General: Obese Neuro: Sedated Cardiac: regular, no murmur, distant heart sounds Chest: decreased BS on Rt > Lt Abd: umbilical hernia, massive pannus Ext: dressing over Rt lower leg clean Skin: chronic venous statis changes   CMP Latest Ref Rng & Units 10/01/2016 09/30/2016 09/29/2016  Glucose 65 - 99 mg/dL 140(H) 135(H) 149(H)  BUN 6 - 20 mg/dL 62(H) 52(H) 37(H)  Creatinine 0.61 - 1.24 mg/dL 3.38(H) 3.57(H) 3.42(H)  Sodium 135 - 145 mmol/L 132(L) 127(L) 129(L)  Potassium 3.5 - 5.1 mmol/L 4.1 4.3 4.7  Chloride 101 - 111 mmol/L 101 98(L) 99(L)  CO2 22 - 32 mmol/L 19(L) 18(L) 20(L)  Calcium 8.9 - 10.3 mg/dL 8.4(L) 8.1(L) 8.2(L)  Total Protein 6.5 - 8.1 g/dL 6.3(L) 6.8 -  Total Bilirubin 0.3 - 1.2 mg/dL 1.1 1.0 -  Alkaline Phos 38 - 126 U/L 129(H) 154(H) -  AST 15 - 41 U/L 68(H) 48(H) -  ALT 17 - 63 U/L 35 34 -    CBC Latest Ref Rng & Units 10/01/2016 09/30/2016 09/28/2016  WBC 4.0 - 10.5 K/uL 17.3(H) 15.9(H) 19.8(H)  Hemoglobin 13.0 - 17.0 g/dL 14.3 14.5 14.2   Hematocrit 39.0 - 52.0 % 39.7 41.0 41.9  Platelets 150 - 400 K/uL 310 311 355    ABG    Component Value Date/Time   PHART 7.284 (L) 09/30/2016 0400   PCO2ART 42.3 09/30/2016 0400   PO2ART 78.0 (L) 09/30/2016 0400   HCO3 19.4 (L) 09/30/2016 0400   TCO2 22 09/29/2016 0747   ACIDBASEDEF 6.1 (H) 09/30/2016 0400   O2SAT 92.6 09/30/2016 0400    CBG (last 3)   Recent Labs  10/23/16 0032 23-Oct-2016 0428 10-23-16 0810  GLUCAP 146* 150* 136*     Imaging No results found.  CULTURES: Urine 12/29 >>  Enetococcus faecalis and Citrobacter   Studies Doppler Rt leg 12/30 >> no DVT, technically limited study Renal US 12/31 >>> unable to do test due to body habitus Echo 1/02 >>> EF 55%  ANTIBIOTICS: Zosyn 12/29 >>> Vancomycin 12/31 >>>  LINES/TUBES: ETT 12/31>>> Left IJ CVL 12/31>>> Left radial aline 12/31>>>  EVENTS: 12/31 Cardiac arrest 01/01 Changed ETT 01/03 Family discussion >> no escalation of care with plan for comfort care once family arrives  DISCUSSION: 61 yo male admitted with hip/back pain, UTI, Rt leg cellulitis.  Developed MODS with septic shock after respiratory leading to cardiac arrest.  ASSESSMENT / PLAN:  Acute on chronic respiratory failure with  hypoxia/hypercapnic in setting of PNA, Rt pleural effusion, ARDS with presumed OSA/OHS. - continue full vent support until family arrives today. Then transition to comfort care.  Septic shock from UTI, Rt leg cellulitis, PNA. - pressors to keep MAP > 65 - day 7/7 of Abx  AKI. Hyponatremia. 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 - foley placed by urology 12/31 - defer additional lab testing  Hx of ETOH with cirrhosis. - thiamine, folic acid, rifaximin  DM type II. - SSI  Relative adrenal insufficiency >> cortisol 23 from 12/31. - added solu cortef  Acute hepatic encephalopathy. - RASS goal -3 to -4 - lactulose titrated for 2-3 BM  DVT prophylaxis - SQ heparin SUP -  protonix Nutrition - tube feeds  Goals of care - DNR.  No escalation of care.  Plan to transition to comfort care when family arrives.  Jacques Earthly, MD  Internal Medicine PGY-3 10/08/16, 10:31 AM Pager:  [phone] After 3pm call: [phone]

## 2016-10-30 NOTE — Discharge Summary (Signed)
PULMONARY / CRITICAL CARE MEDICINE  Name: Matthew [name] MRN: [id number] DOB: Nov 03, 1955 61 y.o.  Date of Admission: 09/17/2016 11:57 PM Date of Discharge: 10/07/2016 Attending Physician: Brand Males, MD  Discharge Diagnosis: 1. Cardiac Arrest 2. Acute on chronic respiratory failure with hypoxia/hypercapnea  3. Pneumonia 4. Acute Respiratory Distress Syndrome 5. Septic Shock 6. Urinary Tract Infection 7. Right Leg Cellulitis 8. Pneumonia 9. Acute Kidney Injury on Chronic Kidney Disease, stage 2 10. Hyponatremia 11. Cirrhosis 12. Diabetes Mellitus type 2  Cause of death: Pneumonia Time of death: 01-18-34  Disposition and follow-up:   Matthew [name] was discharged from [facility] in expired condition.    Hospital Course: 61 year old man with ETOH cirrhosis, morbid obesity, DM, HTN, A fib, OSA, CKD2, presented with right hip and lower back pain after fall 3 weeks prior to admission.  Also concern for UTI, Rt leg cellulitis, AKI.  Developed altered mental status, then respiratory arrest 12/31 leading to cardiac arrest with ROSC in 4 minutes.  Developed MODS after this event. He completed 7 days of vancomycin and Zosyn for his UTI, right leg cellulitis and pneumonia. Prognosis and goals of care were discussed with his son. The decision was made to terminally extubate the patient. He expired on 1/5.  Signed: Milagros Loll, MD 10/07/2016, 3:51 PM

## 2016-10-30 DEATH — deceased

## 2017-08-03 IMAGING — CR DG CHEST 1V PORT
1 series · 1 of 1 positions shown · non-contrast
Comparison: 05/23/2016 and 05/22/2016

CLINICAL DATA: Shortness of breath.  PICC placement.

EXAM:
PORTABLE CHEST 1 VIEW

[AP]
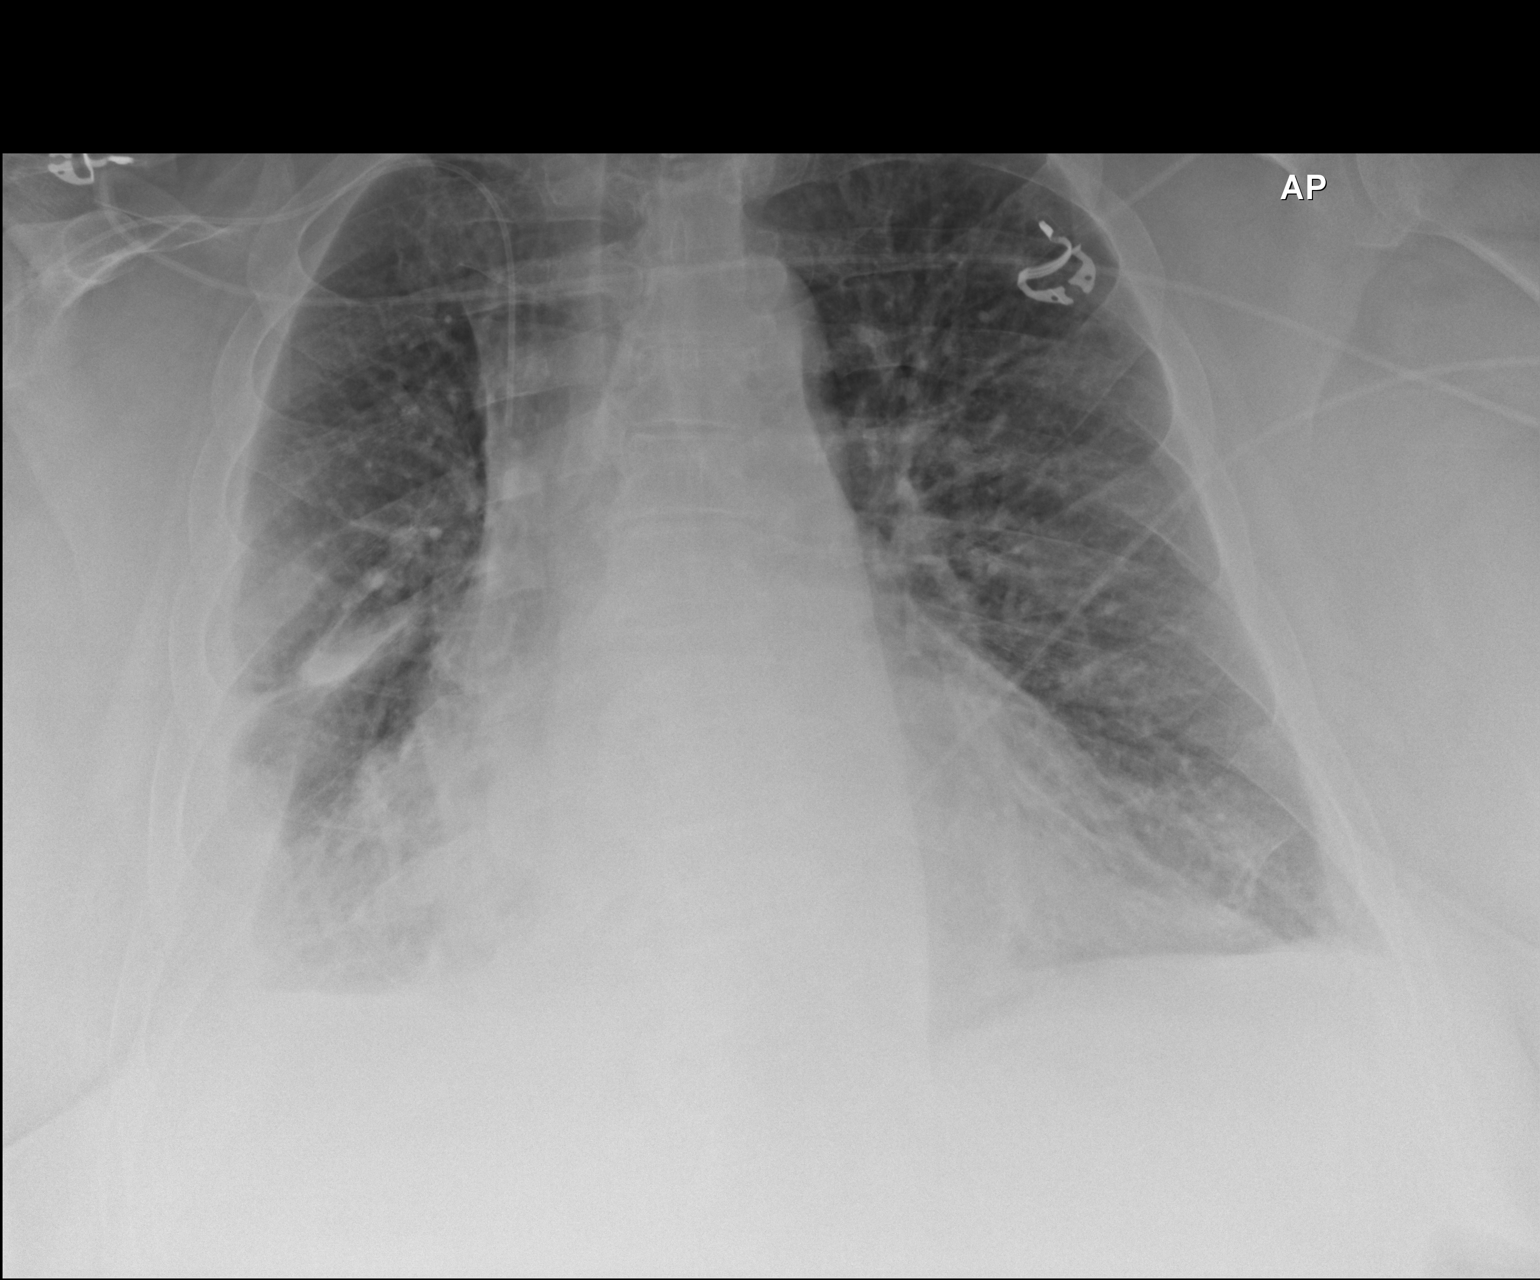

[1 of 1 positions shown; findings below may reference images not displayed]

FINDINGS: PICC tip is in the superior vena cava just above the carina in good
position. Decreased right pleural effusion. Almost complete
resolution of the atelectasis at the left lung base.

Heart size and pulmonary vascularity are normal.
IMPRESSION: PICC in good position.  Decreasing right effusion.

## 2017-10-07 IMAGING — US US RENAL
1 series · 14 of 25 positions shown · non-contrast
Comparison: 05/18/2016

CLINICAL DATA: MRSA infection.  MRSA in urine culture.

EXAM:
RENAL / URINARY TRACT ULTRASOUND COMPLETE

[Series 1: us renal · 0.27mm/px · 14 of 31 slices shown]
[im 1/31]
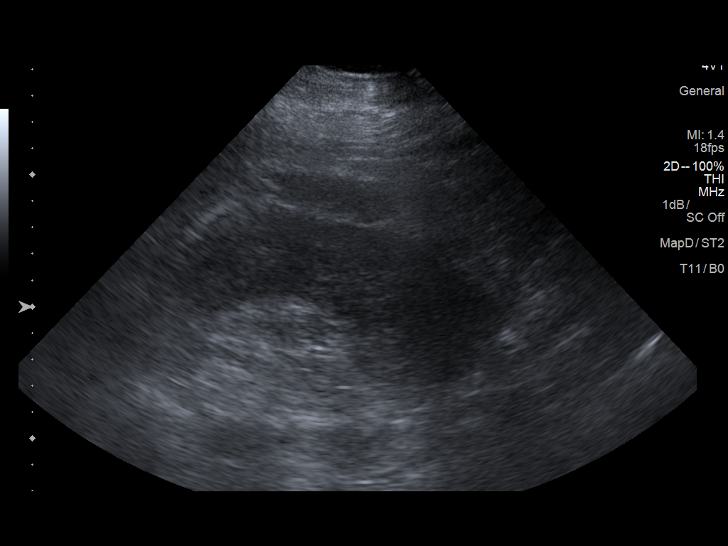
[im 3/31]
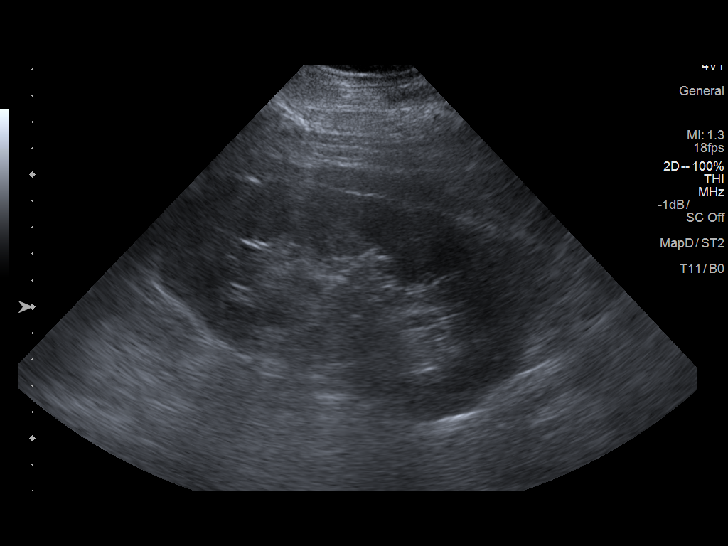
[im 6/31]
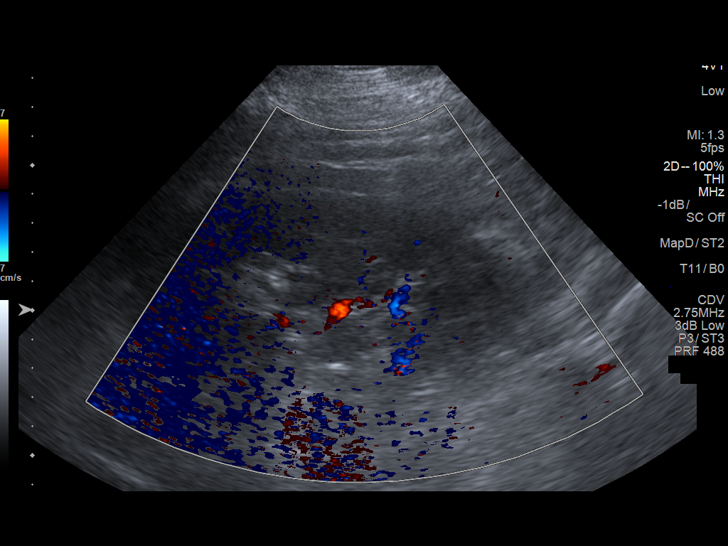
[im 8/31]
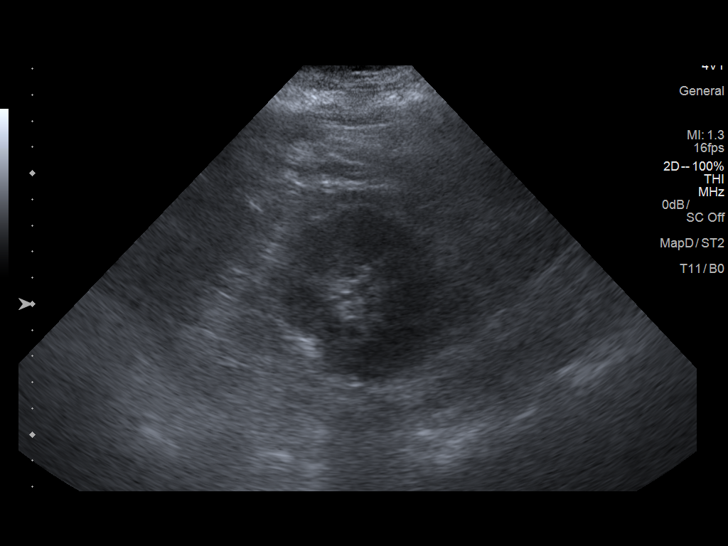
[im 11/31]
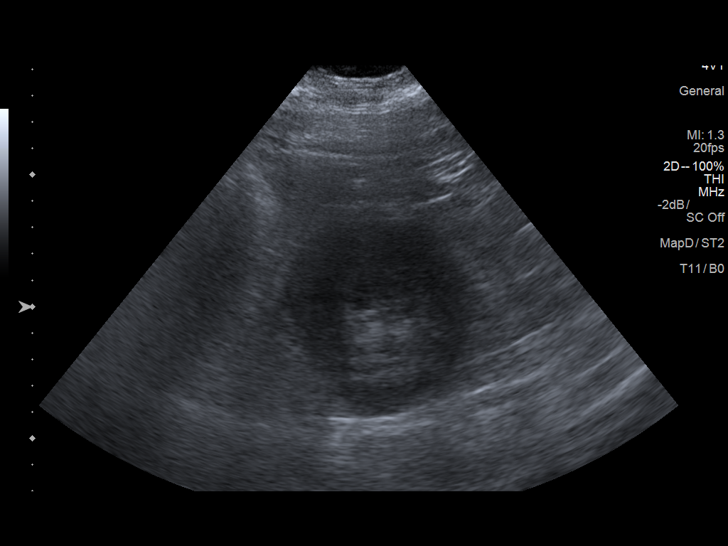
[im 12/31]
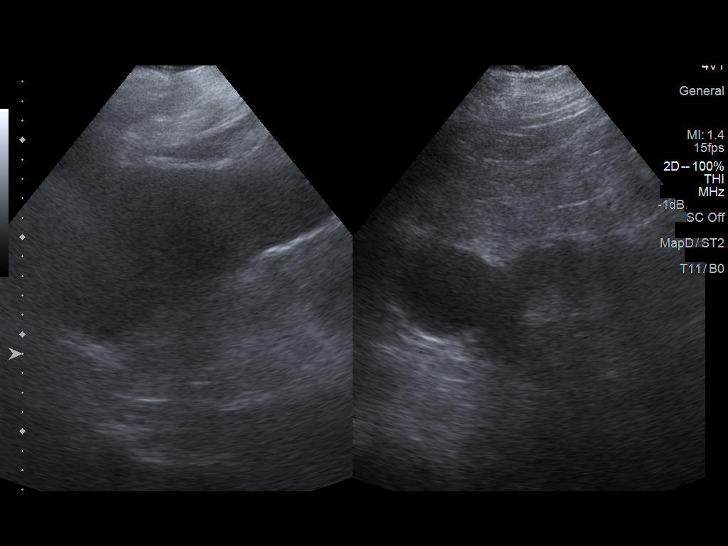
[im 14/31]
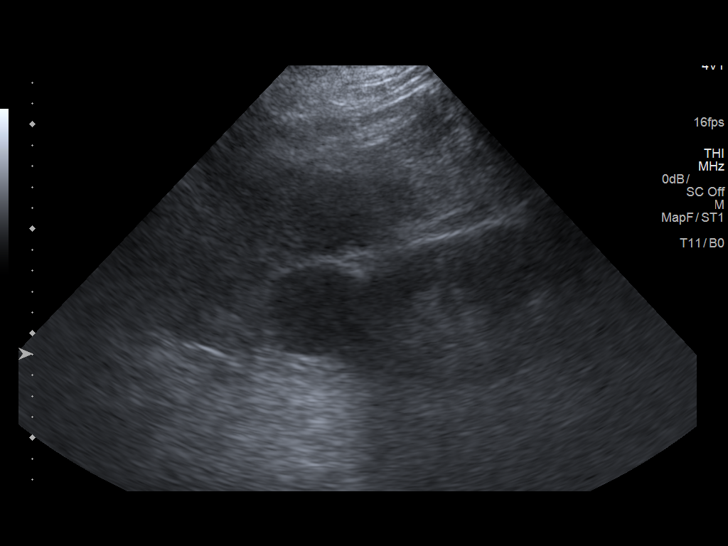
[im 17/31]
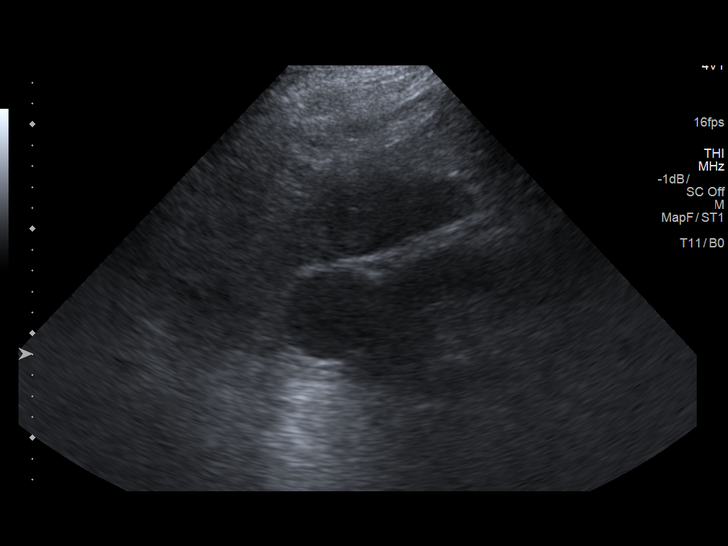
[im 19/31]
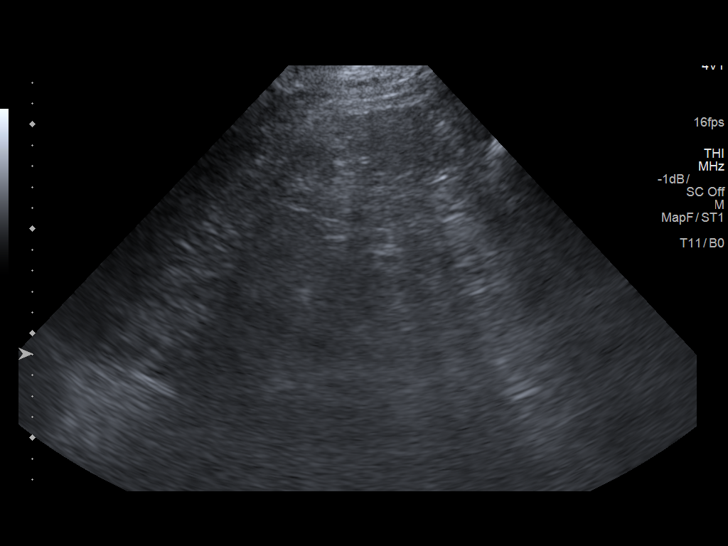
[im 21/31]
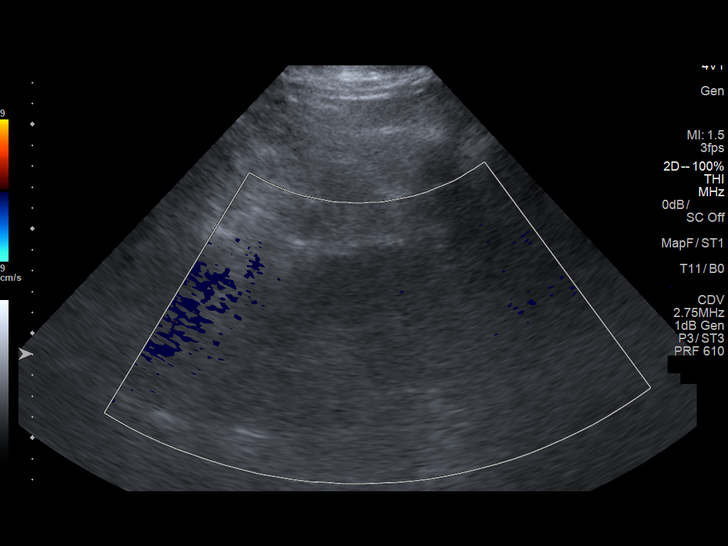
[im 23/31]
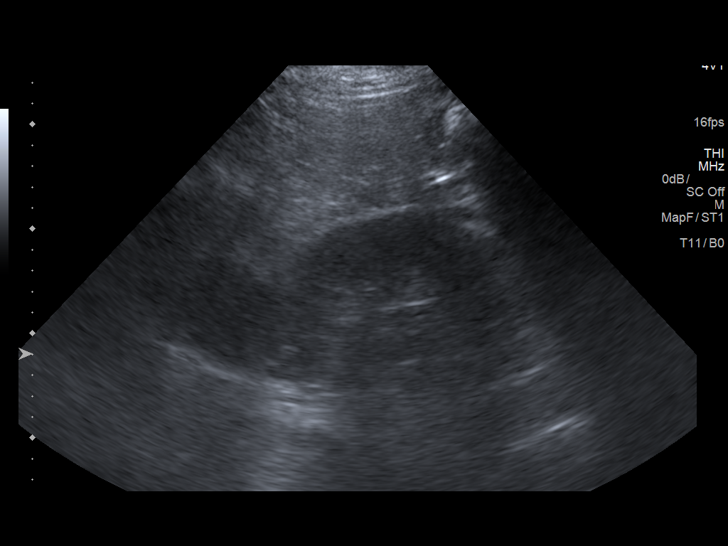
[im 26/31]
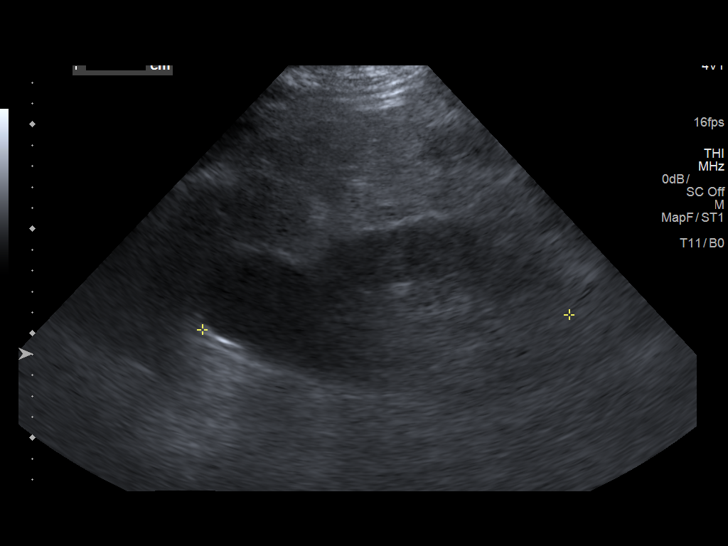
[im 28/31]
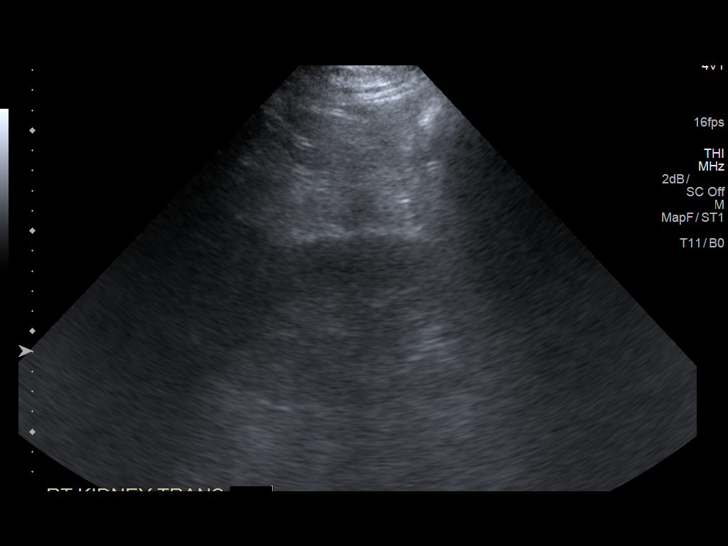
[im 31/31]
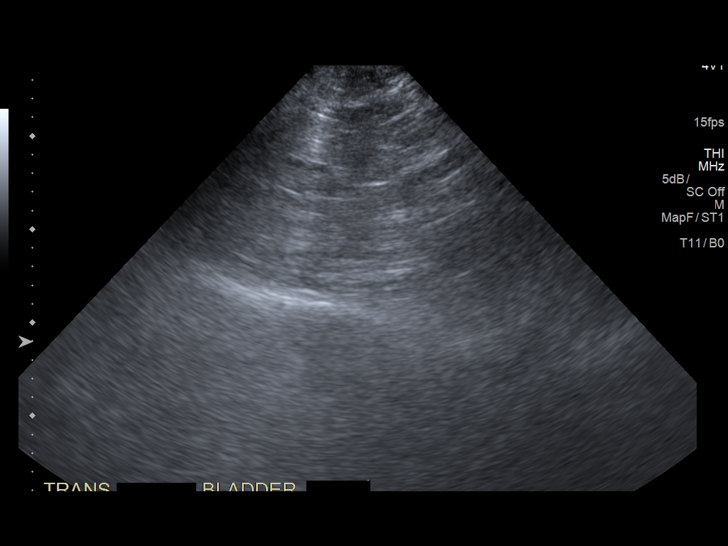

[14 of 25 positions shown; findings below may reference images not displayed]

FINDINGS: Right Kidney:

Length: 15.0 cm. Normal echotexture. No hydronephrosis. 5 cm cyst in
the upper pole. No hydronephrosis.

Left Kidney:

Length: 13.9 cm. Echogenicity within normal limits. No mass or
hydronephrosis visualized.

Bladder:

Not visualized, decompressed
IMPRESSION: No hydronephrosis.  No acute findings.

## 2017-12-07 IMAGING — CR DG CHEST 1V PORT
2 series · 2 of 2 positions shown · non-contrast
Comparison: 09/28/2016

CLINICAL DATA: Respiratory failure.  Morbid obesity.

EXAM:
PORTABLE CHEST 1 VIEW

[AP (1 of 2)]
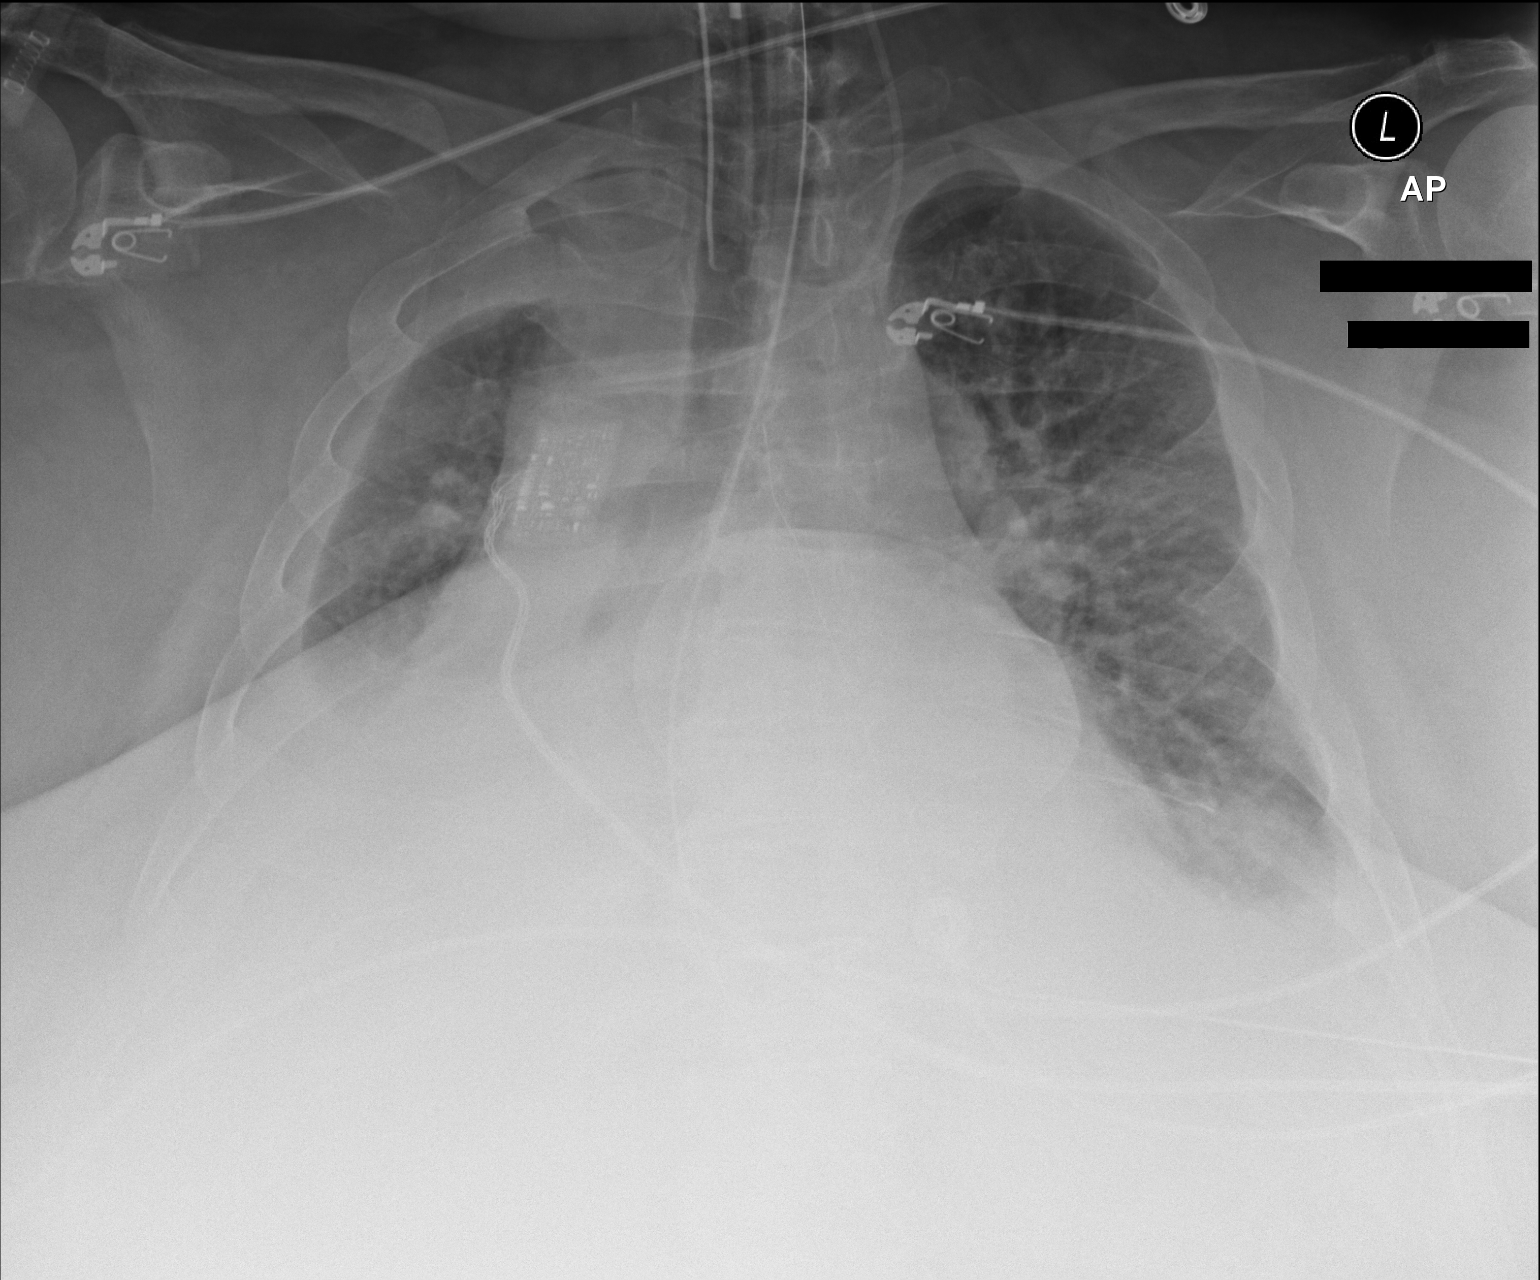

[AP (2 of 2)]
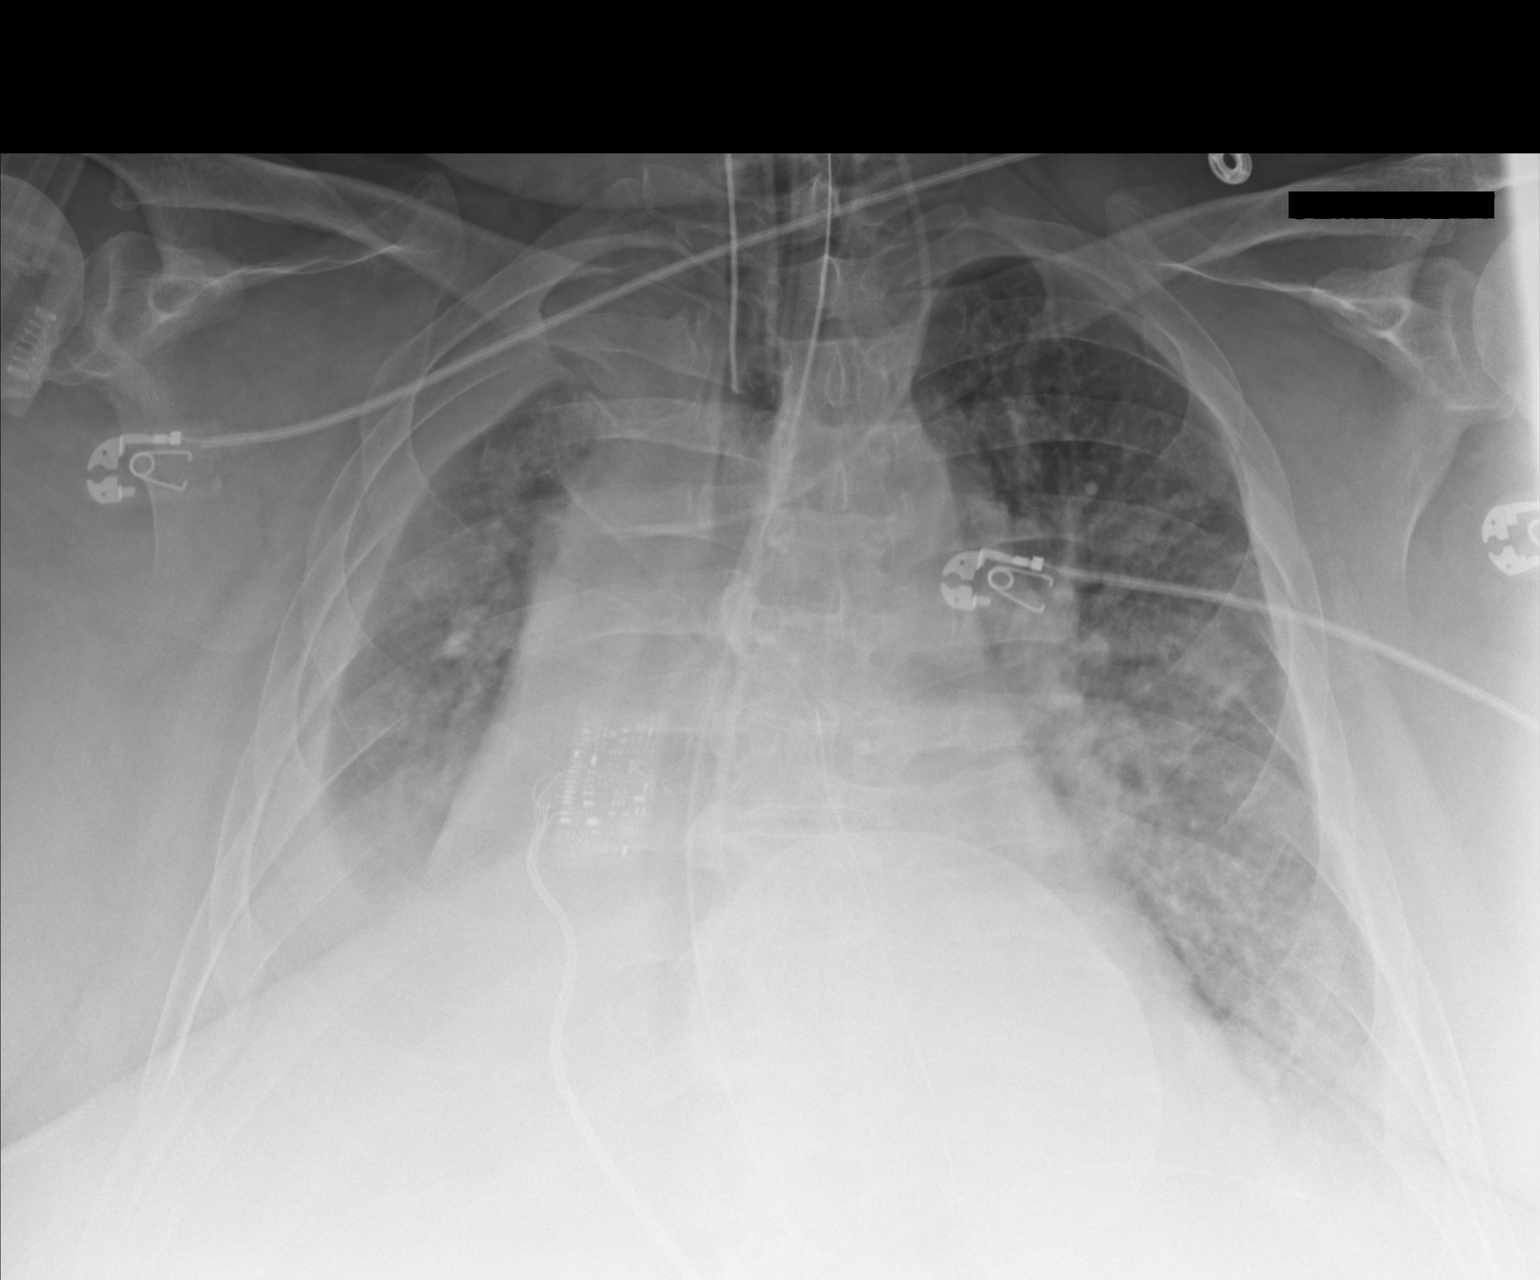

[2 of 2 positions shown; findings below may reference images not displayed]

FINDINGS: Endotracheal tube is 6.4 cm above the carina. Nasogastric tube is
off the film beyond the gastroesophageal junction. Left IJ central
line tip overlies the level of the brachiocephalic -SVC confluence.

The cardio pericardial silhouette is enlarged and stable in
appearance. There are bilateral pleural effusions. Effusion on the
right may be loculated. Interstitial changes suggest possible
pulmonary edema. Dense opacities obscure the hemidiaphragms
bilaterally and appear stable.
IMPRESSION: 1. Stable enlargement of the cardiac silhouette.
2. Stable appearance of bilateral pleural effusions, right greater
than left. Suspect right pleural effusion is loculated.
3. Mild pulmonary edema, and bilateral lower lobe infiltrates,
stable.

## 2017-12-08 IMAGING — CR DG CHEST 1V PORT
1 series · 1 of 1 positions shown · non-contrast
Comparison: 09/29/2016

CLINICAL DATA: Respiratory failure.

EXAM:
PORTABLE CHEST 1 VIEW

[AP]
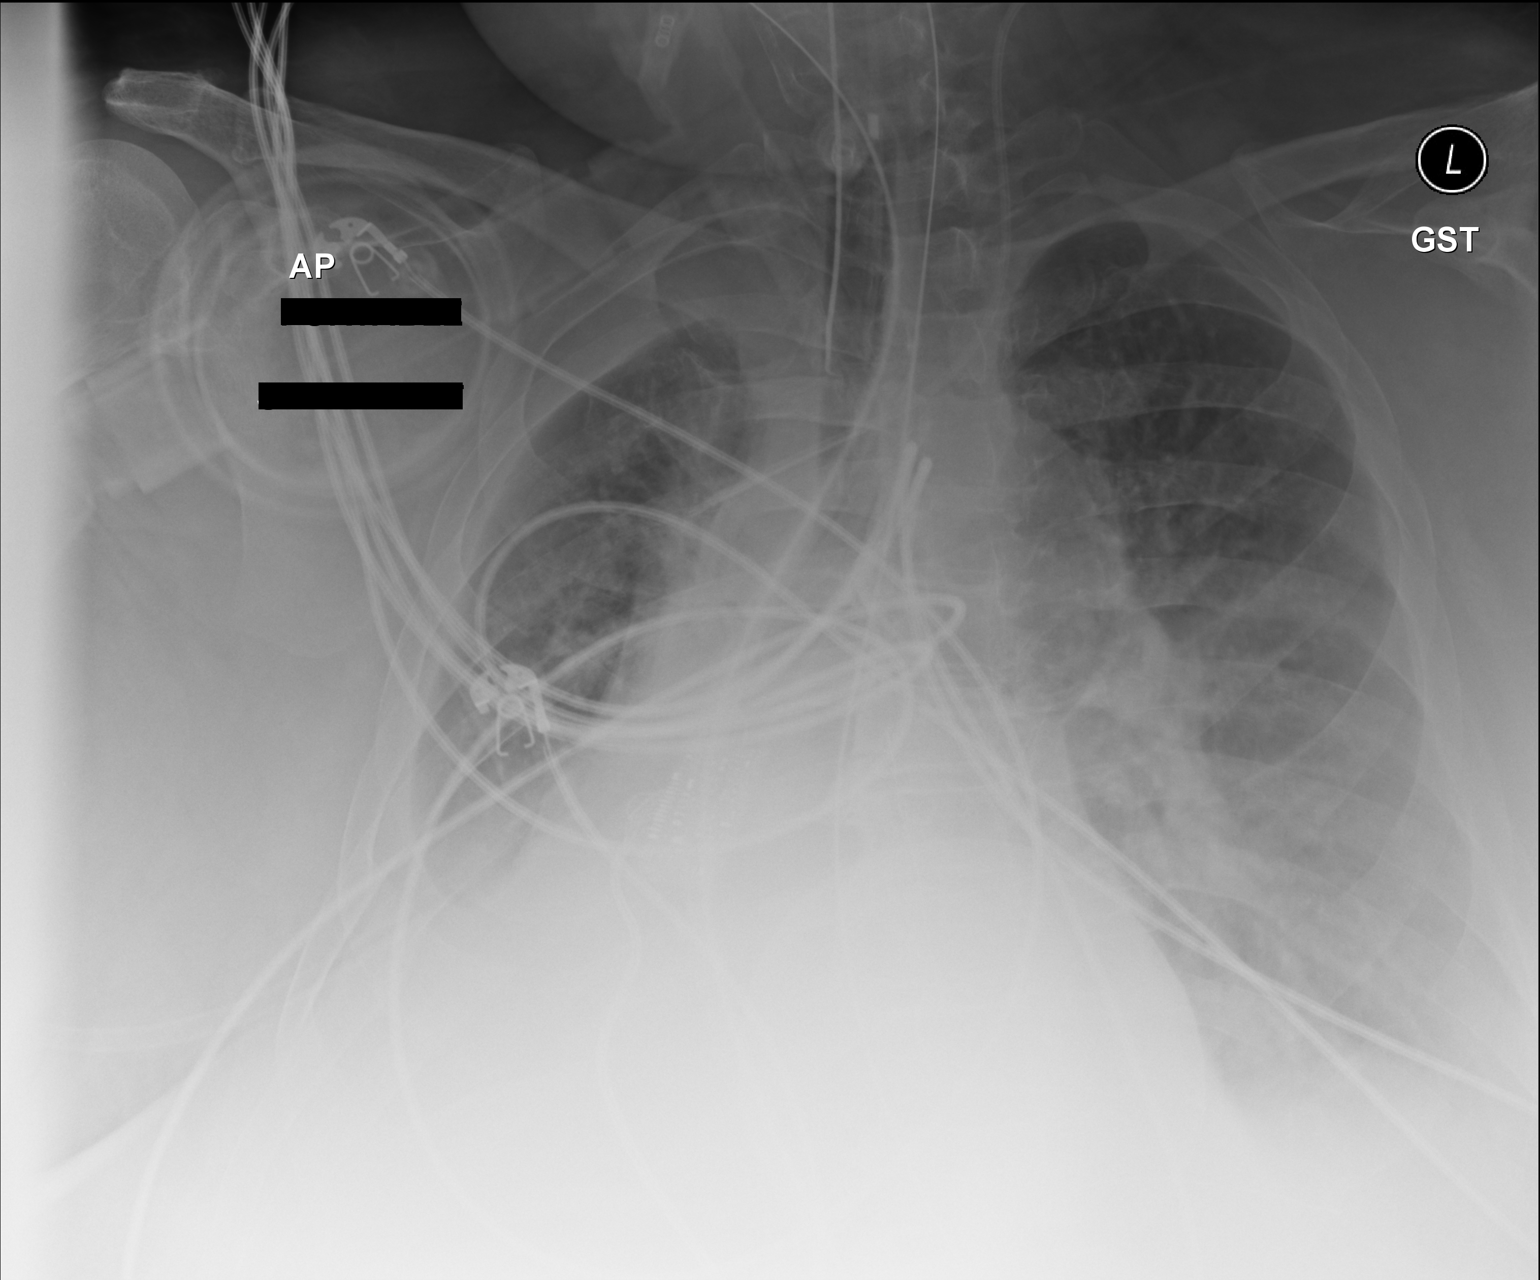

[1 of 1 positions shown; findings below may reference images not displayed]

FINDINGS: Endotracheal tube is 5.8 cm above the carina. Nasogastric tube
extends below the diaphragm and beyond the inferior edge of the
image. Left jugular central line extends into the SVC. Moderately
large right pleural effusion, probably unchanged. Central and
basilar airspace opacities, unchanged. No pneumothorax.
IMPRESSION: Support equipment appears satisfactorily positioned. No significant
interval change in the bilateral airspace opacities. Right pleural
effusion, unchanged.
